# Patient Record
Sex: Female | Born: 1950
Health system: Southern US, Community
[De-identification: ages and names within clinical notes are randomized; demographics above are authoritative.]

## PROBLEM LIST (undated history)

## (undated) DIAGNOSIS — E1142 Type 2 diabetes mellitus with diabetic polyneuropathy: Secondary | ICD-10-CM

## (undated) DIAGNOSIS — R609 Edema, unspecified: Secondary | ICD-10-CM

## (undated) DIAGNOSIS — R269 Unspecified abnormalities of gait and mobility: Secondary | ICD-10-CM

## (undated) DIAGNOSIS — N189 Chronic kidney disease, unspecified: Secondary | ICD-10-CM

## (undated) DIAGNOSIS — K148 Other diseases of tongue: Secondary | ICD-10-CM

## (undated) DIAGNOSIS — E11319 Type 2 diabetes mellitus with unspecified diabetic retinopathy without macular edema: Secondary | ICD-10-CM

## (undated) DIAGNOSIS — M21371 Foot drop, right foot: Secondary | ICD-10-CM

## (undated) DIAGNOSIS — R600 Localized edema: Secondary | ICD-10-CM

## (undated) DIAGNOSIS — G4730 Sleep apnea, unspecified: Secondary | ICD-10-CM

## (undated) DIAGNOSIS — M21372 Foot drop, left foot: Secondary | ICD-10-CM

## (undated) DIAGNOSIS — T884XXA Failed or difficult intubation, initial encounter: Secondary | ICD-10-CM

## (undated) DIAGNOSIS — K219 Gastro-esophageal reflux disease without esophagitis: Secondary | ICD-10-CM

## (undated) DIAGNOSIS — E119 Type 2 diabetes mellitus without complications: Secondary | ICD-10-CM

## (undated) HISTORY — DX: Foot drop, left foot: M21.372

## (undated) HISTORY — PX: HIP SURGERY: SHX245

## (undated) HISTORY — DX: Foot drop, right foot: M21.371

## (undated) HISTORY — DX: Type 2 diabetes mellitus with diabetic polyneuropathy: E11.42

## (undated) HISTORY — PX: ANKLE FRACTURE SURGERY: SHX122

## (undated) HISTORY — DX: Type 2 diabetes mellitus without complications: E11.9

## (undated) HISTORY — DX: Edema, unspecified: R60.9

## (undated) HISTORY — DX: Type 2 diabetes mellitus with unspecified diabetic retinopathy without macular edema: E11.319

## (undated) HISTORY — DX: Unspecified abnormalities of gait and mobility: R26.9

## (undated) HISTORY — PX: TONSILLECTOMY: SUR1361

## (undated) HISTORY — PX: CATARACT EXTRACTION, BILATERAL: SHX1313

## (undated) HISTORY — PX: ABDOMINAL HYSTERECTOMY: SHX81

## (undated) HISTORY — DX: Localized edema: R60.0

---

## 2001-04-15 ENCOUNTER — Other Ambulatory Visit: Admission: RE | Admit: 2001-04-15 | Discharge: 2001-04-15 | Payer: Self-pay | Admitting: Gynecology

## 2001-06-29 ENCOUNTER — Encounter: Admission: RE | Admit: 2001-06-29 | Discharge: 2001-09-27 | Payer: Self-pay | Admitting: *Deleted

## 2002-09-28 ENCOUNTER — Other Ambulatory Visit: Admission: RE | Admit: 2002-09-28 | Discharge: 2002-09-28 | Payer: Self-pay | Admitting: Gynecology

## 2003-09-26 ENCOUNTER — Other Ambulatory Visit: Admission: RE | Admit: 2003-09-26 | Discharge: 2003-09-26 | Payer: Self-pay | Admitting: Gynecology

## 2003-10-20 ENCOUNTER — Encounter: Admission: RE | Admit: 2003-10-20 | Discharge: 2003-10-20 | Payer: Self-pay | Admitting: Gynecology

## 2004-10-31 ENCOUNTER — Other Ambulatory Visit: Admission: RE | Admit: 2004-10-31 | Discharge: 2004-10-31 | Payer: Self-pay | Admitting: Obstetrics and Gynecology

## 2004-11-13 ENCOUNTER — Encounter: Admission: RE | Admit: 2004-11-13 | Discharge: 2004-11-13 | Payer: Self-pay | Admitting: Obstetrics and Gynecology

## 2005-10-24 ENCOUNTER — Inpatient Hospital Stay (HOSPITAL_COMMUNITY): Admission: EM | Admit: 2005-10-24 | Discharge: 2005-10-27 | Payer: Self-pay | Admitting: Emergency Medicine

## 2006-02-13 ENCOUNTER — Other Ambulatory Visit: Admission: RE | Admit: 2006-02-13 | Discharge: 2006-02-13 | Payer: Self-pay | Admitting: Obstetrics and Gynecology

## 2010-04-04 ENCOUNTER — Encounter: Admission: RE | Admit: 2010-04-04 | Discharge: 2010-04-04 | Payer: Self-pay | Admitting: Obstetrics and Gynecology

## 2010-04-23 ENCOUNTER — Encounter: Admission: RE | Admit: 2010-04-23 | Discharge: 2010-04-23 | Payer: Self-pay | Admitting: Internal Medicine

## 2010-09-07 ENCOUNTER — Encounter: Payer: Self-pay | Admitting: Gynecology

## 2011-02-11 ENCOUNTER — Inpatient Hospital Stay (HOSPITAL_COMMUNITY)
Admission: EM | Admit: 2011-02-11 | Discharge: 2011-02-18 | DRG: 818 | Disposition: A | Discharge status: Home Health | Payer: BC Managed Care – PPO | Source: Ambulatory Visit | Attending: Orthopaedic Surgery | Admitting: Orthopaedic Surgery

## 2011-02-11 ENCOUNTER — Emergency Department (HOSPITAL_COMMUNITY): Payer: BC Managed Care – PPO

## 2011-02-11 ENCOUNTER — Inpatient Hospital Stay (HOSPITAL_COMMUNITY): Payer: BC Managed Care – PPO

## 2011-02-11 DIAGNOSIS — Y990 Civilian activity done for income or pay: Secondary | ICD-10-CM

## 2011-02-11 DIAGNOSIS — S72033A Displaced midcervical fracture of unspecified femur, initial encounter for closed fracture: Principal | ICD-10-CM | POA: Diagnosis present

## 2011-02-11 DIAGNOSIS — E1149 Type 2 diabetes mellitus with other diabetic neurological complication: Secondary | ICD-10-CM | POA: Diagnosis present

## 2011-02-11 DIAGNOSIS — E1142 Type 2 diabetes mellitus with diabetic polyneuropathy: Secondary | ICD-10-CM | POA: Diagnosis present

## 2011-02-11 DIAGNOSIS — W010XXA Fall on same level from slipping, tripping and stumbling without subsequent striking against object, initial encounter: Secondary | ICD-10-CM | POA: Diagnosis present

## 2011-02-11 DIAGNOSIS — K219 Gastro-esophageal reflux disease without esophagitis: Secondary | ICD-10-CM | POA: Diagnosis present

## 2011-02-11 DIAGNOSIS — Y9269 Other specified industrial and construction area as the place of occurrence of the external cause: Secondary | ICD-10-CM

## 2011-02-11 LAB — DIFFERENTIAL
Basophils Absolute: 0.1 10*3/uL (ref 0.0–0.1)
Basophils Relative: 0 % (ref 0–1)
Eosinophils Absolute: 0.2 10*3/uL (ref 0.0–0.7)
Eosinophils Relative: 2 % (ref 0–5)
Lymphocytes Relative: 10 % — ABNORMAL LOW (ref 12–46)
Lymphs Abs: 1.4 10*3/uL (ref 0.7–4.0)
Monocytes Absolute: 0.8 10*3/uL (ref 0.1–1.0)
Monocytes Relative: 6 % (ref 3–12)
Neutro Abs: 11.4 10*3/uL — ABNORMAL HIGH (ref 1.7–7.7)
Neutrophils Relative %: 82 % — ABNORMAL HIGH (ref 43–77)

## 2011-02-11 LAB — CBC
HCT: 39.4 % (ref 36.0–46.0)
Hemoglobin: 13.7 g/dL (ref 12.0–15.0)
MCH: 31.7 pg (ref 26.0–34.0)
MCHC: 34.8 g/dL (ref 30.0–36.0)
MCV: 91.2 fL (ref 78.0–100.0)
Platelets: 331 10*3/uL (ref 150–400)
RBC: 4.32 MIL/uL (ref 3.87–5.11)
RDW: 12.6 % (ref 11.5–15.5)
WBC: 13.8 10*3/uL — ABNORMAL HIGH (ref 4.0–10.5)

## 2011-02-11 LAB — BASIC METABOLIC PANEL
BUN: 18 mg/dL (ref 6–23)
CO2: 27 mEq/L (ref 19–32)
Calcium: 9.2 mg/dL (ref 8.4–10.5)
Chloride: 100 mEq/L (ref 96–112)
Creatinine, Ser: 0.62 mg/dL (ref 0.50–1.10)
GFR calc Af Amer: >60 mL/min (ref >60)
GFR calc non Af Amer: >60 mL/min (ref >60)
Glucose, Bld: 182 mg/dL — ABNORMAL HIGH (ref 70–99)
Potassium: 4.7 mEq/L (ref 3.5–5.1)
Sodium: 136 mEq/L (ref 135–145)

## 2011-02-11 LAB — GLUCOSE, CAPILLARY
Glucose-Capillary: 132 mg/dL — ABNORMAL HIGH (ref 70–99)
Glucose-Capillary: 108 mg/dL — ABNORMAL HIGH (ref 70–99)
Glucose-Capillary: 166 mg/dL — ABNORMAL HIGH (ref 70–99)

## 2011-02-11 LAB — SAMPLE TO BLOOD BANK

## 2011-02-12 ENCOUNTER — Inpatient Hospital Stay (HOSPITAL_COMMUNITY): Payer: BC Managed Care – PPO

## 2011-02-12 LAB — BASIC METABOLIC PANEL
BUN: 15 mg/dL (ref 6–23)
CO2: 30 mEq/L (ref 19–32)
Calcium: 8.4 mg/dL (ref 8.4–10.5)
Chloride: 104 mEq/L (ref 96–112)
Creatinine, Ser: 0.75 mg/dL (ref 0.50–1.10)
GFR calc Af Amer: >60 mL/min (ref >60)
GFR calc non Af Amer: >60 mL/min (ref >60)
Glucose, Bld: 237 mg/dL — ABNORMAL HIGH (ref 70–99)
Potassium: 5.7 mEq/L — ABNORMAL HIGH (ref 3.5–5.1)
Sodium: 141 mEq/L (ref 135–145)

## 2011-02-12 LAB — GLUCOSE, CAPILLARY
Glucose-Capillary: 154 mg/dL — ABNORMAL HIGH (ref 70–99)
Glucose-Capillary: 192 mg/dL — ABNORMAL HIGH (ref 70–99)
Glucose-Capillary: 195 mg/dL — ABNORMAL HIGH (ref 70–99)
Glucose-Capillary: 203 mg/dL — ABNORMAL HIGH (ref 70–99)
Glucose-Capillary: 230 mg/dL — ABNORMAL HIGH (ref 70–99)

## 2011-02-12 LAB — CBC
HCT: 32 % — ABNORMAL LOW (ref 36.0–46.0)
Hemoglobin: 10.8 g/dL — ABNORMAL LOW (ref 12.0–15.0)
MCH: 31.4 pg (ref 26.0–34.0)
MCHC: 33.8 g/dL (ref 30.0–36.0)
MCV: 93 fL (ref 78.0–100.0)
Platelets: 283 10*3/uL (ref 150–400)
RBC: 3.44 MIL/uL — ABNORMAL LOW (ref 3.87–5.11)
RDW: 12.6 % (ref 11.5–15.5)
WBC: 13.5 10*3/uL — ABNORMAL HIGH (ref 4.0–10.5)

## 2011-02-12 LAB — PROTIME-INR
INR: 1.04 (ref 0.00–1.49)
Prothrombin Time: 13.8 seconds (ref 11.6–15.2)

## 2011-02-13 DIAGNOSIS — S72009A Fracture of unspecified part of neck of unspecified femur, initial encounter for closed fracture: Secondary | ICD-10-CM

## 2011-02-13 LAB — BASIC METABOLIC PANEL
BUN: 9 mg/dL (ref 6–23)
CO2: 29 mEq/L (ref 19–32)
Calcium: 7.4 mg/dL — ABNORMAL LOW (ref 8.4–10.5)
Chloride: 98 mEq/L (ref 96–112)
Creatinine, Ser: 0.63 mg/dL (ref 0.50–1.10)
GFR calc Af Amer: >60 mL/min (ref >60)
GFR calc non Af Amer: >60 mL/min (ref >60)
Glucose, Bld: 153 mg/dL — ABNORMAL HIGH (ref 70–99)
Potassium: 4.1 mEq/L (ref 3.5–5.1)
Sodium: 134 mEq/L — ABNORMAL LOW (ref 135–145)

## 2011-02-13 LAB — GLUCOSE, CAPILLARY
Glucose-Capillary: 187 mg/dL — ABNORMAL HIGH (ref 70–99)
Glucose-Capillary: 251 mg/dL — ABNORMAL HIGH (ref 70–99)
Glucose-Capillary: 226 mg/dL — ABNORMAL HIGH (ref 70–99)
Glucose-Capillary: 238 mg/dL — ABNORMAL HIGH (ref 70–99)

## 2011-02-13 LAB — CBC
HCT: 29.4 % — ABNORMAL LOW (ref 36.0–46.0)
Hemoglobin: 9.8 g/dL — ABNORMAL LOW (ref 12.0–15.0)
MCH: 30.6 pg (ref 26.0–34.0)
MCHC: 33.3 g/dL (ref 30.0–36.0)
MCV: 91.9 fL (ref 78.0–100.0)
Platelets: 264 10*3/uL (ref 150–400)
RBC: 3.2 MIL/uL — ABNORMAL LOW (ref 3.87–5.11)
RDW: 12.5 % (ref 11.5–15.5)
WBC: 10.7 10*3/uL — ABNORMAL HIGH (ref 4.0–10.5)

## 2011-02-13 LAB — PROTIME-INR
INR: 1.07 (ref 0.00–1.49)
Prothrombin Time: 14.1 seconds (ref 11.6–15.2)

## 2011-02-14 LAB — GLUCOSE, CAPILLARY
Glucose-Capillary: 184 mg/dL — ABNORMAL HIGH (ref 70–99)
Glucose-Capillary: 157 mg/dL — ABNORMAL HIGH (ref 70–99)
Glucose-Capillary: 132 mg/dL — ABNORMAL HIGH (ref 70–99)
Glucose-Capillary: 163 mg/dL — ABNORMAL HIGH (ref 70–99)
Glucose-Capillary: 139 mg/dL — ABNORMAL HIGH (ref 70–99)

## 2011-02-14 LAB — PROTIME-INR
INR: 1.21 (ref 0.00–1.49)
Prothrombin Time: 15.6 seconds — ABNORMAL HIGH (ref 11.6–15.2)

## 2011-02-15 LAB — GLUCOSE, CAPILLARY
Glucose-Capillary: 139 mg/dL — ABNORMAL HIGH (ref 70–99)
Glucose-Capillary: 118 mg/dL — ABNORMAL HIGH (ref 70–99)
Glucose-Capillary: 140 mg/dL — ABNORMAL HIGH (ref 70–99)
Glucose-Capillary: 164 mg/dL — ABNORMAL HIGH (ref 70–99)

## 2011-02-15 LAB — PROTIME-INR
INR: 1.44 (ref 0.00–1.49)
Prothrombin Time: 17.8 seconds — ABNORMAL HIGH (ref 11.6–15.2)

## 2011-02-15 NOTE — Op Note (Signed)
Grace [name], Grace [name]            ACCOUNT NO.:  [id number]  MEDICAL RECORD NO.:  [id number]  LOCATION:  5034                         FACILITY:  [facility]  PHYSICIAN:  Vanita Panda. Magnus Ivan, M.D.DATE OF BIRTH:  April 14, 1951  DATE OF PROCEDURE:  02/11/2011 DATE OF DISCHARGE:                              OPERATIVE REPORT   PREOPERATIVE DIAGNOSIS:  Right hip femoral neck fracture.  POSTOPERATIVE DIAGNOSIS:  Right hip femoral neck fracture.  PROCEDURE:  Right hip unipolar hemiarthroplasty.  IMPLANTS:  DePuy Summit Basic Press-Fit stem size 3, size 43, -3 hip ball.  SURGEON:  Vanita Panda. Magnus Ivan, MD  ANESTHESIA:  General.  ANTIBIOTICS:  2 g IV Ancef.  BLOOD LOSS:  300 mL.  COMPLICATIONS:  None.  INDICATIONS:  [name] is a 60 year old patient with diabetes and peripheral neuropathy, this is quite severe.  She ambulates with the walker.  Today, she had some insensate aspect of issues with her feet, but was ambulating with the walker and sustained a mechanical fall when she tripped accidently.  She had the inability to bear weight and was brought to the [facility] emergency room and found to have a femoral neck fracture of her right hip.  I recommend that she undergo a hemiarthroplasty.  She had had no prior hip disease at all and no hip pain, and I felt with her such a low mobility with her severe neuropathy and fall risk that we ought to try a hemiarthroplasty instead.  I explained the risk and benefits of this to her in detail and she did wish to proceed with surgery.  PROCEDURE DESCRIPTION:  After informed consent was obtained, appropriate right hip was marked.  She was brought to the operating room, placed supine on the operating table.  General anesthesia was then obtained. She was then turned into a lateral decubitus position with the right hip up.  An axillary roll was placed __________ and hip positioners in the front and the back.  Her right hip was then  prepped and draped with DuraPrep and sterile drapes.  A time-out was called and she was identified as the correct patient and the correct right hip.  I then made the incision directly over the greater trochanter and carried this proximally and distally.  I dissected down to the iliotibial band and divided this longitudinally and Charnley retractors were placed, and then I proceed with an anterolateral approach.  I took down two thirds of the gluteus medius and minimus tendons off of the greater trochanter and reflected these anteriorly.  I divided the hip capsule and found a femoral neck fracture.  I then made a femoral neck cut proximal to the lesser trochanter, and then I removed remnants of the neck and then head without difficulty.  I cleaned the acetabulum of debris, and then I trialed a size 43 head and a 44.  The 44 was too loose and the 43 was nice and snug.  Attention was then turned to the femur.  With hip flexed and externally rotated and brought off the front of the table into leg bag, I used the initiating reamer to enter the canal, the canal-finding guide and then a  lateralizing reamer.  I then  broached from a size 1 broach up to size 3.  The calcar planed off a 3 broach and then I trialed a size 43, -3 head due to difficulty with reducing a length higher.  I reduced this into the acetabulum and I thought her leg lengths felt  near equal.  The hip was stable with no shuck.  I then removed the trial instrumentation and replaced with the real Summit Press-Fit 3 stem with a collar and the real unipolar 43, -3 head.  We reduced this in acetabulum as well.  I copiously the irrigated the tissues and closed the joint capsule with interrupted #1 Ethibond suture, followed by reapproximating the gluteus and medius to the greater trochanter with #1 Ethibond.  I closed iliotibial band with interrupted #1 Vicryl followed by 2-0 Vicryl in subcutaneous tissue and staples on the skin,  Xeroform and Mepilex dressing.  The patient was rolled into supine position.  Her leg lengths were near equal.  She was awakened, extubated, and taken to recovery room in stable condition. All final counts were correct and there were no complications noted.     Vanita Panda. Magnus Ivan, M.D.     CYB/MEDQ  D:  02/11/2011  T:  02/12/2011  Job:  [id number]  Electronically Signed by Doneen Poisson M.D. on 02/15/2011 05:11:00 PM

## 2011-02-16 LAB — GLUCOSE, CAPILLARY
Glucose-Capillary: 216 mg/dL — ABNORMAL HIGH (ref 70–99)
Glucose-Capillary: 152 mg/dL — ABNORMAL HIGH (ref 70–99)
Glucose-Capillary: 75 mg/dL (ref 70–99)
Glucose-Capillary: 96 mg/dL (ref 70–99)

## 2011-02-16 LAB — PROTIME-INR
INR: 1.93 — ABNORMAL HIGH (ref 0.00–1.49)
Prothrombin Time: 22.4 seconds — ABNORMAL HIGH (ref 11.6–15.2)

## 2011-02-17 LAB — GLUCOSE, CAPILLARY
Glucose-Capillary: 93 mg/dL (ref 70–99)
Glucose-Capillary: 98 mg/dL (ref 70–99)
Glucose-Capillary: 62 mg/dL — ABNORMAL LOW (ref 70–99)
Glucose-Capillary: 174 mg/dL — ABNORMAL HIGH (ref 70–99)

## 2011-02-17 LAB — PROTIME-INR
INR: 4.45 — ABNORMAL HIGH (ref 0.00–1.49)
Prothrombin Time: 43 seconds — ABNORMAL HIGH (ref 11.6–15.2)

## 2011-02-18 LAB — GLUCOSE, CAPILLARY
Glucose-Capillary: 121 mg/dL — ABNORMAL HIGH (ref 70–99)
Glucose-Capillary: 149 mg/dL — ABNORMAL HIGH (ref 70–99)

## 2011-02-18 LAB — PROTIME-INR
INR: 4.97 — ABNORMAL HIGH (ref 0.00–1.49)
Prothrombin Time: 46.9 seconds — ABNORMAL HIGH (ref 11.6–15.2)

## 2011-03-06 NOTE — Discharge Summary (Signed)
  Grace [name], Grace [name]            ACCOUNT NO.:  [id number]  MEDICAL RECORD NO.:  [id number]  LOCATION:  5034                         FACILITY:  [facility]  PHYSICIAN:  Vanita Panda. Magnus Ivan, M.D.DATE OF BIRTH:  1950/09/08  DATE OF ADMISSION:  02/11/2011 DATE OF DISCHARGE:  02/18/2011                              DISCHARGE SUMMARY   ADMITTING DIAGNOSIS:  Right hip femoral neck fracture.  DISCHARGE DIAGNOSIS:  Status post right hip hemiarthroplasty.  HOSPITAL COURSE:  Grace [name] is a patient with peripheral neuropathy who on the day of admission had a mechanical fall when she tripped over a walker.  She was found to have a right hip femoral neck fracture.  She was admitted to the [facility]opedic [facility] and taken to the operating room on February 11, 2011, where she underwent a right hip hemiarthroplasty.  She was admitted as an inpatient to Orthopedic Floor and postoperative course was uneventful other than very slow mobility. We worked on trying to get her set up for inpatient rehabilitation to the hospital, but her insurance wound not clear her for this.  They recommended skilled nursing facility, but by the day of discharge the family wanted to be able to set her up for home health therapy to discharge her to home.  On the day of discharge, her incision was clean, dry, and intact.  She was afebrile with stable vital signs and it was felt she could go home with family with close assistance.  DISPOSITION:  Discharge to home.  DISCHARGE MEDICATIONS: 1. Percocet for pain. 2. Robaxin. 3. Coumadin. 4. Continue all same home medicines on home medical reconciliation     notes.  DISCHARGE INSTRUCTIONS:  While she is at home, she will get home health PT and OT and follow up in the office in 2 weeks.  She can get her incision wet in the shower.     Vanita Panda. Magnus Ivan, M.D.     CYB/MEDQ  D:  03/02/2011  T:  03/03/2011  Job:  [id number]  Electronically Signed by  Doneen Poisson M.D. on 03/06/2011 05:26:46 PM

## 2012-04-30 ENCOUNTER — Other Ambulatory Visit: Payer: Self-pay | Admitting: Internal Medicine

## 2012-04-30 DIAGNOSIS — Z1231 Encounter for screening mammogram for malignant neoplasm of breast: Secondary | ICD-10-CM

## 2012-05-26 ENCOUNTER — Ambulatory Visit
Admission: RE | Admit: 2012-05-26 | Discharge: 2012-05-26 | Disposition: A | Discharge status: Home / Self Care | Payer: BC Managed Care – PPO | Source: Ambulatory Visit | Attending: Internal Medicine | Admitting: Internal Medicine

## 2012-05-26 DIAGNOSIS — Z1231 Encounter for screening mammogram for malignant neoplasm of breast: Secondary | ICD-10-CM

## 2013-06-04 ENCOUNTER — Other Ambulatory Visit: Payer: Self-pay | Admitting: Neurology

## 2013-06-07 ENCOUNTER — Other Ambulatory Visit: Payer: Self-pay | Admitting: Neurology

## 2013-06-08 NOTE — Telephone Encounter (Signed)
DULoxetine (CYMBALTA) 30 MG capsule 30 capsule 0 06/04/2013     Sig: TAKE 1 CAPSULE EVERY DAY    Notes to Pharmacy: PLEASE SCHEDULE APPT    E-Prescribing Status: Receipt confirmed by pharmacy (06/07/2013 7:46 AM EDT)                Pharmacy    [id number] - [place],  - [address]

## 2013-06-09 ENCOUNTER — Other Ambulatory Visit: Payer: Self-pay | Admitting: Neurology

## 2013-07-20 ENCOUNTER — Encounter: Payer: Self-pay | Admitting: Neurology

## 2013-07-28 ENCOUNTER — Encounter (INDEPENDENT_AMBULATORY_CARE_PROVIDER_SITE_OTHER): Payer: Self-pay

## 2013-07-28 ENCOUNTER — Ambulatory Visit (INDEPENDENT_AMBULATORY_CARE_PROVIDER_SITE_OTHER): Payer: Medicare PPO | Admitting: Neurology

## 2013-07-28 ENCOUNTER — Encounter: Payer: Self-pay | Admitting: Neurology

## 2013-07-28 VITALS — BP 135/66 | HR 72 | Wt 183.5 lb

## 2013-07-28 DIAGNOSIS — R269 Unspecified abnormalities of gait and mobility: Secondary | ICD-10-CM

## 2013-07-28 DIAGNOSIS — E1142 Type 2 diabetes mellitus with diabetic polyneuropathy: Secondary | ICD-10-CM

## 2013-07-28 HISTORY — DX: Type 2 diabetes mellitus with diabetic polyneuropathy: E11.42

## 2013-07-28 HISTORY — DX: Unspecified abnormalities of gait and mobility: R26.9

## 2013-07-28 MED ORDER — DULOXETINE HCL 30 MG PO CPEP: 30.0000 mg | ORAL_CAPSULE | Freq: Every day | ORAL | Status: DC

## 2013-07-28 NOTE — Patient Instructions (Signed)

## 2013-07-28 NOTE — Progress Notes (Signed)
Reason for visit: Peripheral neuropathy  Grace [name] is an 62 y.o. female  History of present illness:  Grace [name] is a 62 year old right-handed white female with a history of diabetes and a diabetic peripheral neuropathy. The patient has bilateral foot drops, and she has bilateral AFO braces that have been beneficial in helping her ability to walk and helping her balance. The patient has not had any issues with falling, and she denies any significant problems with pain. The patient is on low-dose Cymbalta taking 30 mg daily. The patient recently has been treated for a diabetic retinopathy, and she is getting laser treatments for this. The patient has not been able to operate a motor vehicle regularly because of her vision, not because of her neuropathy. The patient does have some fatigue with walking at times. The patient indicates that she is sleeping well, and she is not having a lot of pain with her feet at night. The patient senses some numbness up to the knees bilaterally. The patient will occasionally have some hand numbness. The patient returns to the office today for an evaluation.  Past Medical History  Diagnosis Date  . Diabetes   . Peripheral edema   . Diabetic peripheral neuropathy   . Diabetic retinopathy   . Polyneuropathy in diabetes(357.2) 07/28/2013  . Abnormality of gait 07/28/2013    Past Surgical History  Procedure Laterality Date  . Tonsillectomy    . Abdominal hysterectomy    . Hip surgery      right  . Ankle fracture surgery      left    Family History  Problem Relation Age of Onset  . Diabetes Mother   . Diabetes Brother   . Neuropathy Neg Hx     Social history:  reports that she has never smoked. She has never used smokeless tobacco. She reports that she does not drink alcohol or use illicit drugs.   No Known Allergies  Medications:  Current Outpatient Prescriptions on File Prior to Visit  Medication Sig Dispense Refill  .  Cholecalciferol (VITAMIN D-3) 1000 UNITS CAPS Take by mouth.      Marland Kitchen glimepiride (AMARYL) 2 MG tablet Take 2 mg by mouth daily with breakfast.      . metFORMIN (GLUCOPHAGE) 500 MG tablet Take by mouth 2 (two) times daily with a meal.      . docusate sodium (COLACE) 100 MG capsule Take 100 mg by mouth 2 (two) times daily.       No current facility-administered medications on file prior to visit.    ROS:  Out of a complete 14 system review of symptoms, the patient complains only of the following symptoms, and all other reviewed systems are negative.  Light sensitivity Loss of vision Snoring  Blood pressure 135/66, pulse 72, weight 183 lb 8 oz (83.235 kg).  Physical Exam  General: The patient is alert and cooperative at the time of the examination. The patient is moderately obese.  Skin: No significant peripheral edema is noted.   Neurologic Exam  Mental status: The patient is oriented x 3.  Cranial nerves: Facial symmetry is present. Speech is normal, no aphasia or dysarthria is noted. Extraocular movements are full. Visual fields are full.  Motor: The patient has good strength in all 4 extremities, with exception that the patient has bilateral foot drops. The patient wears AFO braces bilaterally.  Sensory examination: Soft touch sensation on the arms and face is symmetric.  Coordination: The patient has good  finger-nose-finger and heel-to-shin bilaterally.  Gait and station: The patient has a slightly wide-based gait, the patient will slap the right foot some with walking. Tandem gait is unsteady. Romberg is negative. No drift is seen.  Reflexes: Deep tendon reflexes are symmetric, but are depressed.   Assessment/Plan:  One. Diabetic peripheral neuropathy  2. Gait disturbance  The patient is doing relatively well at this point, we will continue the Cymbalta for now, a prescription was written. The patient will followup in one year. The patient will contact our office if  needed.  Grace Palau MD 07/30/2013 9:49 AM  [facility] [address] [place], [place] [address]  Phone [phone] Fax [phone]

## 2013-12-09 ENCOUNTER — Other Ambulatory Visit: Payer: Self-pay | Admitting: Internal Medicine

## 2013-12-09 DIAGNOSIS — R0989 Other specified symptoms and signs involving the circulatory and respiratory systems: Secondary | ICD-10-CM

## 2013-12-13 ENCOUNTER — Encounter (INDEPENDENT_AMBULATORY_CARE_PROVIDER_SITE_OTHER): Payer: Self-pay

## 2013-12-13 ENCOUNTER — Ambulatory Visit
Admission: RE | Admit: 2013-12-13 | Discharge: 2013-12-13 | Disposition: A | Discharge status: Home / Self Care | Payer: Medicare HMO | Source: Ambulatory Visit | Attending: Internal Medicine | Admitting: Internal Medicine

## 2013-12-13 DIAGNOSIS — R0989 Other specified symptoms and signs involving the circulatory and respiratory systems: Secondary | ICD-10-CM

## 2014-02-21 HISTORY — PX: CATARACT EXTRACTION W/PHACO: SHX586

## 2014-04-10 HISTORY — PX: CATARACT EXTRACTION W/PHACO: SHX586

## 2014-07-31 ENCOUNTER — Ambulatory Visit (INDEPENDENT_AMBULATORY_CARE_PROVIDER_SITE_OTHER): Payer: Medicare PPO | Admitting: Neurology

## 2014-07-31 ENCOUNTER — Encounter: Payer: Self-pay | Admitting: Neurology

## 2014-07-31 VITALS — BP 128/62 | HR 71 | Ht 63.0 in | Wt 171.4 lb

## 2014-07-31 DIAGNOSIS — E1142 Type 2 diabetes mellitus with diabetic polyneuropathy: Secondary | ICD-10-CM

## 2014-07-31 DIAGNOSIS — M21372 Foot drop, left foot: Secondary | ICD-10-CM

## 2014-07-31 DIAGNOSIS — R269 Unspecified abnormalities of gait and mobility: Secondary | ICD-10-CM

## 2014-07-31 DIAGNOSIS — M21371 Foot drop, right foot: Secondary | ICD-10-CM

## 2014-07-31 HISTORY — DX: Foot drop, right foot: M21.371

## 2014-07-31 HISTORY — DX: Foot drop, right foot: M21.372

## 2014-07-31 MED ORDER — DULOXETINE HCL 30 MG PO CPEP
30.0000 mg | ORAL_CAPSULE | Freq: Every day | ORAL | Status: DC
Start: 2014-07-31 — End: 2014-08-30

## 2014-07-31 NOTE — Progress Notes (Signed)
Reason for visit: Diabetic peripheral neuropathy  Grace [name] is an 63 y.o. female  History of present illness:  Ms. Efaw is a 63 year old right-handed white female with a history of diabetes with a diabetic peripheral neuropathy. The patient has done well with her balance, she has not had any falls since last seen. She has bilateral foot drops, and she wears AFO braces bilaterally. The patient indicates that her blood sugars recently have been under good control. She is changing primary care physicians. The patient has not noted any new medical issues that have come up since last seen. She is on a low dose of Cymbalta, taking 30 mg daily. She indicates that the pain level is under good control on this dose. She is tolerating the Cymbalta well.  Past Medical History  Diagnosis Date  . Diabetes   . Peripheral edema   . Diabetic peripheral neuropathy   . Diabetic retinopathy   . Polyneuropathy in diabetes(357.2) 07/28/2013  . Abnormality of gait 07/28/2013  . Foot drop, bilateral 07/31/2014    Past Surgical History  Procedure Laterality Date  . Tonsillectomy    . Abdominal hysterectomy    . Hip surgery      right  . Ankle fracture surgery      left  . Cataract extraction, bilateral      Family History  Problem Relation Age of Onset  . Diabetes Mother   . Diabetes Brother   . Neuropathy Neg Hx     Social history:  reports that she has never smoked. She has never used smokeless tobacco. She reports that she does not drink alcohol or use illicit drugs.    Allergies  Allergen Reactions  . Gabapentin Swelling    Swelling in legs and feet    Medications:  Current Outpatient Prescriptions on File Prior to Visit  Medication Sig Dispense Refill  . glimepiride (AMARYL) 2 MG tablet Take 2 mg by mouth daily with breakfast.    . metFORMIN (GLUCOPHAGE) 500 MG tablet Take by mouth 2 (two) times daily with a meal.     No current facility-administered medications on  file prior to visit.    ROS:  Out of a complete 14 system review of symptoms, the patient complains only of the following symptoms, and all other reviewed systems are negative.  Loss of vision Muscle cramps, walking difficulties Numbness  Blood pressure 128/62, pulse 71, height 5\' 3"  (1.6 m), weight 171 lb 6.4 oz (77.747 kg).  Physical Exam  General: The patient is alert and cooperative at the time of the examination. The patient is moderately obese.  Skin: No significant peripheral edema is noted.   Neurologic Exam  Mental status: The patient is oriented x 3.  Cranial nerves: Facial symmetry is present. Speech is normal, no aphasia or dysarthria is noted. Extraocular movements are full. Visual fields are full.  Motor: The patient has good strength in all 4 extremities.  Sensory examination: Soft touch sensation on arms and face is symmetric.  Coordination: The patient has good finger-nose-finger and heel-to-shin bilaterally.  Gait and station: The patient has a normal gait. Tandem gait is slightly unsteady. Romberg is negative. No drift is seen.  Reflexes: Deep tendon reflexes are symmetric, but are depressed.   Assessment/Plan:  1. Diabetes  2. Diabetic peripheral neuropathy  3. Bilateral foot drops  4. Gait disorder  The patient is well controlled with the diabetic neuropathy pain on Cymbalta in low dose. The patient will continue  her current medication regimen, a prescription was written, and the patient will follow-up in one year.   Jill Alexanders MD 07/31/2014 7:56 PM  [facility] [address] [place], [place] [address]  Phone [phone] Fax [phone]

## 2014-07-31 NOTE — Patient Instructions (Signed)

## 2014-08-30 ENCOUNTER — Ambulatory Visit (INDEPENDENT_AMBULATORY_CARE_PROVIDER_SITE_OTHER): Payer: Medicare PPO | Admitting: Internal Medicine

## 2014-08-30 ENCOUNTER — Encounter: Payer: Self-pay | Admitting: Internal Medicine

## 2014-08-30 ENCOUNTER — Other Ambulatory Visit: Payer: Self-pay | Admitting: Internal Medicine

## 2014-08-30 VITALS — BP 136/76 | HR 68 | Temp 97.7°F | Resp 16 | Ht 65.0 in | Wt 171.6 lb

## 2014-08-30 DIAGNOSIS — I10 Essential (primary) hypertension: Secondary | ICD-10-CM | POA: Insufficient documentation

## 2014-08-30 DIAGNOSIS — E1142 Type 2 diabetes mellitus with diabetic polyneuropathy: Secondary | ICD-10-CM

## 2014-08-30 DIAGNOSIS — E0842 Diabetes mellitus due to underlying condition with diabetic polyneuropathy: Secondary | ICD-10-CM

## 2014-08-30 DIAGNOSIS — E782 Mixed hyperlipidemia: Secondary | ICD-10-CM | POA: Insufficient documentation

## 2014-08-30 DIAGNOSIS — Z79899 Other long term (current) drug therapy: Secondary | ICD-10-CM | POA: Insufficient documentation

## 2014-08-30 DIAGNOSIS — E119 Type 2 diabetes mellitus without complications: Secondary | ICD-10-CM

## 2014-08-30 DIAGNOSIS — E559 Vitamin D deficiency, unspecified: Secondary | ICD-10-CM | POA: Insufficient documentation

## 2014-08-30 LAB — BASIC METABOLIC PANEL WITH GFR
BUN: 17 mg/dL (ref 6–23)
CO2: 30 mEq/L (ref 19–32)
Calcium: 9.9 mg/dL (ref 8.4–10.5)
Chloride: 102 mEq/L (ref 96–112)
Creat: 0.74 mg/dL (ref 0.50–1.10)
GFR, Est African American: >89 mL/min
GFR, Est Non African American: 86 mL/min
Glucose, Bld: 69 mg/dL — ABNORMAL LOW (ref 70–99)
Potassium: 4.6 mEq/L (ref 3.5–5.3)
Sodium: 141 mEq/L (ref 135–145)

## 2014-08-30 LAB — LIPID PANEL
Cholesterol: 193 mg/dL (ref 0–200)
HDL: 50 mg/dL (ref >39)
LDL Cholesterol: 100 mg/dL — ABNORMAL HIGH (ref 0–99)
Total CHOL/HDL Ratio: 3.9 Ratio
Triglycerides: 214 mg/dL — ABNORMAL HIGH (ref <150)
VLDL: 43 mg/dL — ABNORMAL HIGH (ref 0–40)

## 2014-08-30 LAB — MAGNESIUM: Magnesium: 1.4 mg/dL — ABNORMAL LOW (ref 1.5–2.5)

## 2014-08-30 LAB — HEPATIC FUNCTION PANEL
ALT: 11 U/L (ref 0–35)
AST: 14 U/L (ref 0–37)
Albumin: 3.9 g/dL (ref 3.5–5.2)
Alkaline Phosphatase: 62 U/L (ref 39–117)
Bilirubin, Direct: 0.1 mg/dL (ref 0.0–0.3)
Indirect Bilirubin: 0.2 mg/dL (ref 0.2–1.2)
Total Bilirubin: 0.3 mg/dL (ref 0.2–1.2)
Total Protein: 6.6 g/dL (ref 6.0–8.3)

## 2014-08-30 MED ORDER — GLIMEPIRIDE 2 MG PO TABS: ORAL_TABLET | ORAL | Status: DC

## 2014-08-30 MED ORDER — METFORMIN HCL ER 500 MG PO TB24: ORAL_TABLET | ORAL | Status: DC

## 2014-08-30 MED ORDER — DULOXETINE HCL 30 MG PO CPEP: 30.0000 mg | ORAL_CAPSULE | Freq: Every day | ORAL | Status: DC

## 2014-08-30 NOTE — Progress Notes (Signed)
Patient ID: Grace [name], female   DOB: Dec 03, 1950, 64 y.o.   MRN: [id number]  Annual Comprehensive Examination  This very nice 64 y.o.MWF presents as a new patient to establish care and referred by her husband - also our patient -Caleen Taaffe.  Patient has been followed for T2_NIDDM with painful neuropathy.Patient initiates dialog relating that she has not been compliant at medical f/u in the past, but she does intend to keep recommended appointments.     Patient reports her BP's have fluctuated up and dow in the past, but are usually Nl when she occasionally checks at home. . Patient alleges that her  BP has been controlled at home and patient denies any cardiac symptoms as chest pain, palpitations, shortness of breath, dizziness or ankle swelling. Today's BP was 136/76 mmHg and was elevated at 160-164 / 86-88 when rechecked by wrist cuff and wall cuff in the right arm. She relates she is unable to exercise to to her peripheral neuropathy and is wearing bilat short leg/ankle braces for Foot Drop.    Patient's relates her cholesterol values have been elevated in the past and further adds that she has not been very compliant in recommended follow-up or treatments.    Patient relates hx/o Gestational Diabetes 30 years with her 2sd pregnancy then was apparently in a "honeymoon" remission tiol about 10 years ago when she was started on Metformin for T2_NIDDM. Patient denies reactive hypoglycemic symptoms, visual blurring, diabetic polys, but has been followed several years by Dr Jannifer Franklin for painful Diabetic Neuropathy which apparently is helped by Cymbalta. She relates intolerance to Gabapentin due to edema.   Last A1c is unknown. Patient relates she has received monthly occular injections for over 3 years by Dr [name] for Wet Macular Degeneration.   Finally, patient has no k/o Vit D monitoring.   Medication Sig  . Calcium-Phosphorus-Vitamin D (CITRACAL +D3 PO) Take by mouth daily.  . DULoxetine  (CYMBALTA) 30 MG capsule Take 1 capsule (30 mg total) by mouth daily.  Marland Kitchen glimepiride (AMARYL) 2 MG tablet Take 2 mg by mouth daily with breakfast.  . metFORMIN (GLUCOPHAGE) 500 MG tablet Take by mouth 2 (two) times daily with a meal.   Allergies  Allergen Reactions  . Gabapentin Swelling    Swelling in legs and feet   Past Medical History  Diagnosis Date  . Diabetes   . Peripheral edema   . Diabetic peripheral neuropathy   . Diabetic retinopathy   . Polyneuropathy in diabetes(357.2) 07/28/2013  . Abnormality of gait 07/28/2013  . Foot drop, bilateral 07/31/2014   Health Maintenance  Topic Date Due  . HEMOGLOBIN A1C  03-06-1951  . FOOT EXAM  05/14/1960  . OPHTHALMOLOGY EXAM  05/14/1960  . URINE MICROALBUMIN  05/14/1960  . TETANUS/TDAP  05/14/1969  . COLONOSCOPY  05/14/2000  . ZOSTAVAX  05/14/2010  . INFLUENZA VACCINE  03/18/2014  . MAMMOGRAM  05/26/2014   Past Surgical History  Procedure Laterality Date  . Tonsillectomy    . Abdominal hysterectomy    . Hip surgery      right  . Ankle fracture surgery      left  . Cataract extraction, bilateral     Family History  Problem Relation Age of Onset  . Diabetes Mother   . Diabetes Brother   . Neuropathy Neg Hx    History  Substance Use Topics  . Smoking status: Never Smoker   . Smokeless tobacco: Never Used  . Alcohol Use: No  ROS Constitutional: Denies fever, chills, weight loss/gain, headaches, insomnia, fatigue, night sweats, and change in appetite. Eyes: Denies redness, blurred vision, diplopia, discharge, itchy, watery eyes.  ENT: Denies discharge, congestion, post nasal drip, epistaxis, sore throat, earache, hearing loss, dental pain, Tinnitus, Vertigo, Sinus pain, snoring.  Cardio: Denies chest pain, palpitations, irregular heartbeat, syncope, dyspnea, diaphoresis, orthopnea, PND, claudication, edema Respiratory: denies cough, dyspnea, DOE, pleurisy, hoarseness, laryngitis, wheezing.  Gastrointestinal:  Denies dysphagia, heartburn, reflux, water brash, pain, cramps, nausea, vomiting, bloating, diarrhea, constipation, hematemesis, melena, hematochezia, jaundice, hemorrhoids Genitourinary: Denies dysuria, frequency, urgency, nocturia, hesitancy, discharge, hematuria, flank pain Breast: Breast lumps, nipple discharge, bleeding.  Musculoskeletal: Denies arthralgia, myalgia, stiffness, Jt. Swelling, pain, limp, and strain/sprain. Denies falls. Skin: Denies puritis, rash, hives, warts, acne, eczema, changing in skin lesion Neuro: No weakness, tremor, incoordination, spasms, paresthesia, pain Psychiatric: Denies confusion, memory loss, sensory loss. Denies Depression. Endocrine: Denies change in weight, skin, hair change, nocturia, and paresthesia, diabetic polys, visual blurring, hyper / hypo glycemic episodes.  Heme/Lymph: No excessive bleeding, bruising, enlarged lymph nodes.  Physical Exam  BP 136/76 -> re-ck'd x 2 at 160-164/ 86-88   Pulse 68  Temp 97.7 F   Resp 16  Ht 5\' 5"    Wt 171 lb 9.6 oz     BMI 28.56   General Appearance: Well nourished and in no apparent distress. Eyes: PERRLA, EOMs, conjunctiva no swelling or erythema, fundi not well visualized. Sinuses: No frontal/maxillary tenderness ENT/Mouth: EACs patent / TMs  nl. Nares clear without erythema, swelling, mucoid exudates. Oral hygiene is good. No erythema, swelling, or exudate. Tongue normal, non-obstructing. Tonsils not swollen or erythematous. Hearing normal.  Neck: Supple, thyroid normal. No bruits, nodes or JVD. Respiratory: Respiratory effort normal.  BS equal and clear bilateral without rales, rhonci, wheezing or stridor. Cardio: Heart sounds are normal with regular rate and rhythm and no murmurs, rubs or gallops. Peripheral pulses are normal and equal bilaterally without edema.  Breasts: deferred Abdomen: Flat, soft, with bowl sounds. Nontender, no guarding, rebound, hernias, masses, or organomegaly.  Lymphatics: Non  tender without lymphadenopathy.  Musculoskeletal: Full ROM all peripheral extremities, joint stability, 5/5 strength and gait sl broad based. Skin: Warm and dry without rashes, lesions, cyanosis, clubbing or  ecchymosis.  Neuro: Cranial nerves intact, reflexes equal bilaterally. Normal muscle tone, no cerebellar symptoms. Sensation in a stocking distribution to the ankle level by 2-pt and monofilament testing. Marland Kitchen  Pysch: Awake and oriented X 3; flat affect;  Insight and Judgment appropriate.   Assessment and Plan  1. Hypertension  2. Hyperlipidemia 3. T2_NIDDM w/ Periph. Neuropathy  4. Hx/o Wet Macular Degeneration 4. Vitamin D Deficiency   Continue prudent diet as discussed, weight control, BP monitoring, regular exercise, and medications. Discussed med's effects and SE's. Screening labs and tests as requested with regular follow-up as recommended.   Follow to be determined pending labs.

## 2014-08-30 NOTE — Patient Instructions (Addendum)
Recommend the book "The END of DIETING" by Dr Baker Janus   and the book "The END of DIABETES " by Dr Excell Seltzer  At [email or web address] - get book & Audio CD's      Being diabetic has a  300% increased risk for heart attack, stroke, cancer, and alzheimer- type vascular dementia. It is very important that you work harder with diet by avoiding all foods that are white except chicken & fish. Avoid white rice (brown & wild rice is OK), white potatoes (sweetpotatoes in moderation is OK), White bread or wheat bread or anything made out of white flour like bagels, donuts, rolls, buns, biscuits, cakes, pastries, cookies, pizza crust, and pasta (made from white flour & egg whites) - vegetarian pasta or spinach or wheat pasta is OK. Multigrain breads like Arnold's or [employer], or multigrain sandwich thins or flatbreads.  Diet, exercise and weight loss can reverse and cure diabetes in the early stages.  Diet, exercise and weight loss is very important in the control and prevention of complications of diabetes which affects every system in your body, ie. Brain - dementia/stroke, eyes - glaucoma/blindness, heart - heart attack/heart failure, kidneys - dialysis, stomach - gastric paralysis, intestines - malabsorption, nerves - severe painful neuritis, circulation - gangrene & loss of a leg(s), and finally cancer and Alzheimers.    I recommend avoid fried & greasy foods,  sweets/candy, white rice (brown or wild rice or Quinoa is OK), white potatoes (sweet potatoes are OK) - anything made from white flour - bagels, doughnuts, rolls, buns, biscuits,white and wheat breads, pizza crust and traditional pasta made of white flour & egg white(vegetarian pasta or spinach or wheat pasta is OK).  Multi-grain bread is OK - like multi-grain flat bread or sandwich thins. Avoid alcohol in excess. Exercise is also important.    Eat all the vegetables you want - avoid meat, especially red meat and dairy - especially cheese.  Cheese  is the most concentrated form of trans-fats which is the worst thing to clog up our arteries. Veggie cheese is OK which can be found in the fresh produce section at [employer] or [employer] or [employer]  Gastroparesis  Gastroparesis is also called slowed stomach emptying (delayed gastric emptying). It is a condition in which the stomach takes too long to empty its contents. It often happens in people with diabetes.  CAUSES  Gastroparesis happens when nerves to the stomach are damaged or stop working. When the nerves are damaged, the muscles of the stomach and intestines do not work normally. The movement of food is slowed or stopped. High blood glucose (sugar) causes changes in nerves and can damage the blood vessels that carry oxygen and nutrients to the nerves. RISK FACTORS  Diabetes.  Post-viral syndromes.  Eating disorders (anorexia, bulimia).  Surgery on the stomach or vagus nerve.  Gastroesophageal reflux disease (rarely).  Smooth muscle disorders (amyloidosis, scleroderma).  Metabolic disorders, including hypothyroidism.  Parkinson disease. SYMPTOMS   Heartburn.  Feeling sick to your stomach (nausea).  Vomiting of undigested food.  An early feeling of fullness when eating.  Weight loss.  Abdominal bloating.  Erratic blood glucose levels.  Lack of appetite.  Gastroesophageal reflux.  Spasms of the stomach wall. Complications can include:  Bacterial overgrowth in stomach. Food stays in the stomach and can ferment and cause bacteria to grow.  Weight loss due to difficulty digesting and absorbing nutrients.  Vomiting.  Obstruction in the stomach. Undigested food can harden and  cause nausea and vomiting.  Blood glucose fluctuations caused by inconsistent food absorption. DIAGNOSIS  The diagnosis of gastroparesis is confirmed through one or more of the following tests:  Barium X-rays and scans. These tests look at how long it takes for food to move  through the stomach.  Gastric manometry. This test measures electrical and muscular activity in the stomach. A thin tube is passed down the throat into the stomach. The tube contains a wire that takes measurements of the stomach's electrical and muscular activity as it digests liquids and solid food.  Endoscopy. This procedure is done with a long, thin tube called an endoscope. It is passed through the mouth and gently down the esophagus into the stomach. This tube helps the caregiver look at the lining of the stomach to check for any abnormalities.  Ultrasonography. This can rule out gallbladder disease or pancreatitis. This test will outline and define the shape of the gallbladder and pancreas. TREATMENT   Treatments may include:  Exercise.  Medicines to control nausea and vomiting.  Medicines to stimulate s    tomach muscles.  Changes in what and when you eat.  Having smaller meals more often.  Eating low-fiber forms of high-fiber foods, such as eating cooked vegetables instead of raw vegetables.  Eating low-fat foods.  Consuming liquids, which are easier to digest.  In severe cases, feeding tubes and intravenous (IV) feeding may be needed. It is important to note that in most cases, treatment does not cure gastroparesis. It is usually a lasting (chronic) condition. Treatment helps you manage the underlying condition so that you can be as healthy and comfortable as possible. Other treatments  A gastric neurostimulator has been developed to assist people with gastroparesis. The battery-operated device is surgically implanted. It emits mild electrical pulses to help improve stomach emptying and to control nausea and vomiting.  The use of botulinum toxin has been shown to improve stomach emptying by decreasing the prolonged contractions of the muscle between the stomach and the small intestine (pyloric sphincter). The benefits are temporary. SEEK MEDICAL CARE IF:   You have  diabetes and you are having problems keeping your blood glucose in goal range.  You are having nausea, vomiting, bloating, or early feelings of fullness with eating.  Your symptoms do not change with a change in diet. Document Released: 08/04/2005 Document Revised: 11/29/2012 Document Reviewed: 01/11/2009  Vitamin D Deficiency Vitamin D is an important vitamin that your body needs. Having too little of it in your body is called a deficiency. A very bad deficiency can make your bones soft and can cause a condition called rickets.  Vitamin D is important to your body for different reasons, such as:   It helps your body absorb 2 minerals called calcium and phosphorus.  It helps make your bones healthy.  It may prevent some diseases, such as diabetes and multiple sclerosis.  It helps your muscles and heart. You can get vitamin D in several ways. It is a natural part of some foods. The vitamin is also added to some dairy products and cereals. Some people take vitamin D supplements. Also, your body makes vitamin D when you are in the sun. It changes the sun's rays into a form of the vitamin that your body can use. CAUSES   Not eating enough foods that contain vitamin D.  Not getting enough sunlight.  Having certain digestive system diseases that make it hard to absorb vitamin D. These diseases include Crohn's disease, chronic  pancreatitis, and cystic fibrosis.  Having a surgery in which part of the stomach or small intestine is removed.  Being obese. Fat cells pull vitamin D out of your blood. That means that obese people may not have enough vitamin D left in their blood and in other body tissues.  Having chronic kidney or liver disease. RISK FACTORS Risk factors are things that make you more likely to develop a vitamin D deficiency. They include:  Being older.  Not being able to get outside very much.  Living in a nursing home.  Having had broken bones.  Having weak or thin  bones (osteoporosis).  Having a disease or condition that changes how your body absorbs vitamin D.  Having dark skin.  Some medicines such as seizure medicines or steroids.  Being overweight or obese. SYMPTOMS Mild cases of vitamin D deficiency may not have any symptoms. If you have a very bad case, symptoms may include:  Bone pain.  Muscle pain.  Falling often.  Broken bones caused by a minor injury, due to osteoporosis. DIAGNOSIS A blood test is the best way to tell if you have a vitamin D deficiency. TREATMENT Vitamin D deficiency can be treated in different ways. Treatment for vitamin D deficiency depends on what is causing it. Options include:  Taking vitamin D supplements.  Taking a calcium supplement. Your caregiver will suggest what dose is best for you. HOME CARE INSTRUCTIONS  Take any supplements that your caregiver prescribes. Follow the directions carefully. Take only the suggested amount.  Have your blood tested 2 months after you start taking supplements.  Eat foods that contain vitamin D. Healthy choices include:  Fortified dairy products, cereals, or juices. Fortified means vitamin D has been added to the food. Check the label on the package to be sure.  Fatty fish like salmon or trout.  Eggs.  Oysters.  Do not use a tanning bed.  Keep your weight at a healthy level. Lose weight if you need to.  Keep all follow-up appointments. Your caregiver will need to perform blood tests to make sure your vitamin D deficiency is going away. SEEK MEDICAL CARE IF:  You have any questions about your treatment.  You continue to have symptoms of vitamin D deficiency.  You have nausea or vomiting.  You are constipated.  You feel confused.  You have severe abdominal or back pain. MAKE SURE YOU:  Understand these instructions.  Will watch your condition.  Will get help right away if you are not doing well or get worse. Document Released: 10/27/2011  Document Revised: 11/29/2012 Document Reviewed: 10/27/2011 [facility] Patient Information 2015 [place], [place]. This information is not intended to replace advice given to you by your health care provider. Make sure you discuss any questions you have with your health care provider.  [employer] Patient Information 2015 [place]. This information is not intended to replace advice given to you by your health care provider. Make sure you discuss any questions you have with your health care provider.

## 2014-08-31 LAB — CBC WITH DIFFERENTIAL/PLATELET
Basophils Absolute: 0 10*3/uL (ref 0.0–0.1)
Basophils Relative: 0 % (ref 0–1)
Eosinophils Absolute: 0.5 10*3/uL (ref 0.0–0.7)
Eosinophils Relative: 5 % (ref 0–5)
HCT: 37.4 % (ref 36.0–46.0)
Hemoglobin: 12.6 g/dL (ref 12.0–15.0)
Lymphocytes Relative: 32 % (ref 12–46)
Lymphs Abs: 2.9 10*3/uL (ref 0.7–4.0)
MCH: 31.1 pg (ref 26.0–34.0)
MCHC: 33.7 g/dL (ref 30.0–36.0)
MCV: 92.3 fL (ref 78.0–100.0)
MPV: 9.1 fL (ref 8.6–12.4)
Monocytes Absolute: 0.9 10*3/uL (ref 0.1–1.0)
Monocytes Relative: 10 % (ref 3–12)
Neutro Abs: 4.8 10*3/uL (ref 1.7–7.7)
Neutrophils Relative %: 53 % (ref 43–77)
Platelets: 393 10*3/uL (ref 150–400)
RBC: 4.05 MIL/uL (ref 3.87–5.11)
RDW: 13.5 % (ref 11.5–15.5)
WBC: 9.1 10*3/uL (ref 4.0–10.5)

## 2014-08-31 LAB — URINALYSIS, MICROSCOPIC ONLY
Casts: NONE SEEN
Crystals: NONE SEEN

## 2014-08-31 LAB — MICROALBUMIN / CREATININE URINE RATIO
Creatinine, Urine: 198.1 mg/dL
Microalb Creat Ratio: 17.2 mg/g (ref 0.0–30.0)
Microalb, Ur: 3.4 mg/dL — ABNORMAL HIGH (ref <2.0)

## 2014-08-31 LAB — HEMOGLOBIN A1C
Hgb A1c MFr Bld: 7 % — ABNORMAL HIGH (ref <5.7)
Mean Plasma Glucose: 154 mg/dL — ABNORMAL HIGH (ref <117)

## 2014-08-31 LAB — VITAMIN D 25 HYDROXY (VIT D DEFICIENCY, FRACTURES): Vit D, 25-Hydroxy: 26 ng/mL — ABNORMAL LOW (ref 30–100)

## 2014-08-31 LAB — TSH: TSH: 1.443 u[IU]/mL (ref 0.350–4.500)

## 2014-08-31 LAB — INSULIN, FASTING: Insulin fasting, serum: 13.3 u[IU]/mL (ref 2.0–19.6)

## 2014-09-01 ENCOUNTER — Other Ambulatory Visit: Payer: Self-pay | Admitting: Neurology

## 2014-09-02 LAB — URINE CULTURE
Colony Count: NO GROWTH
Organism ID, Bacteria: NO GROWTH

## 2014-10-15 ENCOUNTER — Emergency Department (HOSPITAL_COMMUNITY)
Admission: EM | Admit: 2014-10-15 | Discharge: 2014-10-15 | Disposition: A | Discharge status: Home / Self Care | Payer: Medicare HMO | Source: Home / Self Care | Attending: Family Medicine | Admitting: Family Medicine

## 2014-10-15 ENCOUNTER — Encounter (HOSPITAL_COMMUNITY): Payer: Self-pay | Admitting: Emergency Medicine

## 2014-10-15 DIAGNOSIS — J0100 Acute maxillary sinusitis, unspecified: Secondary | ICD-10-CM

## 2014-10-15 MED ORDER — CEFDINIR 300 MG PO CAPS: 300.0000 mg | ORAL_CAPSULE | Freq: Two times a day (BID) | ORAL | Status: DC

## 2014-10-15 MED ORDER — IPRATROPIUM BROMIDE 0.06 % NA SOLN
2.0000 | Freq: Four times a day (QID) | NASAL | Status: DC
Start: 2014-10-15 — End: 2015-01-03

## 2014-10-15 NOTE — Discharge Instructions (Signed)
Thank you for coming in today. Call or go to the emergency room if you get worse, have trouble breathing, have chest pains, or palpitations.  Follow-up with your primary care provider to address your elevated blood pressure.   Sinusitis Sinusitis is redness, soreness, and inflammation of the paranasal sinuses. Paranasal sinuses are air pockets within the bones of your face (beneath the eyes, the middle of the forehead, or above the eyes). In healthy paranasal sinuses, mucus is able to drain out, and air is able to circulate through them by way of your nose. However, when your paranasal sinuses are inflamed, mucus and air can become trapped. This can allow bacteria and other germs to grow and cause infection. Sinusitis can develop quickly and last only a short time (acute) or continue over a long period (chronic). Sinusitis that lasts for more than 12 weeks is considered chronic.  CAUSES  Causes of sinusitis include:  Allergies.  Structural abnormalities, such as displacement of the cartilage that separates your nostrils (deviated septum), which can decrease the air flow through your nose and sinuses and affect sinus drainage.  Functional abnormalities, such as when the small hairs (cilia) that line your sinuses and help remove mucus do not work properly or are not present. SIGNS AND SYMPTOMS  Symptoms of acute and chronic sinusitis are the same. The primary symptoms are pain and pressure around the affected sinuses. Other symptoms include:  Upper toothache.  Earache.  Headache.  Bad breath.  Decreased sense of smell and taste.  A cough, which worsens when you are lying flat.  Fatigue.  Fever.  Thick drainage from your nose, which often is green and may contain pus (purulent).  Swelling and warmth over the affected sinuses. DIAGNOSIS  Your health care provider will perform a physical exam. During the exam, your health care provider may:  Look in your nose for signs of abnormal  growths in your nostrils (nasal polyps).  Tap over the affected sinus to check for signs of infection.  View the inside of your sinuses (endoscopy) using an imaging device that has a light attached (endoscope). If your health care provider suspects that you have chronic sinusitis, one or more of the following tests may be recommended:  Allergy tests.  Nasal culture. A sample of mucus is taken from your nose, sent to a lab, and screened for bacteria.  Nasal cytology. A sample of mucus is taken from your nose and examined by your health care provider to determine if your sinusitis is related to an allergy. TREATMENT  Most cases of acute sinusitis are related to a viral infection and will resolve on their own within 10 days. Sometimes medicines are prescribed to help relieve symptoms (pain medicine, decongestants, nasal steroid sprays, or saline sprays).  However, for sinusitis related to a bacterial infection, your health care provider will prescribe antibiotic medicines. These are medicines that will help kill the bacteria causing the infection.  Rarely, sinusitis is caused by a fungal infection. In theses cases, your health care provider will prescribe antifungal medicine. For some cases of chronic sinusitis, surgery is needed. Generally, these are cases in which sinusitis recurs more than 3 times per year, despite other treatments. HOME CARE INSTRUCTIONS   Drink plenty of water. Water helps thin the mucus so your sinuses can drain more easily.  Use a humidifier.  Inhale steam 3 to 4 times a day (for example, sit in the bathroom with the shower running).  Apply a warm, moist washcloth to  your face 3 to 4 times a day, or as directed by your health care provider.  Use saline nasal sprays to help moisten and clean your sinuses.  Take medicines only as directed by your health care provider.  If you were prescribed either an antibiotic or antifungal medicine, finish it all even if you start  to feel better. SEEK IMMEDIATE MEDICAL CARE IF:  You have increasing pain or severe headaches.  You have nausea, vomiting, or drowsiness.  You have swelling around your face.  You have vision problems.  You have a stiff neck.  You have difficulty breathing. MAKE SURE YOU:   Understand these instructions.  Will watch your condition.  Will get help right away if you are not doing well or get worse. Document Released: 08/04/2005 Document Revised: 12/19/2013 Document Reviewed: 08/19/2011 [facility] Patient Information 2015 [place], [place]. This information is not intended to replace advice given to you by your health care provider. Make sure you discuss any questions you have with your health care provider.

## 2014-10-15 NOTE — ED Notes (Signed)
C/o cold symptoms x 2 weeks with three days of vomiting.  C/o  Cough.  Runny nose.  Left sided facial pain.    No relief with otc meds.

## 2014-10-15 NOTE — ED Provider Notes (Signed)
Grace [name] is a 64 y.o. female who presents to Urgent Care today for cough congestion and runny nose left-sided facial pain and pressure. Symptoms present for 2 weeks. Patient developed left-sided facial pain and pressure over the last several days when her symptoms worsened. She denies any vomiting or diarrhea. She's tried some over-the-counter medications which have helped a little. No chest pain palpitations shortness of breath abdominal pain vomiting or diarrhea.   Past Medical History  Diagnosis Date  . Diabetes   . Peripheral edema   . Diabetic peripheral neuropathy   . Diabetic retinopathy   . Polyneuropathy in diabetes(357.2) 07/28/2013  . Abnormality of gait 07/28/2013  . Foot drop, bilateral 07/31/2014   Past Surgical History  Procedure Laterality Date  . Tonsillectomy    . Abdominal hysterectomy    . Hip surgery      right  . Ankle fracture surgery      left  . Cataract extraction, bilateral     History  Substance Use Topics  . Smoking status: Never Smoker   . Smokeless tobacco: Never Used  . Alcohol Use: No   ROS as above Medications: No current facility-administered medications for this encounter.   Current Outpatient Prescriptions  Medication Sig Dispense Refill  . Calcium-Phosphorus-Vitamin D (CITRACAL +D3 PO) Take by mouth daily.    . DULoxetine (CYMBALTA) 30 MG capsule Take 1 capsule (30 mg total) by mouth daily. For Neuropathy 90 capsule 0  . DULoxetine (CYMBALTA) 30 MG capsule TAKE 1 CAPSULE (30 MG TOTAL) BY MOUTH DAILY. 90 capsule 3  . glimepiride (AMARYL) 2 MG tablet Take 1/2 to 1 tablet daily for Diabetes 90 tablet 0  . metFORMIN (GLUCOPHAGE-XR) 500 MG 24 hr tablet Take 1 to 2 tablets 2 x day with food as directed for Diabetes 360 tablet 0  . cefdinir (OMNICEF) 300 MG capsule Take 1 capsule (300 mg total) by mouth 2 (two) times daily. 14 capsule 0  . ipratropium (ATROVENT) 0.06 % nasal spray Place 2 sprays into both nostrils 4 (four) times daily.  15 mL 1   Allergies  Allergen Reactions  . Gabapentin Swelling    Swelling in legs and feet     Exam:  BP 160/90 mmHg  Pulse 79  Temp(Src) 98.2 F (36.8 C) (Oral)  Resp 16  SpO2 97% Gen: Well NAD nontoxic appearing HEENT: EOMI,  MMM inflamed nasal turbinates bilaterally. Tender palpation left maxillary sinus. Normal tympanic membranes bilaterally. Posterior pharynx is mildly erythematous. Lungs: Normal work of breathing. CTABL Heart: RRR no MRG Abd: NABS, Soft. Nondistended, Nontender Exts: Brisk capillary refill, warm and well perfused.   No results found for this or any previous visit (from the past 24 hour(s)). No results found.  Assessment and Plan: 64 y.o. female with value URI with subsequent acute sinusitis likely bacterial. Treat with Omnicef and Atrovent nasal spray. Additionally patient's blood pressure is elevated. Recommend follow-up with PCP in the near future for evaluation and management of this issue.  Discussed warning signs or symptoms. Please see discharge instructions. Patient expresses understanding.     Gregor Hams, MD 10/15/14 [phone]

## 2014-11-28 ENCOUNTER — Other Ambulatory Visit: Payer: Self-pay | Admitting: Internal Medicine

## 2014-12-07 ENCOUNTER — Ambulatory Visit: Payer: Self-pay | Admitting: Internal Medicine

## 2015-01-03 ENCOUNTER — Encounter: Payer: Self-pay | Admitting: Internal Medicine

## 2015-01-03 ENCOUNTER — Ambulatory Visit (INDEPENDENT_AMBULATORY_CARE_PROVIDER_SITE_OTHER): Payer: 59 | Admitting: Internal Medicine

## 2015-01-03 VITALS — BP 128/74 | HR 72 | Temp 97.0°F | Resp 16 | Ht 65.0 in | Wt 171.4 lb

## 2015-01-03 DIAGNOSIS — I10 Essential (primary) hypertension: Secondary | ICD-10-CM

## 2015-01-03 DIAGNOSIS — K219 Gastro-esophageal reflux disease without esophagitis: Secondary | ICD-10-CM

## 2015-01-03 DIAGNOSIS — E559 Vitamin D deficiency, unspecified: Secondary | ICD-10-CM

## 2015-01-03 DIAGNOSIS — J0100 Acute maxillary sinusitis, unspecified: Secondary | ICD-10-CM

## 2015-01-03 DIAGNOSIS — E119 Type 2 diabetes mellitus without complications: Secondary | ICD-10-CM

## 2015-01-03 DIAGNOSIS — E782 Mixed hyperlipidemia: Secondary | ICD-10-CM

## 2015-01-03 DIAGNOSIS — Z79899 Other long term (current) drug therapy: Secondary | ICD-10-CM

## 2015-01-03 MED ORDER — RANITIDINE HCL 300 MG PO TABS
ORAL_TABLET | ORAL | Status: DC
Start: 2015-01-03 — End: 2015-08-01

## 2015-01-03 MED ORDER — LEVOFLOXACIN 500 MG PO TABS: ORAL_TABLET | ORAL | Status: DC

## 2015-01-03 NOTE — Patient Instructions (Addendum)

## 2015-01-03 NOTE — Progress Notes (Signed)
Patient ID: Grace [name], female   DOB: 1951-03-14, 64 y.o.   MRN: [id number]   This very nice 64 y.o. MWF presents for 3 month follow up with Hypertension, Hyperlipidemia, T2_NIDDM and Vitamin D Deficiency. Patient also c/o recent Heartburn and water brash reflux, nausea and EG burning and abdominal bloating.    Patient is treated for HTN & BP has been controlled at home. Today's BP: 128/74 mmHg. Patient has had no complaints of any cardiac type chest pain, palpitations, dyspnea/orthopnea/PND, dizziness, claudication, or dependent edema.   Hyperlipidemia is controlled with diet & meds. Patient denies myalgias or other med SE's. Last Lipids were at goal - Total Chol  193; HDL 50; LDL 100; and with elevated Trig 214 on 08/30/2014:   Also, the patient has history of T2_NIDDM and has had no symptoms of reactive hypoglycemia, diabetic polys, paresthesias or visual blurring. She is receiving eye injections for wet MD from Dr [name].  Last A1c was not at goal with A1c 7.0 % on 08/30/2014.   Further, the patient also has history of Vitamin D Deficiency and supplements vitamin D without any suspected side-effects. Last vitamin D was  26 on 08/30/2014.  Medication Sig  . CITRACAL +D Take by mouth daily.  Marland Kitchen glimepiride  2 MG tablet TAKE 1/2 TO 1 TABLET DAILY FOR DIABETES  . metFORMIN -XR 500 MG 24 hr tablet Take 1 to 2 tablets 2 x day with food as directed for Diabetes  . DULoxetine  30 MG capsule Take3 caps (90 mg) daily. For Neuropathy  . ipratropium (ATROVENT) 0.06 % nasal spray Place 2 sprays into both nostrils 4 (four) times daily.   Allergies  Allergen Reactions  . Gabapentin Swelling    Swelling in legs and feet   PMHx:   Past Medical History  Diagnosis Date  . Diabetes   . Peripheral edema   . Diabetic peripheral neuropathy   . Diabetic retinopathy   . Polyneuropathy in diabetes(357.2) 07/28/2013  . Abnormality of gait 07/28/2013  . Foot drop, bilateral 07/31/2014    There is no  immunization history on file for this patient. Past Surgical History  Procedure Laterality Date  . Tonsillectomy    . Abdominal hysterectomy    . Hip surgery      right  . Ankle fracture surgery      left  . Cataract extraction, bilateral     FHx:    Reviewed / unchanged  SHx:    Reviewed / unchanged  Systems Review:  Constitutional: Denies fever, chills, wt changes, headaches, insomnia, fatigue, night sweats, change in appetite. Eyes: Denies redness, blurred vision, diplopia, discharge, itchy, watery eyes.  ENT: Denies discharge, congestion, post nasal drip, epistaxis, sore throat, earache, hearing loss, dental pain, tinnitus, vertigo, sinus pain, snoring.  CV: Denies chest pain, palpitations, irregular heartbeat, syncope, dyspnea, diaphoresis, orthopnea, PND, claudication or edema. Respiratory: denies cough, dyspnea, DOE, pleurisy, hoarseness, laryngitis, wheezing.  Gastrointestinal: Denies dysphagia, odynophagia, heartburn, reflux, water brash, abdominal pain or cramps, nausea, vomiting, bloating, diarrhea, constipation, hematemesis, melena, hematochezia  or hemorrhoids. Genitourinary: Denies dysuria, frequency, urgency, nocturia, hesitancy, discharge, hematuria or flank pain. Musculoskeletal: Denies arthralgias, myalgias, stiffness, jt. swelling, pain, limping or strain/sprain.  Skin: Denies pruritus, rash, hives, warts, acne, eczema or change in skin lesion(s). Neuro: No weakness, tremor, incoordination, spasms,but has  paresthesia of the feet and a right foot drop. Psychiatric: Denies confusion, memory loss or sensory loss. Endo: Denies change in weight, skin or hair change.  Heme/Lymph: No  excessive bleeding, bruising or enlarged lymph nodes.  Physical Exam  BP 128/74   Pulse 72  Temp(Src) 97 F   Resp 16  Ht 5\' 5"    Wt 171 lb 6.4 oz     BMI 28.52   Appears well nourished and in no distress. Eyes: PERRLA, EOMs, conjunctiva no swelling or erythema. Sinuses: Bilateral  frontal/maxillary tenderness - worse Lt>Rt.  ENT/Mouth: EAC's clear, TM's nl w/o erythema, bulging. Nares clear w/o erythema, swelling, exudates. Oropharynx clear without erythema or exudates. Oral hygiene is good. Tongue normal, non obstructing. Hearing intact.  Neck: Supple. Thyroid nl. Car 2+/2+ without bruits, nodes or JVD. Chest: Respirations nl with BS clear & equal w/o rales, rhonchi, wheezing or stridor.  Cor: Heart sounds normal w/ regular rate and rhythm without sig. murmurs, gallops, clicks, or rubs. Peripheral pulses normal and equal  without edema.  Abdomen: Soft & bowel sounds normal. Non-tender w/o guarding, rebound, hernias, masses, or organomegaly.  Lymphatics: Unremarkable.  Musculoskeletal: Full ROM all peripheral extremities, joint stability, 5/5 strength, and normal gait.  Skin: Warm, dry without exposed rashes, lesions or ecchymosis apparent.  Neuro: Cranial nerves intact, reflexes equal bilaterally. Sensory-motor testing grossly intact w/ decreased distal sensory at the feet. KJs/AJs absent.  Pysch: Alert & oriented x 3.  Insight and judgement nl & appropriate. No ideations.  Assessment and Plan:  1. Essential hypertension  - TSH  2. Mixed hyperlipidemia  - Lipid panel  3. Acute maxillary sinusitis  - levofloxacin (LEVAQUIN) 500 MG tablet; Take 1 tablet daily with food for infection  Dispense: 15 tablet; Refill: 1  4. Type 2 diabetes mellitus without complication  - Hemoglobin A1c - Insulin, random  5. Vitamin D deficiency  - Vit D  25 hydroxy   6. Medication management  - CBC with Differential/Platelet - BASIC METABOLIC PANEL WITH GFR - Hepatic function panel - Magnesium  7. Gastroesophageal reflux disease  - ranitidine (ZANTAC) 300 MG tablet; Take 1 to 2 tablets daily for heartburn & reflux  Dispense: 180 tablet; Refill: 99   Recommended regular exercise, BP monitoring, weight control, and discussed med and SE's. Recommended labs to assess and  monitor clinical status. Further disposition pending results of labs. Over 30 minutes of exam, counseling, chart review was performed

## 2015-01-04 LAB — HEPATIC FUNCTION PANEL
ALT: 13 U/L (ref 0–35)
AST: 13 U/L (ref 0–37)
Albumin: 3.9 g/dL (ref 3.5–5.2)
Alkaline Phosphatase: 60 U/L (ref 39–117)
Bilirubin, Direct: <0.1 mg/dL (ref 0.0–0.3)
Total Bilirubin: 0.3 mg/dL (ref 0.2–1.2)
Total Protein: 6.5 g/dL (ref 6.0–8.3)

## 2015-01-04 LAB — CBC WITH DIFFERENTIAL/PLATELET
Basophils Absolute: 0 10*3/uL (ref 0.0–0.1)
Basophils Relative: 0 % (ref 0–1)
Eosinophils Absolute: 0.3 10*3/uL (ref 0.0–0.7)
Eosinophils Relative: 4 % (ref 0–5)
HCT: 37.2 % (ref 36.0–46.0)
Hemoglobin: 12.2 g/dL (ref 12.0–15.0)
Lymphocytes Relative: 31 % (ref 12–46)
Lymphs Abs: 2.1 10*3/uL (ref 0.7–4.0)
MCH: 30.1 pg (ref 26.0–34.0)
MCHC: 32.8 g/dL (ref 30.0–36.0)
MCV: 91.9 fL (ref 78.0–100.0)
MPV: 9 fL (ref 8.6–12.4)
Monocytes Absolute: 0.4 10*3/uL (ref 0.1–1.0)
Monocytes Relative: 6 % (ref 3–12)
Neutro Abs: 4.1 10*3/uL (ref 1.7–7.7)
Neutrophils Relative %: 59 % (ref 43–77)
Platelets: 362 10*3/uL (ref 150–400)
RBC: 4.05 MIL/uL (ref 3.87–5.11)
RDW: 14.2 % (ref 11.5–15.5)
WBC: 6.9 10*3/uL (ref 4.0–10.5)

## 2015-01-04 LAB — HEMOGLOBIN A1C
Hgb A1c MFr Bld: 7.3 % — ABNORMAL HIGH (ref <5.7)
Mean Plasma Glucose: 163 mg/dL — ABNORMAL HIGH (ref <117)

## 2015-01-04 LAB — LIPID PANEL
Cholesterol: 209 mg/dL — ABNORMAL HIGH (ref 0–200)
HDL: 48 mg/dL (ref >=46)
Total CHOL/HDL Ratio: 4.4 Ratio
Triglycerides: 516 mg/dL — ABNORMAL HIGH (ref <150)

## 2015-01-04 LAB — TSH: TSH: 1.478 u[IU]/mL (ref 0.350–4.500)

## 2015-01-04 LAB — BASIC METABOLIC PANEL WITH GFR
BUN: 18 mg/dL (ref 6–23)
CO2: 31 mEq/L (ref 19–32)
Calcium: 9.7 mg/dL (ref 8.4–10.5)
Chloride: 100 mEq/L (ref 96–112)
Creat: 0.69 mg/dL (ref 0.50–1.10)
GFR, Est African American: >89 mL/min
GFR, Est Non African American: >89 mL/min
Glucose, Bld: 94 mg/dL (ref 70–99)
Potassium: 5 mEq/L (ref 3.5–5.3)
Sodium: 139 mEq/L (ref 135–145)

## 2015-01-04 LAB — INSULIN, RANDOM: Insulin: 14.3 u[IU]/mL (ref 2.0–19.6)

## 2015-01-04 LAB — MAGNESIUM: Magnesium: 1.8 mg/dL (ref 1.5–2.5)

## 2015-01-04 LAB — VITAMIN D 25 HYDROXY (VIT D DEFICIENCY, FRACTURES): Vit D, 25-Hydroxy: 19 ng/mL — ABNORMAL LOW (ref 30–100)

## 2015-01-06 ENCOUNTER — Other Ambulatory Visit: Payer: Self-pay | Admitting: Internal Medicine

## 2015-03-02 ENCOUNTER — Other Ambulatory Visit: Payer: Self-pay | Admitting: Internal Medicine

## 2015-04-05 ENCOUNTER — Ambulatory Visit: Payer: Self-pay | Admitting: Internal Medicine

## 2015-04-08 ENCOUNTER — Other Ambulatory Visit: Payer: Self-pay | Admitting: Physician Assistant

## 2015-04-10 ENCOUNTER — Ambulatory Visit (INDEPENDENT_AMBULATORY_CARE_PROVIDER_SITE_OTHER): Payer: 59 | Admitting: Internal Medicine

## 2015-04-10 ENCOUNTER — Encounter: Payer: Self-pay | Admitting: Internal Medicine

## 2015-04-10 VITALS — BP 138/78 | HR 68 | Temp 98.2°F | Resp 16 | Ht 65.0 in | Wt 177.0 lb

## 2015-04-10 DIAGNOSIS — E119 Type 2 diabetes mellitus without complications: Secondary | ICD-10-CM | POA: Diagnosis not present

## 2015-04-10 DIAGNOSIS — Z79899 Other long term (current) drug therapy: Secondary | ICD-10-CM

## 2015-04-10 DIAGNOSIS — E559 Vitamin D deficiency, unspecified: Secondary | ICD-10-CM

## 2015-04-10 DIAGNOSIS — E782 Mixed hyperlipidemia: Secondary | ICD-10-CM

## 2015-04-10 DIAGNOSIS — I10 Essential (primary) hypertension: Secondary | ICD-10-CM | POA: Diagnosis not present

## 2015-04-10 MED ORDER — METOCLOPRAMIDE HCL 10 MG PO TABS: 10.0000 mg | ORAL_TABLET | Freq: Three times a day (TID) | ORAL | Status: DC

## 2015-04-10 MED ORDER — METFORMIN HCL ER 500 MG PO TB24: ORAL_TABLET | ORAL | Status: DC

## 2015-04-10 NOTE — Progress Notes (Signed)
Patient ID: BURNELL MATLIN, female   DOB: 1951-08-01, 64 y.o.   MRN: [id number]  Assessment and Plan:  Hypertension:  -Continue medication -monitor blood pressure at home. -Continue DASH diet -Reminder to go to the ER if any CP, SOB, nausea, dizziness, severe HA, changes vision/speech, left arm numbness and tingling and jaw pain.  Cholesterol - Continue diet and exercise -Check cholesterol.   Diabetes with diabetic chronic kidney disease -Continue diet and exercise.  -Check A1C  Vitamin D Def -check level -continue medications.   Possible Gastroparesis given history -reglan -cont zantac  Continue diet and meds as discussed. Further disposition pending results of labs. Discussed med's effects and SE's.    HPI 64 y.o. female  presents for 3 month follow up with hypertension, hyperlipidemia, diabetes and vitamin D deficiency.   Her blood pressure has been controlled at home, today their BP is BP: 138/78 mmHg.She does workout. She denies chest pain, shortness of breath, dizziness.   She is on cholesterol medication and denies myalgias. Her cholesterol is at goal. The cholesterol was:  01/03/2015: Cholesterol 209*; HDL 48; LDL Cholesterol NOT CALC; Triglycerides 516*   She has been working on diet and exercise for diabetes with diabetic chronic kidney disease, she is on bASA, she is on ACE/ARB, and denies  foot ulcerations, hyperglycemia, hypoglycemia , increased appetite, nausea, paresthesia of the feet, polydipsia, polyuria, visual disturbances, vomiting and weight loss. Last A1C was: 01/03/2015: Hgb A1c MFr Bld 7.3*   Patient is on Vitamin D supplement. 01/03/2015: Vit D, 25-Hydroxy 19*  Patient does complain of abdominal bloating, food regurgitation, and nausea after eating.  She has tried zantac which helps with pain, but has not relieved vomiting.    Current Medications:  Current Outpatient Prescriptions on File Prior to Visit  Medication Sig Dispense Refill  .  Calcium-Phosphorus-Vitamin D (CITRACAL +D3 PO) Take by mouth daily.    . DULoxetine (CYMBALTA) 30 MG capsule TAKE 1 CAPSULE (30 MG TOTAL) BY MOUTH DAILY. 90 capsule 3  . glimepiride (AMARYL) 2 MG tablet TAKE 1/2 TO 1 TABLET DAILY FOR DIABETES 90 tablet 0  . ipratropium (ATROVENT) 0.06 % nasal spray     . ranitidine (ZANTAC) 300 MG tablet Take 1 to 2 tablets daily for heartburn & reflux 180 tablet 99   No current facility-administered medications on file prior to visit.   Medical History:  Past Medical History  Diagnosis Date  . Diabetes   . Peripheral edema   . Diabetic peripheral neuropathy   . Diabetic retinopathy   . Polyneuropathy in diabetes(357.2) 07/28/2013  . Abnormality of gait 07/28/2013  . Foot drop, bilateral 07/31/2014   Allergies:  Allergies  Allergen Reactions  . Gabapentin Swelling    Swelling in legs and feet     Review of Systems:  Review of Systems  Constitutional: Positive for malaise/fatigue. Negative for fever and chills.  HENT: Negative for congestion, ear pain and sore throat.   Eyes: Negative.   Respiratory: Negative for cough, shortness of breath and wheezing.   Cardiovascular: Negative for chest pain, palpitations and leg swelling.  Gastrointestinal: Positive for heartburn, nausea, vomiting and diarrhea. Negative for constipation, blood in stool and melena.  Genitourinary: Negative.   Skin: Negative.   Neurological: Positive for sensory change. Negative for dizziness, loss of consciousness and headaches.  Psychiatric/Behavioral: Negative for depression. The patient is not nervous/anxious and does not have insomnia.     Family history- Review and unchanged  Social history- Review and unchanged  Physical Exam: BP 138/78 mmHg  Pulse 68  Temp(Src) 98.2 F (36.8 C) (Temporal)  Resp 16  Ht 5\' 5"  (1.651 m)  Wt 177 lb (80.287 kg)  BMI 29.45 kg/m2 Wt Readings from Last 3 Encounters:  04/10/15 177 lb (80.287 kg)  01/03/15 171 lb 6.4 oz (77.747  kg)  08/30/14 171 lb 9.6 oz (77.837 kg)   General Appearance: Well nourished well developed, non-toxic appearing, in no apparent distress. Eyes: PERRLA, EOMs, conjunctiva no swelling or erythema ENT/Mouth: Ear canals clear with no erythema, swelling, or discharge.  TMs normal bilaterally, oropharynx clear, moist, with no exudate.   Neck: Supple, thyroid normal, no JVD, no cervical adenopathy.  Respiratory: Respiratory effort normal, breath sounds clear A&P, no wheeze, rhonchi or rales noted.  No retractions, no accessory muscle usage Cardio: RRR with no MRGs. No noted edema.  Abdomen: Soft, + BS.  Non tender, no guarding, rebound, hernias, masses. Musculoskeletal: Full ROM, 5/5 strength, Normal gait Skin: Warm, dry without rashes, lesions, ecchymosis.  Neuro: Awake and oriented X 3, Cranial nerves intact. No cerebellar symptoms.  Psych: normal affect, Insight and Judgment appropriate.    Starlyn Skeans, PA-C 4:54 PM [facility]

## 2015-04-11 LAB — CBC WITH DIFFERENTIAL/PLATELET
Basophils Absolute: 0 10*3/uL (ref 0.0–0.1)
Basophils Relative: 0 % (ref 0–1)
Eosinophils Absolute: 0.3 10*3/uL (ref 0.0–0.7)
Eosinophils Relative: 5 % (ref 0–5)
HCT: 35.9 % — ABNORMAL LOW (ref 36.0–46.0)
Hemoglobin: 11.9 g/dL — ABNORMAL LOW (ref 12.0–15.0)
Lymphocytes Relative: 33 % (ref 12–46)
Lymphs Abs: 2.1 10*3/uL (ref 0.7–4.0)
MCH: 30.4 pg (ref 26.0–34.0)
MCHC: 33.1 g/dL (ref 30.0–36.0)
MCV: 91.6 fL (ref 78.0–100.0)
MPV: 8.6 fL (ref 8.6–12.4)
Monocytes Absolute: 0.5 10*3/uL (ref 0.1–1.0)
Monocytes Relative: 8 % (ref 3–12)
Neutro Abs: 3.5 10*3/uL (ref 1.7–7.7)
Neutrophils Relative %: 54 % (ref 43–77)
Platelets: 351 10*3/uL (ref 150–400)
RBC: 3.92 MIL/uL (ref 3.87–5.11)
RDW: 14.4 % (ref 11.5–15.5)
WBC: 6.5 10*3/uL (ref 4.0–10.5)

## 2015-04-11 LAB — HEPATIC FUNCTION PANEL
ALT: 13 U/L (ref 6–29)
AST: 15 U/L (ref 10–35)
Albumin: 4.1 g/dL (ref 3.6–5.1)
Alkaline Phosphatase: 57 U/L (ref 33–130)
Bilirubin, Direct: <0.1 mg/dL (ref <=0.2)
Total Bilirubin: 0.2 mg/dL (ref 0.2–1.2)
Total Protein: 6.4 g/dL (ref 6.1–8.1)

## 2015-04-11 LAB — LIPID PANEL
Cholesterol: 203 mg/dL — ABNORMAL HIGH (ref 125–200)
HDL: 48 mg/dL (ref >=46)
LDL Cholesterol: 84 mg/dL (ref <130)
Total CHOL/HDL Ratio: 4.2 Ratio (ref <=5.0)
Triglycerides: 356 mg/dL — ABNORMAL HIGH (ref <150)
VLDL: 71 mg/dL — ABNORMAL HIGH (ref <30)

## 2015-04-11 LAB — BASIC METABOLIC PANEL WITH GFR
BUN: 24 mg/dL (ref 7–25)
CO2: 33 mmol/L — ABNORMAL HIGH (ref 20–31)
Calcium: 10.2 mg/dL (ref 8.6–10.4)
Chloride: 99 mmol/L (ref 98–110)
Creat: 0.89 mg/dL (ref 0.50–0.99)
GFR, Est African American: 79 mL/min (ref >=60)
GFR, Est Non African American: 69 mL/min (ref >=60)
Glucose, Bld: 73 mg/dL (ref 65–99)
Potassium: 5.2 mmol/L (ref 3.5–5.3)
Sodium: 144 mmol/L (ref 135–146)

## 2015-04-11 LAB — TSH: TSH: 1.754 u[IU]/mL (ref 0.350–4.500)

## 2015-04-11 LAB — INSULIN, RANDOM: Insulin: 22.6 u[IU]/mL — ABNORMAL HIGH (ref 2.0–19.6)

## 2015-04-11 LAB — VITAMIN D 25 HYDROXY (VIT D DEFICIENCY, FRACTURES): Vit D, 25-Hydroxy: 46 ng/mL (ref 30–100)

## 2015-04-11 LAB — HEMOGLOBIN A1C
Hgb A1c MFr Bld: 7.4 % — ABNORMAL HIGH (ref <5.7)
Mean Plasma Glucose: 166 mg/dL — ABNORMAL HIGH (ref <117)

## 2015-04-11 LAB — MAGNESIUM: Magnesium: 1.6 mg/dL (ref 1.5–2.5)

## 2015-05-10 DIAGNOSIS — H35371 Puckering of macula, right eye: Secondary | ICD-10-CM | POA: Diagnosis not present

## 2015-05-10 DIAGNOSIS — E11351 Type 2 diabetes mellitus with proliferative diabetic retinopathy with macular edema: Secondary | ICD-10-CM | POA: Diagnosis not present

## 2015-05-10 DIAGNOSIS — E11359 Type 2 diabetes mellitus with proliferative diabetic retinopathy without macular edema: Secondary | ICD-10-CM | POA: Diagnosis not present

## 2015-05-14 DIAGNOSIS — E11351 Type 2 diabetes mellitus with proliferative diabetic retinopathy with macular edema: Secondary | ICD-10-CM | POA: Diagnosis not present

## 2015-06-12 DIAGNOSIS — M7061 Trochanteric bursitis, right hip: Secondary | ICD-10-CM | POA: Diagnosis not present

## 2015-06-25 DIAGNOSIS — E113511 Type 2 diabetes mellitus with proliferative diabetic retinopathy with macular edema, right eye: Secondary | ICD-10-CM | POA: Diagnosis not present

## 2015-06-28 DIAGNOSIS — E113512 Type 2 diabetes mellitus with proliferative diabetic retinopathy with macular edema, left eye: Secondary | ICD-10-CM | POA: Diagnosis not present

## 2015-07-30 DIAGNOSIS — E113511 Type 2 diabetes mellitus with proliferative diabetic retinopathy with macular edema, right eye: Secondary | ICD-10-CM | POA: Diagnosis not present

## 2015-07-31 ENCOUNTER — Encounter: Payer: Self-pay | Admitting: Internal Medicine

## 2015-08-01 ENCOUNTER — Encounter: Payer: Self-pay | Admitting: Neurology

## 2015-08-01 ENCOUNTER — Ambulatory Visit (INDEPENDENT_AMBULATORY_CARE_PROVIDER_SITE_OTHER): Payer: Medicare Other | Admitting: Neurology

## 2015-08-01 VITALS — BP 142/74 | HR 79 | Ht 63.0 in | Wt 179.5 lb

## 2015-08-01 DIAGNOSIS — R269 Unspecified abnormalities of gait and mobility: Secondary | ICD-10-CM | POA: Diagnosis not present

## 2015-08-01 DIAGNOSIS — M21371 Foot drop, right foot: Secondary | ICD-10-CM

## 2015-08-01 DIAGNOSIS — E1142 Type 2 diabetes mellitus with diabetic polyneuropathy: Secondary | ICD-10-CM

## 2015-08-01 DIAGNOSIS — M21372 Foot drop, left foot: Secondary | ICD-10-CM

## 2015-08-01 MED ORDER — DULOXETINE HCL 30 MG PO CPEP: ORAL_CAPSULE | ORAL | Status: DC

## 2015-08-01 NOTE — Patient Instructions (Signed)
Fall Prevention in the Home  Falls can cause injuries and can affect people from all age groups. There are many simple things that you can do to make your home safe and to help prevent falls. WHAT CAN I DO ON THE OUTSIDE OF MY HOME?  Regularly repair the edges of walkways and driveways and fix any cracks.  Remove high doorway thresholds.  Trim any shrubbery on the main path into your home.  Use bright outdoor lighting.  Clear walkways of debris and clutter, including tools and rocks.  Regularly check that handrails are securely fastened and in good repair. Both sides of any steps should have handrails.  Install guardrails along the edges of any raised decks or porches.  Have leaves, snow, and ice cleared regularly.  Use sand or salt on walkways during winter months.  In the garage, clean up any spills right away, including grease or oil spills. WHAT CAN I DO IN THE BATHROOM?  Use night lights.  Install grab bars by the toilet and in the tub and shower. Do not use towel bars as grab bars.  Use non-skid mats or decals on the floor of the tub or shower.  If you need to sit down while you are in the shower, use a plastic, non-slip stool..  Keep the floor dry. Immediately clean up any water that spills on the floor.  Remove soap buildup in the tub or shower on a regular basis.  Attach bath mats securely with double-sided non-slip rug tape.  Remove throw rugs and other tripping hazards from the floor. WHAT CAN I DO IN THE BEDROOM?  Use night lights.  Make sure that a bedside light is easy to reach.  Do not use oversized bedding that drapes onto the floor.  Have a firm chair that has side arms to use for getting dressed.  Remove throw rugs and other tripping hazards from the floor. WHAT CAN I DO IN THE KITCHEN?   Clean up any spills right away.  Avoid walking on wet floors.  Place frequently used items in easy-to-reach places.  If you need to reach for something  above you, use a sturdy step stool that has a grab bar.  Keep electrical cables out of the way.  Do not use floor polish or wax that makes floors slippery. If you have to use wax, make sure that it is non-skid floor wax.  Remove throw rugs and other tripping hazards from the floor. WHAT CAN I DO IN THE STAIRWAYS?  Do not leave any items on the stairs.  Make sure that there are handrails on both sides of the stairs. Fix handrails that are broken or loose. Make sure that handrails are as long as the stairways.  Check any carpeting to make sure that it is firmly attached to the stairs. Fix any carpet that is loose or worn.  Avoid having throw rugs at the top or bottom of stairways, or secure the rugs with carpet tape to prevent them from moving.  Make sure that you have a light switch at the top of the stairs and the bottom of the stairs. If you do not have them, have them installed. WHAT ARE SOME OTHER FALL PREVENTION TIPS?  Wear closed-toe shoes that fit well and support your feet. Wear shoes that have rubber soles or low heels.  When you use a stepladder, make sure that it is completely opened and that the sides are firmly locked. Have someone hold the ladder while you   are using it. Do not climb a closed stepladder.  Add color or contrast paint or tape to grab bars and handrails in your home. Place contrasting color strips on the first and last steps.  Use mobility aids as needed, such as canes, walkers, scooters, and crutches.  Turn on lights if it is dark. Replace any light bulbs that burn out.  Set up furniture so that there are clear paths. Keep the furniture in the same spot.  Fix any uneven floor surfaces.  Choose a carpet design that does not hide the edge of steps of a stairway.  Be aware of any and all pets.  Review your medicines with your healthcare provider. Some medicines can cause dizziness or changes in blood pressure, which increase your risk of falling. Talk  with your health care provider about other ways that you can decrease your risk of falls. This may include working with a physical therapist or trainer to improve your strength, balance, and endurance.   This information is not intended to replace advice given to you by your health care provider. Make sure you discuss any questions you have with your health care provider.   Document Released: 07/25/2002 Document Revised: 12/19/2014 Document Reviewed: 09/08/2014 Elsevier Interactive Patient Education 2016 [employer]  

## 2015-08-01 NOTE — Progress Notes (Signed)
Reason for visit: Peripheral neuropathy  Grace [name] is an 64 y.o. female  History of present illness:  Grace [name] is a 64 year old right-handed white female with a history of diabetes with a diabetic peripheral neuropathy. The patient has a significant neuropathy with bilateral foot drops, she is wearing AFO braces bilaterally which have helped her significantly, she no longer has to use a cane. She does have an occasional fall, but in general her balance has been much better. The patient is on low-dose Cymbalta taking 30 mg daily, she indicates that this is helpful for her pain control. The patient is sleeping well at night. The patient has macular edema, she is followed by Dr. [name] for this. She uses a walker at night when she uses the bathroom. She returns to this office for an evaluation. She has been running hemoglobin A1c values of around 7.  Past Medical History  Diagnosis Date  . Diabetes ([place])   . Peripheral edema   . Diabetic peripheral neuropathy ([place])   . Diabetic retinopathy ([place])   . Polyneuropathy in diabetes(357.2) 07/28/2013  . Abnormality of gait 07/28/2013  . Foot drop, bilateral 07/31/2014    Past Surgical History  Procedure Laterality Date  . Tonsillectomy    . Abdominal hysterectomy    . Hip surgery      right  . Ankle fracture surgery      left  . Cataract extraction, bilateral      Family History  Problem Relation Age of Onset  . Diabetes Mother   . Diabetes Brother   . Neuropathy Neg Hx     Social history:  reports that she has never smoked. She has never used smokeless tobacco. She reports that she does not drink alcohol or use illicit drugs.    Allergies  Allergen Reactions  . Gabapentin Swelling    Swelling in legs and feet    Medications:  Prior to Admission medications   Medication Sig Start Date End Date Taking? Authorizing Provider  Calcium-Phosphorus-Vitamin D (CITRACAL +D3 PO) Take by mouth daily.   Yes Historical  Provider, MD  DULoxetine (CYMBALTA) 30 MG capsule TAKE 1 CAPSULE (30 MG TOTAL) BY MOUTH DAILY. 09/01/14  Yes Kathrynn Ducking, MD  glimepiride (AMARYL) 2 MG tablet TAKE 1/2 TO 1 TABLET DAILY FOR DIABETES 04/08/15  Yes Unk Pinto, MD  Magnesium 250 MG TABS Take 500 mg by mouth daily.   Yes Historical Provider, MD  metFORMIN (GLUCOPHAGE-XR) 500 MG 24 hr tablet TAKE 1 TO 2 TABLETS TWICE DAILY WITH FOOD AS DIRECTED FOR DIABETES 04/10/15  Yes Courtney Forcucci, PA-C  metoCLOPramide (REGLAN) 10 MG tablet Take 1 tablet (10 mg total) by mouth 3 (three) times daily with meals. 04/10/15 04/09/16 Yes Courtney Forcucci, PA-C    ROS:  Out of a complete 14 system review of symptoms, the patient complains only of the following symptoms, and all other reviewed systems are negative.  Runny nose Loss of vision Swollen abdomen Snoring Walking difficulty  Blood pressure 142/74, pulse 79, height 5\' 3"  (1.6 m), weight 179 lb 8 oz (81.421 kg).  Physical Exam  General: The patient is alert and cooperative at the time of the examination.  Skin: No significant peripheral edema is noted.   Neurologic Exam  Mental status: The patient is alert and oriented x 3 at the time of the examination. The patient has apparent normal recent and remote memory, with an apparently normal attention span and concentration ability.   Cranial  nerves: Facial symmetry is present. Speech is normal, no aphasia or dysarthria is noted. Extraocular movements are full. Visual fields are full.  Motor: The patient has good strength in all 4 extremities, with exception of bilateral foot drops. The patient is wearing AFO braces bilaterally today.  Sensory examination: Soft touch sensation is symmetric on the face, arms, and legs.  Coordination: The patient has good finger-nose-finger and heel-to-shin bilaterally.  Gait and station: The patient has a slightly wide-based gait, gait is unsteady, but the patient is able to ambulate  independently. Tandem gait was not attempted. Romberg is negative.  Reflexes: Deep tendon reflexes are symmetric, but are depressed.   Assessment/Plan:  1. Diabetic peripheral neuropathy  2. Bilateral foot drops  3. Gait disturbance  The patient is doing fairly well with pain control with the Cymbalta, and with balance using the AFO braces. The patient was given a prescription for the Cymbalta, she will follow-up in one year or as needed.  Jill Alexanders MD 08/01/2015 6:51 PM  [facility] [address] [place], [place] [address]  Phone [phone] Fax [phone]

## 2015-08-02 DIAGNOSIS — E113512 Type 2 diabetes mellitus with proliferative diabetic retinopathy with macular edema, left eye: Secondary | ICD-10-CM | POA: Diagnosis not present

## 2015-08-28 ENCOUNTER — Other Ambulatory Visit: Payer: Self-pay | Admitting: Internal Medicine

## 2015-09-10 DIAGNOSIS — E113511 Type 2 diabetes mellitus with proliferative diabetic retinopathy with macular edema, right eye: Secondary | ICD-10-CM | POA: Diagnosis not present

## 2015-09-19 ENCOUNTER — Encounter: Payer: Self-pay | Admitting: Internal Medicine

## 2015-09-19 DIAGNOSIS — H35372 Puckering of macula, left eye: Secondary | ICD-10-CM | POA: Diagnosis not present

## 2015-09-19 DIAGNOSIS — H547 Unspecified visual loss: Secondary | ICD-10-CM | POA: Diagnosis not present

## 2015-09-19 DIAGNOSIS — E113512 Type 2 diabetes mellitus with proliferative diabetic retinopathy with macular edema, left eye: Secondary | ICD-10-CM | POA: Diagnosis not present

## 2015-09-19 DIAGNOSIS — H3581 Retinal edema: Secondary | ICD-10-CM | POA: Diagnosis not present

## 2015-09-23 ENCOUNTER — Other Ambulatory Visit: Payer: Self-pay | Admitting: Neurology

## 2015-09-27 DIAGNOSIS — E113512 Type 2 diabetes mellitus with proliferative diabetic retinopathy with macular edema, left eye: Secondary | ICD-10-CM | POA: Diagnosis not present

## 2015-09-27 DIAGNOSIS — Z09 Encounter for follow-up examination after completed treatment for conditions other than malignant neoplasm: Secondary | ICD-10-CM | POA: Diagnosis not present

## 2015-09-29 ENCOUNTER — Other Ambulatory Visit: Payer: Self-pay | Admitting: Neurology

## 2015-10-01 ENCOUNTER — Other Ambulatory Visit: Payer: Self-pay | Admitting: Neurology

## 2015-10-02 ENCOUNTER — Telehealth: Payer: Self-pay | Admitting: Neurology

## 2015-10-02 NOTE — Telephone Encounter (Signed)
It appears a one year Rx was sent at [facility] in Dec.  I called the pharmacy.  Spoke with Pharmacist., Cristie Hem, who said patient still has 3 refills remaining on Rx, and they will fill it today.  I called the patient back to relay this info.  They expressed understanding and appreciation.

## 2015-10-02 NOTE — Telephone Encounter (Signed)
Pt's husband called requesting refill for DULoxetine (CYMBALTA) 30 MG capsule . Husband said she has 1 day left. He called pharmacy last Saturday for refill and was told it had expired and would need to fax it to provider. Said he checked with pharmacy yesterday and they were to fax again to [place] . He checked with [facility] today and was told they have not rec'd anything from [place].

## 2015-10-08 DIAGNOSIS — E113511 Type 2 diabetes mellitus with proliferative diabetic retinopathy with macular edema, right eye: Secondary | ICD-10-CM | POA: Diagnosis not present

## 2015-10-08 DIAGNOSIS — E113512 Type 2 diabetes mellitus with proliferative diabetic retinopathy with macular edema, left eye: Secondary | ICD-10-CM | POA: Diagnosis not present

## 2015-10-18 ENCOUNTER — Encounter: Payer: Self-pay | Admitting: Internal Medicine

## 2015-10-18 ENCOUNTER — Ambulatory Visit (INDEPENDENT_AMBULATORY_CARE_PROVIDER_SITE_OTHER): Payer: Medicare Other | Admitting: Internal Medicine

## 2015-10-18 VITALS — BP 140/82 | HR 68 | Temp 97.7°F | Resp 16 | Ht 65.0 in | Wt 180.0 lb

## 2015-10-18 DIAGNOSIS — N182 Chronic kidney disease, stage 2 (mild): Secondary | ICD-10-CM | POA: Diagnosis not present

## 2015-10-18 DIAGNOSIS — E559 Vitamin D deficiency, unspecified: Secondary | ICD-10-CM

## 2015-10-18 DIAGNOSIS — Z0001 Encounter for general adult medical examination with abnormal findings: Secondary | ICD-10-CM | POA: Diagnosis not present

## 2015-10-18 DIAGNOSIS — E782 Mixed hyperlipidemia: Secondary | ICD-10-CM | POA: Diagnosis not present

## 2015-10-18 DIAGNOSIS — I10 Essential (primary) hypertension: Secondary | ICD-10-CM | POA: Diagnosis not present

## 2015-10-18 DIAGNOSIS — E1122 Type 2 diabetes mellitus with diabetic chronic kidney disease: Secondary | ICD-10-CM | POA: Diagnosis not present

## 2015-10-18 DIAGNOSIS — E0842 Diabetes mellitus due to underlying condition with diabetic polyneuropathy: Secondary | ICD-10-CM | POA: Diagnosis not present

## 2015-10-18 DIAGNOSIS — E1129 Type 2 diabetes mellitus with other diabetic kidney complication: Secondary | ICD-10-CM | POA: Diagnosis not present

## 2015-10-18 DIAGNOSIS — Z1212 Encounter for screening for malignant neoplasm of rectum: Secondary | ICD-10-CM

## 2015-10-18 DIAGNOSIS — R6889 Other general symptoms and signs: Secondary | ICD-10-CM | POA: Diagnosis not present

## 2015-10-18 DIAGNOSIS — Z79899 Other long term (current) drug therapy: Secondary | ICD-10-CM | POA: Diagnosis not present

## 2015-10-18 NOTE — Progress Notes (Deleted)
Patient ID: Grace [name], female   DOB: Dec 02, 1950, 65 y.o.   MRN: [id number]  Annual Preventative Visit And Comprehensive Evaluation &  Examination  This very nice 65 y.o. MWF presents for a Preventative Visit & comprehensive evaluation and management of multiple medical co-morbidities.  Patient has been followed for HTN, T2_NIDDM w/ peripheral neuropathy and retinopathy, Hyperlipidemia and Vitamin D Deficiency. Patient also has hx/o GERD.     Patient has hx/o labile HTN and is monitored expectantly. Patient's BP has been controlled at home and patient denies any cardiac symptoms as chest pain, palpitations, shortness of breath, dizziness or ankle swelling. Today's BP: 140/82 mmHg    Patient's hyperlipidemia is controlled with diet and medications. Patient denies myalgias or other medication SE's. Last lipids were at goal with Cholesterol 203*; HDL 48; LDL 84;but with elevated Triglycerides 356 on 04/10/2015.   Patient has hx/o gestational Diabetes about 30 years ago during a 2sd pregnancy, then quiescent until circa 2005 with Metformin initiated for T2_NIDDM and patient denies reactive hypoglycemic symptoms, visual blurring, diabetic polys, but she does have peripheral sensory neuropathy with c/o numbness from the mid shins distally and she  has been followed by Dr Floyde Parkins. She also wears bilateral ankle braces for bilateral foot drop. Last A1c was 7.4% on 04/10/2015. Further she reports receiving monthly eye injections for 2-3 years by Dr [name] for wet Macular degeneration. She reports decreased VA in the Left eye.   Finally, patient has history of Vitamin D Deficiency of 26 in Jan 2016 and last Vitamin D was  46 on 04/10/2015.     Medication Sig  . DULoxetine  30 MG capsule TAKE 1 CAPSULE (30 MG TOTAL) BY MOUTH DAILY.  Marland Kitchen glimepiride  2 MG tablet TAKE 1/2 TO 1 TABLET BY MOUTH DAILY FOR DIABETES - usu w/supper daily  . metFORMIN-XR 500 MG 24 hr tablet TAKE  2 TABLETS TWICE DAILY WITH FOOD  AS DIRECTED FOR DIABETES  . metoCLOPramide10 MG tablet Take 1 tablet  3 times daily with meals.   Allergies  Allergen Reactions  . Gabapentin Swelling    Swelling in legs and feet   Past Medical History  Diagnosis Date  . Diabetes ([place])   . Peripheral edema   . Diabetic peripheral neuropathy ([place])   . Diabetic retinopathy ([place])   . Polyneuropathy in diabetes(357.2) 07/28/2013  . Abnormality of gait 07/28/2013  . Foot drop, bilateral 07/31/2014   Health Maintenance  Topic Date Due  . Hepatitis C Screening  [birth year]  . OPHTHALMOLOGY EXAM  05/14/1960  . HIV Screening  05/14/1965  . TETANUS/TDAP  05/14/1969  . PAP SMEAR  05/15/1971  . COLONOSCOPY  05/14/2000  . ZOSTAVAX  05/14/2010  . MAMMOGRAM  05/26/2014  . INFLUENZA VACCINE  03/19/2015  . DEXA SCAN  05/15/2015  . PNA vac Low Risk Adult (1 of 2 - PCV13) 05/15/2015  . FOOT EXAM  08/31/2015  . URINE MICROALBUMIN  08/31/2015  . HEMOGLOBIN A1C  10/11/2015   Past Surgical History  Procedure Laterality Date  . Tonsillectomy    . Abdominal hysterectomy    . Hip surgery      right  . Ankle fracture surgery      left  . Cataract extraction, bilateral     Family History  Problem Relation Age of Onset  . Diabetes Mother   . Diabetes Brother   . Neuropathy Neg Hx    Social History  Substance Use Topics  . Smoking status: Never  Smoker   . Smokeless tobacco: Never Used  . Alcohol Use: No    ROS Constitutional: Denies fever, chills, weight loss/gain, headaches, insomnia,  night sweats, and change in appetite. Does c/o fatigue. Eyes: Denies redness, diplopia, discharge, itchy, watery eyes. C/o decreased VA left eye. ENT: Denies discharge, congestion, post nasal drip, epistaxis, sore throat, earache, hearing loss, dental pain, Tinnitus, Vertigo, Sinus pain, snoring.  Cardio: Denies chest pain, palpitations, irregular heartbeat, syncope, dyspnea, diaphoresis, orthopnea, PND, claudication, edema Respiratory: denies cough,  dyspnea, DOE, pleurisy, hoarseness, laryngitis, wheezing.  Gastrointestinal: Denies dysphagia, heartburn, reflux, water brash, pain, cramps, nausea, vomiting, bloating, diarrhea, constipation, hematemesis, melena, hematochezia, jaundice, hemorrhoids Genitourinary: Denies dysuria, frequency, urgency, nocturia, hesitancy, discharge, hematuria, flank pain Breast: Breast lumps, nipple discharge, bleeding.  Musculoskeletal: Denies arthralgia, myalgia, stiffness, Jt. Swelling, pain, limp, and strain/sprain. Denies falls. Skin: Denies puritis, rash, hives, warts, acne, eczema, changing in skin lesion Neuro: No weakness, tremor, incoordination, spasms. C/o numb paresthesia and decreased sensation of the distal LE's.  Psychiatric: Denies confusion, memory loss, sensory loss. Denies Depression. Endocrine: Denies change in weight, skin, hair change, nocturia, and paresthesia, diabetic polys, visual blurring, hyper / hypo glycemic episodes.  Heme/Lymph: No excessive bleeding, bruising, enlarged lymph nodes.  Physical Exam  BP 140/82 mmHg  Pulse 68  Temp(Src) 97.7 F (36.5 C)  Resp 16  Ht 5\' 5"  (1.651 m)  Wt 180 lb (81.647 kg)  BMI 29.95 kg/m2  General Appearance: Over nourished and in no apparent distress.  Eyes: PERRLA, EOMs, conjunctiva no swelling or erythema. Noted retinal scarring. Sinuses: No frontal/maxillary tenderness ENT/Mouth: EACs patent / TMs  nl. Nares clear without erythema, swelling, mucoid exudates. Oral hygiene is good. No erythema, swelling, or exudate. Tongue normal, non-obstructing. Tonsils not swollen or erythematous. Hearing normal.  Neck: Supple, thyroid normal. No bruits, nodes or JVD. Respiratory: Respiratory effort normal.  BS equal and clear bilateral without rales, rhonci, wheezing or stridor. Cardio: Heart sounds are normal with regular rate and rhythm and no murmurs, rubs or gallops. Peripheral pulses are normal and equal bilaterally without edema. No aortic or femoral  bruits. Chest: symmetric with normal excursions and percussion. Breasts: Symmetric, without lumps, nipple discharge, retractions, or fibrocystic changes.  Abdomen: Flat, soft, with bowl sounds. Nontender, no guarding, rebound, hernias, masses, or organomegaly.  Lymphatics: Non tender without lymphadenopathy.  Musculoskeletal: Full ROM all peripheral extremities, joint stability, 5/5 strength, and normal gait. Skin: Warm and dry without rashes, lesions, cyanosis, clubbing or  ecchymosis.  Neuro: Cranial nerves intact, DTR's flat thru-out. Normal muscle tone, no cerebellar symptoms. Sensation decreased to touch, Vibratoryand Monofilament from prox shin and distally bilaterally.   Pysch: Alert and oriented X 3, normal affect, Insight and Judgment appropriate.   Assessment and Plan  1. Annual Preventative Screening Examination   2. Essential hypertension  - EKG 12-Lead - [place], RETROPERITNL ABD,  LTD - TSH  3. Mixed hyperlipidemia  - Lipid panel - TSH  4. Type 2 diabetes mellitus with stage 2 chronic kidney disease, without long-term current use of insulin (HCC)  - Microalbumin / creatinine urine ratio - HM DIABETES FOOT EXAM - LOW EXTREMITY NEUR EXAM DOCUM - Hemoglobin A1c - Insulin, random  5. Vitamin D deficiency  - VITAMIN D 25 Hydroxy  6. Diabetic polyneuropathy associated with diabetes mellitus due to underlying condition ([place])   7. Screening for rectal cancer  - POC Hemoccult Bld/Stl   8. Medication management  - Urinalysis, Routine w reflex microscopic  - Uric acid -  CBC with Differential/Platelet - BASIC METABOLIC PANEL WITH GFR - Hepatic function panel - Magnesium   Continue prudent diet as discussed, weight control, BP monitoring, regular exercise, and medications. Discussed med's effects and SE's. Screening labs and tests as requested with regular follow-up as recommended. Over 40 minutes of exam, counseling, chart review and high complex critical decision  making was performed.

## 2015-10-18 NOTE — Patient Instructions (Signed)

## 2015-10-19 LAB — CBC WITH DIFFERENTIAL/PLATELET
Basophils Absolute: 0 10*3/uL (ref 0.0–0.1)
Basophils Relative: 0 % (ref 0–1)
Eosinophils Absolute: 0.2 10*3/uL (ref 0.0–0.7)
Eosinophils Relative: 3 % (ref 0–5)
HCT: 37 % (ref 36.0–46.0)
Hemoglobin: 12.1 g/dL (ref 12.0–15.0)
Lymphocytes Relative: 26 % (ref 12–46)
Lymphs Abs: 1.9 10*3/uL (ref 0.7–4.0)
MCH: 30.3 pg (ref 26.0–34.0)
MCHC: 32.7 g/dL (ref 30.0–36.0)
MCV: 92.5 fL (ref 78.0–100.0)
MPV: 8.8 fL (ref 8.6–12.4)
Monocytes Absolute: 0.6 10*3/uL (ref 0.1–1.0)
Monocytes Relative: 8 % (ref 3–12)
Neutro Abs: 4.6 10*3/uL (ref 1.7–7.7)
Neutrophils Relative %: 63 % (ref 43–77)
Platelets: 398 10*3/uL (ref 150–400)
RBC: 4 MIL/uL (ref 3.87–5.11)
RDW: 13.5 % (ref 11.5–15.5)
WBC: 7.3 10*3/uL (ref 4.0–10.5)

## 2015-10-19 LAB — BASIC METABOLIC PANEL WITH GFR
BUN: 28 mg/dL — ABNORMAL HIGH (ref 7–25)
CO2: 28 mmol/L (ref 20–31)
Calcium: 9.6 mg/dL (ref 8.6–10.4)
Chloride: 100 mmol/L (ref 98–110)
Creat: 0.9 mg/dL (ref 0.50–0.99)
GFR, Est African American: 78 mL/min (ref >=60)
GFR, Est Non African American: 67 mL/min (ref >=60)
Glucose, Bld: 186 mg/dL — ABNORMAL HIGH (ref 65–99)
Potassium: 5.5 mmol/L — ABNORMAL HIGH (ref 3.5–5.3)
Sodium: 138 mmol/L (ref 135–146)

## 2015-10-19 LAB — TSH: TSH: 1.22 mIU/L

## 2015-10-19 LAB — URINALYSIS, ROUTINE W REFLEX MICROSCOPIC
Bilirubin Urine: NEGATIVE
Hgb urine dipstick: NEGATIVE
Ketones, ur: NEGATIVE
Nitrite: NEGATIVE
Protein, ur: NEGATIVE
Specific Gravity, Urine: 1.025 (ref 1.001–1.035)
pH: 6 (ref 5.0–8.0)

## 2015-10-19 LAB — LIPID PANEL
Cholesterol: 207 mg/dL — ABNORMAL HIGH (ref 125–200)
HDL: 47 mg/dL (ref >=46)
LDL Cholesterol: 102 mg/dL (ref <130)
Total CHOL/HDL Ratio: 4.4 Ratio (ref <=5.0)
Triglycerides: 288 mg/dL — ABNORMAL HIGH (ref <150)
VLDL: 58 mg/dL — ABNORMAL HIGH (ref <30)

## 2015-10-19 LAB — HEPATIC FUNCTION PANEL
ALT: 16 U/L (ref 6–29)
AST: 15 U/L (ref 10–35)
Albumin: 3.9 g/dL (ref 3.6–5.1)
Alkaline Phosphatase: 61 U/L (ref 33–130)
Bilirubin, Direct: <0.1 mg/dL (ref <=0.2)
Total Bilirubin: 0.3 mg/dL (ref 0.2–1.2)
Total Protein: 6.2 g/dL (ref 6.1–8.1)

## 2015-10-19 LAB — MICROALBUMIN / CREATININE URINE RATIO
Creatinine, Urine: 148 mg/dL (ref 20–320)
Microalb Creat Ratio: 7 mcg/mg creat (ref <30)
Microalb, Ur: 1.1 mg/dL

## 2015-10-19 LAB — URINALYSIS, MICROSCOPIC ONLY
Bacteria, UA: NONE SEEN [HPF]
Casts: NONE SEEN [LPF]
Crystals: NONE SEEN [HPF]
RBC / HPF: NONE SEEN RBC/HPF (ref <=2)
Yeast: NONE SEEN [HPF]

## 2015-10-19 LAB — MAGNESIUM: Magnesium: 1.7 mg/dL (ref 1.5–2.5)

## 2015-10-19 LAB — HEMOGLOBIN A1C
Hgb A1c MFr Bld: 7.5 % — ABNORMAL HIGH (ref <5.7)
Mean Plasma Glucose: 169 mg/dL — ABNORMAL HIGH (ref <117)

## 2015-10-19 LAB — URIC ACID: Uric Acid, Serum: 6.2 mg/dL (ref 2.4–7.0)

## 2015-10-19 LAB — VITAMIN D 25 HYDROXY (VIT D DEFICIENCY, FRACTURES): Vit D, 25-Hydroxy: 56 ng/mL (ref 30–100)

## 2015-10-19 LAB — INSULIN, RANDOM: Insulin: 18 u[IU]/mL (ref 2.0–19.6)

## 2015-10-23 ENCOUNTER — Other Ambulatory Visit: Payer: Self-pay | Admitting: Internal Medicine

## 2015-11-02 ENCOUNTER — Other Ambulatory Visit: Payer: Self-pay | Admitting: Internal Medicine

## 2015-11-02 NOTE — Addendum Note (Signed)
Addended by: Unk Pinto on: 11/02/2015 02:28 PM   Modules accepted: Level of Service

## 2015-11-02 NOTE — Progress Notes (Addendum)
Patient ID: Grace [name], female   DOB: [birth year], 65 y.o.   MRN: [id number]  Welcome to Medicare Visit And  Comprehensive Evaluation & Examination   Assessment:   1. Encounter for general adult medical examination with abnormal findings   2. Essential hypertension  - EKG 12-Lead - [place], RETROPERITNL ABD,  LTD - TSH  3. Mixed hyperlipidemia - Lipid panel - TSH  4. Type 2 diabetes mellitus with stage 2 chronic kidney disease, without long-term current use of insulin (HCC)  - Microalbumin / creatinine urine ratio - HM DIABETES FOOT EXAM - LOW EXTREMITY NEUR EXAM DOCUM - Hemoglobin A1c - Insulin, random  5. Vitamin D deficiency  - VITAMIN D 25 Hydroxy (Vit-D Deficiency, Fractures)  6. Diabetic polyneuropathy associated with diabetes mellitus due to underlying condition ([place])   7. Screening for rectal cancer  - POC Hemoccult Bld/Stl (3-Cd Home Screen); Future  8. Medication management  - Urinalysis, Routine w reflex microscopic (not at [facility]) - Uric acid - CBC with Differential/Platelet - BASIC METABOLIC PANEL WITH GFR - Hepatic function panel - Magnesium    Plan:   During the course of the visit the patient was educated and counseled about appropriate screening and preventive services including:    Pneumococcal vaccine   Influenza vaccine  Td vaccine  Screening electrocardiogram  Bone densitometry screening  Colorectal cancer screening  Diabetes screening  Glaucoma screening  Nutrition counseling   Advanced directives: requested  Screening recommendations, referrals: Vaccinations:  Tdap vaccine 2010 Influenza vaccine 2015 Pneumococcal vaccine unknown Prevnar vaccine unknown Shingles vaccine 2012 Hep B vaccine not indicated  Nutrition assessed and recommended  Colonoscopy declined Recommended yearly ophthalmology/optometry visit for glaucoma screening and checkup Recommended yearly dental visit for hygiene and checkup Advanced  directives - None - offered forms  Conditions/risks identified: BMI: Discussed weight loss, diet, and increase physical activity.  Increase physical activity: AHA recommends 150 minutes of physical activity a week.  Medications reviewed Diabetes is not at goal, ACE/ARB therapy: none Urinary Incontinence is not an issue: discussed non pharmacology and pharmacology options.  Fall risk: low- discussed PT, home fall assessment, medications.   Subjective:      Grace [name] is a 65 y.o.  female who presents for a Welcome to Medicare  Visit and complete physical.  Patient has been followed for HTN, T2_NIDDM w/ peripheral neuropathy and retinopathy, Hyperlipidemia and Vitamin D Deficiency. Patient also has hx/o GERD.       Patient has hx/o labile HTN and is monitored expectantly. Patient's BP has been controlled at home and patient denies any cardiac symptoms as chest pain, palpitations, shortness of breath, dizziness or ankle swelling. Today's BP: 140/82 mmHg      Patient's hyperlipidemia is controlled with diet and medications. Patient denies myalgias or other medication SE's. Last lipids were at goal with Cholesterol 203*; HDL 48; LDL 84;but with elevated Triglycerides 356 on 04/10/2015.     Patient has hx/o gestational Diabetes about 30 years ago during a 2sd pregnancy, then quiescent until circa 2005 with Metformin initiated for T2_NIDDM and patient denies reactive hypoglycemic symptoms, visual blurring, diabetic polys, but she does have peripheral sensory neuropathy with c/o numbness from the mid shins distally and she  has been followed by Dr Floyde Parkins. She also wears bilateral ankle braces for bilateral foot drop. Last A1c was 7.4% on 04/10/2015. Further she reports receiving monthly eye injections for 2-3 years by Dr [name] for wet Macular degeneration. She reports decreased VA in the  Left eye.     Finally, patient has history of Vitamin D Deficiency of 26 in Jan 2016 and last Vitamin D  was  46 on 04/10/2015.   Names of Other Physician/Practitioners you currently use: 1. [facility] here for primary care 2. Dr [name] , eye doctor, last visit - 2016 3. ? Dentist , last visit 2016 & every 6 months  Patient Care Team: Unk Pinto, MD as PCP - General (Internal Medicine) Hurman Horn, MD as Consulting Physician (Ophthalmology) Kathrynn Ducking, MD as Consulting Physician (Neurology) Monna Fam, MD as Consulting Physician (Ophthalmology) Earnstine Regal, PA-C as Physician Assistant (Obstetrics and Gynecology)  Medication Review: Medication Sig  . DULoxetine  30 MG capsule TAKE 1 CAPSULE (30 MG TOTAL) BY MOUTH DAILY.  Marland Kitchen glimepiride  2 MG TAKE 1/2 TO 1 TABLET BY MOUTH DAILY FOR DIABETES  . metFORMIN-XR 500 MG 24 hr  TAKE 1 TO 2 TABLETS TWICE DAILY WITH FOOD AS DIRECTED FOR DIABETES   Allergies  Allergen Reactions  . Gabapentin Swelling    Swelling in legs and feet   Current Problems (verified) Patient Active Problem List   Diagnosis Date Noted  . T2_NIDDM 08/30/2014  . Mixed hyperlipidemia 08/30/2014  . Vitamin D deficiency 08/30/2014  . Medication management 08/30/2014  . Essential hypertension 08/30/2014  . Foot drop, bilateral 07/31/2014  . Diabetic polyneuropathy ([place]) 07/28/2013    Screening Tests Health Maintenance  Topic Date Due  . Hepatitis C Screening  07-27-1951  . OPHTHALMOLOGY EXAM  05/14/1960  . HIV Screening  05/14/1965  . TETANUS/TDAP  05/14/1969  . PAP SMEAR  05/15/1971  . COLONOSCOPY  05/14/2000  . ZOSTAVAX  05/14/2010  . MAMMOGRAM  05/26/2014  . INFLUENZA VACCINE  03/19/2015  . DEXA SCAN  05/15/2015  . PNA vac Low Risk Adult (1 of 2 - PCV13) 05/15/2015  . HEMOGLOBIN A1C  04/19/2016  . FOOT EXAM  10/17/2016  . URINE MICROALBUMIN  10/17/2016     There is no immunization history on file for this patient.  Preventative care: Last colonoscopy: declines  Past Medical History  Diagnosis  Date  . Diabetes ([place])   . Peripheral edema   . Diabetic peripheral neuropathy ([place])   . Diabetic retinopathy ([place])   . Polyneuropathy in diabetes(357.2) 07/28/2013  . Abnormality of gait 07/28/2013  . Foot drop, bilateral 07/31/2014   Past Surgical History  Procedure Laterality Date  . Tonsillectomy    . Abdominal hysterectomy    . Hip surgery      right  . Ankle fracture surgery      left  . Cataract extraction, bilateral      Risk Factors: Tobacco Social History  Substance Use Topics  . Smoking status: Never Smoker   . Smokeless tobacco: Never Used  . Alcohol Use: No   She does not smoke.  Patient is not a former smoker. Are there smokers in your home (other than you)?  No Alcohol Current alcohol use: none  Caffeine Current caffeine use: tea 4 cups /day  Exercise Current exercise: walking  Nutrition/Diet Current diet: in general, a "healthy" diet    Cardiac risk factors: advanced age (older than 73 for men, 5 for women), diabetes mellitus, dyslipidemia, hypertension, obesity (BMI >= 30 kg/m2) and sedentary lifestyle.  Depression Screen (Note: if answer to either of the following is "Yes", a more complete depression screening is indicated)   Q1: Over the past two weeks, have you felt down,  depressed or hopeless? No  Q2: Over the past two weeks, have you felt little interest or pleasure in doing things? No  Have you lost interest or pleasure in daily life? No  Do you often feel hopeless? No  Do you cry easily over simple problems? No  Activities of Daily Living In your present state of health, do you have any difficulty performing the following activities?:  Driving? No Managing money?  No Feeding yourself? No Getting from bed to chair? No Climbing a flight of stairs? No Preparing food and eating?: No Bathing or showering? No Getting dressed: No Getting to the toilet? No Using the toilet:No Moving around from place to place: No In the past year have  you fallen or had a near fall?:No   Are you sexually active?  No  Do you have more than one partner?  No  Vision Difficulties: No  Hearing Difficulties: No Do you often ask people to speak up or repeat themselves? No Do you experience ringing or noises in your ears? No Do you have difficulty understanding soft or whispered voices? Sometimes.  Cognition  Do you feel that you have a problem with memory?No  Do you often misplace items? No  Do you feel safe at home?  Yes  Advanced directives Does patient have a [place]? No - offered forms Does patient have a Living Will? No - offered forms  ROS: Constitutional: Denies fever, chills, weight loss/gain, headaches, insomnia, fatigue, night sweats, and change in appetite. Eyes: Denies redness, blurred vision, diplopia, discharge, itchy, watery eyes.  ENT: Denies discharge, congestion, post nasal drip, epistaxis, sore throat, earache, hearing loss, dental pain, Tinnitus, Vertigo, Sinus pain, snoring.  Cardio: Denies chest pain, palpitations, irregular heartbeat, syncope, dyspnea, diaphoresis, orthopnea, PND, claudication, edema Respiratory: denies cough, dyspnea, DOE, pleurisy, hoarseness, laryngitis, wheezing.  Gastrointestinal: Denies dysphagia, heartburn, reflux, water brash, pain, cramps, nausea, vomiting, bloating, diarrhea, constipation, hematemesis, melena, hematochezia, jaundice, hemorrhoids Genitourinary: Denies dysuria, frequency, urgency, nocturia, hesitancy, discharge, hematuria, flank pain Breast: Breast lumps, nipple discharge, bleeding.  Musculoskeletal: Denies arthralgia, myalgia, stiffness, Jt. Swelling, pain, limp, and strain/sprain. Denies falls. Skin: Denies puritis, rash, hives, warts, acne, eczema, changing in skin lesion Neuro: No weakness, tremor, incoordination, spasms, paresthesia, pain Psychiatric: Denies confusion, memory loss, sensory loss. Denies Depression. Endocrine: Denies change in  weight, skin, hair change, nocturia, and paresthesia, diabetic polys, visual blurring, hyper / hypo glycemic episodes.  Heme/Lymph: No excessive bleeding, bruising, enlarged lymph nodes  Objective:     BP 140/82 mmHg  Pulse 68  Temp(Src) 97.7 F (36.5 C)  Resp 16  Ht 5\' 5"  (1.651 m)  Wt 180 lb (81.647 kg)  BMI 29.95 kg/m2  General Appearance: Well nourished, alert, WD/WN, female and in no apparent distress. Eyes: PERRLA, EOMs, conjunctiva no swelling or erythema, normal fundi and vessels. Sinuses: No frontal/maxillary tenderness ENT/Mouth: EACs patent / TMs  nl. Nares clear without erythema, swelling, mucoid exudates. Oral hygiene is good. No erythema, swelling, or exudate. Tongue normal, non-obstructing. Tonsils not swollen or erythematous. Hearing normal.  Neck: Supple, thyroid normal. No bruits, nodes or JVD. Respiratory: Respiratory effort normal.  BS equal and clear bilateral without rales, rhonci, wheezing or stridor. Cardio: Heart sounds are normal with regular rate and rhythm and no murmurs, rubs or gallops. Peripheral pulses are normal and equal bilaterally without edema. No aortic or femoral bruits. Chest: symmetric with normal excursions and percussion. Breasts: Symmetric, without lumps, nipple discharge, retractions, or fibrocystic changes.  Abdomen:  Flat, soft  with nl bowel sounds. Nontender, no guarding, rebound, hernias, masses, or organomegaly.  Lymphatics: Non tender without lymphadenopathy.  Genitourinary:  Musculoskeletal: Full ROM all peripheral extremities, joint stability, 5/5 strength, and normal gait. Skin: Warm and dry without rashes, lesions, cyanosis, clubbing or  ecchymosis.  Neuro: Cranial nerves intact, reflexes equal bilaterally. Normal muscle tone, no cerebellar symptoms. Sensation intact to touch, Vibratory & monofilament to the toes bilaterally.  Pysch: Alert and oriented X 3, normal affect, Insight and Judgment appropriate.   Cognitive  Testing  Alert? Yes  Normal Appearance?Yes  Oriented to person? Yes  Place? Yes   Time? Yes  Recall of three objects?  Yes  Can perform simple calculations? Yes  Displays appropriate judgment? Yes  Can read the correct time from a watch/clock?Yes  Medicare Attestation I have personally reviewed: The patient's medical and social history Their use of alcohol, tobacco or illicit drugs Their current medications and supplements The patient's functional ability including ADLs,fall risks, home safety risks, cognitive, and hearing and visual impairment Diet and physical activities Evidence for depression or mood disorders  The patient's weight, height, BMI, and visual acuity have been recorded in the chart.  I have made referrals, counseling, and provided education to the patient based on review of the above and I have provided the patient with a written personalized care plan for preventive services.  Over 40 minutes of exam, counseling, chart review was performed.  Boone Gear DAVID, MD   11/02/2015

## 2015-11-08 DIAGNOSIS — Z09 Encounter for follow-up examination after completed treatment for conditions other than malignant neoplasm: Secondary | ICD-10-CM | POA: Diagnosis not present

## 2015-11-08 DIAGNOSIS — E113512 Type 2 diabetes mellitus with proliferative diabetic retinopathy with macular edema, left eye: Secondary | ICD-10-CM | POA: Diagnosis not present

## 2015-11-13 DIAGNOSIS — E113512 Type 2 diabetes mellitus with proliferative diabetic retinopathy with macular edema, left eye: Secondary | ICD-10-CM | POA: Diagnosis not present

## 2015-11-19 DIAGNOSIS — H353211 Exudative age-related macular degeneration, right eye, with active choroidal neovascularization: Secondary | ICD-10-CM | POA: Diagnosis not present

## 2015-11-19 DIAGNOSIS — E113511 Type 2 diabetes mellitus with proliferative diabetic retinopathy with macular edema, right eye: Secondary | ICD-10-CM | POA: Diagnosis not present

## 2015-11-19 DIAGNOSIS — E113512 Type 2 diabetes mellitus with proliferative diabetic retinopathy with macular edema, left eye: Secondary | ICD-10-CM | POA: Diagnosis not present

## 2015-11-19 DIAGNOSIS — H35371 Puckering of macula, right eye: Secondary | ICD-10-CM | POA: Diagnosis not present

## 2015-11-22 ENCOUNTER — Other Ambulatory Visit: Payer: Self-pay | Admitting: Internal Medicine

## 2015-12-04 ENCOUNTER — Other Ambulatory Visit: Payer: Self-pay | Admitting: Internal Medicine

## 2015-12-18 DIAGNOSIS — H35371 Puckering of macula, right eye: Secondary | ICD-10-CM | POA: Diagnosis not present

## 2015-12-18 DIAGNOSIS — E113512 Type 2 diabetes mellitus with proliferative diabetic retinopathy with macular edema, left eye: Secondary | ICD-10-CM | POA: Diagnosis not present

## 2015-12-18 DIAGNOSIS — E113511 Type 2 diabetes mellitus with proliferative diabetic retinopathy with macular edema, right eye: Secondary | ICD-10-CM | POA: Diagnosis not present

## 2016-01-02 ENCOUNTER — Other Ambulatory Visit: Payer: Self-pay | Admitting: Internal Medicine

## 2016-01-02 DIAGNOSIS — E113511 Type 2 diabetes mellitus with proliferative diabetic retinopathy with macular edema, right eye: Secondary | ICD-10-CM | POA: Diagnosis not present

## 2016-01-02 DIAGNOSIS — R609 Edema, unspecified: Secondary | ICD-10-CM

## 2016-01-02 DIAGNOSIS — H35371 Puckering of macula, right eye: Secondary | ICD-10-CM | POA: Diagnosis not present

## 2016-01-02 DIAGNOSIS — E113512 Type 2 diabetes mellitus with proliferative diabetic retinopathy with macular edema, left eye: Secondary | ICD-10-CM | POA: Diagnosis not present

## 2016-01-02 MED ORDER — HYDROCHLOROTHIAZIDE 25 MG PO TABS: ORAL_TABLET | ORAL | Status: DC

## 2016-01-02 NOTE — Progress Notes (Signed)
Phone call from Dr [name] re: worsening Cystoid Macular Edema and rec consideration of mild diuretic therapy. Rx - HCTZ 25 sent in and pt notified and recommended f/u OV in 1 month to check renal functions.

## 2016-01-03 DIAGNOSIS — S7001XA Contusion of right hip, initial encounter: Secondary | ICD-10-CM | POA: Diagnosis not present

## 2016-01-22 ENCOUNTER — Ambulatory Visit: Payer: Self-pay | Admitting: Internal Medicine

## 2016-01-29 DIAGNOSIS — E113512 Type 2 diabetes mellitus with proliferative diabetic retinopathy with macular edema, left eye: Secondary | ICD-10-CM | POA: Diagnosis not present

## 2016-02-06 DIAGNOSIS — E113511 Type 2 diabetes mellitus with proliferative diabetic retinopathy with macular edema, right eye: Secondary | ICD-10-CM | POA: Diagnosis not present

## 2016-02-07 ENCOUNTER — Encounter: Payer: Self-pay | Admitting: Internal Medicine

## 2016-02-07 ENCOUNTER — Ambulatory Visit (INDEPENDENT_AMBULATORY_CARE_PROVIDER_SITE_OTHER): Payer: Medicare Other | Admitting: Internal Medicine

## 2016-02-07 VITALS — BP 124/62 | HR 70 | Temp 97.8°F | Resp 16 | Ht 65.0 in | Wt 175.0 lb

## 2016-02-07 DIAGNOSIS — Z23 Encounter for immunization: Secondary | ICD-10-CM | POA: Diagnosis not present

## 2016-02-07 DIAGNOSIS — E1122 Type 2 diabetes mellitus with diabetic chronic kidney disease: Secondary | ICD-10-CM | POA: Diagnosis not present

## 2016-02-07 DIAGNOSIS — M21372 Foot drop, left foot: Secondary | ICD-10-CM | POA: Diagnosis not present

## 2016-02-07 DIAGNOSIS — E0842 Diabetes mellitus due to underlying condition with diabetic polyneuropathy: Secondary | ICD-10-CM

## 2016-02-07 DIAGNOSIS — E559 Vitamin D deficiency, unspecified: Secondary | ICD-10-CM

## 2016-02-07 DIAGNOSIS — E782 Mixed hyperlipidemia: Secondary | ICD-10-CM

## 2016-02-07 DIAGNOSIS — R300 Dysuria: Secondary | ICD-10-CM

## 2016-02-07 DIAGNOSIS — N182 Chronic kidney disease, stage 2 (mild): Secondary | ICD-10-CM | POA: Diagnosis not present

## 2016-02-07 DIAGNOSIS — M21371 Foot drop, right foot: Secondary | ICD-10-CM | POA: Diagnosis not present

## 2016-02-07 DIAGNOSIS — I10 Essential (primary) hypertension: Secondary | ICD-10-CM

## 2016-02-07 DIAGNOSIS — Z79899 Other long term (current) drug therapy: Secondary | ICD-10-CM | POA: Diagnosis not present

## 2016-02-07 DIAGNOSIS — Z0000 Encounter for general adult medical examination without abnormal findings: Secondary | ICD-10-CM | POA: Diagnosis not present

## 2016-02-07 LAB — LIPID PANEL
Cholesterol: 170 mg/dL (ref 125–200)
HDL: 52 mg/dL (ref >=46)
LDL Cholesterol: 73 mg/dL (ref <130)
Total CHOL/HDL Ratio: 3.3 Ratio (ref <=5.0)
Triglycerides: 227 mg/dL — ABNORMAL HIGH (ref <150)
VLDL: 45 mg/dL — ABNORMAL HIGH (ref <30)

## 2016-02-07 LAB — BASIC METABOLIC PANEL WITH GFR
BUN: 27 mg/dL — ABNORMAL HIGH (ref 7–25)
CO2: 31 mmol/L (ref 20–31)
Calcium: 9.8 mg/dL (ref 8.6–10.4)
Chloride: 99 mmol/L (ref 98–110)
Creat: 1.08 mg/dL — ABNORMAL HIGH (ref 0.50–0.99)
GFR, Est African American: 62 mL/min (ref >=60)
GFR, Est Non African American: 54 mL/min — ABNORMAL LOW (ref >=60)
Glucose, Bld: 127 mg/dL — ABNORMAL HIGH (ref 65–99)
Potassium: 4.4 mmol/L (ref 3.5–5.3)
Sodium: 139 mmol/L (ref 135–146)

## 2016-02-07 LAB — CBC WITH DIFFERENTIAL/PLATELET
Basophils Absolute: 0 cells/uL (ref 0–200)
Basophils Relative: 0 %
Eosinophils Absolute: 234 cells/uL (ref 15–500)
Eosinophils Relative: 3 %
HCT: 37.1 % (ref 35.0–45.0)
Hemoglobin: 12.3 g/dL (ref 11.7–15.5)
Lymphocytes Relative: 27 %
Lymphs Abs: 2106 cells/uL (ref 850–3900)
MCH: 30.4 pg (ref 27.0–33.0)
MCHC: 33.2 g/dL (ref 32.0–36.0)
MCV: 91.6 fL (ref 80.0–100.0)
MPV: 9.1 fL (ref 7.5–12.5)
Monocytes Absolute: 546 cells/uL (ref 200–950)
Monocytes Relative: 7 %
Neutro Abs: 4914 cells/uL (ref 1500–7800)
Neutrophils Relative %: 63 %
Platelets: 367 10*3/uL (ref 140–400)
RBC: 4.05 MIL/uL (ref 3.80–5.10)
RDW: 13.8 % (ref 11.0–15.0)
WBC: 7.8 10*3/uL (ref 3.8–10.8)

## 2016-02-07 LAB — HEPATIC FUNCTION PANEL
ALT: 15 U/L (ref 6–29)
AST: 16 U/L (ref 10–35)
Albumin: 3.9 g/dL (ref 3.6–5.1)
Alkaline Phosphatase: 61 U/L (ref 33–130)
Bilirubin, Direct: 0.1 mg/dL (ref <=0.2)
Indirect Bilirubin: 0.2 mg/dL (ref 0.2–1.2)
Total Bilirubin: 0.3 mg/dL (ref 0.2–1.2)
Total Protein: 6.2 g/dL (ref 6.1–8.1)

## 2016-02-07 LAB — TSH: TSH: 1.65 mIU/L

## 2016-02-07 NOTE — Patient Instructions (Signed)
Please call either [facility] or the [facility] to get a screening mammogram set up.  I will put in an order for you to get your bone density testing scheduled as well.  Hopefully you can do these both on the same day.  Please check your cologuard to see if it is expired.  Let me know if it is and I will get a new test sent to you.   I will call with your labs tomorrow.

## 2016-02-07 NOTE — Progress Notes (Signed)
WELCOME TO MEDICARE ANNUAL WELLNESS VISIT AND FOLLOW UP  Assessment:    1. Essential hypertension -well controlled -cont meds -dash diet -exercise as tolerated -monitor at home - TSH  2. Type 2 diabetes mellitus with stage 2 chronic kidney disease, without long-term current use of insulin (HCC) -not at goal -consider increasing amaryl - Hemoglobin A1c  3. Diabetic polyneuropathy associated with diabetes mellitus due to underlying condition ([place]) -see above -patient offered PT which she declined currently - Hemoglobin A1c  4. Foot drop, bilateral -cont use of braces -PT offered which patient declined -contributing to high fall risk  5. Mixed hyperlipidemia -at goal - Lipid panel  6. Vitamin D deficiency -cont Vit D   7. Medication management  - CBC with Differential/Platelet - BASIC METABOLIC PANEL WITH GFR - Hepatic function panel  8. Need for prophylactic vaccination with combined diphtheria-tetanus-pertussis (DTP) vaccine -updated today  9. Need for prophylactic vaccination against Streptococcus pneumoniae (pneumococcus) -updated today - Pneumococcal conjugate vaccine 13-valent IM  10. Dysuria -abx if positive - Urinalysis, Routine w reflex microscopic (not at [facility]) - Culture, Urine  11. Need for prophylactic vaccination with tetanus-diphtheria (TD)  - Td : Tetanus/diphtheria >7yo Preservative  free     Over 30 minutes of exam, counseling, chart review, and critical decision making was performed  Future Appointments Date Time Provider [place]  04/23/2016 4:30 PM Unk Pinto, MD [facility] None  07/31/2016 3:30 PM Ward Givens, NP [facility] None  11/27/2016 3:00 PM Unk Pinto, MD [facility] None    Plan:   During the course of the visit the patient was educated and counseled about appropriate screening and preventive services including:    Pneumococcal vaccine   Influenza vaccine  Td vaccine  Prevnar 13  Screening  electrocardiogram  Screening mammography  Bone densitometry screening  Colorectal cancer screening  Diabetes screening  Glaucoma screening  Nutrition counseling   Advanced directives: given info/requested copies   Subjective:   Grace [name] is a 65 y.o. female who presents for Medicare Annual Wellness Visit and 3 month follow up on hypertension, diabetes, hyperlipidemia, vitamin D def.   Her blood pressure has been controlled at home, today their BP is BP: 124/62 mmHg She does not workout. She denies chest pain, shortness of breath, dizziness.  She is not on cholesterol medication and denies myalgias. Her cholesterol is at goal. The cholesterol last visit was:   Lab Results  Component Value Date   CHOL 170 02/07/2016   HDL 52 02/07/2016   LDLCALC 73 02/07/2016   TRIG 227* 02/07/2016   CHOLHDL 3.3 02/07/2016   She does have a history of diabetes managed with medication, diet, and exercise.  She is not at goal.  She does report worsening of blood sugars.  She also has neuropathy which is extensive and requires her to use a walker to avoid falls. She does fall frequently due to neuropathy and bilateral foot drop.     Lab Results  Component Value Date   HGBA1C 7.7* 02/07/2016  She notes that her blood sugars have been running a little bit higher.  Blood sugars are mostly under 120.  Only outlier 140 and 125.  She has not had any readings less than 70.    Last GFR Lab Results  Component Value Date   GFRNONAA 54* 02/07/2016   Lab Results  Component Value Date   GFRAA 62 02/07/2016   Patient is on Vitamin D supplement. Lab Results  Component Value Date  VD25OH 56 10/18/2015     She notes that she has been following with Dr. [name] for macular degeneration and macular edema.  He did have [place] start HCTZ for this purpose.  He does feel like her kidneys contribute to why her vision has changed so much despite very stable SCr, and eGFR.  Patient does report mild  burning and urinary frequency for  Several days.  She is prone to UTI.    Medication Review Current Outpatient Prescriptions on File Prior to Visit  Medication Sig Dispense Refill  . Calcium Citrate-Vitamin D (CITRACAL/VITAMIN D PO) Take 1 tablet by mouth daily.    . Cholecalciferol (VITAMIN D PO) Take 5,000 Units by mouth 2 (two) times daily. Reported on 02/07/2016    . DULoxetine (CYMBALTA) 30 MG capsule TAKE 1 CAPSULE (30 MG TOTAL) BY MOUTH DAILY. 90 capsule 3  . glimepiride (AMARYL) 2 MG tablet TAKE 1/2 TO 1 TABLET BY MOUTH DAILY FOR DIABETES 90 tablet 1  . hydrochlorothiazide (HYDRODIURIL) 25 MG tablet Take 1 tablet daily for fluid retention 90 tablet 1  . Magnesium 400 MG TABS Take 1 tablet by mouth daily.    . metFORMIN (GLUCOPHAGE-XR) 500 MG 24 hr tablet TAKE 1 TO 2 TABLETS TWICE DAILY WITH FOOD AS DIRECTED FOR DIABETES 360 tablet 1   No current facility-administered medications on file prior to visit.    Allergies: Allergies  Allergen Reactions  . Gabapentin Swelling    Swelling in legs and feet    Current Problems (verified) has Diabetic polyneuropathy ([place]); Foot drop, bilateral; T2_NIDDM; Mixed hyperlipidemia; Vitamin D deficiency; Medication management; and Essential hypertension on her problem list.  Screening Tests Immunization History  Administered Date(s) Administered  . Pneumococcal Conjugate-13 02/07/2016  . Td 02/07/2016    Preventative care: Last colonoscopy: Cologuard has been ordered Last mammogram:  Last pap smear/pelvic exam: Hysterctomy   DEXA: Ordered   Prior vaccinations: TD or Tdap: Due  Influenza: 2015  Pneumococcal: Declined Prevnar13: Given today Shingles/Zostavax: 2012  Names of Other Physician/Practitioners you currently use: 1. [facility]- here for primary care 2. Dr. [name], eye doctor, last visit 20173. Dr. Zenaida Niece, dentist, last visit 2017 Patient Care Team: Unk Pinto, MD as PCP -  General (Internal Medicine) Hurman Horn, MD as Consulting Physician (Ophthalmology) Kathrynn Ducking, MD as Consulting Physician (Neurology) Monna Fam, MD as Consulting Physician (Ophthalmology) Earnstine Regal, PA-C as Physician Assistant (Obstetrics and Gynecology)  Surgical: She  has past surgical history that includes Tonsillectomy; Abdominal hysterectomy; Hip surgery; Ankle fracture surgery; and Cataract extraction, bilateral. Family Her family history includes Diabetes in her brother and mother. There is no history of Neuropathy. Social history  She reports that she has never smoked. She has never used smokeless tobacco. She reports that she does not drink alcohol or use illicit drugs.  MEDICARE WELLNESS OBJECTIVES: Physical activity:   Cardiac risk factors:   Depression/mood screen:   Depression screen [facility] 2/9 02/07/2016  Decreased Interest 1  Down, Depressed, Hopeless 1  PHQ - 2 Score 2  Altered sleeping 0  Tired, decreased energy 0  Change in appetite 0  Feeling bad or failure about yourself  0  Trouble concentrating 0  Moving slowly or fidgety/restless 0  Suicidal thoughts 0  PHQ-9 Score 2  Difficult doing work/chores Not difficult at all    ADLs:  In your present state of health, do you have any difficulty performing the following activities: 02/07/2016 10/18/2015  Hearing? N N  Vision? Y N  Difficulty concentrating or making decisions? N N  Walking or climbing stairs? Y N  Dressing or bathing? N N  Doing errands, shopping? N N  Preparing Food and eating ? N -  Using the Toilet? N -  In the past six months, have you accidently leaked urine? Y -  Do you have problems with loss of bowel control? N -  Managing your Medications? N -  Managing your Finances? N -  Housekeeping or managing your Housekeeping? N -     Cognitive Testing  Alert? Yes  Normal Appearance?Yes  Oriented to person? Yes  Place? Yes   Time? Yes  Recall of three objects?  Yes  Can perform  simple calculations? Yes  Displays appropriate judgment?Yes  Can read the correct time from a watch face?Yes  EOL planning: Does patient have an advance directive?: No Would patient like information on creating an advanced directive?: Yes - Educational materials given   Objective:   Today's Vitals   02/07/16 1626  BP: 124/62  Pulse: 70  Temp: 97.8 F (36.6 C)  TempSrc: Temporal  Resp: 16  Height: 5' 5"  (1.651 m)  Weight: 175 lb (79.379 kg)   Body mass index is 29.12 kg/(m^2).  General appearance: alert, no distress, WD/WN,  female HEENT: normocephalic, sclerae anicteric, TMs pearly, nares patent, no discharge or erythema, pharynx normal Oral cavity: MMM, no lesions Neck: supple, no lymphadenopathy, no thyromegaly, no masses Heart: RRR, normal S1, S2, no murmurs Lungs: CTA bilaterally, no wheezes, rhonchi, or rales Abdomen: +bs, soft, non tender, non distended, no masses, no hepatomegaly, no splenomegaly Musculoskeletal: nontender, no swelling, no obvious deformity Extremities: no edema, no cyanosis, no clubbing Pulses: 2+ symmetric, upper and lower extremities, normal cap refill Neurological: alert, oriented x 3, CN2-12 intact, strength normal upper extremities and lower extremities, sensation normal throughout, DTRs 2+ throughout, no cerebellar signs, gait normal Psychiatric: normal affect, behavior normal, pleasant  Breast: defer Gyn: defer Rectal: defer   Medicare Attestation I have personally reviewed: The patient's medical and social history Their use of alcohol, tobacco or illicit drugs Their current medications and supplements The patient's functional ability including ADLs,fall risks, home safety risks, cognitive, and hearing and visual impairment Diet and physical activities Evidence for depression or mood disorders  The patient's weight, height, BMI, and visual acuity have been recorded in the chart.  I have made referrals, counseling, and provided education  to the patient based on review of the above and I have provided the patient with a written personalized care plan for preventive services.     Starlyn Skeans, PA-C   02/08/2016

## 2016-02-08 ENCOUNTER — Other Ambulatory Visit: Payer: Self-pay | Admitting: Internal Medicine

## 2016-02-08 LAB — URINALYSIS, MICROSCOPIC ONLY
Casts: NONE SEEN [LPF]
Crystals: NONE SEEN [HPF]
WBC, UA: >60 WBC/HPF — AB (ref <=5)
Yeast: NONE SEEN [HPF]

## 2016-02-08 LAB — URINALYSIS, ROUTINE W REFLEX MICROSCOPIC
Bilirubin Urine: NEGATIVE
Glucose, UA: NEGATIVE
Nitrite: NEGATIVE
Protein, ur: NEGATIVE
Specific Gravity, Urine: 1.02 (ref 1.001–1.035)
pH: 8 (ref 5.0–8.0)

## 2016-02-08 LAB — HEMOGLOBIN A1C
Hgb A1c MFr Bld: 7.7 % — ABNORMAL HIGH (ref <5.7)
Mean Plasma Glucose: 174 mg/dL

## 2016-02-08 MED ORDER — CEPHALEXIN 500 MG PO CAPS: 500.0000 mg | ORAL_CAPSULE | Freq: Three times a day (TID) | ORAL | Status: AC

## 2016-02-09 LAB — URINE CULTURE
Colony Count: NO GROWTH
Organism ID, Bacteria: NO GROWTH

## 2016-02-28 DIAGNOSIS — E113512 Type 2 diabetes mellitus with proliferative diabetic retinopathy with macular edema, left eye: Secondary | ICD-10-CM | POA: Diagnosis not present

## 2016-03-12 DIAGNOSIS — E113511 Type 2 diabetes mellitus with proliferative diabetic retinopathy with macular edema, right eye: Secondary | ICD-10-CM | POA: Diagnosis not present

## 2016-04-02 ENCOUNTER — Other Ambulatory Visit: Payer: Self-pay | Admitting: *Deleted

## 2016-04-02 DIAGNOSIS — Z1212 Encounter for screening for malignant neoplasm of rectum: Secondary | ICD-10-CM

## 2016-04-02 LAB — POC HEMOCCULT BLD/STL (HOME/3-CARD/SCREEN)
Card #2 Fecal Occult Blod, POC: NEGATIVE
Card #3 Fecal Occult Blood, POC: NEGATIVE
Fecal Occult Blood, POC: NEGATIVE

## 2016-04-03 DIAGNOSIS — H35371 Puckering of macula, right eye: Secondary | ICD-10-CM | POA: Diagnosis not present

## 2016-04-03 DIAGNOSIS — E113512 Type 2 diabetes mellitus with proliferative diabetic retinopathy with macular edema, left eye: Secondary | ICD-10-CM | POA: Diagnosis not present

## 2016-04-03 DIAGNOSIS — E113511 Type 2 diabetes mellitus with proliferative diabetic retinopathy with macular edema, right eye: Secondary | ICD-10-CM | POA: Diagnosis not present

## 2016-04-09 ENCOUNTER — Other Ambulatory Visit: Payer: Self-pay | Admitting: Internal Medicine

## 2016-04-09 DIAGNOSIS — Z1231 Encounter for screening mammogram for malignant neoplasm of breast: Secondary | ICD-10-CM

## 2016-04-18 ENCOUNTER — Ambulatory Visit
Admission: RE | Admit: 2016-04-18 | Discharge: 2016-04-18 | Disposition: A | Discharge status: Home / Self Care | Payer: Medicare Other | Source: Ambulatory Visit | Attending: Internal Medicine | Admitting: Internal Medicine

## 2016-04-18 DIAGNOSIS — Z1231 Encounter for screening mammogram for malignant neoplasm of breast: Secondary | ICD-10-CM

## 2016-04-23 ENCOUNTER — Ambulatory Visit: Payer: Self-pay | Admitting: Internal Medicine

## 2016-04-23 DIAGNOSIS — E113511 Type 2 diabetes mellitus with proliferative diabetic retinopathy with macular edema, right eye: Secondary | ICD-10-CM | POA: Diagnosis not present

## 2016-05-12 ENCOUNTER — Encounter: Payer: Self-pay | Admitting: Internal Medicine

## 2016-05-12 ENCOUNTER — Ambulatory Visit (INDEPENDENT_AMBULATORY_CARE_PROVIDER_SITE_OTHER): Payer: Medicare Other | Admitting: Internal Medicine

## 2016-05-12 VITALS — BP 122/80 | HR 72 | Temp 97.4°F | Resp 16 | Ht 65.0 in | Wt 175.4 lb

## 2016-05-12 DIAGNOSIS — E559 Vitamin D deficiency, unspecified: Secondary | ICD-10-CM

## 2016-05-12 DIAGNOSIS — E782 Mixed hyperlipidemia: Secondary | ICD-10-CM

## 2016-05-12 DIAGNOSIS — E1122 Type 2 diabetes mellitus with diabetic chronic kidney disease: Secondary | ICD-10-CM | POA: Diagnosis not present

## 2016-05-12 DIAGNOSIS — I10 Essential (primary) hypertension: Secondary | ICD-10-CM

## 2016-05-12 DIAGNOSIS — Z79899 Other long term (current) drug therapy: Secondary | ICD-10-CM

## 2016-05-12 DIAGNOSIS — Z23 Encounter for immunization: Secondary | ICD-10-CM | POA: Diagnosis not present

## 2016-05-12 DIAGNOSIS — N182 Chronic kidney disease, stage 2 (mild): Secondary | ICD-10-CM | POA: Diagnosis not present

## 2016-05-12 LAB — BASIC METABOLIC PANEL WITH GFR
BUN: 22 mg/dL (ref 7–25)
CO2: 31 mmol/L (ref 20–31)
Calcium: 9.5 mg/dL (ref 8.6–10.4)
Chloride: 99 mmol/L (ref 98–110)
Creat: 0.96 mg/dL (ref 0.50–0.99)
GFR, Est African American: 72 mL/min (ref >=60)
GFR, Est Non African American: 62 mL/min (ref >=60)
Glucose, Bld: 154 mg/dL — ABNORMAL HIGH (ref 65–99)
Potassium: 4.8 mmol/L (ref 3.5–5.3)
Sodium: 137 mmol/L (ref 135–146)

## 2016-05-12 LAB — CBC WITH DIFFERENTIAL/PLATELET
Basophils Absolute: 64 cells/uL (ref 0–200)
Basophils Relative: 1 %
Eosinophils Absolute: 320 cells/uL (ref 15–500)
Eosinophils Relative: 5 %
HCT: 38.4 % (ref 35.0–45.0)
Hemoglobin: 12.3 g/dL (ref 11.7–15.5)
Lymphocytes Relative: 33 %
Lymphs Abs: 2112 cells/uL (ref 850–3900)
MCH: 29.8 pg (ref 27.0–33.0)
MCHC: 32 g/dL (ref 32.0–36.0)
MCV: 93 fL (ref 80.0–100.0)
MPV: 8.9 fL (ref 7.5–12.5)
Monocytes Absolute: 384 cells/uL (ref 200–950)
Monocytes Relative: 6 %
Neutro Abs: 3520 cells/uL (ref 1500–7800)
Neutrophils Relative %: 55 %
Platelets: 387 10*3/uL (ref 140–400)
RBC: 4.13 MIL/uL (ref 3.80–5.10)
RDW: 13.8 % (ref 11.0–15.0)
WBC: 6.4 10*3/uL (ref 3.8–10.8)

## 2016-05-12 LAB — HEPATIC FUNCTION PANEL
ALT: 13 U/L (ref 6–29)
AST: 14 U/L (ref 10–35)
Albumin: 3.7 g/dL (ref 3.6–5.1)
Alkaline Phosphatase: 52 U/L (ref 33–130)
Bilirubin, Direct: 0 mg/dL (ref <=0.2)
Indirect Bilirubin: 0.2 mg/dL (ref 0.2–1.2)
Total Bilirubin: 0.2 mg/dL (ref 0.2–1.2)
Total Protein: 6.2 g/dL (ref 6.1–8.1)

## 2016-05-12 LAB — LIPID PANEL
Cholesterol: 209 mg/dL — ABNORMAL HIGH (ref 125–200)
HDL: 41 mg/dL — ABNORMAL LOW (ref >=46)
LDL Cholesterol: 94 mg/dL (ref <130)
Total CHOL/HDL Ratio: 5.1 Ratio — ABNORMAL HIGH (ref <=5.0)
Triglycerides: 369 mg/dL — ABNORMAL HIGH (ref <150)
VLDL: 74 mg/dL — ABNORMAL HIGH (ref <30)

## 2016-05-12 LAB — MAGNESIUM: Magnesium: 1.9 mg/dL (ref 1.5–2.5)

## 2016-05-12 NOTE — Patient Instructions (Signed)

## 2016-05-12 NOTE — Progress Notes (Signed)
[facility] Unk Pinto, M.D.        Uvaldo Bristle. Silverio Lay, P.A.-C       Starlyn Skeans, P.A.-C  [facility]                [address] [place], [place] [id number] Telephone [phone] Telefax [phone] ______________________________________________________________________     This very nice 65 y.o. MWF presents for 6 month follow up with Hypertension, Hyperlipidemia, T2_NIDDM w/Periph Neuropathy and Vitamin D Deficiency.      Patient is treated for labile HTN & BP has been controlled at home. Today's BP is 122/80. Patient has had no complaints of any cardiac type chest pain, palpitations, dyspnea/orthopnea/PND, dizziness, claudication, or dependent edema.     Hyperlipidemia is controlled with diet & meds. Patient denies myalgias or other med SE's. Last Lipids were at goal albeit elevated Trig's: Lab Results  Component Value Date   CHOL 209 (H) 05/12/2016   HDL 41 (L) 05/12/2016   LDLCALC 94 05/12/2016   TRIG 369 (H) 05/12/2016   CHOLHDL 5.1 (H) 05/12/2016      Also, the patient has history of T2_NIDDM with Gestational DM circa 1980's during a 2sd pregnancy and then relapsing about 2006 starting Metformin.  She also has concomitant CKD3 (GFR 54 ml/min) Patient is followed also by Dr Jannifer Franklin for a peripheral neuropathy w/paresthesias of the distal lower extremities and wears bilat ankle braces for "foot drop" and has had no symptoms of reactive hypoglycemia, diabetic polys, but does have very poor vision & visual blurring for which she is also followed by Dr [name] for "wet"macular degeneration & is receiving "shots" in her eyes. Last A1c was not at goal:  Lab Results  Component Value Date   HGBA1C 7.7 (H) 02/07/2016      Further, the patient also has history of Vitamin D Deficiency in Jan 2016 of "26" and supplements vitamin D without any suspected side-effects. Last vitamin D was  inproved: Lab  Results  Component Value Date   VD25OH 47 10/18/2015   Current Outpatient Prescriptions on File Prior to Visit  Medication Sig  . Calcium Citrate-Vitamin D (CITRACAL/VITAMIN D PO) Take 1 tablet by mouth daily.  . Cholecalciferol (VITAMIN D PO) Take 5,000 Units by mouth 2 (two) times daily. Reported on 02/07/2016  . DULoxetine (CYMBALTA) 30 MG capsule TAKE 1 CAPSULE (30 MG TOTAL) BY MOUTH DAILY.  Marland Kitchen glimepiride (AMARYL) 2 MG tablet TAKE 1/2 TO 1 TABLET BY MOUTH DAILY FOR DIABETES  . hydrochlorothiazide (HYDRODIURIL) 25 MG tablet Take 1 tablet daily for fluid retention  . Magnesium 400 MG TABS Take 1 tablet by mouth daily.  . metFORMIN (GLUCOPHAGE-XR) 500 MG 24 hr tablet TAKE 1 TO 2 TABLETS TWICE DAILY WITH FOOD AS DIRECTED FOR DIABETES   No current facility-administered medications on file prior to visit.    Allergies  Allergen Reactions  . Gabapentin Swelling    Swelling in legs and feet   PMHx:   Past Medical History:  Diagnosis Date  . Abnormality of gait 07/28/2013  . Diabetes ([place])   . Diabetic peripheral neuropathy ([place])   . Diabetic retinopathy ([place])   . Foot drop, bilateral 07/31/2014  . Peripheral edema   . Polyneuropathy in diabetes(357.2) 07/28/2013   Immunization History  Administered Date(s) Administered  . Influenza, High Dose Seasonal PF 05/12/2016  . Pneumococcal Conjugate-13 02/07/2016  .  Td 02/07/2016   Past Surgical History:  Procedure Laterality Date  . ABDOMINAL HYSTERECTOMY    . ANKLE FRACTURE SURGERY     left  . CATARACT EXTRACTION, BILATERAL    . HIP SURGERY     right  . TONSILLECTOMY     FHx:    Reviewed / unchanged  SHx:    Reviewed / unchanged  Systems Review:  Constitutional: Denies fever, chills, wt changes, headaches, insomnia, fatigue, night sweats, change in appetite. Eyes: Denies redness, blurred vision, diplopia, discharge, itchy, watery eyes.  ENT: Denies discharge, congestion, post nasal drip, epistaxis, sore throat, earache,  hearing loss, dental pain, tinnitus, vertigo, sinus pain, snoring.  CV: Denies chest pain, palpitations, irregular heartbeat, syncope, dyspnea, diaphoresis, orthopnea, PND, claudication or edema. Respiratory: denies cough, dyspnea, DOE, pleurisy, hoarseness, laryngitis, wheezing.  Gastrointestinal: Denies dysphagia, odynophagia, heartburn, reflux, water brash, abdominal pain or cramps, nausea, vomiting, bloating, diarrhea, constipation, hematemesis, melena, hematochezia  or hemorrhoids. Genitourinary: Denies dysuria, frequency, urgency, nocturia, hesitancy, discharge, hematuria or flank pain. Musculoskeletal: Denies arthralgias, myalgias, stiffness, jt. swelling, pain, limping or strain/sprain.  Skin: Denies pruritus, rash, hives, warts, acne, eczema or change in skin lesion(s). Neuro: No weakness, tremor, incoordination, spasms, paresthesia or pain. Psychiatric: Denies confusion, memory loss or sensory loss. Endo: Denies change in weight, skin or hair change.  Heme/Lymph: No excessive bleeding, bruising or enlarged lymph nodes.  Physical Exam BP 122/80   Pulse 72   Temp 97.4 F (36.3 C)   Resp 16   Ht 5\' 5"  (1.651 m)   Wt 175 lb 6.4 oz (79.6 kg)   BMI 29.19 kg/m   Appears over nourished and in no distress.  Eyes: PERRLA, EOMs, conjunctiva no swelling or erythema. Sinuses: No frontal/maxillary tenderness ENT/Mouth: EAC's clear, TM's nl w/o erythema, bulging. Nares clear w/o erythema, swelling, exudates. Oropharynx clear without erythema or exudates. Oral hygiene is good. Tongue normal, non obstructing. Hearing intact.  Neck: Supple. Thyroid nl. Car 2+/2+ without bruits, nodes or JVD. Chest: Respirations nl with BS clear & equal w/o rales, rhonchi, wheezing or stridor.  Cor: Heart sounds normal w/ regular rate and rhythm without sig. murmurs, gallops, clicks, or rubs. Peripheral pulses normal and equal  without edema.  Abdomen: Soft & bowel sounds normal. Non-tender w/o guarding,  rebound, hernias, masses, or organomegaly.  Lymphatics: Unremarkable.  Musculoskeletal: Full ROM all peripheral extremities, joint stability, 5/5 strength, and normal gait.  Skin: Warm, dry without exposed rashes, lesions or ecchymosis apparent.  Neuro: Cranial nerves intact, reflexes equal bilaterally. Sensory-motor testing grossly intact. Tendon reflexes grossly intact.  Pysch: Alert & oriented x 3.  Insight and judgement nl & appropriate. No ideations.  Assessment and Plan:  1. Essential hypertension  - Continue medication, monitor blood pressure at home. Continue DASH diet. Reminder to go to the ER if any CP, SOB, nausea, dizziness, severe HA, changes vision/speech, left arm numbness and tingling and jaw pain. - TSH  2. Mixed hyperlipidemia  - Continue diet/meds, exercise,& lifestyle modifications. Continue monitor periodic cholesterol/liver & renal functions  - Lipid panel - TSH  3. T2_NIDDM w/CKD 3, w/o use of insulin (HCC)  - at least 20 min was spent educating patient of the importance of better dietary compliance for better control in view of her multi- system diabetic complications  - Recommend a better & more compliant diet, regular exercise, with other lifestyle modifications.  - Monitor appropriate labs. - Hemoglobin A1c - Insulin, random  4. Vitamin D deficiency  - Continue supplementation. -  VITAMIN D 25 Hydroxy   5. Medication management  - CBC with Differential/Platelet - BASIC METABOLIC PANEL WITH GFR - Hepatic function panel - Magnesium  6. Need for prophylactic vaccination and inoculation against influenza  - Flu vaccine HIGH DOSE PF (Fluzone High dose)       Recommended regular exercise, BP monitoring, weight control, and discussed med and SE's. Recommended labs to assess and monitor clinical status. Further disposition pending results of labs. Over 30 minutes of exam, counseling, chart review was performed

## 2016-05-13 ENCOUNTER — Encounter: Payer: Self-pay | Admitting: Internal Medicine

## 2016-05-13 DIAGNOSIS — E113512 Type 2 diabetes mellitus with proliferative diabetic retinopathy with macular edema, left eye: Secondary | ICD-10-CM | POA: Diagnosis not present

## 2016-05-13 LAB — VITAMIN D 25 HYDROXY (VIT D DEFICIENCY, FRACTURES): Vit D, 25-Hydroxy: 83 ng/mL (ref 30–100)

## 2016-05-13 LAB — HEMOGLOBIN A1C
Hgb A1c MFr Bld: 7.5 % — ABNORMAL HIGH (ref <5.7)
Mean Plasma Glucose: 169 mg/dL

## 2016-05-13 LAB — INSULIN, RANDOM: Insulin: 18.5 u[IU]/mL (ref 2.0–19.6)

## 2016-05-13 LAB — TSH: TSH: 1.55 mIU/L

## 2016-05-22 DIAGNOSIS — H35371 Puckering of macula, right eye: Secondary | ICD-10-CM | POA: Diagnosis not present

## 2016-05-22 DIAGNOSIS — E113512 Type 2 diabetes mellitus with proliferative diabetic retinopathy with macular edema, left eye: Secondary | ICD-10-CM | POA: Diagnosis not present

## 2016-05-22 DIAGNOSIS — E113511 Type 2 diabetes mellitus with proliferative diabetic retinopathy with macular edema, right eye: Secondary | ICD-10-CM | POA: Diagnosis not present

## 2016-05-25 ENCOUNTER — Other Ambulatory Visit: Payer: Self-pay | Admitting: Internal Medicine

## 2016-06-13 ENCOUNTER — Other Ambulatory Visit: Payer: Self-pay | Admitting: Internal Medicine

## 2016-06-19 DIAGNOSIS — E113512 Type 2 diabetes mellitus with proliferative diabetic retinopathy with macular edema, left eye: Secondary | ICD-10-CM | POA: Diagnosis not present

## 2016-06-24 ENCOUNTER — Other Ambulatory Visit: Payer: Self-pay | Admitting: Internal Medicine

## 2016-06-24 DIAGNOSIS — R609 Edema, unspecified: Secondary | ICD-10-CM

## 2016-06-26 DIAGNOSIS — E113511 Type 2 diabetes mellitus with proliferative diabetic retinopathy with macular edema, right eye: Secondary | ICD-10-CM | POA: Diagnosis not present

## 2016-07-24 DIAGNOSIS — E113512 Type 2 diabetes mellitus with proliferative diabetic retinopathy with macular edema, left eye: Secondary | ICD-10-CM | POA: Diagnosis not present

## 2016-07-31 ENCOUNTER — Ambulatory Visit: Payer: Medicare Other | Admitting: Adult Health

## 2016-07-31 DIAGNOSIS — E113511 Type 2 diabetes mellitus with proliferative diabetic retinopathy with macular edema, right eye: Secondary | ICD-10-CM | POA: Diagnosis not present

## 2016-08-14 ENCOUNTER — Ambulatory Visit: Payer: Self-pay | Admitting: Physician Assistant

## 2016-08-23 ENCOUNTER — Encounter: Payer: Self-pay | Admitting: *Deleted

## 2016-09-08 DIAGNOSIS — E113511 Type 2 diabetes mellitus with proliferative diabetic retinopathy with macular edema, right eye: Secondary | ICD-10-CM | POA: Diagnosis not present

## 2016-09-11 DIAGNOSIS — H3563 Retinal hemorrhage, bilateral: Secondary | ICD-10-CM | POA: Diagnosis not present

## 2016-09-11 DIAGNOSIS — H35383 Toxic maculopathy, bilateral: Secondary | ICD-10-CM | POA: Diagnosis not present

## 2016-09-11 DIAGNOSIS — E113512 Type 2 diabetes mellitus with proliferative diabetic retinopathy with macular edema, left eye: Secondary | ICD-10-CM | POA: Diagnosis not present

## 2016-09-11 DIAGNOSIS — H359 Unspecified retinal disorder: Secondary | ICD-10-CM | POA: Diagnosis not present

## 2016-09-11 DIAGNOSIS — E113511 Type 2 diabetes mellitus with proliferative diabetic retinopathy with macular edema, right eye: Secondary | ICD-10-CM | POA: Diagnosis not present

## 2016-09-12 ENCOUNTER — Telehealth: Payer: Self-pay | Admitting: Neurology

## 2016-09-12 NOTE — Telephone Encounter (Signed)
The patient had a recent evaluation through Dr. Herbert Deaner. The patient was noted to have proliferative diabetic retinopathy with macular edema. This affects the left eye.

## 2016-09-17 ENCOUNTER — Encounter: Payer: Self-pay | Admitting: Adult Health

## 2016-09-17 ENCOUNTER — Ambulatory Visit (INDEPENDENT_AMBULATORY_CARE_PROVIDER_SITE_OTHER): Payer: Medicare Other | Admitting: Adult Health

## 2016-09-17 VITALS — BP 141/81 | HR 66 | Ht 65.0 in | Wt 173.2 lb

## 2016-09-17 DIAGNOSIS — R269 Unspecified abnormalities of gait and mobility: Secondary | ICD-10-CM | POA: Diagnosis not present

## 2016-09-17 DIAGNOSIS — E1142 Type 2 diabetes mellitus with diabetic polyneuropathy: Secondary | ICD-10-CM

## 2016-09-17 NOTE — Progress Notes (Signed)
I have read the note, and I agree with the clinical assessment and plan.  Hetvi Shawhan KEITH   

## 2016-09-17 NOTE — Progress Notes (Signed)
PATIENT: Grace [name] DOB: 1951-07-17  REASON FOR VISIT: follow up HISTORY FROM: patient  HISTORY OF PRESENT ILLNESS: Grace [name] is a 66 year old female with a history of diabetes and diabetic peripheral neuropathy. She returns today for follow-up. She reports that Cymbalta has worked well for her discomfort. She states in the past she has tried gabapentin but had an allergy to this medication. She states that the discomfort is primarily in the lower legs. She is sleeping okay. Denies any trouble with her walking or balance. She states that she continues to use AFO braces bilaterally. She uses a walker when she is outdoors. She states that her last seen hgbA1c was 7.5. She did follow with Dr. [name] who feels that his treatment for macular degeneration is not working due to Cymbalta. He  asked the patient to consider coming off of this medication. The patient returns today for an evaluation.  HISTORY 08/01/15: Grace [name] is a 66 year old right-handed white female with a history of diabetes with a diabetic peripheral neuropathy. The patient has a significant neuropathy with bilateral foot drops, she is wearing AFO braces bilaterally which have helped her significantly, she no longer has to use a cane. She does have an occasional fall, but in general her balance has been much better. The patient is on low-dose Cymbalta taking 30 mg daily, she indicates that this is helpful for her pain control. The patient is sleeping well at night. The patient has macular edema, she is followed by Dr. [name] for this. She uses a walker at night when she uses the bathroom. She returns to this office for an evaluation. She has been running hemoglobin A1c values of around 7  REVIEW OF SYSTEMS: Out of a complete 14 system review of symptoms, the patient complains only of the following symptoms, and all other reviewed systems are negative.  Light sensitivity, loss of vision, vomiting, walking difficulty,  numbness  ALLERGIES: Allergies  Allergen Reactions  . Gabapentin Swelling    Swelling in legs and feet    HOME MEDICATIONS: Outpatient Medications Prior to Visit  Medication Sig Dispense Refill  . Calcium Citrate-Vitamin D (CITRACAL/VITAMIN D PO) Take 1 tablet by mouth daily.    . Cholecalciferol (VITAMIN D PO) Take 5,000 Units by mouth 2 (two) times daily. Reported on 02/07/2016    . DULoxetine (CYMBALTA) 30 MG capsule TAKE 1 CAPSULE (30 MG TOTAL) BY MOUTH DAILY. 90 capsule 3  . glimepiride (AMARYL) 2 MG tablet TAKE 1/2 TO 1 TABLET BY MOUTH DAILY FOR DIABETES 90 tablet 1  . hydrochlorothiazide (HYDRODIURIL) 25 MG tablet TAKE 1 TABLET DAILY FOR FLUID RETENTION 90 tablet 1  . Magnesium 400 MG TABS Take 1 tablet by mouth daily.    . metFORMIN (GLUCOPHAGE-XR) 500 MG 24 hr tablet TAKE 1 TO 2 TABLETS TWICE DAILY WITH FOOD AS DIRECTED FOR DIABETES 360 tablet 1   No facility-administered medications prior to visit.     PAST MEDICAL HISTORY: Past Medical History:  Diagnosis Date  . Abnormality of gait 07/28/2013  . Diabetes ([place])   . Diabetic peripheral neuropathy ([place])   . Diabetic retinopathy ([place])   . Foot drop, bilateral 07/31/2014  . Peripheral edema   . Polyneuropathy in diabetes(357.2) 07/28/2013    PAST SURGICAL HISTORY: Past Surgical History:  Procedure Laterality Date  . ABDOMINAL HYSTERECTOMY    . ANKLE FRACTURE SURGERY     left  . CATARACT EXTRACTION, BILATERAL    . HIP SURGERY  right  . TONSILLECTOMY      FAMILY HISTORY: Family History  Problem Relation Age of Onset  . Diabetes Mother   . Diabetes Brother   . Neuropathy Neg Hx     SOCIAL HISTORY: Social History   Social History  . Marital status: Married    Spouse name: N/A  . Number of children: 2  . Years of education: college   Occupational History  . part time    Social History Main Topics  . Smoking status: Never Smoker  . Smokeless tobacco: Never Used  . Alcohol use No  . Drug use:  No  . Sexual activity: Not on file   Other Topics Concern  . Not on file   Social History Narrative   Patient is right handed.   Patient drinks about 4 cups of tea daily.      PHYSICAL EXAM  Vitals:   09/17/16 1533  BP: (!) 141/81  Pulse: 66  Weight: 173 lb 3.2 oz (78.6 kg)  Height: 5\' 5"  (1.651 m)   Body mass index is 28.82 kg/m.  Generalized: Well developed, in no acute distress   Neurological examination  Mentation: Alert oriented to time, place, history taking. Follows all commands speech and language fluent Cranial nerve II-XII: Pupils were equal round reactive to light. Extraocular movements were full, visual field were full on confrontational test. Facial sensation and strength were normal. Uvula tongue midline. Head turning and shoulder shrug  were normal and symmetric. Motor: The motor testing reveals 5 over 5 strength of all 4 extremities. Good symmetric motor tone is noted throughout. Bilateral AFO braces Sensory: Sensory testing is intact to soft touch on all 4 extremities. No evidence of extinction is noted.  Coordination: Cerebellar testing reveals good finger-nose-finger and heel-to-shin bilaterally.  Gait and station: Gait is slightly wide-based. Tandem gait not attended. Reflexes: Deep tendon reflexes are symmetric but depressed throughout.  DIAGNOSTIC DATA (LABS, IMAGING, TESTING) - I reviewed patient records, labs, notes, testing and imaging myself where available.  Lab Results  Component Value Date   WBC 6.4 05/12/2016   HGB 12.3 05/12/2016   HCT 38.4 05/12/2016   MCV 93.0 05/12/2016   PLT 387 05/12/2016      Component Value Date/Time   NA 137 05/12/2016 1725   K 4.8 05/12/2016 1725   CL 99 05/12/2016 1725   CO2 31 05/12/2016 1725   GLUCOSE 154 (H) 05/12/2016 1725   BUN 22 05/12/2016 1725   CREATININE 0.96 05/12/2016 1725   CALCIUM 9.5 05/12/2016 1725   PROT 6.2 05/12/2016 1725   ALBUMIN 3.7 05/12/2016 1725   AST 14 05/12/2016 1725   ALT  13 05/12/2016 1725   ALKPHOS 52 05/12/2016 1725   BILITOT 0.2 05/12/2016 1725   GFRNONAA 62 05/12/2016 1725   GFRAA 72 05/12/2016 1725   Lab Results  Component Value Date   CHOL 209 (H) 05/12/2016   HDL 41 (L) 05/12/2016   LDLCALC 94 05/12/2016   TRIG 369 (H) 05/12/2016   CHOLHDL 5.1 (H) 05/12/2016   Lab Results  Component Value Date   HGBA1C 7.5 (H) 05/12/2016   No results found for: VITAMINB12 Lab Results  Component Value Date   TSH 1.55 05/12/2016      ASSESSMENT AND PLAN 66 y.o. year old female  has a past medical history of Abnormality of gait (07/28/2013); Diabetes ([place]); Diabetic peripheral neuropathy ([place]); Diabetic retinopathy ([place]); Foot drop, bilateral (07/31/2014); Peripheral edema; and Polyneuropathy in diabetes(357.2) (07/28/2013). here with:  1. Polyneuropathy in diabetes  Actually to the patient that if she would like to be taken off Cymbalta we could try something like nortriptyline. I have reviewed the side effects with the patient. She will discuss this with Dr. [name]. If he is amenable to her tring nortriptyline she will call our office. For now the patient will remain on Cymbalta. Advised that if her symptoms worsen or she develops new symptoms she should let us know. She'll follow-up in 6 months or sooner if needed.     Ward Givens, MSN, NP-C 09/17/2016, 3:42 PM [facility] [address], [place], [place] [address] [phone]

## 2016-09-17 NOTE — Patient Instructions (Signed)
Continue cymbalta for now. Ask dr. [name] about Nortriptyline Nortriptyline capsules What is this medicine? NORTRIPTYLINE (nor TRIP ti leen) is used to treat depression. This medicine may be used for other purposes; ask your health care provider or pharmacist if you have questions. COMMON BRAND NAME(S): Aventyl, Pamelor What should I tell my health care provider before I take this medicine? They need to know if you have any of these conditions: -an alcohol problem -bipolar disorder or schizophrenia -difficulty passing urine, prostate trouble -glaucoma -heart disease or recent heart attack -liver disease -over active thyroid -seizures -thoughts or plans of suicide or a previous suicide attempt or family history of suicide attempt -an unusual or allergic reaction to nortriptyline, other medicines, foods, dyes, or preservatives -pregnant or trying to get pregnant -breast-feeding How should I use this medicine? Take this medicine by mouth with a glass of water. Follow the directions on the prescription label. Take your doses at regular intervals. Do not take it more often than directed. Do not stop taking this medicine suddenly except upon the advice of your doctor. Stopping this medicine too quickly may cause serious side effects or your condition may worsen. A special MedGuide will be given to you by the pharmacist with each prescription and refill. Be sure to read this information carefully each time. Talk to your pediatrician regarding the use of this medicine in children. Special care may be needed. Overdosage: If you think you have taken too much of this medicine contact a poison control center or emergency room at once. NOTE: This medicine is only for you. Do not share this medicine with others. What if I miss a dose? If you miss a dose, take it as soon as you can. If it is almost time for your next dose, take only that dose. Do not take double or extra doses. What may interact with  this medicine? Do not take this medicine with any of the following medications: -arsenic trioxide -certain medicines medicines for irregular heart beat -cisapride -halofantrine -linezolid -MAOIs like Carbex, Eldepryl, Marplan, Nardil, and Parnate -methylene blue (injected into a vein) -other medicines for mental depression -phenothiazines like perphenazine, thioridazine and chlorpromazine -pimozide -probucol -procarbazine -sparfloxacin -St. John's Wort -ziprasidone This medicine may also interact with any of the following medications: -atropine and related drugs like hyoscyamine, scopolamine, tolterodine and others -barbiturate medicines for inducing sleep or treating seizures, such as phenobarbital -cimetidine -medicines for diabetes -medicines for seizures like carbamazepine or phenytoin -reserpine -thyroid medicine This list may not describe all possible interactions. Give your health care provider a list of all the medicines, herbs, non-prescription drugs, or dietary supplements you use. Also tell them if you smoke, drink alcohol, or use illegal drugs. Some items may interact with your medicine. What should I watch for while using this medicine? Tell your doctor if your symptoms do not get better or if they get worse. Visit your doctor or health care professional for regular checks on your progress. Because it may take several weeks to see the full effects of this medicine, it is important to continue your treatment as prescribed by your doctor. Patients and their families should watch out for new or worsening thoughts of suicide or depression. Also watch out for sudden changes in feelings such as feeling anxious, agitated, panicky, irritable, hostile, aggressive, impulsive, severely restless, overly excited and hyperactive, or not being able to sleep. If this happens, especially at the beginning of treatment or after a change in dose, call your health care professional.  You may get  drowsy or dizzy. Do not drive, use machinery, or do anything that needs mental alertness until you know how this medicine affects you. Do not stand or sit up quickly, especially if you are an older patient. This reduces the risk of dizzy or fainting spells. Alcohol may interfere with the effect of this medicine. Avoid alcoholic drinks. Do not treat yourself for coughs, colds, or allergies without asking your doctor or health care professional for advice. Some ingredients can increase possible side effects. Your mouth may get dry. Chewing sugarless gum or sucking hard candy, and drinking plenty of water may help. Contact your doctor if the problem does not go away or is severe. This medicine may cause dry eyes and blurred vision. If you wear contact lenses you may feel some discomfort. Lubricating drops may help. See your eye doctor if the problem does not go away or is severe. This medicine can cause constipation. Try to have a bowel movement at least every 2 to 3 days. If you do not have a bowel movement for 3 days, call your doctor or health care professional. This medicine can make you more sensitive to the sun. Keep out of the sun. If you cannot avoid being in the sun, wear protective clothing and use sunscreen. Do not use sun lamps or tanning beds/booths. What side effects may I notice from receiving this medicine? Side effects that you should report to your doctor or health care professional as soon as possible: -allergic reactions like skin rash, itching or hives, swelling of the face, lips, or tongue -anxious -breathing problems -changes in vision -confusion -elevated mood, decreased need for sleep, racing thoughts, impulsive behavior -eye pain -fast, irregular heartbeat -feeling faint or lightheaded, falls -feeling agitated, angry, or irritable -fever with increased sweating -hallucination, loss of contact with reality -seizures -stiff muscles -suicidal thoughts or other mood  changes -tingling, pain, or numbness in the feet or hands -trouble passing urine or change in the amount of urine -trouble sleeping -unusually weak or tired -vomiting -yellowing of the eyes or skin Side effects that usually do not require medical attention (report to your doctor or health care professional if they continue or are bothersome): -change in sex drive or performance -change in appetite or weight -constipation -dizziness -dry mouth -nausea -tired -tremors -upset stomach This list may not describe all possible side effects. Call your doctor for medical advice about side effects. You may report side effects to FDA at 1-800-FDA-1088. Where should I keep my medicine? Keep out of the reach of children. Store at room temperature between 15 and 30 degrees C (59 and 86 degrees F). Keep container tightly closed. Throw away any unused medicine after the expiration date. NOTE: This sheet is a summary. It may not cover all possible information. If you have questions about this medicine, talk to your doctor, pharmacist, or health care provider.  2017 Elsevier/Gold Standard (2016-01-04 12:53:08)

## 2016-09-23 ENCOUNTER — Ambulatory Visit (INDEPENDENT_AMBULATORY_CARE_PROVIDER_SITE_OTHER): Payer: Medicare Other | Admitting: Physician Assistant

## 2016-09-23 ENCOUNTER — Other Ambulatory Visit: Payer: Self-pay | Admitting: Neurology

## 2016-09-23 ENCOUNTER — Encounter: Payer: Self-pay | Admitting: Physician Assistant

## 2016-09-23 VITALS — BP 120/70 | HR 78 | Temp 97.3°F | Resp 14 | Ht 65.0 in | Wt 178.0 lb

## 2016-09-23 DIAGNOSIS — E1122 Type 2 diabetes mellitus with diabetic chronic kidney disease: Secondary | ICD-10-CM | POA: Diagnosis not present

## 2016-09-23 DIAGNOSIS — Z79899 Other long term (current) drug therapy: Secondary | ICD-10-CM

## 2016-09-23 DIAGNOSIS — N182 Chronic kidney disease, stage 2 (mild): Secondary | ICD-10-CM | POA: Diagnosis not present

## 2016-09-23 DIAGNOSIS — I10 Essential (primary) hypertension: Secondary | ICD-10-CM | POA: Diagnosis not present

## 2016-09-23 DIAGNOSIS — E0842 Diabetes mellitus due to underlying condition with diabetic polyneuropathy: Secondary | ICD-10-CM

## 2016-09-23 DIAGNOSIS — H3530 Unspecified macular degeneration: Secondary | ICD-10-CM

## 2016-09-23 DIAGNOSIS — E782 Mixed hyperlipidemia: Secondary | ICD-10-CM

## 2016-09-23 DIAGNOSIS — E559 Vitamin D deficiency, unspecified: Secondary | ICD-10-CM

## 2016-09-23 LAB — BASIC METABOLIC PANEL WITH GFR
BUN: 28 mg/dL — ABNORMAL HIGH (ref 7–25)
CO2: 31 mmol/L (ref 20–31)
Calcium: 9.6 mg/dL (ref 8.6–10.4)
Chloride: 100 mmol/L (ref 98–110)
Creat: 0.95 mg/dL (ref 0.50–0.99)
GFR, Est African American: 72 mL/min (ref >=60)
GFR, Est Non African American: 63 mL/min (ref >=60)
Glucose, Bld: 138 mg/dL — ABNORMAL HIGH (ref 65–99)
Potassium: 4.9 mmol/L (ref 3.5–5.3)
Sodium: 139 mmol/L (ref 135–146)

## 2016-09-23 LAB — CBC WITH DIFFERENTIAL/PLATELET
Basophils Absolute: 75 cells/uL (ref 0–200)
Basophils Relative: 1 %
Eosinophils Absolute: 300 cells/uL (ref 15–500)
Eosinophils Relative: 4 %
HCT: 37.5 % (ref 35.0–45.0)
Hemoglobin: 12.1 g/dL (ref 11.7–15.5)
Lymphocytes Relative: 27 %
Lymphs Abs: 2025 cells/uL (ref 850–3900)
MCH: 29.8 pg (ref 27.0–33.0)
MCHC: 32.3 g/dL (ref 32.0–36.0)
MCV: 92.4 fL (ref 80.0–100.0)
MPV: 9 fL (ref 7.5–12.5)
Monocytes Absolute: 525 cells/uL (ref 200–950)
Monocytes Relative: 7 %
Neutro Abs: 4575 cells/uL (ref 1500–7800)
Neutrophils Relative %: 61 %
Platelets: 400 10*3/uL (ref 140–400)
RBC: 4.06 MIL/uL (ref 3.80–5.10)
RDW: 13.8 % (ref 11.0–15.0)
WBC: 7.5 10*3/uL (ref 3.8–10.8)

## 2016-09-23 LAB — MAGNESIUM: Magnesium: 1.5 mg/dL (ref 1.5–2.5)

## 2016-09-23 LAB — HEPATIC FUNCTION PANEL
ALT: 14 U/L (ref 6–29)
AST: 15 U/L (ref 10–35)
Albumin: 3.9 g/dL (ref 3.6–5.1)
Alkaline Phosphatase: 53 U/L (ref 33–130)
Bilirubin, Direct: 0 mg/dL (ref <=0.2)
Indirect Bilirubin: 0.2 mg/dL (ref 0.2–1.2)
Total Bilirubin: 0.2 mg/dL (ref 0.2–1.2)
Total Protein: 6.1 g/dL (ref 6.1–8.1)

## 2016-09-23 LAB — LIPID PANEL
Cholesterol: 194 mg/dL (ref <200)
HDL: 44 mg/dL — ABNORMAL LOW (ref >50)
Total CHOL/HDL Ratio: 4.4 Ratio (ref <5.0)
Triglycerides: 436 mg/dL — ABNORMAL HIGH (ref <150)

## 2016-09-23 LAB — HEMOGLOBIN A1C
Hgb A1c MFr Bld: 7.8 % — ABNORMAL HIGH (ref <5.7)
Mean Plasma Glucose: 177 mg/dL

## 2016-09-23 LAB — TSH: TSH: 1.14 mIU/L

## 2016-09-23 NOTE — Patient Instructions (Signed)
Diabetes is a very complicated disease...lets simplify it.  An easy way to look at it to understand the complications is if you think of the extra sugar floating in your blood stream as glass shards floating through your blood stream.    Diabetes affects your small vessels first: 1) The glass shards (sugar) scraps down the tiny blood vessels in your eyes and lead to diabetic retinopathy, the leading cause of blindness in the [place]. Diabetes is the leading cause of newly diagnosed adult (70 to 67 years of age) blindness in the [place].  2) The glass shards scratches down the tiny vessels of your legs leading to nerve damage called neuropathy and can lead to amputations of your feet. More than 60% of all non-traumatic amputations of lower limbs occur in people with diabetes.  3) Over time the small vessels in your brain are shredded and closed off, individually this does not cause any problems but over a long period of time many of the small vessels being blocked can lead to Vascular Dementia.   4) Your kidney's are a filter system and have a "net" that keeps certain things in the body and lets bad things out. Sugar shreds this net and leads to kidney damage and eventually failure. Decreasing the sugar that is destroying the net and certain blood pressure medications can help stop or decrease progression of kidney disease. Diabetes was the primary cause of kidney failure in 44 percent of all new cases in 2011.  5) Diabetes also destroys the small vessels in your penis that lead to erectile dysfunction. Eventually the vessels are so damaged that you may not be responsive to cialis or viagra.   Diabetes and your large vessels: Your larger vessels consist of your coronary arteries in your heart and the carotid vessels to your brain. Diabetes or even increased sugars put you at 300% increased risk of heart attack and stroke and this is why.. The sugar scrapes down your large blood vessels and your body  sees this as an internal injury and tries to repair itself. Just like you get a scab on your skin, your platelets will stick to the blood vessel wall trying to heal it. This is why we have diabetics on low dose aspirin daily, this prevents the platelets from sticking and can prevent plaque formation. In addition, your body takes cholesterol and tries to shove it into the open wound. This is why we want your LDL, or bad cholesterol, below 70.   The combination of platelets and cholesterol over 5-10 years forms plaque that can break off and cause a heart attack or stroke.   PLEASE REMEMBER:  Diabetes is preventable! Up to 79 percent of complications and morbidities among individuals with type 2 diabetes can be prevented, delayed, or effectively treated and minimized with regular visits to a health professional, appropriate monitoring and medication, and a healthy diet and lifestyle.   Simple math prevails.    1st - exercise does not produce significant weight loss - at best one converts fat into muscle , "bulks up", loses inches, but usually stays "weight neutral"     2nd - think of your body weightas a check book: If you eat more calories than you burn up - you save money or gain weight .... Or if you spend more money than you put in the check book, ie burn up more calories than you eat, then you lose weight     3rd - if you walk or  run 1 mile, you burn up 100 calories - you have to burn up 3,500 calories to lose 1 pound, ie you have to walk/run 35 miles to lose 1 measly pound. So if you want to lose 10 #, then you have to walk/run 350 miles, so.... clearly exercise is not the solution.     4. So if you consume 1,500 calories, then you have to burn up the equivalent of 15 miles to stay weight neutral - It also stands to reason that if you consume 1,500 cal/day and don't lose weight, then you must be burning up about 1,500 cals/day to stay weight neutral.     5. If you really want to lose weight, you  must cut your calorie intake 300 calories /day and at that rate you should lose about 1 # every 3 days.   6. Please purchase Dr Fara Olden Fuhrman's book(s) "The End of Dieting" & "Eat to Live" . It has some great concepts and recipes.       Bad carbs also include fruit juice, alcohol, and sweet tea. These are empty calories that do not signal to your brain that you are full.   Please remember the good carbs are still carbs which convert into sugar. So please measure them out no more than 1/2-1 cup of rice, oatmeal, pasta, and beans  Veggies are however free foods! Pile them on.   Not all fruit is created equal. Please see the list below, the fruit at the bottom is higher in sugars than the fruit at the top. Please avoid all dried fruits.

## 2016-09-23 NOTE — Progress Notes (Signed)
Assessment and Plan:   Hypertension -Continue medication, monitor blood pressure at home. Continue DASH diet.  Reminder to go to the ER if any CP, SOB, nausea, dizziness, severe HA, changes vision/speech, left arm numbness and tingling and jaw pain.  Cholesterol -Continue diet and exercise. Check cholesterol.    Diabetes with diabetic chronic kidney disease and with diabetic polyneuropathy -Continue diet and exercise. Check A1C - declines ACE/ARB - not on bASA due to mac degen  Vitamin D Def - check level and continue medications.    Continue diet and meds as discussed. Further disposition pending results of labs. Discussed med's effects and SE's.   Over 30 minutes of exam, counseling, chart review, and critical decision making was performed Future Appointments Date Time Provider [place]  11/27/2016 3:00 PM Unk Pinto, MD [facility] None  03/30/2017 3:00 PM Ward Givens, NP [facility] None     HPI 66 y.o. female  presents for 3 month follow up on hypertension, cholesterol, diabetes and vitamin D deficiency.   Her blood pressure has been controlled at home, today their BP is BP: 120/70  She does not workout. She denies chest pain, shortness of breath, dizziness.  She is on cholesterol medication and denies myalgias. Her cholesterol is not at goal. The cholesterol last visit was:   Lab Results  Component Value Date   CHOL 209 (H) 05/12/2016   HDL 41 (L) 05/12/2016   LDLCALC 94 05/12/2016   TRIG 369 (H) 05/12/2016   CHOLHDL 5.1 (H) 05/12/2016   She has been working on diet and exercise for Diabetes with diabetic chronic kidney disease and with diabetic polyneuropathy, she is not on bASA due to Mac Degeneration, she is not on ACE/ARB, states watched 58 year old mother with DM take tons of meds and the "meds hurt her more than the DM", and denies paresthesia of the feet, polydipsia and polyuria. Last A1C was:  Lab Results  Component Value Date   HGBA1C 7.5 (H)  05/12/2016   Lab Results  Component Value Date   GFRNONAA 62 05/12/2016   Patient is on Vitamin D supplement.   Lab Results  Component Value Date   VD25OH 83 05/12/2016     BMI is Body mass index is 29.62 kg/m., she is working on diet and exercise. Wt Readings from Last 3 Encounters:  09/23/16 178 lb (80.7 kg)  09/17/16 173 lb 3.2 oz (78.6 kg)  05/12/16 175 lb 6.4 oz (79.6 kg)    Current Medications:  Current Outpatient Prescriptions on File Prior to Visit  Medication Sig  . Calcium Citrate-Vitamin D (CITRACAL/VITAMIN D PO) Take 1 tablet by mouth daily.  . Cholecalciferol (VITAMIN D PO) Take 5,000 Units by mouth 2 (two) times daily. Reported on 02/07/2016  . glimepiride (AMARYL) 2 MG tablet TAKE 1/2 TO 1 TABLET BY MOUTH DAILY FOR DIABETES  . hydrochlorothiazide (HYDRODIURIL) 25 MG tablet TAKE 1 TABLET DAILY FOR FLUID RETENTION  . Magnesium 400 MG TABS Take 1 tablet by mouth daily.  . metFORMIN (GLUCOPHAGE-XR) 500 MG 24 hr tablet TAKE 1 TO 2 TABLETS TWICE DAILY WITH FOOD AS DIRECTED FOR DIABETES   No current facility-administered medications on file prior to visit.     Medical History:  Past Medical History:  Diagnosis Date  . Abnormality of gait 07/28/2013  . Diabetes ([place])   . Diabetic peripheral neuropathy ([place])   . Diabetic retinopathy ([place])   . Foot drop, bilateral 07/31/2014  . Peripheral edema   . Polyneuropathy in diabetes(357.2)  07/28/2013   Allergies:  Allergies  Allergen Reactions  . Gabapentin Swelling    Swelling in legs and feet     Review of Systems:  Review of Systems  Constitutional: Positive for malaise/fatigue. Negative for chills and fever.  HENT: Negative for congestion, ear pain and sore throat.   Eyes: Negative.   Respiratory: Negative for cough, shortness of breath and wheezing.   Cardiovascular: Negative for chest pain, palpitations and leg swelling.  Gastrointestinal: Positive for diarrhea, heartburn, nausea and vomiting. Negative for  blood in stool, constipation and melena.  Genitourinary: Negative.   Skin: Negative.   Neurological: Positive for sensory change. Negative for dizziness, loss of consciousness and headaches.  Psychiatric/Behavioral: Negative for depression. The patient is not nervous/anxious and does not have insomnia.     Family history- Review and unchanged Social history- Review and unchanged Physical Exam: BP 120/70   Pulse 78   Temp 97.3 F (36.3 C)   Resp 14   Ht _0  (1.651 m)   Wt 178 lb (80.7 kg)   SpO2 97%   BMI 29.62 kg/m  Wt Readings from Last 3 Encounters:  09/23/16 178 lb (80.7 kg)  09/17/16 173 lb 3.2 oz (78.6 kg)  05/12/16 175 lb 6.4 oz (79.6 kg)   General Appearance: Well nourished, in no apparent distress. Eyes: PERRLA, EOMs, conjunctiva no swelling or erythema Sinuses: No Frontal/maxillary tenderness ENT/Mouth: Ext aud canals clear, TMs without erythema, bulging. No erythema, swelling, or exudate on post pharynx.  Tonsils not swollen or erythematous. Hearing normal.  Neck: Supple, thyroid normal.  Respiratory: Respiratory effort normal, BS equal bilaterally without rales, rhonchi, wheezing or stridor.  Cardio: RRR with no MRGs. Brisk peripheral pulses without edema.  Abdomen: Soft, + BS.  Non tender, no guarding, rebound, hernias, masses. Lymphatics: Non tender without lymphadenopathy.  Musculoskeletal: Full ROM, 5/5 strength, Normal gait Skin: Warm, dry without rashes, lesions, ecchymosis.  Neuro: Cranial nerves intact. No cerebellar symptoms.  Psych: Awake and oriented X 3, normal affect, Insight and Judgment appropriate.    Vicie Mutters, PA-C 4:19 PM [facility]

## 2016-09-29 ENCOUNTER — Other Ambulatory Visit: Payer: Self-pay | Admitting: Physician Assistant

## 2016-09-29 MED ORDER — CLOBETASOL PROPIONATE 0.05 % EX SOLN: 1.0000 "application " | Freq: Two times a day (BID) | CUTANEOUS | 0 refills | Status: DC

## 2016-09-30 ENCOUNTER — Other Ambulatory Visit: Payer: Self-pay | Admitting: Physician Assistant

## 2016-09-30 MED ORDER — FLUOCINONIDE 0.05 % EX SOLN: 1.0000 "application " | Freq: Two times a day (BID) | CUTANEOUS | 0 refills | Status: DC

## 2016-10-13 DIAGNOSIS — H35371 Puckering of macula, right eye: Secondary | ICD-10-CM | POA: Diagnosis not present

## 2016-10-13 DIAGNOSIS — E113511 Type 2 diabetes mellitus with proliferative diabetic retinopathy with macular edema, right eye: Secondary | ICD-10-CM | POA: Diagnosis not present

## 2016-10-13 DIAGNOSIS — E113512 Type 2 diabetes mellitus with proliferative diabetic retinopathy with macular edema, left eye: Secondary | ICD-10-CM | POA: Diagnosis not present

## 2016-10-16 DIAGNOSIS — E113512 Type 2 diabetes mellitus with proliferative diabetic retinopathy with macular edema, left eye: Secondary | ICD-10-CM | POA: Diagnosis not present

## 2016-11-17 DIAGNOSIS — E113511 Type 2 diabetes mellitus with proliferative diabetic retinopathy with macular edema, right eye: Secondary | ICD-10-CM | POA: Diagnosis not present

## 2016-11-21 ENCOUNTER — Other Ambulatory Visit: Payer: Self-pay | Admitting: Internal Medicine

## 2016-11-24 DIAGNOSIS — E113512 Type 2 diabetes mellitus with proliferative diabetic retinopathy with macular edema, left eye: Secondary | ICD-10-CM | POA: Diagnosis not present

## 2016-11-27 ENCOUNTER — Encounter: Payer: Self-pay | Admitting: Internal Medicine

## 2016-12-14 ENCOUNTER — Other Ambulatory Visit: Payer: Self-pay | Admitting: Internal Medicine

## 2016-12-29 DIAGNOSIS — E113511 Type 2 diabetes mellitus with proliferative diabetic retinopathy with macular edema, right eye: Secondary | ICD-10-CM | POA: Diagnosis not present

## 2016-12-29 DIAGNOSIS — H35371 Puckering of macula, right eye: Secondary | ICD-10-CM | POA: Diagnosis not present

## 2016-12-29 DIAGNOSIS — E113512 Type 2 diabetes mellitus with proliferative diabetic retinopathy with macular edema, left eye: Secondary | ICD-10-CM | POA: Diagnosis not present

## 2016-12-29 DIAGNOSIS — H35383 Toxic maculopathy, bilateral: Secondary | ICD-10-CM | POA: Diagnosis not present

## 2016-12-31 ENCOUNTER — Encounter: Payer: Self-pay | Admitting: Internal Medicine

## 2017-01-01 DIAGNOSIS — E113512 Type 2 diabetes mellitus with proliferative diabetic retinopathy with macular edema, left eye: Secondary | ICD-10-CM | POA: Diagnosis not present

## 2017-01-02 ENCOUNTER — Other Ambulatory Visit: Payer: Self-pay | Admitting: Physician Assistant

## 2017-01-02 ENCOUNTER — Other Ambulatory Visit: Payer: Self-pay | Admitting: Internal Medicine

## 2017-01-26 NOTE — Progress Notes (Signed)
[place] [facility] Unk Pinto, M.D.      Uvaldo Bristle. Silverio Lay, P.A.-C [facility]                [address] [place] [address] Telephone [phone] Telefax [phone]   Comprehensive Evaluation &  Examination     This very nice 66 y.o. MWF presents for a  comprehensive evaluation and management of multiple medical co-morbidities.  Patient has been followed for HTN, T2_NIDDM  W/PN - CKD3 - Diab Retinopathy and wet AMD,  Hyperlipidemia and Vitamin D Deficiency.     Labile HTN predates several years and is monitored expectantly and currently treated with a diuretic.  Patient's BP has been controlled at home and patient denies any cardiac symptoms as chest pain, palpitations, shortness of breath, dizziness or ankle swelling. Today's BP is at goal - 124/78      Patient's hyperlipidemia is controlled with diet and medications. Patient denies myalgias or other medication SE's. Last lipids were not at goal with elevated Trig's: Lab Results  Component Value Date   CHOL 194 09/23/2016   HDL 44 (L) 09/23/2016   LDLCALC NOT CALC 09/23/2016   TRIG 436 (H) 09/23/2016   CHOLHDL 4.4 09/23/2016      Patient has T2_NIDDM predating w/gestational DM in the 1980's during a 2sd pregnancy and then representing in 2006 and started on Metformin. Patient also has CKD 2 (GFR 54). She is also followed by Dr Jannifer Franklin for painful Peripheral Neuropathy with Bilat ankle braces for bilat foot drop. Her neuropathy pain is improved on Cymbalta.  Patient is followed by Dr [name] for "Wet" MD receiving Avastin IO injections. Patient denies reactive hypoglycemic symptoms, visual blurring or diabetic polys. She reports CBG's usually range betw 90's - 130's mg%.  Last A1c was not at goal: Lab Results  Component Value Date   HGBA1C 7.8 (H) 09/23/2016      Finally, patient has history of Vitamin D Deficiency ("26" in Jan 2016) and last  Vitamin D was  Lab Results  Component Value Date   VD25OH 83 05/12/2016   Current Outpatient Prescriptions on File Prior to Visit  Medication Sig  . Calcium Citrate-Vitamin D  Take 1 tab daily.  Marland Kitchen VITAMIN D  5,000 Units  Take 2 x daily  . DULoxetine 30 MG capsule TAKE 1 CAP DAILY.  Marland Kitchen glimepiride  2 MG tablet TAKE 1/2-1 TAB DAILY  . Magnesium 400 MG TABS Take 1 tab daily.  . metFORMIN -XR 500 MG  TAKE 1-2 TAB 2 x DAILY    Allergies  Allergen Reactions  . Gabapentin Swelling    Swelling in legs and feet   Past Medical History:  Diagnosis Date  . Abnormality of gait 07/28/2013  . Diabetes ([place])   . Diabetic peripheral neuropathy ([place])   . Diabetic retinopathy ([place])   . Foot drop, bilateral 07/31/2014  . Peripheral edema   . Polyneuropathy in diabetes(357.2) 07/28/2013   Health Maintenance  Topic Date Due  . COLONOSCOPY  05/14/2000  . DEXA SCAN  05/15/2015  . FOOT EXAM  10/17/2016  . URINE MICROALBUMIN  10/17/2016  . PNA vac Low Risk Adult (2 of 2 - PPSV23) 02/06/2017  . INFLUENZA VACCINE  03/18/2017  . HEMOGLOBIN A1C  03/23/2017  . OPHTHALMOLOGY EXAM  06/19/2017  . MAMMOGRAM  04/18/2018  . TETANUS/TDAP  02/06/2026  Immunization History  Administered Date(s) Administered  . Influenza, High Dose Seasonal PF 05/12/2016  . Pneumococcal Conjugate-13 02/07/2016  . Td 02/07/2016   Past Surgical History:  Procedure Laterality Date  . ABDOMINAL HYSTERECTOMY    . ANKLE FRACTURE SURGERY     left  . CATARACT EXTRACTION, BILATERAL    . HIP SURGERY     right  . TONSILLECTOMY     Family History  Problem Relation Age of Onset  . Diabetes Mother   . Diabetes Brother   . Neuropathy Neg Hx    Social History  Substance Use Topics  . Smoking status: Never Smoker  . Smokeless tobacco: Never Used  . Alcohol use No    ROS Constitutional: Denies fever, chills, weight loss/gain, headaches, insomnia,  night sweats, and change in appetite. Does c/o fatigue. Eyes: Denies  redness, blurred vision, diplopia, discharge, itchy, watery eyes.  ENT: Denies discharge, congestion, post nasal drip, epistaxis, sore throat, earache, hearing loss, dental pain, Tinnitus, Vertigo, Sinus pain, snoring.  Cardio: Denies chest pain, palpitations, irregular heartbeat, syncope, dyspnea, diaphoresis, orthopnea, PND, claudication, edema Respiratory: denies cough, dyspnea, DOE, pleurisy, hoarseness, laryngitis, wheezing.  Gastrointestinal: Denies dysphagia, heartburn, reflux, water brash, pain, cramps, nausea, vomiting, bloating, diarrhea, constipation, hematemesis, melena, hematochezia, jaundice, hemorrhoids Genitourinary: Denies dysuria, frequency, urgency, nocturia, hesitancy, discharge, hematuria, flank pain Breast: Breast lumps, nipple discharge, bleeding.  Musculoskeletal: Denies arthralgia, myalgia, stiffness, Jt. Swelling, pain, limp, and strain/sprain. Denies falls. Skin: Denies puritis, rash, hives, warts, acne, eczema, changing in skin lesion Neuro: No weakness, tremor, incoordination, spasms, paresthesia, pain Psychiatric: Denies confusion, memory loss, sensory loss. Denies Depression. Endocrine: Denies change in weight, skin, hair change, nocturia, and paresthesia, diabetic polys, visual blurring, hyper / hypo glycemic episodes.  Heme/Lymph: No excessive bleeding, bruising, enlarged lymph nodes.  Physical Exam  BP 124/78   Pulse 70   Temp 97.3 F (36.3 C)   Resp 16   Ht 5\' 6"  (1.676 m)   Wt 176 lb 12.8 oz (80.2 kg)   BMI 28.54 kg/m   General Appearance: Well nourished, well groomed and in no apparent distress.  Eyes: PERRLA, EOMs, conjunctiva no swelling or erythema, normal fundi and vessels. Sinuses: No frontal/maxillary tenderness ENT/Mouth: EACs patent / TMs  nl. Nares clear without erythema, swelling, mucoid exudates. Oral hygiene is good. No erythema, swelling, or exudate. Tongue normal, non-obstructing. Tonsils not swollen or erythematous. Hearing normal.   Neck: Supple, thyroid normal. No bruits, nodes or JVD. Respiratory: Respiratory effort normal.  BS equal and clear bilateral without rales, rhonci, wheezing or stridor. Cardio: Heart sounds are normal with regular rate and rhythm and no murmurs, rubs or gallops. Peripheral pulses are normal and equal bilaterally without edema. No aortic or femoral bruits. Chest: symmetric with normal excursions and percussion. Breasts: Symmetric, without lumps, nipple discharge, retractions, or fibrocystic changes.  Abdomen: Flat, soft with bowel sounds active. Nontender, no guarding, rebound, hernias, masses, or organomegaly.  Lymphatics: Non tender without lymphadenopathy.  Musculoskeletal: Generalized decrease in muscle power, tone & bulk with Bilat ankle braces and sl broad-based gait.  Skin: Warm and dry without rashes, lesions, cyanosis, clubbing or  ecchymosis.  Neuro: Cranial nerves intact, reflexes equal bilaterally. Normal muscle tone, no cerebellar symptoms. Sensation decreased in a stocking distribution to touch, vibratory and Monofilament to the toes bilaterally. Pysch: Alert and oriented X 3, normal affect, Insight and Judgment appropriate.   Assessment and Plan  1. Essential hypertension  - EKG 12-Lead - Urinalysis, Routine w reflex microscopic -  Microalbumin / creatinine urine ratio - CBC with Differential/Platelet - BASIC METABOLIC PANEL WITH GFR - Magnesium - TSH  2. Hyperlipidemia, mixed  - EKG 12-Lead - Hepatic function panel - Lipid panel - TSH  3. Type 2 diabetes mellitus with stage 2 chronic kidney disease, without long-term current use of insulin (HCC)  - EKG 12-Lead - Microalbumin / creatinine urine ratio - HM DIABETES FOOT EXAM - LOW EXTREMITY NEUR EXAM DOCUM - Hemoglobin A1c - Insulin, random  4. Vitamin D deficiency  - VITAMIN D 25 Hydroxy   5. Diabetic polyneuropathy associated with diabetes mellitus due to underlying condition ([place])  - Hemoglobin A1c -  Insulin, random  6. Screening for rectal cancer  - POC Hemoccult Bld/Stl   7. Screening for ischemic heart disease  - EKG 12-Lead  8. Medication management  - Urinalysis, Routine w reflex microscopic - Microalbumin / creatinine urine ratio - CBC with Differential/Platelet - BASIC METABOLIC PANEL WITH GFR - Hepatic function panel - Magnesium - Lipid panel - TSH - Hemoglobin A1c - Insulin, random - VITAMIN D 25 Hydroxy      Patient was counseled in prudent diet to achieve/maintain BMI less than 25 for weight control, BP monitoring, regular exercise and medications. Discussed med's effects and SE's. Screening labs and tests as requested with regular follow-up as recommended. Over 40 minutes of exam, counseling, chart review and high complex critical decision making was performed.

## 2017-01-26 NOTE — Patient Instructions (Signed)

## 2017-01-27 ENCOUNTER — Ambulatory Visit (INDEPENDENT_AMBULATORY_CARE_PROVIDER_SITE_OTHER): Payer: Medicare Other | Admitting: Internal Medicine

## 2017-01-27 VITALS — BP 124/78 | HR 70 | Temp 97.3°F | Resp 16 | Ht 66.0 in | Wt 176.8 lb

## 2017-01-27 DIAGNOSIS — E782 Mixed hyperlipidemia: Secondary | ICD-10-CM

## 2017-01-27 DIAGNOSIS — E559 Vitamin D deficiency, unspecified: Secondary | ICD-10-CM

## 2017-01-27 DIAGNOSIS — E0842 Diabetes mellitus due to underlying condition with diabetic polyneuropathy: Secondary | ICD-10-CM | POA: Diagnosis not present

## 2017-01-27 DIAGNOSIS — Z1212 Encounter for screening for malignant neoplasm of rectum: Secondary | ICD-10-CM

## 2017-01-27 DIAGNOSIS — I10 Essential (primary) hypertension: Secondary | ICD-10-CM | POA: Diagnosis not present

## 2017-01-27 DIAGNOSIS — Z79899 Other long term (current) drug therapy: Secondary | ICD-10-CM

## 2017-01-27 DIAGNOSIS — Z136 Encounter for screening for cardiovascular disorders: Secondary | ICD-10-CM

## 2017-01-27 DIAGNOSIS — E1122 Type 2 diabetes mellitus with diabetic chronic kidney disease: Secondary | ICD-10-CM | POA: Diagnosis not present

## 2017-01-27 DIAGNOSIS — N182 Chronic kidney disease, stage 2 (mild): Secondary | ICD-10-CM | POA: Diagnosis not present

## 2017-01-27 DIAGNOSIS — N183 Chronic kidney disease, stage 3 (moderate): Secondary | ICD-10-CM | POA: Diagnosis not present

## 2017-01-27 LAB — TSH: TSH: 1.93 mIU/L

## 2017-01-27 LAB — CBC WITH DIFFERENTIAL/PLATELET
Basophils Absolute: 67 cells/uL (ref 0–200)
Basophils Relative: 1 %
Eosinophils Absolute: 268 cells/uL (ref 15–500)
Eosinophils Relative: 4 %
HCT: 36.8 % (ref 35.0–45.0)
Hemoglobin: 11.7 g/dL (ref 11.7–15.5)
Lymphocytes Relative: 31 %
Lymphs Abs: 2077 cells/uL (ref 850–3900)
MCH: 28.9 pg (ref 27.0–33.0)
MCHC: 31.8 g/dL — ABNORMAL LOW (ref 32.0–36.0)
MCV: 90.9 fL (ref 80.0–100.0)
MPV: 9 fL (ref 7.5–12.5)
Monocytes Absolute: 469 cells/uL (ref 200–950)
Monocytes Relative: 7 %
Neutro Abs: 3819 cells/uL (ref 1500–7800)
Neutrophils Relative %: 57 %
Platelets: 410 10*3/uL — ABNORMAL HIGH (ref 140–400)
RBC: 4.05 MIL/uL (ref 3.80–5.10)
RDW: 14.4 % (ref 11.0–15.0)
WBC: 6.7 10*3/uL (ref 3.8–10.8)

## 2017-01-28 ENCOUNTER — Other Ambulatory Visit: Payer: Self-pay | Admitting: Internal Medicine

## 2017-01-28 ENCOUNTER — Encounter: Payer: Self-pay | Admitting: Internal Medicine

## 2017-01-28 DIAGNOSIS — E782 Mixed hyperlipidemia: Secondary | ICD-10-CM

## 2017-01-28 LAB — URINALYSIS, ROUTINE W REFLEX MICROSCOPIC
Bilirubin Urine: NEGATIVE
Glucose, UA: NEGATIVE
Hgb urine dipstick: NEGATIVE
Ketones, ur: NEGATIVE
Nitrite: NEGATIVE
Protein, ur: NEGATIVE
Specific Gravity, Urine: 1.018 (ref 1.001–1.035)
pH: 7.5 (ref 5.0–8.0)

## 2017-01-28 LAB — HEMOGLOBIN A1C
Hgb A1c MFr Bld: 7.7 % — ABNORMAL HIGH (ref <5.7)
Mean Plasma Glucose: 174 mg/dL

## 2017-01-28 LAB — BASIC METABOLIC PANEL WITH GFR
BUN: 25 mg/dL (ref 7–25)
CO2: 27 mmol/L (ref 20–31)
Calcium: 9.4 mg/dL (ref 8.6–10.4)
Chloride: 99 mmol/L (ref 98–110)
Creat: 0.99 mg/dL (ref 0.50–0.99)
GFR, Est African American: 69 mL/min (ref >=60)
GFR, Est Non African American: 60 mL/min (ref >=60)
Glucose, Bld: 107 mg/dL — ABNORMAL HIGH (ref 65–99)
Potassium: 4.8 mmol/L (ref 3.5–5.3)
Sodium: 139 mmol/L (ref 135–146)

## 2017-01-28 LAB — LIPID PANEL
Cholesterol: 209 mg/dL — ABNORMAL HIGH (ref <200)
HDL: 51 mg/dL (ref >50)
LDL Cholesterol: 112 mg/dL — ABNORMAL HIGH (ref <100)
Total CHOL/HDL Ratio: 4.1 Ratio (ref <5.0)
Triglycerides: 231 mg/dL — ABNORMAL HIGH (ref <150)
VLDL: 46 mg/dL — ABNORMAL HIGH (ref <30)

## 2017-01-28 LAB — HEPATIC FUNCTION PANEL
ALT: 15 U/L (ref 6–29)
AST: 14 U/L (ref 10–35)
Albumin: 3.8 g/dL (ref 3.6–5.1)
Alkaline Phosphatase: 69 U/L (ref 33–130)
Bilirubin, Direct: 0 mg/dL (ref <=0.2)
Indirect Bilirubin: 0.3 mg/dL (ref 0.2–1.2)
Total Bilirubin: 0.3 mg/dL (ref 0.2–1.2)
Total Protein: 6.4 g/dL (ref 6.1–8.1)

## 2017-01-28 LAB — MICROALBUMIN / CREATININE URINE RATIO
Creatinine, Urine: 118 mg/dL (ref 20–320)
Microalb Creat Ratio: 8 mcg/mg creat (ref <30)
Microalb, Ur: 1 mg/dL

## 2017-01-28 LAB — INSULIN, RANDOM: Insulin: 13.2 u[IU]/mL (ref 2.0–19.6)

## 2017-01-28 LAB — VITAMIN D 25 HYDROXY (VIT D DEFICIENCY, FRACTURES): Vit D, 25-Hydroxy: 81 ng/mL (ref 30–100)

## 2017-01-28 LAB — URINALYSIS, MICROSCOPIC ONLY
Bacteria, UA: NONE SEEN [HPF]
Casts: NONE SEEN [LPF]
Crystals: NONE SEEN [HPF]
RBC / HPF: NONE SEEN RBC/HPF (ref <=2)
Yeast: NONE SEEN [HPF]

## 2017-01-28 LAB — MAGNESIUM: Magnesium: 1.9 mg/dL (ref 1.5–2.5)

## 2017-01-28 MED ORDER — ROSUVASTATIN CALCIUM 40 MG PO TABS: ORAL_TABLET | ORAL | 5 refills | Status: DC

## 2017-02-05 DIAGNOSIS — E113512 Type 2 diabetes mellitus with proliferative diabetic retinopathy with macular edema, left eye: Secondary | ICD-10-CM | POA: Diagnosis not present

## 2017-02-05 DIAGNOSIS — H35371 Puckering of macula, right eye: Secondary | ICD-10-CM | POA: Diagnosis not present

## 2017-02-05 DIAGNOSIS — H3563 Retinal hemorrhage, bilateral: Secondary | ICD-10-CM | POA: Diagnosis not present

## 2017-02-05 DIAGNOSIS — E113511 Type 2 diabetes mellitus with proliferative diabetic retinopathy with macular edema, right eye: Secondary | ICD-10-CM | POA: Diagnosis not present

## 2017-03-12 ENCOUNTER — Encounter: Payer: Self-pay | Admitting: Internal Medicine

## 2017-03-12 DIAGNOSIS — E113512 Type 2 diabetes mellitus with proliferative diabetic retinopathy with macular edema, left eye: Secondary | ICD-10-CM | POA: Diagnosis not present

## 2017-03-12 DIAGNOSIS — H35371 Puckering of macula, right eye: Secondary | ICD-10-CM | POA: Diagnosis not present

## 2017-03-12 DIAGNOSIS — H3563 Retinal hemorrhage, bilateral: Secondary | ICD-10-CM | POA: Diagnosis not present

## 2017-03-12 DIAGNOSIS — E113511 Type 2 diabetes mellitus with proliferative diabetic retinopathy with macular edema, right eye: Secondary | ICD-10-CM | POA: Diagnosis not present

## 2017-03-17 DIAGNOSIS — E113512 Type 2 diabetes mellitus with proliferative diabetic retinopathy with macular edema, left eye: Secondary | ICD-10-CM | POA: Diagnosis not present

## 2017-03-19 ENCOUNTER — Other Ambulatory Visit: Payer: Self-pay | Admitting: Internal Medicine

## 2017-03-19 MED ORDER — CEPHALEXIN 250 MG PO CAPS: ORAL_CAPSULE | ORAL | 0 refills | Status: AC

## 2017-03-30 ENCOUNTER — Ambulatory Visit: Payer: Medicare Other | Admitting: Adult Health

## 2017-04-01 ENCOUNTER — Ambulatory Visit (INDEPENDENT_AMBULATORY_CARE_PROVIDER_SITE_OTHER): Payer: Medicare Other | Admitting: Internal Medicine

## 2017-04-01 VITALS — BP 140/82 | HR 56 | Temp 97.6°F | Resp 16 | Ht 66.0 in | Wt 175.6 lb

## 2017-04-01 DIAGNOSIS — I10 Essential (primary) hypertension: Secondary | ICD-10-CM | POA: Diagnosis not present

## 2017-04-01 DIAGNOSIS — L0211 Cutaneous abscess of neck: Secondary | ICD-10-CM

## 2017-04-02 ENCOUNTER — Encounter: Payer: Self-pay | Admitting: Internal Medicine

## 2017-04-02 MED ORDER — DOXYCYCLINE HYCLATE 100 MG PO CAPS: ORAL_CAPSULE | ORAL | 0 refills | Status: DC

## 2017-04-02 NOTE — Progress Notes (Signed)
  Subjective:    Patient ID: SHENAYA LEBO, female    DOB: 1950/09/05, 66 y.o.   MRN: [id number]  HPI   This very nice 66 yo MWF wit HTN and T2_DM present wit a draining abscess for which she been on Keflex for 2 weeks w/o resolution.  Medication Sig  . Calcium Citrate-Vitamin D  Take 1 tab daily.  Marland Kitchen KEFLEX 250 MG capsule Take 1 cap 4 x / day with meals & bedtime   . VITAMIN D Take 5,000 Units  2 x daily.   . DULoxetine  30 MG  TAKE 1 CAPS DAILY.  Marland Kitchen glimepiride ( 2 MG tablet TAKE 1/2 TO 1 TAB DAILY FOR DIABETES  . Magnesium 400 MG  Take 1 tab daily.  . metFORMIN-XR 500 MG  TAKE 1 TO 2 TAB TWICE DAILY  AS DIRECTED   . rosuvastatin 40 MG tablet Take 1/2 to 1 tablet daily or as directed for Cholesterol   Allergies  Allergen Reactions  . Gabapentin Swelling    Swelling in legs and feet   Past Medical History:  Diagnosis Date  . Abnormality of gait 07/28/2013  . Diabetes ([place])   . Diabetic peripheral neuropathy ([place])   . Diabetic retinopathy ([place])   . Foot drop, bilateral 07/31/2014  . Peripheral edema   . Polyneuropathy in diabetes(357.2) 07/28/2013   Past Surgical History:  Procedure Laterality Date  . ABDOMINAL HYSTERECTOMY    . ANKLE FRACTURE SURGERY     left  . CATARACT EXTRACTION, BILATERAL    . HIP SURGERY     right  . TONSILLECTOMY     Review of Systems  10 point systems review negative except as above.    Objective:   Physical Exam  BP 140/82   Pulse (!) 56   Temp 97.6 F (36.4 C)   Resp 16   Ht 5\' 6"  (1.676 m)   Wt 175 lb 9.6 oz (79.7 kg)   BMI 28.34 kg/m   HEENT - WNL. Neck - supple.  Chest - Clear equal BS. Cor - Nl HS. RRR w/o sig MGR. PP 1(+). No edema. MS- FROM w/o deformities.  Gait Nl. Neuro -  Nl w/o focal abnormalities. Skin - there is a superficial thin walled cyst of the mid lateral Rt neck draining serous purulent drainage.   Procedure (CPT -14481)     After informed consent and aseptic prep with alcohol and anesthetized with 3 ml  of Marcaine 0.5%, then  The cyst and necrotic contents were sharply excised with a #10 scalpel and the cyst cavity was debrided of visible detritus. Then the wound cavity was collapsed and closed with several vertical mattress sutures of Proline 3-0 (#6 sutures). Abx ung was applied and covered with 2" x 3 " Tegaderm dressing. Wound care advised.     Assessment & Plan:   1. Essential hypertension   2. Abscess, neck  - Complete next 3-4 days of Keflex.  - Add Rx - doxycycline (VIBRAMYCIN) 100 MG capsule; Take 1 capsule 2 x/day with food for 5 days -  then 1 x/day with food for 10 days  Dispense: 20 capsule; Refill: 0  - ROV 6 days to recheck and partial suture removal

## 2017-04-07 ENCOUNTER — Ambulatory Visit (INDEPENDENT_AMBULATORY_CARE_PROVIDER_SITE_OTHER): Payer: Medicare Other | Admitting: Internal Medicine

## 2017-04-07 VITALS — BP 114/62 | HR 52 | Temp 97.7°F | Resp 16

## 2017-04-07 DIAGNOSIS — L0211 Cutaneous abscess of neck: Secondary | ICD-10-CM

## 2017-04-07 NOTE — Progress Notes (Signed)
     Patient returns 1 week s/p excision of an infected sebaceous cyst of the Rt neck . She has been on Abx's.     Wound appears well healed w/o signs of infection or drainage and sutures x 5 are removed w/o difficulty.

## 2017-04-14 DIAGNOSIS — E113512 Type 2 diabetes mellitus with proliferative diabetic retinopathy with macular edema, left eye: Secondary | ICD-10-CM | POA: Diagnosis not present

## 2017-04-14 DIAGNOSIS — E113511 Type 2 diabetes mellitus with proliferative diabetic retinopathy with macular edema, right eye: Secondary | ICD-10-CM | POA: Diagnosis not present

## 2017-04-19 ENCOUNTER — Other Ambulatory Visit: Payer: Self-pay | Admitting: Internal Medicine

## 2017-04-19 DIAGNOSIS — L0211 Cutaneous abscess of neck: Secondary | ICD-10-CM

## 2017-04-21 DIAGNOSIS — E113512 Type 2 diabetes mellitus with proliferative diabetic retinopathy with macular edema, left eye: Secondary | ICD-10-CM | POA: Diagnosis not present

## 2017-04-22 ENCOUNTER — Other Ambulatory Visit: Payer: Self-pay | Admitting: Internal Medicine

## 2017-04-22 DIAGNOSIS — L0211 Cutaneous abscess of neck: Secondary | ICD-10-CM

## 2017-05-13 ENCOUNTER — Ambulatory Visit: Payer: Self-pay | Admitting: Physician Assistant

## 2017-05-20 ENCOUNTER — Other Ambulatory Visit: Payer: Self-pay | Admitting: Internal Medicine

## 2017-05-21 DIAGNOSIS — E113512 Type 2 diabetes mellitus with proliferative diabetic retinopathy with macular edema, left eye: Secondary | ICD-10-CM | POA: Diagnosis not present

## 2017-05-21 DIAGNOSIS — H35371 Puckering of macula, right eye: Secondary | ICD-10-CM | POA: Diagnosis not present

## 2017-05-21 DIAGNOSIS — E113511 Type 2 diabetes mellitus with proliferative diabetic retinopathy with macular edema, right eye: Secondary | ICD-10-CM | POA: Diagnosis not present

## 2017-06-01 DIAGNOSIS — E113512 Type 2 diabetes mellitus with proliferative diabetic retinopathy with macular edema, left eye: Secondary | ICD-10-CM | POA: Diagnosis not present

## 2017-06-09 ENCOUNTER — Other Ambulatory Visit: Payer: Self-pay | Admitting: Internal Medicine

## 2017-06-24 ENCOUNTER — Encounter: Payer: Self-pay | Admitting: Adult Health

## 2017-06-24 ENCOUNTER — Ambulatory Visit (INDEPENDENT_AMBULATORY_CARE_PROVIDER_SITE_OTHER): Payer: Medicare Other | Admitting: Adult Health

## 2017-06-24 VITALS — BP 138/70 | HR 65 | Wt 179.0 lb

## 2017-06-24 DIAGNOSIS — R209 Unspecified disturbances of skin sensation: Secondary | ICD-10-CM

## 2017-06-24 DIAGNOSIS — G629 Polyneuropathy, unspecified: Secondary | ICD-10-CM

## 2017-06-24 MED ORDER — DULOXETINE HCL 30 MG PO CPEP: ORAL_CAPSULE | ORAL | 3 refills | Status: DC

## 2017-06-24 NOTE — Patient Instructions (Signed)
Your Plan:  Continue Cymbalta If your symptoms worsen or you develop new symptoms please let us know.   Thank you for coming to see [place] at [facility]. I hope we have been able to provide you high quality care today.  You may receive a patient satisfaction survey over the next few weeks. We would appreciate your feedback and comments so that we may continue to improve ourselves and the health of our patients.

## 2017-06-24 NOTE — Progress Notes (Signed)
I have read the note, and I agree with the clinical assessment and plan.  Colvin Blatt KEITH   

## 2017-06-24 NOTE — Progress Notes (Signed)
PATIENT: Grace [name] DOB: [birth year]  REASON FOR VISIT: follow up HISTORY FROM: patient  HISTORY OF PRESENT ILLNESS: Today 06/24/17 Ms. Grace [name] is a 66 year old female with a history of she states that she has remained on Cymbalta 30 mg daily.  She states that this tends to work well for her discomfort.  She states that the pain is located in the lower extremities.  She reports that it can extend up to the thighs.  She denies any trouble sleeping.  Denies any trouble with her gait or balance.  Denies any recent falls.  She states for the last month she has had a weird sensory change on the top of her head.  She states it feels as if something is oozing down her head but is very brief.  She reports no other accompanying symptoms.  She reports that there is no pattern as to when this occurs.  She returns today for an evaluation.  HISTORY 09/17/16: Ms. Grace [name] is a 66 year old female with a history of diabetes and diabetic peripheral neuropathy. She returns today for follow-up. She reports that Cymbalta has worked well for her discomfort. She states in the past she has tried gabapentin but had an allergy to this medication. She states that the discomfort is primarily in the lower legs. She is sleeping okay. Denies any trouble with her walking or balance. She states that she continues to use AFO braces bilaterally. She uses a walker when she is outdoors. She states that her last seen hgbA1c was 7.5. She did follow with Dr. [name] who feels that his treatment for macular degeneration is not working due to Cymbalta. He  asked the patient to consider coming off of this medication. The patient returns today for an evaluation.   REVIEW OF SYSTEMS: Out of a complete 14 system review of symptoms, the patient complains only of the following symptoms, and all other reviewed systems are negative.  Loss of vision, swollen abdomen, constipation, diarrhea, nausea, vomiting, walking difficulty,  incontinence of bladder, numbness    ALLERGIES: Allergies  Allergen Reactions  . Gabapentin Swelling    Swelling in legs and feet    HOME MEDICATIONS: Outpatient Medications Prior to Visit  Medication Sig Dispense Refill  . Calcium Citrate-Vitamin D (CITRACAL/VITAMIN D PO) Take 1 tablet by mouth daily.    . Cholecalciferol (VITAMIN D PO) Take 5,000 Units by mouth 2 (two) times daily. Reported on 02/07/2016    . doxycycline (VIBRAMYCIN) 100 MG capsule TAKE 1 CAPSULE 2 X/DAY WITH FOOD FOR 5 DAYS - THEN 1 X/DAY WITH FOOD FOR 10 DAYS 20 capsule 0  . doxycycline (VIBRAMYCIN) 100 MG capsule TAKE 1 CAPSULE 2 X/DAY WITH FOOD FOR 5 DAYS - THEN 1 X/DAY WITH FOOD FOR 10 DAYS 20 capsule 0  . DULoxetine (CYMBALTA) 30 MG capsule TAKE 1 CAPSULE (30 MG TOTAL) BY MOUTH DAILY. 90 capsule 3  . glimepiride (AMARYL) 2 MG tablet TAKE 1/2 TO 1 TABLET BY MOUTH DAILY FOR DIABETES 90 tablet 1  . Magnesium 400 MG TABS Take 1 tablet by mouth daily.    . metFORMIN (GLUCOPHAGE-XR) 500 MG 24 hr tablet TAKE 1 TO 2 TABLETS TWICE DAILY WITH FOOD AS DIRECTED FOR DIABETES 360 tablet 1  . rosuvastatin (CRESTOR) 40 MG tablet Take 1/2 to 1 tablet daily or as directed for Cholesterol (Patient not taking: Reported on 04/07/2017) 30 tablet 5   No facility-administered medications prior to visit.     PAST MEDICAL HISTORY: Past Medical History:  Diagnosis Date  . Abnormality of gait 07/28/2013  . Diabetes ([place])   . Diabetic peripheral neuropathy ([place])   . Diabetic retinopathy ([place])   . Foot drop, bilateral 07/31/2014  . Peripheral edema   . Polyneuropathy in diabetes(357.2) 07/28/2013    PAST SURGICAL HISTORY: Past Surgical History:  Procedure Laterality Date  . ABDOMINAL HYSTERECTOMY    . ANKLE FRACTURE SURGERY     left  . CATARACT EXTRACTION, BILATERAL    . HIP SURGERY     right  . TONSILLECTOMY      FAMILY HISTORY: Family History  Problem Relation Age of Onset  . Diabetes Mother   . Diabetes Brother   .  Neuropathy Neg Hx     SOCIAL HISTORY: Social History   Socioeconomic History  . Marital status: Married    Spouse name: Not on file  . Number of children: 2  . Years of education: college  . Highest education level: Not on file  Social Needs  . Financial resource strain: Not on file  . Food insecurity - worry: Not on file  . Food insecurity - inability: Not on file  . Transportation needs - medical: Not on file  . Transportation needs - non-medical: Not on file  Occupational History  . Occupation: part time  Tobacco Use  . Smoking status: Never Smoker  . Smokeless tobacco: Never Used  Substance and Sexual Activity  . Alcohol use: No  . Drug use: No  . Sexual activity: Not on file  Other Topics Concern  . Not on file  Social History Narrative   Patient is right handed.   Patient drinks about 4 cups of tea daily.      PHYSICAL EXAM  Vitals:   06/24/17 1437  BP: 138/70  Pulse: 65  Weight: 179 lb (81.2 kg)   Body mass index is 28.89 kg/m.  Generalized: Well developed, in no acute distress   Neurological examination  Mentation: Alert oriented to time, place, history taking. Follows all commands speech and language fluent Cranial nerve II-XII: Pupils were equal round reactive to light. Extraocular movements were full, visual field were full on confrontational test. Facial sensation and strength were normal. Uvula tongue midline. Head turning and shoulder shrug  were normal and symmetric. Motor: The motor testing reveals 5 over 5 strength of all 4 extremities. Good symmetric motor tone is noted throughout.  Sensory: Sensory testing is intact to soft touch on all 4 extremities. No evidence of extinction is noted.  Coordination: Cerebellar testing reveals good finger-nose-finger and heel-to-shin bilaterally.  Gait and station: Gait is normal. Tandem gait is normal. Romberg is negative. No drift is seen.  Reflexes: Deep tendon reflexes are symmetric and normal  bilaterally.   DIAGNOSTIC DATA (LABS, IMAGING, TESTING) - I reviewed patient records, labs, notes, testing and imaging myself where available.  Lab Results  Component Value Date   WBC 6.7 01/27/2017   HGB 11.7 01/27/2017   HCT 36.8 01/27/2017   MCV 90.9 01/27/2017   PLT 410 (H) 01/27/2017      Component Value Date/Time   NA 139 01/27/2017 1609   K 4.8 01/27/2017 1609   CL 99 01/27/2017 1609   CO2 27 01/27/2017 1609   GLUCOSE 107 (H) 01/27/2017 1609   BUN 25 01/27/2017 1609   CREATININE 0.99 01/27/2017 1609   CALCIUM 9.4 01/27/2017 1609   PROT 6.4 01/27/2017 1609   ALBUMIN 3.8 01/27/2017 1609   AST 14 01/27/2017 1609   ALT 15 01/27/2017  9407   WKGSUPJ 03 01/27/2017 1609   BILITOT 0.3 01/27/2017 1609   GFRNONAA 60 01/27/2017 1609   GFRAA 69 01/27/2017 1609   Lab Results  Component Value Date   CHOL 209 (H) 01/27/2017   HDL 51 01/27/2017   LDLCALC 112 (H) 01/27/2017   TRIG 231 (H) 01/27/2017   CHOLHDL 4.1 01/27/2017   Lab Results  Component Value Date   HGBA1C 7.7 (H) 01/27/2017   No results found for: VITAMINB12 Lab Results  Component Value Date   TSH 1.93 01/27/2017      ASSESSMENT AND PLAN 66 y.o. year old female  has a past medical history of Abnormality of gait (07/28/2013), Diabetes ([place]), Diabetic peripheral neuropathy ([place]), Diabetic retinopathy ([place]), Foot drop, bilateral (07/31/2014), Peripheral edema, and Polyneuropathy in diabetes(357.2) (07/28/2013). here with:  1.  Polyneuropathy 2.  Sensory changes  The patient will continue on Cymbalta 30 mg daily.  She is reporting a new sensory change on the top of the head.  For now we will monitor this.  The patient did not want to proceed with any further workup.  She is advised that if her symptoms worsen or she develops new symptoms she should let us know.  She will follow-up in 6 months or sooner if needed.     Ward Givens, MSN, NP-C 06/24/2017, 2:34 PM [facility] [address], [place], [place] [address] [phone]

## 2017-07-03 DIAGNOSIS — Z23 Encounter for immunization: Secondary | ICD-10-CM | POA: Diagnosis not present

## 2017-07-29 DIAGNOSIS — H3563 Retinal hemorrhage, bilateral: Secondary | ICD-10-CM | POA: Diagnosis not present

## 2017-07-29 DIAGNOSIS — E113512 Type 2 diabetes mellitus with proliferative diabetic retinopathy with macular edema, left eye: Secondary | ICD-10-CM | POA: Diagnosis not present

## 2017-07-29 DIAGNOSIS — H35371 Puckering of macula, right eye: Secondary | ICD-10-CM | POA: Diagnosis not present

## 2017-07-29 DIAGNOSIS — E113511 Type 2 diabetes mellitus with proliferative diabetic retinopathy with macular edema, right eye: Secondary | ICD-10-CM | POA: Diagnosis not present

## 2017-08-06 DIAGNOSIS — E113511 Type 2 diabetes mellitus with proliferative diabetic retinopathy with macular edema, right eye: Secondary | ICD-10-CM | POA: Diagnosis not present

## 2017-08-06 DIAGNOSIS — H35371 Puckering of macula, right eye: Secondary | ICD-10-CM | POA: Diagnosis not present

## 2017-08-13 ENCOUNTER — Ambulatory Visit: Payer: Self-pay | Admitting: Internal Medicine

## 2017-09-03 ENCOUNTER — Ambulatory Visit (INDEPENDENT_AMBULATORY_CARE_PROVIDER_SITE_OTHER): Payer: Medicare Other | Admitting: Internal Medicine

## 2017-09-03 VITALS — BP 124/76 | HR 72 | Temp 97.4°F | Resp 16 | Ht 66.0 in | Wt 178.0 lb

## 2017-09-03 DIAGNOSIS — E559 Vitamin D deficiency, unspecified: Secondary | ICD-10-CM

## 2017-09-03 DIAGNOSIS — Z23 Encounter for immunization: Secondary | ICD-10-CM | POA: Diagnosis not present

## 2017-09-03 DIAGNOSIS — N182 Chronic kidney disease, stage 2 (mild): Secondary | ICD-10-CM | POA: Diagnosis not present

## 2017-09-03 DIAGNOSIS — Z79899 Other long term (current) drug therapy: Secondary | ICD-10-CM

## 2017-09-03 DIAGNOSIS — I10 Essential (primary) hypertension: Secondary | ICD-10-CM

## 2017-09-03 DIAGNOSIS — E782 Mixed hyperlipidemia: Secondary | ICD-10-CM

## 2017-09-03 DIAGNOSIS — E1122 Type 2 diabetes mellitus with diabetic chronic kidney disease: Secondary | ICD-10-CM

## 2017-09-03 DIAGNOSIS — E0842 Diabetes mellitus due to underlying condition with diabetic polyneuropathy: Secondary | ICD-10-CM

## 2017-09-03 NOTE — Patient Instructions (Signed)

## 2017-09-03 NOTE — Progress Notes (Signed)
This very nice 67 y.o. MWF presents for 6 month follow up with Hypertension, Hyperlipidemia, T2_DM w/ Rosalee Kaufman, Wet AMD/ proliferative retinopathy/ PN and Vitamin D Deficiency.      Patient is treated for HTN with diuretics& BP has been controlled at home. Today's BP is at goal - 124/76. Patient has had no complaints of any cardiac type chest pain, palpitations, dyspnea / orthopnea / PND, dizziness, claudication, or dependent edema.     Hyperlipidemia is controlled with diet & meds. Patient denies myalgias or other med SE's. Last Lipids were not at goal and she did not start Recommended /prescribed Crestor: Lab Results  Component Value Date   CHOL 206 (H) 09/03/2017   HDL 59 09/03/2017   LDLCALC 112 (H) 01/27/2017   TRIG 269 (H) 09/03/2017   CHOLHDL 3.5 09/03/2017      Also, the patient has history of T2_NIDDM (1980's w/Gest DM) & then started Metformin in 2006. Patient has proliferative Retinopathy , wet AMD, PN & CKD2. and has had no symptoms of reactive hypoglycemia, diabetic polys, paresthesias or visual blurring.  Last A1c was not at goal: Lab Results  Component Value Date   HGBA1C 8.7 (H) 09/03/2017      Further, the patient also has history of Vitamin D Deficiency and supplements vitamin D without any suspected side-effects. Last vitamin D was at goal:  Lab Results  Component Value Date   VD25OH 61 09/03/2017   Current Outpatient Medications on File Prior to Visit  Medication Sig  .  CITRACAL/VIT D Take 1 tablet  daily.  Marland Kitchen VITAMIN D Take 5,000 Units 2 ) times daily. Reported on 02/07/2016  . DULoxetine  30 MG capsule TAKE 1 CAP DAILY.  Marland Kitchen glimepiride  2 MG tablet TAKE 1/2 TO 1 TAB DAILY FOR DIABETES  . Magnesium 400 MG TABS Take 1 tab daily.  . metFORMIN-XR 500 MG  TAKE 1 TO 2 TAB TWICE DAILY   . CRESTOR 40 MG tablet Take 1/2 to 1 tab daily (Patient not taking: Reported on 04/07/2017)   Allergies  Allergen Reactions  . Gabapentin Swelling    Swelling in legs and feet    PMHx:   Past Medical History:  Diagnosis Date  . Abnormality of gait 07/28/2013  . Diabetes ([place])   . Diabetic peripheral neuropathy ([place])   . Diabetic retinopathy ([place])   . Foot drop, bilateral 07/31/2014  . Peripheral edema   . Polyneuropathy in diabetes(357.2) 07/28/2013   Immunization History  Administered Date(s) Administered  . Influenza, High Dose Seasonal PF 05/12/2016  . Influenza-Unspecified 07/03/2017  . Pneumococcal Conjugate-13 02/07/2016  . Pneumococcal Polysaccharide-23 09/03/2017  . Td 02/07/2016   Past Surgical History:  Procedure Laterality Date  . ABDOMINAL HYSTERECTOMY    . ANKLE FRACTURE SURGERY     left  . CATARACT EXTRACTION, BILATERAL    . HIP SURGERY     right  . TONSILLECTOMY     FHx:    Reviewed / unchanged  SHx:    Reviewed / unchanged  Systems Review:  Constitutional: Denies fever, chills, wt changes, headaches, insomnia, fatigue, night sweats, change in appetite. Eyes: Denies redness, blurred vision, diplopia, discharge, itchy, watery eyes.  ENT: Denies discharge, congestion, post nasal drip, epistaxis, sore throat, earache, hearing loss, dental pain, tinnitus, vertigo, sinus pain, snoring.  CV: Denies chest pain, palpitations, irregular heartbeat, syncope, dyspnea, diaphoresis, orthopnea, PND, claudication or edema. Respiratory: denies cough, dyspnea, DOE, pleurisy, hoarseness, laryngitis, wheezing.  Gastrointestinal: Denies dysphagia,  odynophagia, heartburn, reflux, water brash, abdominal pain or cramps, nausea, vomiting, bloating, diarrhea, constipation, hematemesis, melena, hematochezia  or hemorrhoids. Genitourinary: Denies dysuria, frequency, urgency, nocturia, hesitancy, discharge, hematuria or flank pain. Musculoskeletal: Denies arthralgias, myalgias, stiffness, jt. swelling, pain, limping or strain/sprain.  Skin: Denies pruritus, rash, hives, warts, acne, eczema or change in skin lesion(s). Neuro: No weakness, tremor,  incoordination, spasms, paresthesia or pain. Psychiatric: Denies confusion, memory loss or sensory loss. Endo: Denies change in weight, skin or hair change.  Heme/Lymph: No excessive bleeding, bruising or enlarged lymph nodes.  Physical Exam  BP 124/76   Pulse 72   Temp (!) 97.4 F (36.3 C)   Resp 16   Ht 5\' 6"  (1.676 m)   Wt 178 lb (80.7 kg)   BMI 28.73 kg/m   Appears over nourished, well groomed  and in no distress.  Eyes: PERRLA, EOMs, conjunctiva no swelling or erythema. Sinuses: No frontal/maxillary tenderness ENT/Mouth: EAC's clear, TM's nl w/o erythema, bulging. Nares clear w/o erythema, swelling, exudates. Oropharynx clear without erythema or exudates. Oral hygiene is good. Tongue normal, non obstructing. Hearing intact.  Neck: Supple. Thyroid nl. Car 2+/2+ without bruits, nodes or JVD. Chest: Respirations nl with BS clear & equal w/o rales, rhonchi, wheezing or stridor.  Cor: Heart sounds normal w/ regular rate and rhythm without sig. murmurs, gallops, clicks or rubs. Peripheral pulses normal and equal  without edema.  Abdomen: Soft & bowel sounds normal. Non-tender w/o guarding, rebound, hernias, masses or organomegaly.  Lymphatics: Unremarkable.  Musculoskeletal: Full ROM all peripheral extremities, joint stability, 5/5 strength and normal gait.  Skin: Warm, dry without exposed rashes, lesions or ecchymosis apparent.  Neuro: Cranial nerves intact. Sensory testing decreased in a stocking distribution. Tendon reflexes flat to absent Pysch: Alert & oriented x 3.  Insight and judgement nl & appropriate. No ideations.  Assessment and Plan:  1. Essential hypertension  - Continue medication, monitor blood pressure at home.  - Continue DASH diet. Reminder to go to the ER if any CP,  SOB, nausea, dizziness, severe HA, changes vision/speech.  - CBC with Differential/Platelet - BASIC METABOLIC PANEL WITH GFR - Magnesium - TSH  2. Hyperlipidemia, mixed  - Continue  diet/meds, exercise,& lifestyle modifications.  - Continue monitor periodic cholesterol/liver & renal functions   - Hepatic function panel - Lipid panel - TSH  3. Type 2 diabetes mellitus with stage 2 chronic kidney disease, without long-term current use of insulin (HCC)  - Hemoglobin A1c - Insulin, random  4. Vitamin D deficiency  - Continue diet, exercise, lifestyle modifications.  - Monitor appropriate labs. - Continue supplementation. - VITAMIN D 25 Hydroxy  5. Diabetic polyneuropathy associated with diabetes mellitus due to underlying condition ([place])  - Hemoglobin A1c - Insulin, random  6. Medication management  - CBC with Differential/Platelet - BASIC METABOLIC PANEL WITH GFR - Hepatic function panel - Magnesium - Lipid panel - TSH - Hemoglobin A1c - Insulin, random - VITAMIN D 25 Hydroxy   7. Need for prophylactic vaccination against Streptococcus pneumoniae (pneumococcus)  - Pneumococcal polysaccharide vaccine 23-valent greater than or equal to 2yo subcutaneous/IM       Discussed  regular exercise, BP monitoring, weight control to achieve/maintain BMI less than 25 and discussed med and SE's. Recommended labs to assess and monitor clinical status with further disposition pending results of labs. Over 30 minutes of exam, counseling, chart review was performed.

## 2017-09-04 LAB — VITAMIN D 25 HYDROXY (VIT D DEFICIENCY, FRACTURES): Vit D, 25-Hydroxy: 61 ng/mL (ref 30–100)

## 2017-09-04 LAB — CBC WITH DIFFERENTIAL/PLATELET
Basophils Absolute: 59 cells/uL (ref 0–200)
Basophils Relative: 0.9 %
Eosinophils Absolute: 211 cells/uL (ref 15–500)
Eosinophils Relative: 3.2 %
HCT: 34.1 % — ABNORMAL LOW (ref 35.0–45.0)
Hemoglobin: 11.3 g/dL — ABNORMAL LOW (ref 11.7–15.5)
Lymphs Abs: 1993 cells/uL (ref 850–3900)
MCH: 29.1 pg (ref 27.0–33.0)
MCHC: 33.1 g/dL (ref 32.0–36.0)
MCV: 87.9 fL (ref 80.0–100.0)
MPV: 9.3 fL (ref 7.5–12.5)
Monocytes Relative: 7.3 %
Neutro Abs: 3854 cells/uL (ref 1500–7800)
Neutrophils Relative %: 58.4 %
Platelets: 452 10*3/uL — ABNORMAL HIGH (ref 140–400)
RBC: 3.88 10*6/uL (ref 3.80–5.10)
RDW: 13.2 % (ref 11.0–15.0)
Total Lymphocyte: 30.2 %
WBC mixed population: 482 cells/uL (ref 200–950)
WBC: 6.6 10*3/uL (ref 3.8–10.8)

## 2017-09-04 LAB — BASIC METABOLIC PANEL WITH GFR
BUN: 20 mg/dL (ref 7–25)
CO2: 33 mmol/L — ABNORMAL HIGH (ref 20–32)
Calcium: 10.2 mg/dL (ref 8.6–10.4)
Chloride: 101 mmol/L (ref 98–110)
Creat: 0.97 mg/dL (ref 0.50–0.99)
GFR, Est African American: 70 mL/min/{1.73_m2} (ref >=60)
GFR, Est Non African American: 60 mL/min/{1.73_m2} (ref >=60)
Glucose, Bld: 160 mg/dL — ABNORMAL HIGH (ref 65–99)
Potassium: 4.9 mmol/L (ref 3.5–5.3)
Sodium: 140 mmol/L (ref 135–146)

## 2017-09-04 LAB — HEPATIC FUNCTION PANEL
AG Ratio: 1.7 (calc) (ref 1.0–2.5)
ALT: 15 U/L (ref 6–29)
AST: 14 U/L (ref 10–35)
Albumin: 4 g/dL (ref 3.6–5.1)
Alkaline phosphatase (APISO): 68 U/L (ref 33–130)
Bilirubin, Direct: 0 mg/dL (ref 0.0–0.2)
Globulin: 2.3 g/dL (calc) (ref 1.9–3.7)
Indirect Bilirubin: 0.2 mg/dL (calc) (ref 0.2–1.2)
Total Bilirubin: 0.2 mg/dL (ref 0.2–1.2)
Total Protein: 6.3 g/dL (ref 6.1–8.1)

## 2017-09-04 LAB — HEMOGLOBIN A1C
Hgb A1c MFr Bld: 8.7 % of total Hgb — ABNORMAL HIGH (ref <5.7)
Mean Plasma Glucose: 203 (calc)
eAG (mmol/L): 11.2 (calc)

## 2017-09-04 LAB — LIPID PANEL
Cholesterol: 206 mg/dL — ABNORMAL HIGH (ref <200)
HDL: 59 mg/dL (ref >50)
LDL Cholesterol (Calc): 109 mg/dL (calc) — ABNORMAL HIGH
Non-HDL Cholesterol (Calc): 147 mg/dL (calc) — ABNORMAL HIGH (ref <130)
Total CHOL/HDL Ratio: 3.5 (calc) (ref <5.0)
Triglycerides: 269 mg/dL — ABNORMAL HIGH (ref <150)

## 2017-09-04 LAB — TSH: TSH: 1.8 mIU/L (ref 0.40–4.50)

## 2017-09-04 LAB — INSULIN, RANDOM: Insulin: 24.8 u[IU]/mL — ABNORMAL HIGH (ref 2.0–19.6)

## 2017-09-04 LAB — MAGNESIUM: Magnesium: 1.8 mg/dL (ref 1.5–2.5)

## 2017-09-06 ENCOUNTER — Encounter: Payer: Self-pay | Admitting: Internal Medicine

## 2017-09-08 DIAGNOSIS — H35371 Puckering of macula, right eye: Secondary | ICD-10-CM | POA: Diagnosis not present

## 2017-09-08 DIAGNOSIS — H35372 Puckering of macula, left eye: Secondary | ICD-10-CM | POA: Diagnosis not present

## 2017-09-08 DIAGNOSIS — E113512 Type 2 diabetes mellitus with proliferative diabetic retinopathy with macular edema, left eye: Secondary | ICD-10-CM | POA: Diagnosis not present

## 2017-09-08 DIAGNOSIS — E113511 Type 2 diabetes mellitus with proliferative diabetic retinopathy with macular edema, right eye: Secondary | ICD-10-CM | POA: Diagnosis not present

## 2017-09-10 DIAGNOSIS — E113511 Type 2 diabetes mellitus with proliferative diabetic retinopathy with macular edema, right eye: Secondary | ICD-10-CM | POA: Diagnosis not present

## 2017-09-10 DIAGNOSIS — E113512 Type 2 diabetes mellitus with proliferative diabetic retinopathy with macular edema, left eye: Secondary | ICD-10-CM | POA: Diagnosis not present

## 2017-09-14 DIAGNOSIS — E113512 Type 2 diabetes mellitus with proliferative diabetic retinopathy with macular edema, left eye: Secondary | ICD-10-CM | POA: Diagnosis not present

## 2017-09-23 ENCOUNTER — Encounter: Payer: Self-pay | Admitting: Internal Medicine

## 2017-09-23 ENCOUNTER — Ambulatory Visit (INDEPENDENT_AMBULATORY_CARE_PROVIDER_SITE_OTHER): Payer: Medicare Other | Admitting: Internal Medicine

## 2017-09-23 VITALS — BP 140/86 | HR 64 | Temp 97.3°F | Resp 16 | Ht 66.0 in | Wt 176.0 lb

## 2017-09-23 DIAGNOSIS — L919 Hypertrophic disorder of the skin, unspecified: Secondary | ICD-10-CM | POA: Diagnosis not present

## 2017-09-23 DIAGNOSIS — I10 Essential (primary) hypertension: Secondary | ICD-10-CM | POA: Diagnosis not present

## 2017-09-23 DIAGNOSIS — D485 Neoplasm of uncertain behavior of skin: Secondary | ICD-10-CM | POA: Diagnosis not present

## 2017-09-23 DIAGNOSIS — L723 Sebaceous cyst: Secondary | ICD-10-CM

## 2017-09-23 NOTE — Progress Notes (Signed)
Subjective:    Patient ID: Grace [name], female    DOB: [birth year], 67 y.o.   MRN: [id number]  HPI  This very nice 67 yo MWF with HTN, T2_DM / CKD3 / PN presents for recheck reporting home BP's have been normal. She also has concern re: an enlarging sebaceous cyst in an area of prior cyst excision  of her Rt neck. In addition she's concerned with a raised pruritic lesion of the Left Cheek.    Medication Sig  . Calcium Citrate-Vitamin D (CITRACAL/VITAMIN D PO) Take 1 tablet by mouth daily.  . Cholecalciferol (VITAMIN D PO) Take 5,000 Units by mouth 2 (two) times daily. Reported on 02/07/2016  . DULoxetine (CYMBALTA) 30 MG capsule TAKE 1 CAPSULE (30 MG TOTAL) BY MOUTH DAILY.  Marland Kitchen glimepiride (AMARYL) 2 MG tablet TAKE 1/2 TO 1 TABLET BY MOUTH DAILY FOR DIABETES  . Magnesium 400 MG TABS Take 1 tablet by mouth daily.  . metFORMIN (GLUCOPHAGE-XR) 500 MG 24 hr tablet TAKE 1 TO 2 TABLETS TWICE DAILY WITH FOOD AS DIRECTED FOR DIABETES  . rosuvastatin (CRESTOR) 40 MG tablet Take 1/2 to 1 tablet daily or as directed for Cholesterol (Patient not taking: Reported on 04/07/2017)   No facility-administered medications prior to visit.    Allergies  Allergen Reactions  . Gabapentin Swelling    Swelling in legs and feet   Past Medical History:  Diagnosis Date  . Abnormality of gait 07/28/2013  . Diabetes ([place])   . Diabetic peripheral neuropathy ([place])   . Diabetic retinopathy ([place])   . Foot drop, bilateral 07/31/2014  . Peripheral edema   . Polyneuropathy in diabetes(357.2) 07/28/2013   Past Surgical History:  Procedure Laterality Date  . ABDOMINAL HYSTERECTOMY    . ANKLE FRACTURE SURGERY     left  . CATARACT EXTRACTION, BILATERAL    . HIP SURGERY     right  . TONSILLECTOMY     Review of Systems  10 point systems review negative except as above.    Objective:   Physical Exam  BP 140/86   Pulse 64   Temp (!) 97.3 F (36.3 C)   Resp 16   Ht 5\' 6"  (1.676 m)   Wt 176 lb (79.8 kg)    BMI 28.41 kg/m   HEENT - WNL. Neck - supple.  Chest - Clear. Cor - Nl HS. RRR w/o sig m MS- FROM w/o deformities.  Gait broad-based and limping. Neuro -  Nl w/o focal abnormalities. Skin - (1) There is a 5 mm sebaceous cyst in a scarred area of the lower Rt lateral neck proximity of prior seb cyst excision. (2) Also, there is a 4 x 5 mm pink raised sl irregular lesion of the L cheek - malar area.    (1) Procedure (CPT - 11310) After informed consent and aseptic prep w/alcohol, the area was anesthetized with 2.5 ml of Marcaine 0.5% an the sebaceous cyst was excised sharply full thickness with a #10 scalpel and then the wound edges were approximated with # 5 interrupted sutures of Nylon 4-0.    (2) Procedure (CPT - 11420)  Then after similar prep & anesthesia with 0.5 ml Marcaine 0.5%, the exophytic lesion was sharply excised by shave technique with a #10 scalpel and sent for path analysis. The wound base was lightly hyfrecated for hemostasis.  Sterile antibiotic ung and dressings were applied to each and patient was instructed in wound care. Advised return in 11-12 days for suture removal to the  Rt neck or call sooner if problems.     Assessment & Plan:   1. Essential hypertension  - high normal at goal  2. Sebaceous cyst  - excised   3. Neoplasm of uncertain behavior of skin - Left cheek    - excised & sent to  - Dermatology pathology

## 2017-10-05 ENCOUNTER — Ambulatory Visit: Payer: Medicare Other | Admitting: Internal Medicine

## 2017-10-05 VITALS — BP 132/80 | HR 76 | Temp 97.0°F | Resp 16 | Ht 66.0 in | Wt 178.6 lb

## 2017-10-05 DIAGNOSIS — L723 Sebaceous cyst: Secondary | ICD-10-CM

## 2017-10-05 NOTE — Progress Notes (Signed)
      Suture removal from wound Rt neck s/p sebaceous cyst excision. Incision clean, well-healed w/o signs of infection. All sutures removed .

## 2017-10-12 DIAGNOSIS — E113511 Type 2 diabetes mellitus with proliferative diabetic retinopathy with macular edema, right eye: Secondary | ICD-10-CM | POA: Diagnosis not present

## 2017-10-26 DIAGNOSIS — H35372 Puckering of macula, left eye: Secondary | ICD-10-CM | POA: Diagnosis not present

## 2017-10-26 DIAGNOSIS — E113512 Type 2 diabetes mellitus with proliferative diabetic retinopathy with macular edema, left eye: Secondary | ICD-10-CM | POA: Diagnosis not present

## 2017-10-27 DIAGNOSIS — E113511 Type 2 diabetes mellitus with proliferative diabetic retinopathy with macular edema, right eye: Secondary | ICD-10-CM | POA: Diagnosis not present

## 2017-10-27 DIAGNOSIS — H35371 Puckering of macula, right eye: Secondary | ICD-10-CM | POA: Diagnosis not present

## 2017-11-16 ENCOUNTER — Other Ambulatory Visit: Payer: Self-pay | Admitting: Internal Medicine

## 2017-12-07 NOTE — Progress Notes (Deleted)
WELCOME TO MEDICARE ANNUAL WELLNESS VISIT AND FOLLOW UP  Assessment:     Over 30 minutes of exam, counseling, chart review, and critical decision making was performed  Future Appointments  Date Time Provider [place]  12/09/2017  4:15 PM Vicie Mutters, PA-C [facility] None  03/11/2018  3:45 PM Unk Pinto, MD [facility] None  06/24/2018  3:30 PM Kathrynn Ducking, MD [facility] None    Plan:   During the course of the visit the patient was educated and counseled about appropriate screening and preventive services including:    Pneumococcal vaccine   Influenza vaccine  Td vaccine  Prevnar 13  Screening electrocardiogram  Screening mammography  Bone densitometry screening  Colorectal cancer screening  Diabetes screening  Glaucoma screening  Nutrition counseling   Advanced directives: given info/requested copies   Subjective:   Grace [name] is a 67 y.o. female who presents for Medicare Annual Wellness Visit and 3 month follow up on hypertension, diabetes, hyperlipidemia, vitamin D def.   Her blood pressure has been controlled at home, today their BP is   She does not workout. She denies chest pain, shortness of breath, dizziness.  She is not on cholesterol medication and denies myalgias. Her cholesterol is at goal. The cholesterol last visit was:   Lab Results  Component Value Date   CHOL 206 (H) 09/03/2017   HDL 59 09/03/2017   LDLCALC 109 (H) 09/03/2017   TRIG 269 (H) 09/03/2017   CHOLHDL 3.5 09/03/2017   She does have a history of diabetes managed with medication with CKD and neuropathy, diet, and exercise.  She is not at goal.  She does report worsening of blood sugars.  She also has neuropathy which is extensive and requires her to use a walker to avoid falls. She does fall frequently due to neuropathy and bilateral foot drop.     Lab Results  Component Value Date   HGBA1C 8.7 (H) 09/03/2017  She notes that her blood sugars have been  running a little bit higher.  Blood sugars are mostly under 120.  Only outlier 140 and 125.  She has not had any readings less than 70.    Last GFR Lab Results  Component Value Date   GFRNONAA 60 09/03/2017   Patient is on Vitamin D supplement. Lab Results  Component Value Date   VD25OH 61 09/03/2017     She notes that she has been following with Dr. [name] for macular degeneration and macular edema.   BMI is There is no height or weight on file to calculate BMI., she is working on diet and exercise. Wt Readings from Last 3 Encounters:  10/05/17 178 lb 9.6 oz (81 kg)  09/23/17 176 lb (79.8 kg)  09/03/17 178 lb (80.7 kg)     Medication Review  Current Outpatient Medications (Endocrine & Metabolic):  .  glimepiride (AMARYL) 2 MG tablet, TAKE 1/2 TO 1 TABLET BY MOUTH DAILY FOR DIABETES .  metFORMIN (GLUCOPHAGE-XR) 500 MG 24 hr tablet, TAKE 1 TO 2 TABLETS TWICE DAILY WITH FOOD AS DIRECTED FOR DIABETES      Current Outpatient Medications (Other):  Marland Kitchen  Calcium Citrate-Vitamin D (CITRACAL/VITAMIN D PO), Take 1 tablet by mouth daily. .  Cholecalciferol (VITAMIN D PO), Take 5,000 Units by mouth 2 (two) times daily. Reported on 02/07/2016 .  DULoxetine (CYMBALTA) 30 MG capsule, TAKE 1 CAPSULE (30 MG TOTAL) BY MOUTH DAILY. .  Magnesium 400 MG TABS, Take 1 tablet by mouth daily.  Allergies: Allergies  Allergen Reactions  . Gabapentin Swelling    Swelling in legs and feet    Current Problems (verified) has Diabetic polyneuropathy ([place]); Foot drop, bilateral; T2_NIDDM; Mixed hyperlipidemia; Vitamin D deficiency; Medication management; Essential hypertension; and Macular degeneration on their problem list.  Screening Tests Immunization History  Administered Date(s) Administered  . Influenza, High Dose Seasonal PF 05/12/2016  . Influenza-Unspecified 07/03/2017  . Pneumococcal Conjugate-13 02/07/2016  . Pneumococcal Polysaccharide-23 09/03/2017  . Td 02/07/2016    Preventative  care: Last colonoscopy: Cologuard has been ordered Last mammogram:  Last pap smear/pelvic exam: Hysterctomy   DEXA: Ordered   Prior vaccinations: TD or Tdap: Due  Influenza: 2015  Pneumococcal: Declined Prevnar13: Given today Shingles/Zostavax: 2012  Names of Other Physician/Practitioners you currently use: 1. [facility]- here for primary care 2. Dr. [name], eye doctor, last visit [id number]. Dr. Zenaida Niece, dentist, last visit 2017 Patient Care Team: Unk Pinto, MD as PCP - General (Internal Medicine) [name] Clent Demark, MD as Consulting Physician (Ophthalmology) Kathrynn Ducking, MD as Consulting Physician (Neurology) Monna Fam, MD as Consulting Physician (Ophthalmology) Earnstine Regal, PA-C as Physician Assistant (Obstetrics and Gynecology)  Surgical: She  has a past surgical history that includes Tonsillectomy; Abdominal hysterectomy; Hip surgery; Ankle fracture surgery; and Cataract extraction, bilateral. Family Her family history includes Diabetes in her brother and mother. Social history  She reports that she has never smoked. She has never used smokeless tobacco. She reports that she does not drink alcohol or use drugs.  MEDICARE WELLNESS OBJECTIVES: Physical activity:   Cardiac risk factors:   Depression/mood screen:   Depression screen [facility] 2/9 09/06/2017  Decreased Interest 0  Down, Depressed, Hopeless 0  PHQ - 2 Score 0  Altered sleeping -  Tired, decreased energy -  Change in appetite -  Feeling bad or failure about yourself  -  Trouble concentrating -  Moving slowly or fidgety/restless -  Suicidal thoughts -  PHQ-9 Score -  Difficult doing work/chores -    ADLs:  In your present state of health, do you have any difficulty performing the following activities: 09/06/2017 01/28/2017  Hearing? N N  Vision? N N  Difficulty concentrating or making decisions? N N  Walking or climbing stairs? N Y  Comment - has bilat foot  drop compensated by bilat ankle braces.   Dressing or bathing? N N  Doing errands, shopping? N (No Data)  Comment - Husband transports  Some recent data might be hidden     Cognitive Testing  Alert? Yes  Normal Appearance?Yes  Oriented to person? Yes  Place? Yes   Time? Yes  Recall of three objects?  Yes  Can perform simple calculations? Yes  Displays appropriate judgment?Yes  Can read the correct time from a watch face?Yes  EOL planning:     Objective:   There were no vitals filed for this visit. There is no height or weight on file to calculate BMI.  General appearance: alert, no distress, WD/WN,  female HEENT: normocephalic, sclerae anicteric, TMs pearly, nares patent, no discharge or erythema, pharynx normal Oral cavity: MMM, no lesions Neck: supple, no lymphadenopathy, no thyromegaly, no masses Heart: RRR, normal S1, S2, no murmurs Lungs: CTA bilaterally, no wheezes, rhonchi, or rales Abdomen: +bs, soft, non tender, non distended, no masses, no hepatomegaly, no splenomegaly Musculoskeletal: nontender, no swelling, no obvious deformity Extremities: no edema, no cyanosis, no clubbing Pulses: 2+ symmetric, upper and lower extremities, normal cap refill Neurological: alert, oriented x 3,  CN2-12 intact, strength normal upper extremities and lower extremities, sensation normal throughout, DTRs 2+ throughout, no cerebellar signs, gait normal Psychiatric: normal affect, behavior normal, pleasant  Breast: defer Gyn: defer Rectal: defer   Medicare Attestation I have personally reviewed: The patient's medical and social history Their use of alcohol, tobacco or illicit drugs Their current medications and supplements The patient's functional ability including ADLs,fall risks, home safety risks, cognitive, and hearing and visual impairment Diet and physical activities Evidence for depression or mood disorders  The patient's weight, height, BMI, and visual acuity have been  recorded in the chart.  I have made referrals, counseling, and provided education to the patient based on review of the above and I have provided the patient with a written personalized care plan for preventive services.     Vicie Mutters, PA-C   12/07/2017

## 2017-12-08 ENCOUNTER — Other Ambulatory Visit: Payer: Self-pay | Admitting: Physician Assistant

## 2017-12-09 ENCOUNTER — Ambulatory Visit: Payer: Self-pay | Admitting: Adult Health

## 2017-12-09 ENCOUNTER — Ambulatory Visit: Payer: Self-pay | Admitting: Physician Assistant

## 2017-12-15 DIAGNOSIS — E113512 Type 2 diabetes mellitus with proliferative diabetic retinopathy with macular edema, left eye: Secondary | ICD-10-CM | POA: Diagnosis not present

## 2017-12-15 DIAGNOSIS — H35371 Puckering of macula, right eye: Secondary | ICD-10-CM | POA: Diagnosis not present

## 2017-12-15 DIAGNOSIS — H359 Unspecified retinal disorder: Secondary | ICD-10-CM | POA: Diagnosis not present

## 2017-12-15 DIAGNOSIS — H3563 Retinal hemorrhage, bilateral: Secondary | ICD-10-CM | POA: Diagnosis not present

## 2017-12-15 DIAGNOSIS — E113511 Type 2 diabetes mellitus with proliferative diabetic retinopathy with macular edema, right eye: Secondary | ICD-10-CM | POA: Diagnosis not present

## 2017-12-21 DIAGNOSIS — E113512 Type 2 diabetes mellitus with proliferative diabetic retinopathy with macular edema, left eye: Secondary | ICD-10-CM | POA: Diagnosis not present

## 2018-02-02 DIAGNOSIS — H35372 Puckering of macula, left eye: Secondary | ICD-10-CM | POA: Diagnosis not present

## 2018-02-02 DIAGNOSIS — E113511 Type 2 diabetes mellitus with proliferative diabetic retinopathy with macular edema, right eye: Secondary | ICD-10-CM | POA: Diagnosis not present

## 2018-02-02 DIAGNOSIS — H35371 Puckering of macula, right eye: Secondary | ICD-10-CM | POA: Diagnosis not present

## 2018-02-02 DIAGNOSIS — H3563 Retinal hemorrhage, bilateral: Secondary | ICD-10-CM | POA: Diagnosis not present

## 2018-02-08 DIAGNOSIS — E113512 Type 2 diabetes mellitus with proliferative diabetic retinopathy with macular edema, left eye: Secondary | ICD-10-CM | POA: Diagnosis not present

## 2018-02-08 DIAGNOSIS — H35372 Puckering of macula, left eye: Secondary | ICD-10-CM | POA: Diagnosis not present

## 2018-02-25 ENCOUNTER — Encounter: Payer: Self-pay | Admitting: Internal Medicine

## 2018-03-09 DIAGNOSIS — H359 Unspecified retinal disorder: Secondary | ICD-10-CM | POA: Diagnosis not present

## 2018-03-09 DIAGNOSIS — E113512 Type 2 diabetes mellitus with proliferative diabetic retinopathy with macular edema, left eye: Secondary | ICD-10-CM | POA: Diagnosis not present

## 2018-03-09 DIAGNOSIS — E113511 Type 2 diabetes mellitus with proliferative diabetic retinopathy with macular edema, right eye: Secondary | ICD-10-CM | POA: Diagnosis not present

## 2018-03-09 DIAGNOSIS — H35371 Puckering of macula, right eye: Secondary | ICD-10-CM | POA: Diagnosis not present

## 2018-03-09 DIAGNOSIS — Z961 Presence of intraocular lens: Secondary | ICD-10-CM | POA: Diagnosis not present

## 2018-03-11 ENCOUNTER — Ambulatory Visit (INDEPENDENT_AMBULATORY_CARE_PROVIDER_SITE_OTHER): Payer: Medicare Other | Admitting: Internal Medicine

## 2018-03-11 ENCOUNTER — Encounter: Payer: Self-pay | Admitting: Internal Medicine

## 2018-03-11 VITALS — BP 134/82 | HR 72 | Temp 97.5°F | Resp 16 | Ht 65.0 in | Wt 172.2 lb

## 2018-03-11 DIAGNOSIS — E1122 Type 2 diabetes mellitus with diabetic chronic kidney disease: Secondary | ICD-10-CM | POA: Diagnosis not present

## 2018-03-11 DIAGNOSIS — Z136 Encounter for screening for cardiovascular disorders: Secondary | ICD-10-CM | POA: Diagnosis not present

## 2018-03-11 DIAGNOSIS — Z79899 Other long term (current) drug therapy: Secondary | ICD-10-CM

## 2018-03-11 DIAGNOSIS — I10 Essential (primary) hypertension: Secondary | ICD-10-CM | POA: Diagnosis not present

## 2018-03-11 DIAGNOSIS — E559 Vitamin D deficiency, unspecified: Secondary | ICD-10-CM | POA: Diagnosis not present

## 2018-03-11 DIAGNOSIS — E782 Mixed hyperlipidemia: Secondary | ICD-10-CM

## 2018-03-11 DIAGNOSIS — Z8249 Family history of ischemic heart disease and other diseases of the circulatory system: Secondary | ICD-10-CM | POA: Diagnosis not present

## 2018-03-11 DIAGNOSIS — N182 Chronic kidney disease, stage 2 (mild): Secondary | ICD-10-CM | POA: Diagnosis not present

## 2018-03-11 DIAGNOSIS — E0842 Diabetes mellitus due to underlying condition with diabetic polyneuropathy: Secondary | ICD-10-CM | POA: Diagnosis not present

## 2018-03-11 DIAGNOSIS — Z1212 Encounter for screening for malignant neoplasm of rectum: Secondary | ICD-10-CM

## 2018-03-11 DIAGNOSIS — Z1211 Encounter for screening for malignant neoplasm of colon: Secondary | ICD-10-CM

## 2018-03-11 NOTE — Patient Instructions (Signed)

## 2018-03-11 NOTE — Progress Notes (Signed)
[facility] Unk Pinto, M.D.     Uvaldo Bristle. Silverio Lay, P.A.-C Liane Comber, [place] [address] [place] [address] Telephone [phone] Telefax [phone]  Comprehensive Evaluation &  Examination     This very nice 67 y.o. MWF presents for a  comprehensive evaluation and management of multiple medical co-morbidities.  Patient has been followed for HTN, HLD,HTN, T2_DM w/PN & CKD3, Diab Retinopathy,wet AMD and Vitamin D Deficiency.     P_atient has labile HTN monitored expectantly with only diuretic therapy.  HTN predates circa 1990's. Patient's BP has been controlled at home and patient denies any cardiac symptoms as chest pain, palpitations, shortness of breath, dizziness or ankle swelling. Today's BP: 134/82      Patient's hyperlipidemia is controlled with diet and medications. Patient denies myalgias or other medication SE's. Last lipids were not at goal:  Lab Results  Component Value Date   CHOL 206 (H) 09/03/2017   HDL 59 09/03/2017   LDLCALC 109 (H) 09/03/2017   TRIG 269 (H) 09/03/2017   CHOLHDL 3.5 09/03/2017      Patient has T2_NIDDM predating since Gestational Diabetes in the 1980's, then in a honeymoon phase til starting Metformin in 2006.  Patient has painful Peripheral Neuropathy with Bilat ankle braces for bilat foot drop is  followed by Dr Jannifer Franklin. Her neuropathy pain is improved on Duloxetine. Patient has  "Wet" MD followed by Dr [name] and is receiving monthly IO Avastin injections.  Patient denies reactive hypoglycemic symptoms, visual blurring or diabetic polys.  CBG's usu range less than 140 mg%.  Her last A1c was not at goal: Lab Results  Component Value Date   HGBA1C 8.7 (H) 09/03/2017      Finally, patient has history of Vitamin D Deficiency ("26"/Jan 2016) and last Vitamin D was at goal: Lab Results  Component Value Date   VD25OH 61 09/03/2017   Current Outpatient  Medications on File Prior to Visit  Medication Sig  . Calcium Citrate-Vitamin D (CITRACAL/VITAMIN D PO) Take 1 tablet by mouth daily.  . Cholecalciferol (VITAMIN D PO) Take 1,000 Units by mouth 2 (two) times daily. Reported on 02/07/2016  . DULoxetine (CYMBALTA) 30 MG capsule TAKE 1 CAPSULE (30 MG TOTAL) BY MOUTH DAILY.  Marland Kitchen glimepiride (AMARYL) 2 MG tablet TAKE 1/2 TO 1 TABLET BY MOUTH DAILY FOR DIABETES  . Magnesium 250 MG TABS Take 2 tablets by mouth daily.   . metFORMIN (GLUCOPHAGE-XR) 500 MG 24 hr tablet TAKE 1 TO 2 TABLETS TWICE DAILY WITH FOOD AS DIRECTED FOR DIABETES   No current facility-administered medications on file prior to visit.    Allergies  Allergen Reactions  . Gabapentin Swelling    Swelling in legs and feet   Past Medical History:  Diagnosis Date  . Abnormality of gait 07/28/2013  . Diabetes ([place])   . Diabetic peripheral neuropathy ([place])   . Diabetic retinopathy ([place])   . Foot drop, bilateral 07/31/2014  . Peripheral edema   . Polyneuropathy in diabetes(357.2) 07/28/2013   Health Maintenance  Topic Date Due  . COLONOSCOPY  05/14/2000  . DEXA SCAN  05/15/2015  . OPHTHALMOLOGY EXAM  06/19/2017  . URINE MICROALBUMIN  01/27/2018  . HEMOGLOBIN A1C  03/03/2018  . INFLUENZA VACCINE  03/18/2018  . MAMMOGRAM  04/18/2018  . FOOT EXAM  03/12/2019  . TETANUS/TDAP  02/06/2026  . PNA vac Low Risk Adult  Completed   Immunization History  Administered Date(s) Administered  .  Influenza, High Dose Seasonal PF 05/12/2016  . Influenza-Unspecified 07/03/2017  . Pneumococcal Conjugate-13 02/07/2016  . Pneumococcal Polysaccharide-23 09/03/2017  . Td 02/07/2016   Last Colon - Never & she is agreeable to a Cologard.   Last MGM - lat 04/18/2016 and reminded overdue & to schedule  Past Surgical History:  Procedure Laterality Date  . ABDOMINAL HYSTERECTOMY    . ANKLE FRACTURE SURGERY     left  . CATARACT EXTRACTION, BILATERAL    . HIP SURGERY     right  . TONSILLECTOMY      Family History  Problem Relation Age of Onset  . Diabetes Mother   . Diabetes Brother   . Neuropathy Neg Hx    Social History   Tobacco Use  . Smoking status: Never Smoker  . Smokeless tobacco: Never Used  Substance Use Topics  . Alcohol use: No  . Drug use: No    ROS Constitutional: Denies fever, chills, weight loss/gain, headaches, insomnia,  night sweats, and change in appetite. Does c/o fatigue. Eyes: Denies redness, blurred vision, diplopia, discharge, itchy, watery eyes.  ENT: Denies discharge, congestion, post nasal drip, epistaxis, sore throat, earache, hearing loss, dental pain, Tinnitus, Vertigo, Sinus pain, snoring.  Cardio: Denies chest pain, palpitations, irregular heartbeat, syncope, dyspnea, diaphoresis, orthopnea, PND, claudication, edema Respiratory: denies cough, dyspnea, DOE, pleurisy, hoarseness, laryngitis, wheezing.  Gastrointestinal: Denies dysphagia, heartburn, reflux, water brash, pain, cramps, nausea, vomiting, bloating, diarrhea, constipation, hematemesis, melena, hematochezia, jaundice, hemorrhoids Genitourinary: Denies dysuria, frequency, urgency, nocturia, hesitancy, discharge, hematuria, flank pain Breast: Breast lumps, nipple discharge, bleeding.  Musculoskeletal: Denies arthralgia, myalgia, stiffness, Jt. Swelling, pain, limp, and strain/sprain. Denies falls. Skin: Denies puritis, rash, hives, warts, acne, eczema, changing in skin lesion Neuro: No weakness, tremor, incoordination, spasms, paresthesia, pain Psychiatric: Denies confusion, memory loss, sensory loss. Denies Depression. Endocrine: Denies change in weight, skin, hair change, nocturia, and paresthesia, diabetic polys, visual blurring, hyper / hypo glycemic episodes.  Heme/Lymph: No excessive bleeding, bruising, enlarged lymph nodes.  Physical Exam  BP 134/82   Pulse 72   Temp (!) 97.5 F (36.4 C)   Resp 16   Ht 5\' 5"  (1.651 m)   Wt 172 lb 3.2 oz (78.1 kg)   BMI 28.66 kg/m    General Appearance: Well nourished, well groomed and in no apparent distress.  Eyes: PERRLA, EOMs, conjunctiva no swelling or erythema, normal fundi and vessels. Sinuses: No frontal/maxillary tenderness ENT/Mouth: EACs patent / TMs  nl. Nares clear without erythema, swelling, mucoid exudates. Oral hygiene is good. No erythema, swelling, or exudate. Tongue normal, non-obstructing. Tonsils not swollen or erythematous. Hearing normal.  Neck: Supple, thyroid not palpable. No bruits, nodes or JVD. Respiratory: Respiratory effort normal.  BS equal and clear bilateral without rales, rhonci, wheezing or stridor. Cardio: Heart sounds are normal with regular rate and rhythm and no murmurs, rubs or gallops. Peripheral pulses are normal and equal bilaterally without edema. No aortic or femoral bruits. Chest: symmetric with normal excursions and percussion. Breasts: Symmetric, without lumps, nipple discharge, retractions, or fibrocystic changes.  Abdomen: Flat, soft with bowel sounds active. Nontender, no guarding, rebound, hernias, masses, or organomegaly.  Lymphatics: Non tender without lymphadenopathy.  Musculoskeletal: Full ROM with bilat ankle braces with broad base gait. Skin: Warm and dry without rashes, lesions, cyanosis, clubbing or  ecchymosis.  Neuro: Cranial nerves intact, reflexes equal bilaterally. Normal muscle tone, no cerebellar symptoms. Sensation decreased to touch, vibratory and Monofilament to the toes bilaterally. Pysch: Alert and oriented X 3,  normal affect, Insight and Judgment appropriate.   Assessment and Plan 1. Essential hypertension  - EKG 12-Lead - Urinalysis, Routine w reflex microscopic - Microalbumin / creatinine urine ratio - CBC with Differential/Platelet - COMPLETE METABOLIC PANEL WITH GFR - Magnesium - TSH  2. Hyperlipidemia, mixed  - EKG 12-Lead - Lipid panel - TSH  3. Type 2 diabetes mellitus with CKD2  w/o use of insulin (HCC)  - EKG 12-Lead -  Urinalysis, Routine w reflex microscopic - Microalbumin / creatinine urine ratio - HM DIABETES FOOT EXAM - LOW EXTREMITY NEUR EXAM DOCUM - Hemoglobin A1c - Insulin, random  4. Vitamin D deficiency  - VITAMIN D 25 Hydroxyl  5. Diabetic polyneuropathy associated with diabetes mellitus HCC)  - HM DIABETES FOOT EXAM - LOW EXTREMITY NEUR EXAM DOCUM  6. Screening for colorectal cancer  - POC Hemoccult Bld/Stl (3-Cd Home Screen); Future  7. FH: hypertension  - EKG 12-Lead  8. Screening for ischemic heart disease  - EKG 12-Lead  9. Medication management  - Urinalysis, Routine w reflex microscopic - Microalbumin / creatinine urine ratio - CBC with Differential/Platelet - COMPLETE METABOLIC PANEL WITH GFR - Magnesium - Lipid panel - TSH - Hemoglobin A1c - Insulin, random - VITAMIN D 25 Hydroxyl      Patient was counseled in prudent diet to achieve/maintain BMI less than 25 for weight control, BP monitoring, regular exercise and medications. Discussed med's effects and SE's. Screening labs and tests as requested with regular follow-up as recommended. Over 40 minutes of exam, counseling, chart review and high complex critical decision making was performed.

## 2018-03-12 ENCOUNTER — Other Ambulatory Visit: Payer: Self-pay | Admitting: Internal Medicine

## 2018-03-12 LAB — CBC WITH DIFFERENTIAL/PLATELET
Basophils Absolute: 50 cells/uL (ref 0–200)
Basophils Relative: 0.8 %
Eosinophils Absolute: 265 cells/uL (ref 15–500)
Eosinophils Relative: 4.2 %
HCT: 36.1 % (ref 35.0–45.0)
Hemoglobin: 12.1 g/dL (ref 11.7–15.5)
Lymphs Abs: 1638 cells/uL (ref 850–3900)
MCH: 30 pg (ref 27.0–33.0)
MCHC: 33.5 g/dL (ref 32.0–36.0)
MCV: 89.6 fL (ref 80.0–100.0)
MPV: 9.6 fL (ref 7.5–12.5)
Monocytes Relative: 7.5 %
Neutro Abs: 3875 cells/uL (ref 1500–7800)
Neutrophils Relative %: 61.5 %
Platelets: 400 10*3/uL (ref 140–400)
RBC: 4.03 10*6/uL (ref 3.80–5.10)
RDW: 13.5 % (ref 11.0–15.0)
Total Lymphocyte: 26 %
WBC mixed population: 473 cells/uL (ref 200–950)
WBC: 6.3 10*3/uL (ref 3.8–10.8)

## 2018-03-12 LAB — URINALYSIS, ROUTINE W REFLEX MICROSCOPIC
Bilirubin Urine: NEGATIVE
Glucose, UA: NEGATIVE
Hgb urine dipstick: NEGATIVE
Hyaline Cast: NONE SEEN /LPF
Nitrite: POSITIVE — AB
Specific Gravity, Urine: 1.029 (ref 1.001–1.03)
pH: <=5 (ref 5.0–8.0)

## 2018-03-12 LAB — COMPLETE METABOLIC PANEL WITH GFR
AG Ratio: 1.9 (calc) (ref 1.0–2.5)
ALT: 14 U/L (ref 6–29)
AST: 14 U/L (ref 10–35)
Albumin: 4.2 g/dL (ref 3.6–5.1)
Alkaline phosphatase (APISO): 59 U/L (ref 33–130)
BUN: 25 mg/dL (ref 7–25)
CO2: 28 mmol/L (ref 20–32)
Calcium: 9.9 mg/dL (ref 8.6–10.4)
Chloride: 103 mmol/L (ref 98–110)
Creat: 0.9 mg/dL (ref 0.50–0.99)
GFR, Est African American: 77 mL/min/{1.73_m2} (ref >=60)
GFR, Est Non African American: 66 mL/min/{1.73_m2} (ref >=60)
Globulin: 2.2 g/dL (calc) (ref 1.9–3.7)
Glucose, Bld: 118 mg/dL — ABNORMAL HIGH (ref 65–99)
Potassium: 4.7 mmol/L (ref 3.5–5.3)
Sodium: 140 mmol/L (ref 135–146)
Total Bilirubin: 0.3 mg/dL (ref 0.2–1.2)
Total Protein: 6.4 g/dL (ref 6.1–8.1)

## 2018-03-12 LAB — TSH: TSH: 1.41 mIU/L (ref 0.40–4.50)

## 2018-03-12 LAB — HEMOGLOBIN A1C
Hgb A1c MFr Bld: 7.4 % of total Hgb — ABNORMAL HIGH (ref <5.7)
Mean Plasma Glucose: 166 (calc)
eAG (mmol/L): 9.2 (calc)

## 2018-03-12 LAB — MICROALBUMIN / CREATININE URINE RATIO
Creatinine, Urine: 210 mg/dL (ref 20–275)
Microalb Creat Ratio: 9 mcg/mg creat (ref <30)
Microalb, Ur: 1.8 mg/dL

## 2018-03-12 LAB — VITAMIN D 25 HYDROXY (VIT D DEFICIENCY, FRACTURES): Vit D, 25-Hydroxy: 49 ng/mL (ref 30–100)

## 2018-03-12 LAB — LIPID PANEL
Cholesterol: 226 mg/dL — ABNORMAL HIGH (ref <200)
HDL: 55 mg/dL (ref >50)
LDL Cholesterol (Calc): 133 mg/dL (calc) — ABNORMAL HIGH
Non-HDL Cholesterol (Calc): 171 mg/dL (calc) — ABNORMAL HIGH (ref <130)
Total CHOL/HDL Ratio: 4.1 (calc) (ref <5.0)
Triglycerides: 236 mg/dL — ABNORMAL HIGH (ref <150)

## 2018-03-12 LAB — MAGNESIUM: Magnesium: 1.6 mg/dL (ref 1.5–2.5)

## 2018-03-12 LAB — INSULIN, RANDOM: Insulin: 4.9 u[IU]/mL (ref 2.0–19.6)

## 2018-03-12 MED ORDER — CIPROFLOXACIN HCL 250 MG PO TABS: 250.0000 mg | ORAL_TABLET | Freq: Two times a day (BID) | ORAL | 0 refills | Status: AC

## 2018-03-14 ENCOUNTER — Encounter: Payer: Self-pay | Admitting: Internal Medicine

## 2018-04-06 DIAGNOSIS — E113512 Type 2 diabetes mellitus with proliferative diabetic retinopathy with macular edema, left eye: Secondary | ICD-10-CM | POA: Diagnosis not present

## 2018-04-06 DIAGNOSIS — H35372 Puckering of macula, left eye: Secondary | ICD-10-CM | POA: Diagnosis not present

## 2018-04-13 ENCOUNTER — Ambulatory Visit (INDEPENDENT_AMBULATORY_CARE_PROVIDER_SITE_OTHER): Payer: Medicare Other

## 2018-04-13 DIAGNOSIS — E113511 Type 2 diabetes mellitus with proliferative diabetic retinopathy with macular edema, right eye: Secondary | ICD-10-CM | POA: Diagnosis not present

## 2018-04-13 DIAGNOSIS — H35371 Puckering of macula, right eye: Secondary | ICD-10-CM | POA: Diagnosis not present

## 2018-04-13 DIAGNOSIS — Z1212 Encounter for screening for malignant neoplasm of rectum: Secondary | ICD-10-CM | POA: Diagnosis not present

## 2018-04-13 DIAGNOSIS — Z1211 Encounter for screening for malignant neoplasm of colon: Secondary | ICD-10-CM | POA: Diagnosis not present

## 2018-04-13 DIAGNOSIS — Z79899 Other long term (current) drug therapy: Secondary | ICD-10-CM | POA: Diagnosis not present

## 2018-04-13 DIAGNOSIS — H35372 Puckering of macula, left eye: Secondary | ICD-10-CM | POA: Diagnosis not present

## 2018-04-13 NOTE — Progress Notes (Signed)
Patient presents to the office for a nurse visit to recheck urine. Patient has completed the antibiotics and is no longer having any signs or symptoms.

## 2018-04-14 LAB — URINE CULTURE
MICRO NUMBER:: 91025291
SPECIMEN QUALITY:: ADEQUATE

## 2018-04-14 LAB — URINALYSIS, ROUTINE W REFLEX MICROSCOPIC
Bacteria, UA: NONE SEEN /HPF
Bilirubin Urine: NEGATIVE
Glucose, UA: NEGATIVE
Hgb urine dipstick: NEGATIVE
Hyaline Cast: NONE SEEN /LPF
Ketones, ur: NEGATIVE
Nitrite: NEGATIVE
Protein, ur: NEGATIVE
RBC / HPF: NONE SEEN /HPF (ref 0–2)
Specific Gravity, Urine: 1.01 (ref 1.001–1.03)
pH: 7.5 (ref 5.0–8.0)

## 2018-04-18 ENCOUNTER — Other Ambulatory Visit: Payer: Self-pay | Admitting: Adult Health

## 2018-04-20 NOTE — Telephone Encounter (Signed)
Refill submitted for Cymbalta to [facility] on [address]. Patient is scheduled for f/u on 06/24/2018. MB RN.

## 2018-04-21 ENCOUNTER — Telehealth: Payer: Self-pay | Admitting: *Deleted

## 2018-04-21 ENCOUNTER — Encounter: Payer: Self-pay | Admitting: *Deleted

## 2018-04-21 LAB — COLOGUARD: Cologuard: NEGATIVE

## 2018-04-21 NOTE — Telephone Encounter (Signed)
Patient is aware of her negative Cologuard test result.

## 2018-05-03 DIAGNOSIS — E113511 Type 2 diabetes mellitus with proliferative diabetic retinopathy with macular edema, right eye: Secondary | ICD-10-CM | POA: Diagnosis not present

## 2018-05-08 ENCOUNTER — Other Ambulatory Visit: Payer: Self-pay | Admitting: Adult Health

## 2018-05-24 DIAGNOSIS — Z23 Encounter for immunization: Secondary | ICD-10-CM | POA: Diagnosis not present

## 2018-05-31 DIAGNOSIS — E113512 Type 2 diabetes mellitus with proliferative diabetic retinopathy with macular edema, left eye: Secondary | ICD-10-CM | POA: Diagnosis not present

## 2018-05-31 DIAGNOSIS — H3563 Retinal hemorrhage, bilateral: Secondary | ICD-10-CM | POA: Diagnosis not present

## 2018-05-31 DIAGNOSIS — H35371 Puckering of macula, right eye: Secondary | ICD-10-CM | POA: Diagnosis not present

## 2018-05-31 DIAGNOSIS — E113511 Type 2 diabetes mellitus with proliferative diabetic retinopathy with macular edema, right eye: Secondary | ICD-10-CM | POA: Diagnosis not present

## 2018-05-31 DIAGNOSIS — Z961 Presence of intraocular lens: Secondary | ICD-10-CM | POA: Diagnosis not present

## 2018-06-01 DIAGNOSIS — H35372 Puckering of macula, left eye: Secondary | ICD-10-CM | POA: Diagnosis not present

## 2018-06-01 DIAGNOSIS — E113512 Type 2 diabetes mellitus with proliferative diabetic retinopathy with macular edema, left eye: Secondary | ICD-10-CM | POA: Diagnosis not present

## 2018-06-01 LAB — HM DIABETES EYE EXAM

## 2018-06-03 ENCOUNTER — Other Ambulatory Visit: Payer: Self-pay | Admitting: Internal Medicine

## 2018-06-07 ENCOUNTER — Encounter: Payer: Self-pay | Admitting: Adult Health

## 2018-06-07 ENCOUNTER — Ambulatory Visit (INDEPENDENT_AMBULATORY_CARE_PROVIDER_SITE_OTHER): Payer: Medicare Other | Admitting: Adult Health

## 2018-06-07 VITALS — BP 132/82 | HR 64 | Temp 97.2°F | Ht 65.0 in | Wt 174.0 lb

## 2018-06-07 DIAGNOSIS — R300 Dysuria: Secondary | ICD-10-CM

## 2018-06-07 DIAGNOSIS — R1012 Left upper quadrant pain: Secondary | ICD-10-CM

## 2018-06-07 MED ORDER — CIPROFLOXACIN HCL 500 MG PO TABS: 500.0000 mg | ORAL_TABLET | Freq: Two times a day (BID) | ORAL | 0 refills | Status: DC

## 2018-06-07 NOTE — Progress Notes (Signed)
Assessment and Plan:  Grace [name] was seen today for dysuria and urinary urgency.  Diagnoses and all orders for this visit:  Dysuria UTI versus OAB versus vaginal dryness - will check UA, C&S. Will start ABX now due to pain -     Urinalysis w microscopic + reflex cultur -      Cipro 500 mg BID x 7 days  LUQ abdominal pain Ongoing ~1 year, not improving; patient is quite anxious regarding this; neg cologuard in 04/2018 Will check basic labs, discussed labs would be normal for acid reflux, suggested she try OTC agent and also sennakot for constipation Follow up PRN if not improving -     US Abdomen Complete; Future -     CBC with Differential/Platelet -     COMPLETE METABOLIC PANEL WITH GFR -     Amylase -     Lipase  Further disposition pending results of labs. Discussed med's effects and SE's.   Over 15 minutes of exam, counseling, chart review, and critical decision making was performed.   Future Appointments  Date Time Provider [place]  06/24/2018  3:30 PM Kathrynn Ducking, MD [facility] None  07/20/2018  9:00 AM Vicie Mutters, PA-C [facility] None  10/26/2018  9:30 AM Unk Pinto, MD [facility] None  05/03/2019  9:00 AM Unk Pinto, MD [facility] None    ------------------------------------------------------------------------------------------------------------------  HPI BP 132/82   Pulse 64   Temp (!) 97.2 F (36.2 C)   Ht 5\' 5"  (1.651 m)   Wt 174 lb (78.9 kg)   SpO2 98%   BMI 28.96 kg/m   67 y.o.female presents for evaluation of dysuria that began 4 days ago; somewhat improving; she denies changes in urine character, fever/chills, nausea/vomiting, vaginal discharge. She does endorse pelvic pressure. Last UTI in 02/2018 and was treated by cipro.   She is not currently sexually active, last remote.   Intermittent ongoing LUQ pain, sense of "something growing" /distention of left abdominal area, with ongoing nausea, occasional vomiting, alternating  diarrhea and constipation ongoing for months~1 year. She has never had a colonoscopy, but had negative cologuard recently on 04/22/2018. No abdominal imaging available for review.    Past Medical History:  Diagnosis Date  . Abnormality of gait 07/28/2013  . Diabetes ([place])   . Diabetic peripheral neuropathy ([place])   . Diabetic retinopathy ([place])   . Foot drop, bilateral 07/31/2014  . Peripheral edema   . Polyneuropathy in diabetes(357.2) 07/28/2013     Allergies  Allergen Reactions  . Gabapentin Swelling    Swelling in legs and feet    Current Outpatient Medications on File Prior to Visit  Medication Sig  . Calcium Citrate-Vitamin D (CITRACAL/VITAMIN D PO) Take 1 tablet by mouth daily.  . Cholecalciferol (VITAMIN D PO) Take 1,000 Units by mouth 2 (two) times daily. Reported on 02/07/2016  . DULoxetine (CYMBALTA) 30 MG capsule TAKE 1 CAPSULE BY MOUTH EVERY DAY  . glimepiride (AMARYL) 2 MG tablet TAKE 1/2 TO 1 TABLET BY MOUTH DAILY FOR DIABETES  . Magnesium 250 MG TABS Take 2 tablets by mouth daily.   . metFORMIN (GLUCOPHAGE-XR) 500 MG 24 hr tablet TAKE 1 TO 2 TABLETS TWICE DAILY WITH FOOD AS DIRECTED FOR DIABETES   No current facility-administered medications on file prior to visit.     ROS: all negative except above.   Physical Exam:  BP 132/82   Pulse 64   Temp (!) 97.2 F (36.2 C)   Ht 5\' 5"  (1.651 m)  Wt 174 lb (78.9 kg)   SpO2 98%   BMI 28.96 kg/m   General Appearance: Well nourished, in no apparent distress. Eyes: PERRLA, conjunctiva no swelling or erythema ENT/Mouth: Ext aud canals clear, TMs without erythema, bulging. No erythema, swelling, or exudate on post pharynx.  Hearing normal.  Neck: Supple, thyroid normal.  Respiratory: Respiratory effort normal, BS equal bilaterally without rales, rhonchi, wheezing or stridor.  Cardio: RRR with no MRGs. Brisk peripheral pulses without edema.  Abdomen: Soft, + BS.  Vaguely tender through LUQ/LLQ, no guarding, rebound,  notable hernias, masses. Lymphatics: Non tender without lymphadenopathy.  Musculoskeletal: Unsteady gait.  Skin: Warm, dry without rashes, lesions, ecchymosis.  Neuro: Cranial nerves intact. Normal muscle tone, no cerebellar symptoms. Sensation intact.  Psych: Awake and oriented X 3, normal affect, Insight and Judgment appropriate.     Izora Ribas, NP 4:10 PM [facility]

## 2018-06-07 NOTE — Patient Instructions (Signed)
Dysuria Dysuria is pain or discomfort while urinating. The pain or discomfort may be felt in the tube that carries urine out of the bladder (urethra) or in the surrounding tissue of the genitals. The pain may also be felt in the groin area, lower abdomen, and lower back. You may have to urinate frequently or have the sudden feeling that you have to urinate (urgency). Dysuria can affect both men and women, but is more common in women. Dysuria can be caused by many different things, including:  Urinary tract infection in women.  Infection of the kidney or bladder.  Kidney stones or bladder stones.  Certain sexually transmitted infections (STIs), such as chlamydia.  Dehydration.  Inflammation of the vagina.  Use of certain medicines.  Use of certain soaps or scented products that cause irritation.  Follow these instructions at home: Watch your dysuria for any changes. The following actions may help to reduce any discomfort you are feeling:  Drink enough fluid to keep your urine clear or pale yellow.  Empty your bladder often. Avoid holding urine for long periods of time.  After a bowel movement or urination, women should cleanse from front to back, using each tissue only once.  Empty your bladder after sexual intercourse.  Take medicines only as directed by your health care provider.  If you were prescribed an antibiotic medicine, finish it all even if you start to feel better.  Avoid caffeine, tea, and alcohol. They can irritate the bladder and make dysuria worse. In men, alcohol may irritate the prostate.  Keep all follow-up visits as directed by your health care provider. This is important.  If you had any tests done to find the cause of dysuria, it is your responsibility to obtain your test results. Ask the lab or department performing the test when and how you will get your results. Talk with your health care provider if you have any questions about your results.  Contact a  health care provider if:  You develop pain in your back or sides.  You have a fever.  You have nausea or vomiting.  You have blood in your urine.  You are not urinating as often as you usually do. Get help right away if:  You pain is severe and not relieved with medicines.  You are unable to hold down any fluids.  You or someone else notices a change in your mental function.  You have a rapid heartbeat at rest.  You have shaking or chills.  You feel extremely weak. This information is not intended to replace advice given to you by your health care provider. Make sure you discuss any questions you have with your health care provider. Document Released: 05/02/2004 Document Revised: 01/10/2016 Document Reviewed: 03/30/2014 Elsevier Interactive Patient Education  2018 [employer].   Ciprofloxacin tablets What is this medicine? CIPROFLOXACIN (sip roe FLOX a sin) is a quinolone antibiotic. It is used to treat certain kinds of bacterial infections. It will not work for colds, flu, or other viral infections. This medicine may be used for other purposes; ask your health care provider or pharmacist if you have questions. COMMON BRAND NAME(S): Cipro What should I tell my health care provider before I take this medicine? They need to know if you have any of these conditions: -bone problems -history of low levels of potassium in the blood -joint problems -irregular heartbeat -kidney disease -myasthenia gravis -seizures -tendon problems -tingling of the fingers or toes, or other nerve disorder -an unusual or  allergic reaction to ciprofloxacin, other antibiotics or medicines, foods, dyes, or preservatives -pregnant or trying to get pregnant -breast-feeding How should I use this medicine? Take this medicine by mouth with a glass of water. Follow the directions on the prescription label. Take your medicine at regular intervals. Do not take your medicine more often than directed.  Take all of your medicine as directed even if you think your are better. Do not skip doses or stop your medicine early. You can take this medicine with food or on an empty stomach. It can be taken with a meal that contains dairy or calcium, but do not take it alone with a dairy product, like milk or yogurt or calcium-fortified juice. A special MedGuide will be given to you by the pharmacist with each prescription and refill. Be sure to read this information carefully each time. Talk to your pediatrician regarding the use of this medicine in children. Special care may be needed. Overdosage: If you think you have taken too much of this medicine contact a poison control center or emergency room at once. NOTE: This medicine is only for you. Do not share this medicine with others. What if I miss a dose? If you miss a dose, take it as soon as you can. If it is almost time for your next dose, take only that dose. Do not take double or extra doses. What may interact with this medicine? Do not take this medicine with any of the following medications: -cisapride -dofetilide -dronedarone -flibanserin -lomitapide -pimozide -thioridazine -tizanidine -ziprasidone This medicine may also interact with the following medications: -antacids -birth control pills -caffeine -certain medicines for diabetes, like glipizide or glyburide -certain medicines that treat or prevent blood clots like warfarin -clozapine -cyclosporine -didanosine (ddI) buffered tablets or powder -duloxetine -lanthanum carbonate -lidocaine -methotrexate -multivitamins -NSAIDS, medicines for pain and inflammation, like ibuprofen or naproxen -olanzapine -omeprazole -other medicines that prolong the QT interval (cause an abnormal heart rhythm) -phenytoin -probenecid -ropinirole -sevelamer -sildenafil -sucralfate -theophylline -zolpidem This list may not describe all possible interactions. Give your health care provider a  list of all the medicines, herbs, non-prescription drugs, or dietary supplements you use. Also tell them if you smoke, drink alcohol, or use illegal drugs. Some items may interact with your medicine. What should I watch for while using this medicine? Tell your doctor or health care professional if your symptoms do not improve. Do not treat diarrhea with over the counter products. Contact your doctor if you have diarrhea that lasts more than 2 days or if it is severe and watery. You may get drowsy or dizzy. Do not drive, use machinery, or do anything that needs mental alertness until you know how this medicine affects you. Do not stand or sit up quickly, especially if you are an older patient. This reduces the risk of dizzy or fainting spells. This medicine can make you more sensitive to the sun. Keep out of the sun. If you cannot avoid being in the sun, wear protective clothing and use sunscreen. Do not use sun lamps or tanning beds/booths. Avoid antacids, aluminum, calcium, iron, magnesium, and zinc products for 6 hours before and 2 hours after taking a dose of this medicine. What side effects may I notice from receiving this medicine? Side effects that you should report to your doctor or health care professional as soon as possible: -allergic reactions like skin rash or hives, swelling of the face, lips, or tongue -anxious -confusion -depressed mood -diarrhea -fast, irregular heartbeat -hallucination, loss  of contact with reality -joint, muscle, or tendon pain or swelling -pain, tingling, numbness in the hands or feet -suicidal thoughts or other mood changes -sunburn -unusually weak or tired Side effects that usually do not require medical attention (report to your doctor or health care professional if they continue or are bothersome): -dry mouth -headache -nausea -trouble sleeping This list may not describe all possible side effects. Call your doctor for medical advice about side  effects. You may report side effects to FDA at 1-800-FDA-1088. Where should I keep my medicine? Keep out of the reach of children. Store at room temperature below 30 degrees C (86 degrees F). Keep container tightly closed. Throw away any unused medicine after the expiration date. NOTE: This sheet is a summary. It may not cover all possible information. If you have questions about this medicine, talk to your doctor, pharmacist, or health care provider.  2018 Elsevier/Gold Standard (2016-03-14 14:42:02)

## 2018-06-09 LAB — CBC WITH DIFFERENTIAL/PLATELET
Basophils Absolute: 72 cells/uL (ref 0–200)
Basophils Relative: 1 %
Eosinophils Absolute: 338 cells/uL (ref 15–500)
Eosinophils Relative: 4.7 %
HCT: 35.7 % (ref 35.0–45.0)
Hemoglobin: 11.9 g/dL (ref 11.7–15.5)
Lymphs Abs: 2390 cells/uL (ref 850–3900)
MCH: 30.4 pg (ref 27.0–33.0)
MCHC: 33.3 g/dL (ref 32.0–36.0)
MCV: 91.3 fL (ref 80.0–100.0)
MPV: 9.7 fL (ref 7.5–12.5)
Monocytes Relative: 7.8 %
Neutro Abs: 3838 cells/uL (ref 1500–7800)
Neutrophils Relative %: 53.3 %
Platelets: 409 10*3/uL — ABNORMAL HIGH (ref 140–400)
RBC: 3.91 10*6/uL (ref 3.80–5.10)
RDW: 13 % (ref 11.0–15.0)
Total Lymphocyte: 33.2 %
WBC mixed population: 562 cells/uL (ref 200–950)
WBC: 7.2 10*3/uL (ref 3.8–10.8)

## 2018-06-09 LAB — URINALYSIS W MICROSCOPIC + REFLEX CULTURE
Bilirubin Urine: NEGATIVE
Glucose, UA: NEGATIVE
Hgb urine dipstick: NEGATIVE
Hyaline Cast: NONE SEEN /LPF
Ketones, ur: NEGATIVE
Nitrites, Initial: NEGATIVE
Protein, ur: NEGATIVE
RBC / HPF: NONE SEEN /HPF (ref 0–2)
Specific Gravity, Urine: 1.019 (ref 1.001–1.03)
pH: 7 (ref 5.0–8.0)

## 2018-06-09 LAB — COMPLETE METABOLIC PANEL WITH GFR
AG Ratio: 1.8 (calc) (ref 1.0–2.5)
ALT: 19 U/L (ref 6–29)
AST: 16 U/L (ref 10–35)
Albumin: 4.1 g/dL (ref 3.6–5.1)
Alkaline phosphatase (APISO): 61 U/L (ref 33–130)
BUN: 19 mg/dL (ref 7–25)
CO2: 30 mmol/L (ref 20–32)
Calcium: 9.9 mg/dL (ref 8.6–10.4)
Chloride: 101 mmol/L (ref 98–110)
Creat: 0.9 mg/dL (ref 0.50–0.99)
GFR, Est African American: 76 mL/min/{1.73_m2} (ref >=60)
GFR, Est Non African American: 66 mL/min/{1.73_m2} (ref >=60)
Globulin: 2.3 g/dL (calc) (ref 1.9–3.7)
Glucose, Bld: 91 mg/dL (ref 65–99)
Potassium: 5 mmol/L (ref 3.5–5.3)
Sodium: 139 mmol/L (ref 135–146)
Total Bilirubin: 0.2 mg/dL (ref 0.2–1.2)
Total Protein: 6.4 g/dL (ref 6.1–8.1)

## 2018-06-09 LAB — URINE CULTURE
MICRO NUMBER:: 91268258
SPECIMEN QUALITY:: ADEQUATE

## 2018-06-09 LAB — AMYLASE: Amylase: 41 U/L (ref 21–101)

## 2018-06-09 LAB — LIPASE: Lipase: 54 U/L (ref 7–60)

## 2018-06-09 LAB — CULTURE INDICATED

## 2018-06-16 ENCOUNTER — Telehealth: Payer: Self-pay

## 2018-06-16 NOTE — Telephone Encounter (Signed)
Patient would like to know what her lab results are.

## 2018-06-17 NOTE — Telephone Encounter (Signed)
Patient notified about culture results. No other questions

## 2018-06-24 ENCOUNTER — Ambulatory Visit: Payer: Medicare Other | Admitting: Neurology

## 2018-06-24 ENCOUNTER — Telehealth: Payer: Self-pay | Admitting: Neurology

## 2018-06-24 NOTE — Telephone Encounter (Signed)
This patient canceled the same day of a revisit appointment. 

## 2018-06-28 ENCOUNTER — Encounter: Payer: Self-pay | Admitting: Neurology

## 2018-07-05 ENCOUNTER — Ambulatory Visit
Admission: RE | Admit: 2018-07-05 | Discharge: 2018-07-05 | Disposition: A | Discharge status: Home / Self Care | Payer: Medicare Other | Source: Ambulatory Visit | Attending: Adult Health | Admitting: Adult Health

## 2018-07-05 ENCOUNTER — Encounter: Payer: Self-pay | Admitting: Adult Health

## 2018-07-05 DIAGNOSIS — K828 Other specified diseases of gallbladder: Secondary | ICD-10-CM | POA: Diagnosis not present

## 2018-07-05 DIAGNOSIS — K8020 Calculus of gallbladder without cholecystitis without obstruction: Secondary | ICD-10-CM | POA: Diagnosis not present

## 2018-07-05 DIAGNOSIS — K760 Fatty (change of) liver, not elsewhere classified: Secondary | ICD-10-CM | POA: Insufficient documentation

## 2018-07-05 DIAGNOSIS — R1012 Left upper quadrant pain: Secondary | ICD-10-CM

## 2018-07-19 NOTE — Progress Notes (Signed)
MEDICARE ANNUAL WELLNESS VISIT AND FOLLOW UP  Assessment:    Encounter for Medicare annual wellness exam Get MGM and DEXA 1 year  Essential hypertension - continue medications, DASH diet, exercise and monitor at home. Call if greater than 130/80.  -     CBC with Differential/Platelet -     COMPLETE METABOLIC PANEL WITH GFR -     TSH  Diabetic polyneuropathy associated with diabetes mellitus due to underlying condition [facility]) Discussed general issues about diabetes pathophysiology and management., Educational material distributed., Suggested low cholesterol diet., Encouraged aerobic exercise., Discussed foot care., Reminded to get yearly retinal exam. -     Hemoglobin A1c -     VAS [place] LOWER EXTREMITY ARTERIAL DUPLEX; Future SUGGEST PHYSICAL THERAPY IN A POOL, INTERESTED NEXT OV  Type 2 diabetes mellitus with stage 2 chronic kidney disease, without long-term current use of insulin ([place]) Discussed general issues about diabetes pathophysiology and management., Educational material distributed., Suggested low cholesterol diet., Encouraged aerobic exercise., Discussed foot care., Reminded to get yearly retinal exam. -     Hemoglobin A1c  Mixed hyperlipidemia check lipids decrease fatty foods increase activity.  -     Lipid panel  Medication management -     Magnesium  Fatty liver Weight loss advised, avoid alcohol/tylenol, will monitor LFTs  Foot drop, bilateral Discussed PT in a pool interested after the holidays  Vitamin D deficiency Continue supplement  Macular degeneration, unspecified laterality, unspecified type She does not drive due to this Continue follow up Dr. [name] Will request exam  Decreased pulses in feet Rubor bilateral feet, patient inactive If positive discussed starting crestor and plavix- declines starting at this time Patient not able to take asa due to [facility] degen May need referral to vascular -     VAS [place] ABI WITH/WO TBI; Future -     VAS [place]  LOWER EXTREMITY ARTERIAL DUPLEX; Future  Estrogen deficiency -     DG Bone Density; Future  Recurrent UTI -     Urine Culture  Cholelithiasis Declines work up at this time Knows Er precautions, will avoid fatty foods.   Over 30 minutes of exam, counseling, chart review, and critical decision making was performed  Future Appointments  Date Time Provider [place]  07/20/2018  2:30 PM [facility]ASC 1 [facility]  07/21/2018  9:30 AM [facility] VASC 3 [facility]  10/26/2018  9:30 AM Unk Pinto, MD [facility] None  01/17/2019  4:00 PM Kathrynn Ducking, MD [facility] None  05/03/2019  9:00 AM Unk Pinto, MD [facility] None    Plan:   During the course of the visit the patient was educated and counseled about appropriate screening and preventive services including:    Pneumococcal vaccine   Influenza vaccine  Td vaccine  Prevnar 13  Screening electrocardiogram  Screening mammography  Bone densitometry screening  Colorectal cancer screening  Diabetes screening  Glaucoma screening  Nutrition counseling   Advanced directives: given info/requested copies   Subjective:   Grace [name] is a 67 y.o. female who presents for Medicare Annual Wellness Visit and 3 month follow up on hypertension, diabetes, hyperlipidemia, vitamin D def.   She has history of recurrent UTI, recently treated, will recheck urine today.   She has redness in her left foot, wears braces. No cramping in her legs but she is very inactive. She had an ABI in 11/2013 with mild disease. She is unable to take an ASA due to her mac degen.  She had an AB [place] Jul 05 2018 that showed: IMPRESSION: 1. Combination of sludge and gallstones within the gallbladder. No gallbladder wall thickening or pericholecystic fluid.  2. Increase in liver echogenicity consistent with hepatic steatosis. While no focal liver lesions are evident on this study, it must be cautioned that the  sensitivity of ultrasound for detection of focal liver lesions is diminished in this circumstance.   Her blood pressure is not checked at home, today their BP is BP: 140/70 She does not workout. She denies chest pain, shortness of breath, dizziness.  She is not on cholesterol medication and denies myalgias. Her cholesterol is not at goal. The cholesterol last visit was:   Lab Results  Component Value Date   CHOL 226 (H) 03/11/2018   HDL 55 03/11/2018   LDLCALC 133 (H) 03/11/2018   TRIG 236 (H) 03/11/2018   CHOLHDL 4.1 03/11/2018   She does have a history of diabetes managed with medication, diet, and exercise.  Her sugars have been 130's in the AM, she is on 4 metformin a day and 1 amaryl a day with largest meal, no low blood sugar.  She also has neuropathy which is extensive and requires her to use a walker to avoid falls, she is on cymbalta for neuropathy. She has braces on her legs to help prevent falls, no falls in past year but she is high risk.    Lab Results  Component Value Date   HGBA1C 7.4 (H) 03/11/2018   Last GFR Lab Results  Component Value Date   GFRNONAA 66 06/07/2018   Lab Results  Component Value Date   GFRAA 76 06/07/2018   Patient is on Vitamin D supplement. Lab Results  Component Value Date   VD25OH 49 03/11/2018     She notes that she has been following with Dr. [name] for macular degeneration and macular edema.  He did have [place] start HCTZ for this purpose.     Medication Review Current Outpatient Medications on File Prior to Visit  Medication Sig Dispense Refill  . Calcium Citrate-Vitamin D (CITRACAL/VITAMIN D PO) Take 1 tablet by mouth daily.    . Cholecalciferol (VITAMIN D PO) Take 1,000 Units by mouth 2 (two) times daily. Reported on 02/07/2016    . DULoxetine (CYMBALTA) 30 MG capsule TAKE 1 CAPSULE BY MOUTH EVERY DAY 90 capsule 3  . glimepiride (AMARYL) 2 MG tablet TAKE 1/2 TO 1 TABLET BY MOUTH DAILY FOR DIABETES 90 tablet 3  . Magnesium 250 MG  TABS Take 2 tablets by mouth daily.     . metFORMIN (GLUCOPHAGE-XR) 500 MG 24 hr tablet TAKE 1 TO 2 TABLETS TWICE DAILY WITH FOOD AS DIRECTED FOR DIABETES 360 tablet 1   No current facility-administered medications on file prior to visit.     Allergies: Allergies  Allergen Reactions  . Gabapentin Swelling    Swelling in legs and feet    Current Problems (verified) has Diabetic polyneuropathy ([place]); Foot drop, bilateral; T2_NIDDM; Mixed hyperlipidemia; Vitamin D deficiency; Medication management; Essential hypertension; Macular degeneration; Fatty liver; and Cholelithiases on their problem list.  Screening Tests Immunization History  Administered Date(s) Administered  . Influenza, High Dose Seasonal PF 05/12/2016, 05/24/2018  . Influenza-Unspecified 07/03/2017  . Pneumococcal Conjugate-13 02/07/2016  . Pneumococcal Polysaccharide-23 09/03/2017  . Td 02/07/2016    Preventative care: Cologuard 04/2018 Last mammogram: 04/2016 DUE Last pap smear/pelvic exam: s/p TAH   DEXA: Ordered with MGM CXr 2012  Prior vaccinations: TD or Tdap: 2017 Influenza: 2015  Pneumococcal: 2019 Prevnar13: 2017 Shingles/Zostavax: 2012  Names of Other Physician/Practitioners you currently use: 1. [facility]- here for primary care 2. Dr. [name], eye doctor, last visit 06/2018 Dr. Zenaida Niece, dentist, last visit 2017 Patient Care Team: Unk Pinto, MD as PCP - General (Internal Medicine) [name] Clent Demark, MD as Consulting Physician (Ophthalmology) Kathrynn Ducking, MD as Consulting Physician (Neurology) Monna Fam, MD as Consulting Physician (Ophthalmology) Earnstine Regal, PA-C as Physician Assistant (Obstetrics and Gynecology)  Surgical: She  has a past surgical history that includes Tonsillectomy; Abdominal hysterectomy; Hip surgery; Ankle fracture surgery; and Cataract extraction, bilateral. Family Her family history includes Diabetes in her  brother and mother. Social history  She reports that she has never smoked. She has never used smokeless tobacco. She reports that she does not drink alcohol or use drugs.  MEDICARE WELLNESS OBJECTIVES: Physical activity: Current Exercise Habits: The patient does not participate in regular exercise at present, Exercise limited by: orthopedic condition(s) Cardiac risk factors: Cardiac Risk Factors include: advanced age (>84mn, >>5women);diabetes mellitus;dyslipidemia;family history of premature cardiovascular disease;hypertension;obesity (BMI >30kg/m2);sedentary lifestyle Depression/mood screen:   [facility] 07/20/2018  Decreased Interest 0  Down, Depressed, Hopeless 0  PHQ - 2 Score 0  Altered sleeping -  Tired, decreased energy -  Change in appetite -  Feeling bad or failure about yourself  -  Trouble concentrating -  Moving slowly or fidgety/restless -  Suicidal thoughts -  PHQ-9 Score -  Difficult doing work/chores -    ADLs:  In your present state of health, do you have any difficulty performing the following activities: 07/20/2018 03/14/2018  Hearing? N N  Vision? Y N  Difficulty concentrating or making decisions? N N  Walking or climbing stairs? Y Y  Comment - in braces for bilat foot drop  Dressing or bathing? N N  Doing errands, shopping? Y N  Some recent data might be hidden     Cognitive Testing  Alert? Yes  Normal Appearance?Yes  Oriented to person? Yes  Place? Yes   Time? Yes  Recall of three objects?  Yes  Can perform simple calculations? Yes  Displays appropriate judgment?Yes  Can read the correct time from a watch face?Yes  EOL planning: Does Patient Have a Medical Advance Directive?: No Would patient like information on creating a medical advance directive?: Yes (MAU/Ambulatory/Procedural Areas - Information given)   Objective:   Today's Vitals   07/20/18 0856  BP: 140/70  Pulse: 74  Temp: 97.8 F (36.6 C)  SpO2: 96%  Weight: 172 lb  6.4 oz (78.2 kg)  Height: _0  (1.651 m)   Body mass index is 28.69 kg/m.  General appearance: alert, no distress, WD/WN,  female HEENT: normocephalic, sclerae anicteric, TMs pearly, nares patent, no discharge or erythema, pharynx normal Oral cavity: MMM, no lesions Neck: supple, no lymphadenopathy, no thyromegaly, no masses Heart: RRR, normal S1, S2, no murmurs Lungs: CTA bilaterally, no wheezes, rhonchi, or rales Abdomen: +bs, soft, non tender, non distended, no masses, no hepatomegaly, no splenomegaly Musculoskeletal: nontender, no swelling, no obvious deformity Extremities: mild edema, no cyanosis, no clubbing, dependent rubor Pulses: 2+ symmetric upper pulses, bilateral TP 1+ and DP absent, decrease cap refill, left toe with blood blister.  Neurological: alert, oriented x 3, CN2-12 intact, strength normal upper extremities and lower extremities, sensation decreased to knee bilaterally, gait antalgic, absent ankle reflex bilaterally.  Psychiatric: normal affect, behavior normal, pleasant  Breast: defer Gyn: defer Rectal: defer  Medicare Attestation I have personally reviewed: The patient's medical and social history Their use of alcohol, tobacco or illicit drugs Their current medications and supplements The patient's functional ability including ADLs,fall risks, home safety risks, cognitive, and hearing and visual impairment Diet and physical activities Evidence for depression or mood disorders  The patient's weight, height, BMI, and visual acuity have been recorded in the chart.  I have made referrals, counseling, and provided education to the patient based on review of the above and I have provided the patient with a written personalized care plan for preventive services.     Vicie Mutters, PA-C   07/20/2018

## 2018-07-20 ENCOUNTER — Ambulatory Visit (INDEPENDENT_AMBULATORY_CARE_PROVIDER_SITE_OTHER): Payer: Medicare Other | Admitting: Physician Assistant

## 2018-07-20 ENCOUNTER — Ambulatory Visit (HOSPITAL_COMMUNITY)
Admission: RE | Admit: 2018-07-20 | Discharge: 2018-07-20 | Disposition: A | Discharge status: Home / Self Care | Payer: Medicare Other | Source: Ambulatory Visit | Attending: Cardiovascular Disease | Admitting: Cardiovascular Disease

## 2018-07-20 ENCOUNTER — Encounter: Payer: Self-pay | Admitting: Physician Assistant

## 2018-07-20 VITALS — BP 140/70 | HR 74 | Temp 97.8°F | Ht 65.0 in | Wt 172.4 lb

## 2018-07-20 DIAGNOSIS — K760 Fatty (change of) liver, not elsewhere classified: Secondary | ICD-10-CM | POA: Diagnosis not present

## 2018-07-20 DIAGNOSIS — K8020 Calculus of gallbladder without cholecystitis without obstruction: Secondary | ICD-10-CM | POA: Diagnosis not present

## 2018-07-20 DIAGNOSIS — M21372 Foot drop, left foot: Secondary | ICD-10-CM

## 2018-07-20 DIAGNOSIS — Z0001 Encounter for general adult medical examination with abnormal findings: Secondary | ICD-10-CM | POA: Diagnosis not present

## 2018-07-20 DIAGNOSIS — M21371 Foot drop, right foot: Secondary | ICD-10-CM

## 2018-07-20 DIAGNOSIS — R0989 Other specified symptoms and signs involving the circulatory and respiratory systems: Secondary | ICD-10-CM | POA: Diagnosis not present

## 2018-07-20 DIAGNOSIS — E0842 Diabetes mellitus due to underlying condition with diabetic polyneuropathy: Secondary | ICD-10-CM

## 2018-07-20 DIAGNOSIS — Z0000 Encounter for general adult medical examination without abnormal findings: Secondary | ICD-10-CM

## 2018-07-20 DIAGNOSIS — I10 Essential (primary) hypertension: Secondary | ICD-10-CM

## 2018-07-20 DIAGNOSIS — E782 Mixed hyperlipidemia: Secondary | ICD-10-CM

## 2018-07-20 DIAGNOSIS — N390 Urinary tract infection, site not specified: Secondary | ICD-10-CM

## 2018-07-20 DIAGNOSIS — E2839 Other primary ovarian failure: Secondary | ICD-10-CM | POA: Diagnosis not present

## 2018-07-20 DIAGNOSIS — E559 Vitamin D deficiency, unspecified: Secondary | ICD-10-CM | POA: Diagnosis not present

## 2018-07-20 DIAGNOSIS — R6889 Other general symptoms and signs: Secondary | ICD-10-CM | POA: Diagnosis not present

## 2018-07-20 DIAGNOSIS — Z79899 Other long term (current) drug therapy: Secondary | ICD-10-CM

## 2018-07-20 DIAGNOSIS — N182 Chronic kidney disease, stage 2 (mild): Secondary | ICD-10-CM | POA: Diagnosis not present

## 2018-07-20 DIAGNOSIS — H3530 Unspecified macular degeneration: Secondary | ICD-10-CM | POA: Diagnosis not present

## 2018-07-20 DIAGNOSIS — E1122 Type 2 diabetes mellitus with diabetic chronic kidney disease: Secondary | ICD-10-CM

## 2018-07-20 NOTE — Patient Instructions (Addendum)
HOW TO SCHEDULE A MAMMOGRAM  [facility]  7 a.m.-6:30 p.m., Monday 7 a.m.-5 p.m., Tuesday-Friday Schedule an appointment by calling [phone].  Will get bone density with the Mammogram  Can try cinnamon 1000mg  twice a day and stop the glimeperide   Statin therapy  In addition to the known lipid-lowering effects, statins are now widely accepted to have anti-inflammatory and immunomodulatory effects. Adjunctive use of statins has proven beneficial in the context of a wide range of inflammatory diseases, including rheumatoid arthritis.  Research has shown that the salutary effect of statins on cholesterol may not be their only benefit. Statin therapy has shown promise for everything from fighting viral infections to protecting the eye from cataracts.  A 2005 study of more than 700 hospital patients being treated for pneumonia found that the death rate was more than twice as high among those who were not using statins. In 2006, a [place] study examined the rate of sepsis, a deadly blood infection, among patients who had been hospitalized for heart events. In the two years after their hospitalization, the statin users had a rate of sepsis 19% lower than that of the non-statin users. A 2009 review of 22 studies found that statins appeared to have a beneficial effect on the outcome of infection, but they couldn't come to a firm conclusion.     When it comes to diets, agreement about the perfect plan isn't easy to find, even among the experts. Experts at the [employer] developed an idea known as the Healthy Eating Plate. Just imagine a plate divided into logical, healthy portions.  The emphasis is on diet quality:  Load up on vegetables and fruits - one-half of your plate: Aim for color and variety, and remember that potatoes don't count.  Go for whole grains - one-quarter of your plate: Whole wheat, barley, wheat berries, quinoa, oats,  brown rice, and foods made with them. If you want pasta, go with whole wheat pasta.  Protein power - one-quarter of your plate: Fish, chicken, beans, and nuts are all healthy, versatile protein sources. Limit red meat.  The diet, however, does go beyond the plate, offering a few other suggestions.  Use healthy plant oils, such as olive, canola, soy, corn, sunflower and peanut. Check the labels, and avoid partially hydrogenated oil, which have unhealthy trans fats.  If you're thirsty, drink water. Coffee and tea are good in moderation, but skip sugary drinks and limit milk and dairy products to one or two daily servings.  The type of carbohydrate in the diet is more important than the amount. Some sources of carbohydrates, such as vegetables, fruits, whole grains, and beans-are healthier than others.  Finally, stay active.   Google mindful eating and here are some tips and tricks below.   Rate your hunger before you eat on a scale of 1-10, try to eat closer to a 6 or higher. And if you are at below that, why are you eating? Slow down and listen to your body.        Cholelithiasis Cholelithiasis is a form of gallbladder disease in which gallstones form in the gallbladder. The gallbladder is an organ that stores bile. Bile is made in the liver, and it helps to digest fats. Gallstones begin as small crystals and slowly grow into stones. They may cause no symptoms until the gallbladder tightens (contracts) and a gallstone is blocking the duct (gallbladder attack), which can cause pain. Cholelithiasis is also  referred to as gallstones. There are two main types of gallstones:  Cholesterol stones. These are made of hardened cholesterol and are usually yellow-green in color. They are the most common type of gallstone. Cholesterol is a white, waxy, fat-like substance that is made in the liver.  Pigment stones. These are dark in color and are made of a red-yellow substance that forms when  hemoglobin from red blood cells breaks down (bilirubin).  What are the causes? This condition may be caused by an imbalance in the substances that bile is made of. This can happen if the bile:  Has too much bilirubin.  Has too much cholesterol.  Does not have enough bile salts. These salts help the body absorb and digest fats.  In some cases, this condition can also be caused by the gallbladder not emptying completely or often enough. What increases the risk? The following factors may make you more likely to develop this condition:  Being female.  Having multiple pregnancies. Health care providers sometimes advise removing diseased gallbladders before future pregnancies.  Eating a diet that is heavy in fried foods, fat, and refined carbohydrates, like white bread and white rice.  Being obese.  Being older than age 31.  Prolonged use of medicines that contain female hormones (estrogen).  Having diabetes mellitus.  Rapidly losing weight.  Having a family history of gallstones.  Being of [place] or [place] descent.  Having an intestinal disease such as Crohn disease.  Having metabolic syndrome.  Having cirrhosis.  Having severe types of anemia such as sickle cell anemia.  What are the signs or symptoms? In most cases, there are no symptoms. These are known as silent gallstones. If a gallstone blocks the bile ducts, it can cause a gallbladder attack. The main symptom of a gallbladder attack is sudden pain in the upper right abdomen. The pain usually comes at night or after eating a large meal. The pain can last for one or several hours and can spread to the right shoulder or chest. If the bile duct is blocked for more than a few hours, it can cause infection or inflammation of the gallbladder, liver, or pancreas, which may cause:  Nausea.  Vomiting.  Abdominal pain that lasts for 5 hours or more.  Fever or chills.  Yellowing of the skin or the whites of the  eyes (jaundice).  Dark urine.  Light-colored stools.  How is this diagnosed? This condition may be diagnosed based on:  A physical exam.  Your medical history.  An ultrasound of your gallbladder.  CT scan.  MRI.  Blood tests to check for signs of infection or inflammation.  A scan of your gallbladder and bile ducts (biliary system) using nonharmful radioactive material and special cameras that can see the radioactive material (cholescintigram). This test checks to see how your gallbladder contracts and whether bile ducts are blocked.  Inserting a small tube with a camera on the end (endoscope) through your mouth to inspect bile ducts and check for blockages (endoscopic retrograde cholangiopancreatogram).  How is this treated? Treatment for gallstones depends on the severity of the condition. Silent gallstones do not need treatment. If the gallstones cause a gallbladder attack or other symptoms, treatment may be required. Options for treatment include:  Surgery to remove the gallbladder (cholecystectomy). This is the most common treatment.  Medicines to dissolve gallstones. These are most effective at treating small gallstones. You may need to take medicines for up to 6-12 months.  Shock wave treatment (extracorporeal  biliary lithotripsy). In this treatment, an ultrasound machine sends shock waves to the gallbladder to break gallstones into smaller pieces. These pieces can then be passed into the intestines or be dissolved by medicine. This is rarely used.  Removing gallstones through endoscopic retrograde cholangiopancreatogram. A small basket can be attached to the endoscope and used to capture and remove gallstones.  Follow these instructions at home:  Take over-the-counter and prescription medicines only as told by your health care provider.  Maintain a healthy weight and follow a healthy diet. This includes: ? Reducing fatty foods, such as fried food. ? Reducing refined  carbohydrates, like white bread and white rice. ? Increasing fiber. Aim for foods like almonds, fruit, and beans.  Keep all follow-up visits as told by your health care provider. This is important. Contact a health care provider if:  You think you have had a gallbladder attack.  You have been diagnosed with silent gallstones and you develop abdominal pain or indigestion. Get help right away if:  You have pain from a gallbladder attack that lasts for more than 2 hours.  You have abdominal pain that lasts for more than 5 hours.  You have a fever or chills.  You have persistent nausea and vomiting.  You develop jaundice.  You have dark urine or light-colored stools. Summary  Cholelithiasis (also called gallstones) is a form of gallbladder disease in which gallstones form in the gallbladder.  This condition is caused by an imbalance in the substances that make up bile. This can happen if the bile has too much cholesterol, too much bilirubin, or not enough bile salts.  You are more likely to develop this condition if you are female, pregnant, using medicines with estrogen, obese, older than age 17, or have a family history of gallstones. You may also develop gallstones if you have diabetes, an intestinal disease, cirrhosis, or metabolic syndrome.  Treatment for gallstones depends on the severity of the condition. Silent gallstones do not need treatment.  If gallstones cause a gallbladder attack or other symptoms, treatment may be needed. The most common treatment is surgery to remove the gallbladder. This information is not intended to replace advice given to you by your health care provider. Make sure you discuss any questions you have with your health care provider. Document Released: 07/31/2005 Document Revised: 04/20/2016 Document Reviewed: 04/20/2016 Elsevier Interactive Patient Education  2018 [place].    Fatty liver or Nonalcoholic fatty liver disease (NASH) is now the  leading cause of liver failure in the [place]. It is normally from such risk factors as obesity, diabetes, insulin resistance, high cholesterol, or metabolic syndrome. The only definitive therapy is weight loss and exercise.  Suggest walking 20-30 mins daily.  Decreasing carbohydrates, increasing veggies.  Vitamin E 800 IU a day may be beneficial. Liver cancer has been noted in patient with fatty liver without cirrhosis.  Will monitor closely   Fatty Liver Fatty liver is the accumulation of fat in liver cells. It is also called hepatosteatosis or steatohepatitis. It is normal for your liver to contain some fat. If fat is more than 5 to 10% of your liver's weight, you have fatty liver.  There are often no symptoms (problems) for years while damage is still occurring. People often learn about their fatty liver when they have medical tests for other reasons. Fat can damage your liver for years or even decades without causing problems. When it becomes severe, it can cause fatigue, weight loss, weakness, and confusion.  This makes you more likely to develop more serious liver problems. The liver is the largest organ in the body. It does a lot of work and often gives no warning signs when it is sick until late in a disease. The liver has many important jobs including:  Breaking down foods.  Storing vitamins, iron, and other minerals.  Making proteins.  Making bile for food digestion.  Breaking down many products including medications, alcohol and some poisons.  PROGNOSIS  Fatty liver may cause no damage or it can lead to an inflammation of the liver. This is, called steatohepatitis.  Over time the liver may become scarred and hardened. This condition is called cirrhosis. Cirrhosis is serious and may lead to liver failure or cancer. NASH is one of the leading causes of cirrhosis. About 10-20% of Americans have fatty liver and a smaller 2-5% has NASH.  TREATMENT   Weight loss, fat  restriction, and exercise in overweight patients produces inconsistent results but is worth trying.  Good control of diabetes may reduce fatty liver.  Eat a balanced, healthy diet.  Increase your physical activity.  There are no medical or surgical treatments for a fatty liver or NASH, but improving your diet and increasing your exercise may help prevent or reverse some of the damage.

## 2018-07-21 ENCOUNTER — Encounter (HOSPITAL_COMMUNITY): Payer: Medicare Other

## 2018-07-21 LAB — URINALYSIS, ROUTINE W REFLEX MICROSCOPIC
Bacteria, UA: NONE SEEN /HPF
Bilirubin Urine: NEGATIVE
Glucose, UA: NEGATIVE
Hgb urine dipstick: NEGATIVE
Hyaline Cast: NONE SEEN /LPF
Ketones, ur: NEGATIVE
Nitrite: NEGATIVE
Protein, ur: NEGATIVE
Specific Gravity, Urine: 1.02 (ref 1.001–1.03)
pH: <=5 (ref 5.0–8.0)

## 2018-07-21 LAB — HEMOGLOBIN A1C
Hgb A1c MFr Bld: 7.7 % of total Hgb — ABNORMAL HIGH (ref <5.7)
Mean Plasma Glucose: 174 (calc)
eAG (mmol/L): 9.7 (calc)

## 2018-07-21 LAB — CBC WITH DIFFERENTIAL/PLATELET
Basophils Absolute: 72 cells/uL (ref 0–200)
Basophils Relative: 1 %
Eosinophils Absolute: 288 cells/uL (ref 15–500)
Eosinophils Relative: 4 %
HCT: 36 % (ref 35.0–45.0)
Hemoglobin: 11.8 g/dL (ref 11.7–15.5)
Lymphs Abs: 1951 cells/uL (ref 850–3900)
MCH: 30.3 pg (ref 27.0–33.0)
MCHC: 32.8 g/dL (ref 32.0–36.0)
MCV: 92.5 fL (ref 80.0–100.0)
MPV: 9.4 fL (ref 7.5–12.5)
Monocytes Relative: 7.3 %
Neutro Abs: 4363 cells/uL (ref 1500–7800)
Neutrophils Relative %: 60.6 %
Platelets: 390 10*3/uL (ref 140–400)
RBC: 3.89 10*6/uL (ref 3.80–5.10)
RDW: 13.3 % (ref 11.0–15.0)
Total Lymphocyte: 27.1 %
WBC mixed population: 526 cells/uL (ref 200–950)
WBC: 7.2 10*3/uL (ref 3.8–10.8)

## 2018-07-21 LAB — COMPLETE METABOLIC PANEL WITH GFR
AG Ratio: 1.7 (calc) (ref 1.0–2.5)
ALT: 18 U/L (ref 6–29)
AST: 17 U/L (ref 10–35)
Albumin: 3.9 g/dL (ref 3.6–5.1)
Alkaline phosphatase (APISO): 59 U/L (ref 33–130)
BUN: 17 mg/dL (ref 7–25)
CO2: 31 mmol/L (ref 20–32)
Calcium: 10.1 mg/dL (ref 8.6–10.4)
Chloride: 105 mmol/L (ref 98–110)
Creat: 0.95 mg/dL (ref 0.50–0.99)
GFR, Est African American: 71 mL/min/{1.73_m2} (ref >=60)
GFR, Est Non African American: 62 mL/min/{1.73_m2} (ref >=60)
Globulin: 2.3 g/dL (calc) (ref 1.9–3.7)
Glucose, Bld: 160 mg/dL — ABNORMAL HIGH (ref 65–99)
Potassium: 6 mmol/L — ABNORMAL HIGH (ref 3.5–5.3)
Sodium: 144 mmol/L (ref 135–146)
Total Bilirubin: 0.3 mg/dL (ref 0.2–1.2)
Total Protein: 6.2 g/dL (ref 6.1–8.1)

## 2018-07-21 LAB — URINE CULTURE
MICRO NUMBER:: 91447991
SPECIMEN QUALITY:: ADEQUATE

## 2018-07-21 LAB — TSH: TSH: 1.4 mIU/L (ref 0.40–4.50)

## 2018-07-21 LAB — LIPID PANEL
Cholesterol: 213 mg/dL — ABNORMAL HIGH (ref <200)
HDL: 59 mg/dL (ref >50)
LDL Cholesterol (Calc): 123 mg/dL (calc) — ABNORMAL HIGH
Non-HDL Cholesterol (Calc): 154 mg/dL (calc) — ABNORMAL HIGH (ref <130)
Total CHOL/HDL Ratio: 3.6 (calc) (ref <5.0)
Triglycerides: 193 mg/dL — ABNORMAL HIGH (ref <150)

## 2018-07-21 LAB — MAGNESIUM: Magnesium: 1.3 mg/dL — ABNORMAL LOW (ref 1.5–2.5)

## 2018-07-26 ENCOUNTER — Encounter (HOSPITAL_COMMUNITY): Payer: Medicare Other

## 2018-07-26 DIAGNOSIS — H359 Unspecified retinal disorder: Secondary | ICD-10-CM | POA: Diagnosis not present

## 2018-07-26 DIAGNOSIS — E113512 Type 2 diabetes mellitus with proliferative diabetic retinopathy with macular edema, left eye: Secondary | ICD-10-CM | POA: Diagnosis not present

## 2018-07-26 DIAGNOSIS — Z961 Presence of intraocular lens: Secondary | ICD-10-CM | POA: Diagnosis not present

## 2018-07-26 DIAGNOSIS — H35371 Puckering of macula, right eye: Secondary | ICD-10-CM | POA: Diagnosis not present

## 2018-07-26 DIAGNOSIS — E113511 Type 2 diabetes mellitus with proliferative diabetic retinopathy with macular edema, right eye: Secondary | ICD-10-CM | POA: Diagnosis not present

## 2018-08-03 DIAGNOSIS — H35372 Puckering of macula, left eye: Secondary | ICD-10-CM | POA: Diagnosis not present

## 2018-08-03 DIAGNOSIS — E113512 Type 2 diabetes mellitus with proliferative diabetic retinopathy with macular edema, left eye: Secondary | ICD-10-CM | POA: Diagnosis not present

## 2018-08-03 DIAGNOSIS — E113511 Type 2 diabetes mellitus with proliferative diabetic retinopathy with macular edema, right eye: Secondary | ICD-10-CM | POA: Diagnosis not present

## 2018-09-08 ENCOUNTER — Other Ambulatory Visit: Payer: Self-pay | Admitting: Internal Medicine

## 2018-09-08 DIAGNOSIS — Z1231 Encounter for screening mammogram for malignant neoplasm of breast: Secondary | ICD-10-CM

## 2018-09-20 ENCOUNTER — Encounter: Payer: Self-pay | Admitting: Adult Health

## 2018-09-20 ENCOUNTER — Ambulatory Visit (INDEPENDENT_AMBULATORY_CARE_PROVIDER_SITE_OTHER): Payer: Medicare Other | Admitting: Adult Health

## 2018-09-20 VITALS — BP 142/78 | HR 80 | Temp 97.9°F | Ht 65.0 in | Wt 173.8 lb

## 2018-09-20 DIAGNOSIS — J0101 Acute recurrent maxillary sinusitis: Secondary | ICD-10-CM

## 2018-09-20 MED ORDER — PROMETHAZINE-DM 6.25-15 MG/5ML PO SYRP: 5.0000 mL | ORAL_SOLUTION | Freq: Four times a day (QID) | ORAL | 1 refills | Status: DC | PRN

## 2018-09-20 MED ORDER — AZITHROMYCIN 250 MG PO TABS: ORAL_TABLET | ORAL | 1 refills | Status: AC

## 2018-09-20 MED ORDER — PREDNISONE 20 MG PO TABS: ORAL_TABLET | ORAL | 0 refills | Status: DC

## 2018-09-20 NOTE — Progress Notes (Signed)
Assessment and Plan:  Joye was seen today for acute visit.  Diagnoses and all orders for this visit:  Acute recurrent maxillary sinusitis Suggested symptomatic OTC remedies. Nasal saline spray for congestion, mucinex Nasal steroids, allergy pill, oral steroids offered Follow up as needed. -     azithromycin (ZITHROMAX) 250 MG tablet; Take 2 tablets (500 mg) on  Day 1,  followed by 1 tablet (250 mg) once daily on Days 2 through 5. -     predniSONE (DELTASONE) 20 MG tablet; 2 tablets daily for 3 days, 1 tablet daily for 4 days. -     promethazine-dextromethorphan (PROMETHAZINE-DM) 6.25-15 MG/5ML syrup; Take 5 mLs by mouth 4 (four) times daily as needed for cough.  Further disposition pending results of labs. Discussed med's effects and SE's.   Over 15 minutes of exam, counseling, chart review, and critical decision making was performed.   Future Appointments  Date Time Provider [place]  10/26/2018  9:30 AM Unk Pinto, MD [facility] None  11/04/2018 12:30 PM [facility] MM 3 [facility]  11/04/2018  1:00 PM [facility] DX DEXA 1 [facility]  01/17/2019  4:00 PM Kathrynn Ducking, MD [facility] None  05/03/2019  9:00 AM Unk Pinto, MD [facility] None  07/29/2019  9:30 AM Vicie Mutters, PA-C [facility] None    ------------------------------------------------------------------------------------------------------------------   HPI BP (!) 142/78   Pulse 80   Temp 97.9 F (36.6 C)   Ht 5\' 5"  (1.651 m)   Wt 173 lb 12.8 oz (78.8 kg)   SpO2 99%   BMI 28.92 kg/m   68 y.o.female with hx of T2DM presents for evaluation of 14 days of URI symptoms; she reports began after she kept ill grandson; began as head cold - facial pressure, headache, teeth aching, neck tenderness, ears are full and intermittently painful, worse on the left. She reports intermittent chills but hasn't checked temp. She endorses scratchy sore throat, and a mild intermittently productive  cough. She reports she has improved some but then would relapse with worse symptoms. She reports upper jaw aching.   She denies allergies. She has been using unknown OTC cold/flu medication whichout much improvement. She did have flu vaccine this season.   Past Medical History:  Diagnosis Date  . Abnormality of gait 07/28/2013  . Diabetes ([place])   . Diabetic peripheral neuropathy ([place])   . Diabetic retinopathy ([place])   . Foot drop, bilateral 07/31/2014  . Peripheral edema   . Polyneuropathy in diabetes(357.2) 07/28/2013     Allergies  Allergen Reactions  . Gabapentin Swelling    Swelling in legs and feet    Current Outpatient Medications on File Prior to Visit  Medication Sig  . Calcium Citrate-Vitamin D (CITRACAL/VITAMIN D PO) Take 1 tablet by mouth daily.  . Cholecalciferol (VITAMIN D PO) Take 1,000 Units by mouth 2 (two) times daily. Reported on 02/07/2016  . DULoxetine (CYMBALTA) 30 MG capsule TAKE 1 CAPSULE BY MOUTH EVERY DAY  . glimepiride (AMARYL) 2 MG tablet TAKE 1/2 TO 1 TABLET BY MOUTH DAILY FOR DIABETES  . Magnesium 250 MG TABS Take 2 tablets by mouth daily.   . metFORMIN (GLUCOPHAGE-XR) 500 MG 24 hr tablet TAKE 1 TO 2 TABLETS TWICE DAILY WITH FOOD AS DIRECTED FOR DIABETES   No current facility-administered medications on file prior to visit.     ROS: all negative except above.   Physical Exam:  BP (!) 142/78   Pulse 80   Temp 97.9 F (36.6 C)  Ht 5\' 5"  (1.651 m)   Wt 173 lb 12.8 oz (78.8 kg)   SpO2 99%   BMI 28.92 kg/m   General Appearance: Well nourished, in no apparent distress. Eyes: PERRLA, conjunctiva no swelling or erythema Sinuses: No R sided Frontal/maxillary tenderness but has L sided sinus tenderness, worse in maxillary region  ENT/Mouth: Ext aud canals clear, TMs without erythema, bulging. No erythema, swelling, or exudate on post pharynx.  Tonsils not swollen or erythematous. Hearing normal.  Neck: Supple, thyroid normal.  Respiratory:  Respiratory effort normal, BS equal bilaterally without rales, rhonchi, wheezing or stridor.  Cardio: RRR with no MRGs. Brisk peripheral pulses without edema.  Abdomen: Soft, + BS.  Non tender, no guarding, rebound, hernias, masses. Lymphatics: Upper neck mildly tender, worse on L, without lymphadenopathy.  Musculoskeletal: normal gait.  Skin: Warm, dry without rashes, lesions, ecchymosis.  Neuro: Cranial nerves intact. Normal muscle tone. Psych: Awake and oriented X 3, normal affect, Insight and Judgment appropriate.     Izora Ribas, NP 4:48 PM [facility]

## 2018-09-20 NOTE — Patient Instructions (Signed)
flonase (fluticasone) nasal spray over the counter - 1 spray in each nostril once or twice daily   Mucinex/guaifenasin - helps dry secretions    Medicines you can use  Nasal congestion  Little Remedies saline spray (aerosol/mist)- can try this, it is in the kids section - pseudoephedrine (Sudafed)- behind the counter, do not use if you have high blood pressure, medicine that have -D in them.  - phenylephrine (Sudafed PE) -Dextormethorphan + chlorpheniramine (Coridcidin HBP)- okay if you have high blood pressure -Oxymetazoline (Afrin) nasal spray- LIMIT to 3 days -Saline nasal spray -Neti pot (used distilled or bottled water)  Ear pain/congestion  -pseudoephedrine (sudafed) - Nasonex/flonase nasal spray  Fever  -Acetaminophen (Tyelnol) -Ibuprofen (Advil, motrin, aleve)  Sore Throat  -Acetaminophen (Tyelnol) -Ibuprofen (Advil, motrin, aleve) -Drink a lot of water -Gargle with salt water - Rest your voice (don't talk) -Throat sprays -Cough drops  Body Aches  -Acetaminophen (Tyelnol) -Ibuprofen (Advil, motrin, aleve)  Headache  -Acetaminophen (Tyelnol) -Ibuprofen (Advil, motrin, aleve) - Exedrin, Exedrin Migraine  Allergy symptoms (cough, sneeze, runny nose, itchy eyes) -Claritin or loratadine cheapest but likely the weakest  -Zyrtec or certizine at night because it can make you sleepy -The strongest is allegra or fexafinadine  Cheapest at [employer], [employer], [employer]  Cough  -Dextromethorphan (Delsym)- medicine that has DM in it -Guafenesin (Mucinex/Robitussin) - cough drops - drink lots of water  Chest Congestion  -Guafenesin (Mucinex/Robitussin)  Red Itchy Eyes  - Naphcon-A  Upset Stomach  - Bland diet (nothing spicy, greasy, fried, and high acid foods like tomatoes, oranges, berries) -OKAY- cereal, bread, soup, crackers, rice -Eat smaller more frequent meals -reduce caffeine, no alcohol -Loperamide (Imodium-AD) if diarrhea -Prevacid for heart  burn  General health when sick  -Hydration -wash your hands frequently -keep surfaces clean -change pillow cases and sheets often -Get fresh air but do not exercise strenuously -Vitamin D, double up on it - Vitamin C -Zinc

## 2018-09-23 DIAGNOSIS — E113511 Type 2 diabetes mellitus with proliferative diabetic retinopathy with macular edema, right eye: Secondary | ICD-10-CM | POA: Diagnosis not present

## 2018-09-23 DIAGNOSIS — E113512 Type 2 diabetes mellitus with proliferative diabetic retinopathy with macular edema, left eye: Secondary | ICD-10-CM | POA: Diagnosis not present

## 2018-09-23 DIAGNOSIS — H35371 Puckering of macula, right eye: Secondary | ICD-10-CM | POA: Diagnosis not present

## 2018-09-23 DIAGNOSIS — H35372 Puckering of macula, left eye: Secondary | ICD-10-CM | POA: Diagnosis not present

## 2018-09-27 ENCOUNTER — Telehealth: Payer: Self-pay

## 2018-09-27 NOTE — Telephone Encounter (Signed)
Feels somewhat better but not fully. She has taken all medications, she is on her second round on antibiotics.  Eye doctor thinks she needs referral to kidney specialist, that the eylea drops aren't working because its not her eyes that are the issue.

## 2018-09-28 ENCOUNTER — Other Ambulatory Visit: Payer: Self-pay | Admitting: Adult Health

## 2018-09-28 DIAGNOSIS — E875 Hyperkalemia: Secondary | ICD-10-CM

## 2018-09-28 NOTE — Telephone Encounter (Signed)
Patient notified. States that Dr. [name] whom she has seen for a long time states that it's her kidneys that are the issue.

## 2018-09-29 NOTE — Telephone Encounter (Signed)
Patient states that she has an appointment in March and would like to wait until then to have her labs done at that visit. States that her sinuses are not bothering at all right now.

## 2018-09-30 NOTE — Telephone Encounter (Signed)
Patient is not going to wait to get labs done, is scheduled for Feb. 18th

## 2018-10-05 ENCOUNTER — Other Ambulatory Visit: Payer: Medicare Other

## 2018-10-05 DIAGNOSIS — E875 Hyperkalemia: Secondary | ICD-10-CM

## 2018-10-06 LAB — BASIC METABOLIC PANEL WITH GFR
BUN: 21 mg/dL (ref 7–25)
CO2: 30 mmol/L (ref 20–32)
Calcium: 9.8 mg/dL (ref 8.6–10.4)
Chloride: 103 mmol/L (ref 98–110)
Creat: 0.89 mg/dL (ref 0.50–0.99)
GFR, Est African American: 77 mL/min/{1.73_m2} (ref >=60)
GFR, Est Non African American: 67 mL/min/{1.73_m2} (ref >=60)
Glucose, Bld: 137 mg/dL — ABNORMAL HIGH (ref 65–99)
Potassium: 5.3 mmol/L (ref 3.5–5.3)
Sodium: 143 mmol/L (ref 135–146)

## 2018-10-06 LAB — MAGNESIUM: Magnesium: 1.5 mg/dL (ref 1.5–2.5)

## 2018-10-25 ENCOUNTER — Encounter: Payer: Self-pay | Admitting: Internal Medicine

## 2018-10-25 NOTE — Progress Notes (Signed)
This very nice 68 y.o. MWF presents for 3 month follow up with HTN, HLD, T2_NIDDM w/CKD2 & PN and Vitamin D Deficiency.      Patient is followed expectantly for labile HTN (1990's) & BP has been normal at home. Today's BP is at goal -124/68. Patient has had no complaints of any cardiac type chest pain, palpitations, dyspnea / orthopnea / PND, dizziness, claudication, or dependent edema.     Hyperlipidemia is controlled with diet & meds. Patient denies myalgias or other med SE's. Last Lipids were  Lab Results  Component Value Date   CHOL 213 (H) 07/20/2018   HDL 59 07/20/2018   LDLCALC 123 (H) 07/20/2018   TRIG 193 (H) 07/20/2018   CHOLHDL 3.6 07/20/2018      Also, the patient has history of Gestational Diabetes (1980's and recovered til starting Metformin in 2006 and dx'd with T2_NIDDM. Patient is followed by Dr Jannifer Franklin for painful peripheral sensory neuropathy and has bilat ankle braces for foot drop. She has CKD2 (GFR 67 ml/min). She is followed by Dr [name] for "wet" MD and has received Avastin injections on the past. She denies symptoms of reactive hypoglycemia or diabetic polys, and she reports CBG's usually less than 140's.  Last A1c was not at goal: Lab Results  Component Value Date   HGBA1C 7.7 (H) 07/20/2018      Further, the patient also has history of Vitamin D Deficiency("26" / Jan 2016)  and supplements vitamin D without any suspected side-effects. Last vitamin D was still sl low (goal is 70-100):   Lab Results  Component Value Date   VD25OH 49 03/11/2018   Current Outpatient Medications on File Prior to Visit  Medication Sig  . Calcium Citrate-Vitamin D (CITRACAL/VITAMIN D PO) Take 1 tablet by mouth daily.  . Cholecalciferol (VITAMIN D PO) Take 1,000 Units by mouth 2 (two) times daily. Reported on 02/07/2016  . DULoxetine (CYMBALTA) 30 MG capsule TAKE 1 CAPSULE BY MOUTH EVERY DAY  . glimepiride (AMARYL) 2 MG tablet TAKE 1/2 TO 1 TABLET BY MOUTH DAILY FOR DIABETES  .  Magnesium 250 MG TABS Take 2 tablets by mouth daily.   . metFORMIN (GLUCOPHAGE-XR) 500 MG 24 hr tablet TAKE 1 TO 2 TABLETS TWICE DAILY WITH FOOD AS DIRECTED FOR DIABETES   No current facility-administered medications on file prior to visit.    Allergies  Allergen Reactions  . Gabapentin Swelling    Swelling in legs and feet   PMHx:   Past Medical History:  Diagnosis Date  . Abnormality of gait 07/28/2013  . Diabetes ([place])   . Diabetic peripheral neuropathy ([place])   . Diabetic retinopathy ([place])   . Foot drop, bilateral 07/31/2014  . Peripheral edema   . Polyneuropathy in diabetes(357.2) 07/28/2013   Immunization History  Administered Date(s) Administered  . Influenza, High Dose Seasonal PF 05/12/2016, 05/24/2018  . Influenza-Unspecified 07/03/2017  . Pneumococcal Conjugate-13 02/07/2016  . Pneumococcal Polysaccharide-23 09/03/2017  . Td 02/07/2016   Past Surgical History:  Procedure Laterality Date  . ABDOMINAL HYSTERECTOMY    . ANKLE FRACTURE SURGERY     left  . CATARACT EXTRACTION, BILATERAL    . HIP SURGERY     right  . TONSILLECTOMY     FHx:    Reviewed / unchanged  SHx:    Reviewed / unchanged   Systems Review:  Constitutional: Denies fever, chills, wt changes, headaches, insomnia, fatigue, night sweats, change in appetite. Eyes: Denies redness, blurred vision,  diplopia, discharge, itchy, watery eyes.  ENT: Denies discharge, congestion, post nasal drip, epistaxis, sore throat, earache, hearing loss, dental pain, tinnitus, vertigo, sinus pain, snoring.  CV: Denies chest pain, palpitations, irregular heartbeat, syncope, dyspnea, diaphoresis, orthopnea, PND, claudication or edema. Respiratory: denies cough, dyspnea, DOE, pleurisy, hoarseness, laryngitis, wheezing.  Gastrointestinal: Denies dysphagia, odynophagia, heartburn, reflux, water brash, abdominal pain or cramps, nausea, vomiting, bloating, diarrhea, constipation, hematemesis, melena, hematochezia  or  hemorrhoids. Genitourinary: Denies dysuria, frequency, urgency, nocturia, hesitancy, discharge, hematuria or flank pain. Musculoskeletal: Denies arthralgias, myalgias, stiffness, jt. swelling, pain, limping or strain/sprain.  Skin: Denies pruritus, rash, hives, warts, acne, eczema or change in skin lesion(s). Neuro: No weakness, tremor, incoordination or  spasms. Psychiatric: Denies confusion, memory loss or sensory loss. Endo: Denies change in weight, skin or hair change.  Heme/Lymph: No excessive bleeding, bruising or enlarged lymph nodes.  Physical Exam  BP 124/68   Pulse 72   Temp (!) 97.4 F (36.3 C)   Resp 16   Ht 5\' 5"  (1.651 m)   Wt 168 lb 9.6 oz (76.5 kg)   BMI 28.06 kg/m   Appears  well nourished, well groomed  and in no distress.  Eyes: PERRLA, EOMs, conjunctiva no swelling or erythema. Sinuses: No frontal/maxillary tenderness ENT/Mouth: EAC's clear, TM's nl w/o erythema, bulging. Nares clear w/o erythema, swelling, exudates. Oropharynx clear without erythema or exudates. Oral hygiene is good. Tongue normal, non obstructing. Hearing intact.  Neck: Supple. Thyroid not palpable. Car 2+/2+ without bruits, nodes or JVD. Chest: Respirations nl with BS clear & equal w/o rales, rhonchi, wheezing or stridor.  Cor: Heart sounds normal w/ regular rate and rhythm without sig. murmurs, gallops, clicks or rubs. Peripheral pulses normal and equal  without edema.  Abdomen: Soft & bowel sounds normal. Non-tender w/o guarding, rebound, hernias, masses or organomegaly.  Lymphatics: Unremarkable.  Musculoskeletal: Full ROM all peripheral extremities, joint stability, 5/5 strength and normal gait.  Skin: Warm, dry without exposed rashes, lesions or ecchymosis apparent.  Neuro: Cranial nerves intact, reflexes equal bilaterally. Sensory-motor testing grossly intact. Tendon reflexes grossly intact.  Pysch: Alert & oriented x 3.  Insight and judgement nl & appropriate. No  ideations.  Assessment and Plan:  1. Essential hypertension  - Continue medication, monitor blood pressure at home.  - Continue DASH diet.  Reminder to go to the ER if any CP,  SOB, nausea, dizziness, severe HA, changes vision/speech.  - CBC with Differential/Platelet - COMPLETE METABOLIC PANEL WITH GFR - Magnesium - TSH  2. Hyperlipidemia, mixed  - Continue diet/meds, exercise,& lifestyle modifications.  - Continue monitor periodic cholesterol/liver & renal functions   - Lipid panel - TSH  3. Type 2 diabetes mellitus with stage 2 chronic kidney disease, without long-term current use of insulin (HCC)  - Continue diet, exercise  - Lifestyle modifications.  - Monitor appropriate labs.  - Hemoglobin A1c - Insulin, random  4. Vitamin D deficiency  - Continue supplementation.  - VITAMIN D 25 Hydroxyl  5. Diabetic polyneuropathy associated with diabetes mellitus due to underlying condition ([place])  - Hemoglobin A1c - Insulin, random  6. Medication management  - CBC with Differential/Platelet - COMPLETE METABOLIC PANEL WITH GFR - Magnesium - Lipid panel - TSH - Hemoglobin A1c - Insulin, random - VITAMIN D 25 Hydroxyl       Discussed  regular exercise, BP monitoring, weight control to achieve/maintain BMI less than 25 and discussed med and SE's. Recommended labs to assess and monitor clinical status with further  disposition pending results of labs. Over 30 minutes of exam, counseling, chart review was performed.

## 2018-10-25 NOTE — Patient Instructions (Signed)

## 2018-10-26 ENCOUNTER — Ambulatory Visit (INDEPENDENT_AMBULATORY_CARE_PROVIDER_SITE_OTHER): Payer: Medicare Other | Admitting: Internal Medicine

## 2018-10-26 VITALS — BP 124/68 | HR 72 | Temp 97.4°F | Resp 16 | Ht 65.0 in | Wt 168.6 lb

## 2018-10-26 DIAGNOSIS — N182 Chronic kidney disease, stage 2 (mild): Secondary | ICD-10-CM | POA: Diagnosis not present

## 2018-10-26 DIAGNOSIS — Z79899 Other long term (current) drug therapy: Secondary | ICD-10-CM

## 2018-10-26 DIAGNOSIS — E559 Vitamin D deficiency, unspecified: Secondary | ICD-10-CM

## 2018-10-26 DIAGNOSIS — E1122 Type 2 diabetes mellitus with diabetic chronic kidney disease: Secondary | ICD-10-CM

## 2018-10-26 DIAGNOSIS — E782 Mixed hyperlipidemia: Secondary | ICD-10-CM

## 2018-10-26 DIAGNOSIS — I10 Essential (primary) hypertension: Secondary | ICD-10-CM

## 2018-10-26 DIAGNOSIS — E0842 Diabetes mellitus due to underlying condition with diabetic polyneuropathy: Secondary | ICD-10-CM

## 2018-10-27 ENCOUNTER — Other Ambulatory Visit: Payer: Self-pay | Admitting: Internal Medicine

## 2018-10-27 DIAGNOSIS — N183 Chronic kidney disease, stage 3 (moderate): Principal | ICD-10-CM

## 2018-10-27 DIAGNOSIS — E1122 Type 2 diabetes mellitus with diabetic chronic kidney disease: Secondary | ICD-10-CM

## 2018-10-27 LAB — COMPLETE METABOLIC PANEL WITH GFR
AG Ratio: 1.6 (calc) (ref 1.0–2.5)
ALT: 12 U/L (ref 6–29)
AST: 13 U/L (ref 10–35)
Albumin: 3.9 g/dL (ref 3.6–5.1)
Alkaline phosphatase (APISO): 60 U/L (ref 37–153)
BUN/Creatinine Ratio: 21 (calc) (ref 6–22)
BUN: 22 mg/dL (ref 7–25)
CO2: 31 mmol/L (ref 20–32)
Calcium: 9.8 mg/dL (ref 8.6–10.4)
Chloride: 102 mmol/L (ref 98–110)
Creat: 1.03 mg/dL — ABNORMAL HIGH (ref 0.50–0.99)
GFR, Est African American: 65 mL/min/{1.73_m2} (ref >=60)
GFR, Est Non African American: 56 mL/min/{1.73_m2} — ABNORMAL LOW (ref >=60)
Globulin: 2.5 g/dL (calc) (ref 1.9–3.7)
Glucose, Bld: 150 mg/dL — ABNORMAL HIGH (ref 65–99)
Potassium: 5.4 mmol/L — ABNORMAL HIGH (ref 3.5–5.3)
Sodium: 141 mmol/L (ref 135–146)
Total Bilirubin: 0.4 mg/dL (ref 0.2–1.2)
Total Protein: 6.4 g/dL (ref 6.1–8.1)

## 2018-10-27 LAB — CBC WITH DIFFERENTIAL/PLATELET
Absolute Monocytes: 540 cells/uL (ref 200–950)
Basophils Absolute: 50 cells/uL (ref 0–200)
Basophils Relative: 0.7 %
Eosinophils Absolute: 158 cells/uL (ref 15–500)
Eosinophils Relative: 2.2 %
HCT: 36.4 % (ref 35.0–45.0)
Hemoglobin: 12.2 g/dL (ref 11.7–15.5)
Lymphs Abs: 1958 cells/uL (ref 850–3900)
MCH: 30.4 pg (ref 27.0–33.0)
MCHC: 33.5 g/dL (ref 32.0–36.0)
MCV: 90.8 fL (ref 80.0–100.0)
MPV: 9.3 fL (ref 7.5–12.5)
Monocytes Relative: 7.5 %
Neutro Abs: 4493 cells/uL (ref 1500–7800)
Neutrophils Relative %: 62.4 %
Platelets: 408 10*3/uL — ABNORMAL HIGH (ref 140–400)
RBC: 4.01 10*6/uL (ref 3.80–5.10)
RDW: 13.2 % (ref 11.0–15.0)
Total Lymphocyte: 27.2 %
WBC: 7.2 10*3/uL (ref 3.8–10.8)

## 2018-10-27 LAB — VITAMIN D 25 HYDROXY (VIT D DEFICIENCY, FRACTURES): Vit D, 25-Hydroxy: 59 ng/mL (ref 30–100)

## 2018-10-27 LAB — HEMOGLOBIN A1C
Hgb A1c MFr Bld: 8.4 % of total Hgb — ABNORMAL HIGH (ref <5.7)
Mean Plasma Glucose: 194 (calc)
eAG (mmol/L): 10.8 (calc)

## 2018-10-27 LAB — MAGNESIUM: Magnesium: 1.3 mg/dL — ABNORMAL LOW (ref 1.5–2.5)

## 2018-10-27 LAB — LIPID PANEL
Cholesterol: 173 mg/dL (ref <200)
HDL: 52 mg/dL (ref >=50)
LDL Cholesterol (Calc): 97 mg/dL (calc)
Non-HDL Cholesterol (Calc): 121 mg/dL (calc) (ref <130)
Total CHOL/HDL Ratio: 3.3 (calc) (ref <5.0)
Triglycerides: 138 mg/dL (ref <150)

## 2018-10-27 LAB — INSULIN, RANDOM: Insulin: 5.1 u[IU]/mL

## 2018-10-27 LAB — TSH: TSH: 1.4 mIU/L (ref 0.40–4.50)

## 2018-10-27 MED ORDER — ROSUVASTATIN CALCIUM 20 MG PO TABS: ORAL_TABLET | ORAL | 1 refills | Status: DC

## 2018-10-28 ENCOUNTER — Encounter: Payer: Self-pay | Admitting: *Deleted

## 2018-11-02 DIAGNOSIS — E113511 Type 2 diabetes mellitus with proliferative diabetic retinopathy with macular edema, right eye: Secondary | ICD-10-CM | POA: Diagnosis not present

## 2018-11-02 DIAGNOSIS — H35372 Puckering of macula, left eye: Secondary | ICD-10-CM | POA: Diagnosis not present

## 2018-11-02 DIAGNOSIS — E113512 Type 2 diabetes mellitus with proliferative diabetic retinopathy with macular edema, left eye: Secondary | ICD-10-CM | POA: Diagnosis not present

## 2018-11-02 DIAGNOSIS — H35371 Puckering of macula, right eye: Secondary | ICD-10-CM | POA: Diagnosis not present

## 2018-11-04 ENCOUNTER — Inpatient Hospital Stay: Admission: RE | Admit: 2018-11-04 | Payer: Medicare Other | Source: Ambulatory Visit

## 2018-11-08 DIAGNOSIS — H26492 Other secondary cataract, left eye: Secondary | ICD-10-CM | POA: Diagnosis not present

## 2018-11-08 DIAGNOSIS — E113511 Type 2 diabetes mellitus with proliferative diabetic retinopathy with macular edema, right eye: Secondary | ICD-10-CM | POA: Diagnosis not present

## 2018-11-08 DIAGNOSIS — E113512 Type 2 diabetes mellitus with proliferative diabetic retinopathy with macular edema, left eye: Secondary | ICD-10-CM | POA: Diagnosis not present

## 2018-11-08 DIAGNOSIS — H35372 Puckering of macula, left eye: Secondary | ICD-10-CM | POA: Diagnosis not present

## 2018-11-11 ENCOUNTER — Encounter: Payer: Self-pay | Admitting: Internal Medicine

## 2018-12-02 ENCOUNTER — Ambulatory Visit: Payer: Medicare Other

## 2018-12-07 ENCOUNTER — Other Ambulatory Visit: Payer: Self-pay | Admitting: Adult Health

## 2019-01-03 DIAGNOSIS — H26492 Other secondary cataract, left eye: Secondary | ICD-10-CM | POA: Diagnosis not present

## 2019-01-03 DIAGNOSIS — E113512 Type 2 diabetes mellitus with proliferative diabetic retinopathy with macular edema, left eye: Secondary | ICD-10-CM | POA: Diagnosis not present

## 2019-01-04 DIAGNOSIS — E113511 Type 2 diabetes mellitus with proliferative diabetic retinopathy with macular edema, right eye: Secondary | ICD-10-CM | POA: Diagnosis not present

## 2019-01-04 DIAGNOSIS — H35371 Puckering of macula, right eye: Secondary | ICD-10-CM | POA: Diagnosis not present

## 2019-01-05 ENCOUNTER — Ambulatory Visit (INDEPENDENT_AMBULATORY_CARE_PROVIDER_SITE_OTHER): Payer: Medicare Other

## 2019-01-05 ENCOUNTER — Other Ambulatory Visit: Payer: Self-pay

## 2019-01-05 VITALS — BP 130/74 | Temp 97.3°F | Wt 170.0 lb

## 2019-01-05 DIAGNOSIS — Z79899 Other long term (current) drug therapy: Secondary | ICD-10-CM | POA: Diagnosis not present

## 2019-01-05 NOTE — Progress Notes (Signed)
PATIENT reports for NURSE visit for recheck KIDNEY function.  Vitals were entered into epic at check-in.

## 2019-01-06 LAB — COMPLETE METABOLIC PANEL WITH GFR
AG Ratio: 1.7 (calc) (ref 1.0–2.5)
ALT: 11 U/L (ref 6–29)
AST: 12 U/L (ref 10–35)
Albumin: 4 g/dL (ref 3.6–5.1)
Alkaline phosphatase (APISO): 63 U/L (ref 37–153)
BUN: 21 mg/dL (ref 7–25)
CO2: 30 mmol/L (ref 20–32)
Calcium: 10.2 mg/dL (ref 8.6–10.4)
Chloride: 100 mmol/L (ref 98–110)
Creat: 0.9 mg/dL (ref 0.50–0.99)
GFR, Est African American: 76 mL/min/{1.73_m2} (ref >=60)
GFR, Est Non African American: 66 mL/min/{1.73_m2} (ref >=60)
Globulin: 2.4 g/dL (calc) (ref 1.9–3.7)
Glucose, Bld: 167 mg/dL — ABNORMAL HIGH (ref 65–99)
Potassium: 5.7 mmol/L — ABNORMAL HIGH (ref 3.5–5.3)
Sodium: 141 mmol/L (ref 135–146)
Total Bilirubin: 0.4 mg/dL (ref 0.2–1.2)
Total Protein: 6.4 g/dL (ref 6.1–8.1)

## 2019-01-11 ENCOUNTER — Telehealth: Payer: Self-pay | Admitting: Neurology

## 2019-01-11 NOTE — Telephone Encounter (Signed)
Due to current COVID 19 pandemic, our office is severely reducing in office visits until further notice, in order to minimize the risk to our patients and healthcare providers.   Called patient to offer a virtual visit for her 6/1 appt. Patient declined and requested an in office visit. Patient is scheduled for an in office visit with some precautions. Patient understands that upon arrival she will have her temperature checked/will be screened for COVID. Patient is aware that only one person is allowed in office with her if necessary and will also have temp checked.

## 2019-01-17 ENCOUNTER — Encounter: Payer: Self-pay | Admitting: Neurology

## 2019-01-17 ENCOUNTER — Ambulatory Visit (INDEPENDENT_AMBULATORY_CARE_PROVIDER_SITE_OTHER): Payer: Medicare Other | Admitting: Neurology

## 2019-01-17 ENCOUNTER — Other Ambulatory Visit: Payer: Self-pay

## 2019-01-17 VITALS — BP 152/88 | HR 73 | Temp 98.5°F | Ht 63.0 in | Wt 171.0 lb

## 2019-01-17 DIAGNOSIS — E0842 Diabetes mellitus due to underlying condition with diabetic polyneuropathy: Secondary | ICD-10-CM | POA: Diagnosis not present

## 2019-01-17 NOTE — Progress Notes (Signed)
Reason for visit: Diabetic peripheral neuropathy  Grace [name] is an 68 y.o. female  History of present illness:  Grace [name] is a 68 year old right-handed white female with a history of diabetes with diabetic peripheral neuropathy.  Her last hemoglobin A1c was 8.4.  The patient has done fairly well for long time frame on low-dose Cymbalta taking 30 mg daily.  The patient is sleeping well, she does have some gait instability, she wears braces on both feet.  She does have some numbness of the left index finger but otherwise her hands are unaffected.  She may feel some numbness up to the knee level in the legs.  She does fall on occasion, she may use a cane or a walker when outside of the house.  She uses a shower chair to help prevent falls.  She returns for an evaluation.  Past Medical History:  Diagnosis Date  . Abnormality of gait 07/28/2013  . Diabetes ([place])   . Diabetic peripheral neuropathy ([place])   . Diabetic retinopathy ([place])   . Foot drop, bilateral 07/31/2014  . Peripheral edema   . Polyneuropathy in diabetes(357.2) 07/28/2013    Past Surgical History:  Procedure Laterality Date  . ABDOMINAL HYSTERECTOMY    . ANKLE FRACTURE SURGERY     left  . CATARACT EXTRACTION, BILATERAL    . HIP SURGERY     right  . TONSILLECTOMY      Family History  Problem Relation Age of Onset  . Diabetes Mother   . Diabetes Brother   . Neuropathy Neg Hx     Social history:  reports that she has never smoked. She has never used smokeless tobacco. She reports that she does not drink alcohol or use drugs.    Allergies  Allergen Reactions  . Gabapentin Swelling    Swelling in legs and feet    Medications:  Prior to Admission medications   Medication Sig Start Date End Date Taking? Authorizing Provider  Calcium Citrate-Vitamin D (CITRACAL/VITAMIN D PO) Take 1 tablet by mouth daily.   Yes [provider]  Cholecalciferol (VITAMIN D PO) Take 1,000 Units by mouth 2 (two)  times daily. Reported on 02/07/2016   Yes [provider]  DULoxetine (CYMBALTA) 30 MG capsule TAKE 1 CAPSULE BY MOUTH EVERY DAY 04/20/18  Yes [place], Megan, NP  glimepiride (AMARYL) 2 MG tablet TAKE 1/2 TO 1 TABLET BY MOUTH DAILY FOR DIABETES 05/08/18  Yes Unk Pinto, MD  Magnesium 250 MG TABS Take 2 tablets by mouth daily.    Yes [provider]  metFORMIN (GLUCOPHAGE-XR) 500 MG 24 hr tablet Take 2 Tablets 2 x /day with Meals for Diabetes 12/07/18  Yes Unk Pinto, MD    ROS:  Out of a complete 14 system review of symptoms, the patient complains only of the following symptoms, and all other reviewed systems are negative.  Numbness Walking problems  Blood pressure (!) 152/88, pulse 73, temperature 98.5 F (36.9 C), temperature source Oral, height 5\' 3"  (1.6 m), weight 171 lb (77.6 kg), SpO2 98 %.  Physical Exam  General: The patient is alert and cooperative at the time of the examination.  Skin: No significant peripheral edema is noted.   Neurologic Exam  Mental status: The patient is alert and oriented x 3 at the time of the examination. The patient has apparent normal recent and remote memory, with an apparently normal attention span and concentration ability.   Cranial nerves: Facial symmetry is present. Speech is  normal, no aphasia or dysarthria is noted. Extraocular movements are full. Visual fields are full.  Motor: The patient has good strength in all 4 extremities.  Sensory examination: Soft touch sensation is symmetric on the face, arms, and legs.  Coordination: The patient has good finger-nose-finger and heel-to-shin bilaterally.  Gait and station: The patient has a slightly wide-based gait, the patient can walk independently.  Tandem gait is very unsteady.  Romberg is negative but is slightly unsteady.  Reflexes: Deep tendon reflexes are symmetric, but are depressed.   Assessment/Plan:  1.  Diabetic peripheral neuropathy  The patient  will continue along with her Cymbalta, she will follow-up through this office in 1 year, sooner if needed.  She will call for any dose adjustments of her medication.  Jill Alexanders MD 01/17/2019 4:09 PM  [facility] [address] [place], [place] [address]  Phone [phone] Fax [phone]

## 2019-01-26 NOTE — Progress Notes (Addendum)
Assessment and Plan:   Hypertension -Continue medication, monitor blood pressure at home. Continue DASH diet.  Reminder to go to the ER if any CP, SOB, nausea, dizziness, severe HA, changes vision/speech, left arm numbness and tingling and jaw pain.  Cholesterol -Continue diet and exercise. Check cholesterol.    Diabetes with diabetic chronic kidney disease and with diabetic polyneuropathy -Continue diet and exercise. Check A1C - declines ACE/ARB - not on bASA due to mac degen  Vitamin D Def - check level and continue medications.   Potassium elevated, magnesium is better, serum protein down, will recheck 2-4 weeks with urine to evaluate.   Continue diet and meds as discussed. Further disposition pending results of labs. Discussed med's effects and SE's.   Over 30 minutes of exam, counseling, chart review, and critical decision making was performed Future Appointments  Date Time Provider [place]  05/03/2019  9:00 AM Unk Pinto, MD [facility] None  08/09/2019 11:15 AM Vicie Mutters, PA-C [facility] None  01/19/2020 11:15 AM Suzzanne Cloud, NP [facility] None     HPI 68 y.o. female  presents for 3 month follow up on hypertension, cholesterol, diabetes and vitamin D deficiency.   Her blood pressure has been controlled at home, today their BP is BP: 120/82  She does not workout. She denies chest pain, shortness of breath, dizziness. BMI is Body mass index is 29.72 kg/m., she is working on diet and exercise. Wt Readings from Last 3 Encounters:  01/27/19 167 lb 12.8 oz (76.1 kg)  01/17/19 171 lb (77.6 kg)  01/05/19 170 lb (77.1 kg)    She is on cholesterol medication and denies myalgias. Her cholesterol is not at goal. The cholesterol last visit was:   Lab Results  Component Value Date   CHOL 173 10/26/2018   HDL 52 10/26/2018   LDLCALC 97 10/26/2018   TRIG 138 10/26/2018   CHOLHDL 3.3 10/26/2018   She has been working on diet and exercise for Diabetes Chronic  kidney disease NOT on ACE/ARB Polyneuropathy following with Dr. Jannifer Franklin Hyperlipidemia  she is not on bASA due to Mac Degeneration,  states watched 46 year old mother with DM take tons of meds and the "meds hurt her more than the DM" She is on metformin 2030m a day Cinnamon 10030mBID amaryl 2 mg a day Meter: Acucheck 2 x a week Eye Exam: once a month to see Dr. RaZadie Rhine5/2020 She eats breakfast, no lunch, eats dinner.  denies polydipsia and polyuria.  Last A1C was:  Lab Results  Component Value Date   HGBA1C 8.4 (H) 10/26/2018   Lab Results  Component Value Date   GFRNONAA 66 01/05/2019   Patient is on Vitamin D supplement.   Lab Results  Component Value Date   VD25OH 59 10/26/2018      Current Medications:  Current Outpatient Medications on File Prior to Visit  Medication Sig  . Calcium Citrate-Vitamin D (CITRACAL/VITAMIN D PO) Take 1 tablet by mouth daily.  . Cholecalciferol (VITAMIN D PO) Take 1,000 Units by mouth 2 (two) times daily. Reported on 02/07/2016  . DULoxetine (CYMBALTA) 30 MG capsule TAKE 1 CAPSULE BY MOUTH EVERY DAY  . glimepiride (AMARYL) 2 MG tablet TAKE 1/2 TO 1 TABLET BY MOUTH DAILY FOR DIABETES  . Magnesium 250 MG TABS Take 2 tablets by mouth daily.   . metFORMIN (GLUCOPHAGE-XR) 500 MG 24 hr tablet Take 2 Tablets 2 x /day with Meals for Diabetes   No current facility-administered medications on file  prior to visit.     Medical History:  Past Medical History:  Diagnosis Date  . Abnormality of gait 07/28/2013  . Diabetes ([place])   . Diabetic peripheral neuropathy ([place])   . Diabetic retinopathy ([place])   . Foot drop, bilateral 07/31/2014  . Peripheral edema   . Polyneuropathy in diabetes(357.2) 07/28/2013   Allergies:  Allergies  Allergen Reactions  . Gabapentin Swelling    Swelling in legs and feet     Review of Systems:  Review of Systems  Constitutional: Positive for malaise/fatigue. Negative for chills and fever.  HENT: Negative for  congestion, ear pain and sore throat.   Eyes: Negative.   Respiratory: Negative for cough, shortness of breath and wheezing.   Cardiovascular: Negative for chest pain, palpitations and leg swelling.  Gastrointestinal: Negative for blood in stool, constipation, diarrhea, heartburn, melena, nausea and vomiting.  Genitourinary: Negative.   Skin: Negative.   Neurological: Positive for sensory change. Negative for dizziness, loss of consciousness and headaches.  Psychiatric/Behavioral: Negative for depression. The patient is not nervous/anxious and does not have insomnia.     Family history- Review and unchanged Social history- Review and unchanged Physical Exam: BP 120/82   Pulse 72   Temp 97.7 F (36.5 C)   Ht _0  (1.6 m)   Wt 167 lb 12.8 oz (76.1 kg)   SpO2 98%   BMI 29.72 kg/m  Wt Readings from Last 3 Encounters:  01/27/19 167 lb 12.8 oz (76.1 kg)  01/17/19 171 lb (77.6 kg)  01/05/19 170 lb (77.1 kg)   General Appearance: Well nourished, in no apparent distress. Eyes: PERRLA, EOMs, conjunctiva no swelling or erythema Sinuses: No Frontal/maxillary tenderness ENT/Mouth: Ext aud canals clear, TMs without erythema, bulging. No erythema, swelling, or exudate on post pharynx.  Tonsils not swollen or erythematous. Hearing normal.  Neck: Supple, thyroid normal.  Respiratory: Respiratory effort normal, BS equal bilaterally without rales, rhonchi, wheezing or stridor.  Cardio: RRR with no MRGs. Brisk peripheral pulses without edema.  Abdomen: Soft, + BS.  Non tender, no guarding, rebound, hernias, masses. Lymphatics: Non tender without lymphadenopathy.  Musculoskeletal: Full ROM, 5/5 strength, Normal gait Skin: Warm, dry without rashes, lesions, ecchymosis.  Neuro: Cranial nerves intact. No cerebellar symptoms.  Psych: Awake and oriented X 3, normal affect, Insight and Judgment appropriate.    Vicie Mutters, PA-C 9:51 AM [facility]

## 2019-01-27 ENCOUNTER — Encounter: Payer: Self-pay | Admitting: Physician Assistant

## 2019-01-27 ENCOUNTER — Ambulatory Visit (INDEPENDENT_AMBULATORY_CARE_PROVIDER_SITE_OTHER): Payer: Medicare Other | Admitting: Physician Assistant

## 2019-01-27 ENCOUNTER — Other Ambulatory Visit: Payer: Self-pay

## 2019-01-27 VITALS — BP 120/82 | HR 72 | Temp 97.7°F | Ht 63.0 in | Wt 167.8 lb

## 2019-01-27 DIAGNOSIS — E8809 Other disorders of plasma-protein metabolism, not elsewhere classified: Secondary | ICD-10-CM

## 2019-01-27 DIAGNOSIS — Z79899 Other long term (current) drug therapy: Secondary | ICD-10-CM

## 2019-01-27 DIAGNOSIS — E1122 Type 2 diabetes mellitus with diabetic chronic kidney disease: Secondary | ICD-10-CM

## 2019-01-27 DIAGNOSIS — E782 Mixed hyperlipidemia: Secondary | ICD-10-CM

## 2019-01-27 DIAGNOSIS — N182 Chronic kidney disease, stage 2 (mild): Secondary | ICD-10-CM

## 2019-01-27 DIAGNOSIS — E0842 Diabetes mellitus due to underlying condition with diabetic polyneuropathy: Secondary | ICD-10-CM

## 2019-01-27 DIAGNOSIS — R8290 Unspecified abnormal findings in urine: Secondary | ICD-10-CM

## 2019-01-27 DIAGNOSIS — I10 Essential (primary) hypertension: Secondary | ICD-10-CM | POA: Diagnosis not present

## 2019-01-27 NOTE — Patient Instructions (Signed)
Diabetes or even increased sugars put you at 300% increased risk of heart attack and stroke.  ALSO BEING DIABETIC YOU MAY NOT HAVE ANY PAIN WITH A HEART ATTACK.  Even worse of a chance of no pain if you are a woman.  It is very unlikely that you will have any pain with a heart attack. Likely your symptoms will be very subtle, even for very severe disease.  Your symptoms for a heart attack will likely occur when you exert your self or exercise and include: Shortness of breath Sweating Nausea Dizziness Fast or irregular heart beats Fatigue   It makes me feel better if my diabetics get their heart rate up with exercise once or twice a week and pay close attention to your body. If there is ANY change in your exercise capacity or if you have symptoms above, please STOP and call 911 or call to come to the office.   PLEASE REMEMBER:  Diabetes is preventable! Up to 21 percent of complications and morbidities among individuals with type 2 diabetes can be prevented, delayed, or effectively treated and minimized with regular visits to a health professional, appropriate monitoring and medication, and a healthy diet and lifestyle.   Here is some information to help you keep your heart healthy: Move it! - Aim for 30 mins of activity every day. Take it slowly at first. Talk to [place] before starting any new exercise program.   Lose it.  -Body Mass Index (BMI) can indicate if you need to lose weight. A healthy range is 18.5-24.9. For a BMI calculator, go to [email or web address]  Waist Management -Excess abdominal fat is a risk factor for heart disease, diabetes, asthma, stroke and more. Ideal waist circumference is less than 35" for women and less than 40" for men.   Eat Right -focus on fruits, vegetables, whole grains, and meals you make yourself. Avoid foods with trans fat and high sugar/sodium content.   Snooze or Snore? - Loud snoring can be a sign of sleep apnea, a significant risk factor for high blood  pressure, heart attach, stroke, and heart arrhythmias.  Kick the habit -Quit Smoking! Avoid second hand smoke. A single cigarette raises your blood pressure for 20 mins and increases the risk of heart attack and stroke for the next 24 hours.   Are Aspirin and Supplements right for you? -Add ENTERIC COATED low dose 81 mg Aspirin daily OR can do every other day if you have easy bruising to protect your heart and head. As well as to reduce risk of Colon Cancer by 20 %, Skin Cancer by 26 % , Melanoma by 46% and Pancreatic cancer by 60%  Say "No to Stress -There may be little you can do about problems that cause stress. However, techniques such as long walks, meditation, and exercise can help you manage it.   Start Now! - Make changes one at a time and set reasonable goals to increase your likelihood of success.    Intermittent fasting is more about strategy than starvation. Its meant to reset your body in different ways, hopefully with fitness and nutrition changes as a result.  Like any big switchover, though, results may vary when it comes down to the individual level. What works for your friends may not work for you, or vice versa. Thats why its helpful to play around with variations on intermittent fasting and healthy habits and find what works best for you.  WHAT IS INTERMITTENT FASTING AND WHY DO IT?  Intermittent fasting  doesnt involve specific foods, but rather, a strict schedule regarding when you eat. Also called time-restricted eating, the tactic has been praised for its contribution to weight loss, improved body composition, and decreased cravings. Preliminary research also suggests it may be beneficial for glucose tolerance, hormone regulation, better muscle mass and lower body fat.  Part of its appeal is the simplicity of the effort. Unlike some other trends, theres no calculations to intermittent fasting.  You simply eat within a certain block of time, usually a window of  8-10 hours. In the other big block of time -- about 14-16 hours, including when youre asleep -- you dont eat anything, not even snacks. You can drink water, coffee, tea or any other beverage that doesnt have calories.  For example, if you like having a late dinner, you might skip breakfast and have your first meal at noon and your last meal of the day at 8 p.m., and then not eat until noon again the next day.  IDEAS FOR GETTING STARTED  If youre new to the strategy, it may be helpful to eat within the typical circadian rhythm and keep eating within daylight hours. This can be especially beneficial if youre looking at intermittent fasting for weight-loss goals.  So first try only eating between 12pm to 8pm.  Outside of this time you may have water, black coffee, and hot tea. You may not eat it drink anything that has carbs, sugars, OR artificial sugars like diet soda.   Like any major eating and fitness shift, it can take time to find the perfect fit, so dont be afraid to experiment with different options -- including ditching intermittent fasting altogether if its simply not for you. But if it is, you may be surprised by some of the benefits that come along with the strategy.     When it comes to diets, agreement about the perfect plan isnt easy to find, even among the experts. Experts at the [place] developed an idea known as the Healthy Eating Plate. Just imagine a plate divided into logical, healthy portions.  The emphasis is on diet quality:  Load up on vegetables and fruits - one-half of your plate: Aim for color and variety, and remember that potatoes dont count.  Go for whole grains - one-quarter of your plate: Whole wheat, barley, wheat berries, quinoa, oats, brown rice, and foods made with them. If you want pasta, go with whole wheat pasta.  Protein power - one-quarter of your plate: Fish, chicken, beans, and nuts are all healthy, versatile protein  sources. Limit red meat.  The diet, however, does go beyond the plate, offering a few other suggestions.  Use healthy plant oils, such as olive, canola, soy, corn, sunflower and peanut. Check the labels, and avoid partially hydrogenated oil, which have unhealthy trans fats.  If youre thirsty, drink water. Coffee and tea are good in moderation, but skip sugary drinks and limit milk and dairy products to one or two daily servings.  The type of carbohydrate in the diet is more important than the amount. Some sources of carbohydrates, such as vegetables, fruits, whole grains, and beans--are healthier than others.  Finally, stay active.  Gastroparesis  Gastroparesis is a condition in which food takes longer than normal to empty from the stomach. The condition is usually long-lasting (chronic). It may also be called delayed gastric emptying. There is no cure, but there are treatments and things that you can do at home to help  relieve symptoms. Treating the underlying condition that causes gastroparesis can also help relieve symptoms. What are the causes? In many cases, the cause of this condition is not known. Possible causes include:  A hormone (endocrine) disorder, such as hypothyroidism or diabetes.  A nervous system disease, such as Parkinson's disease or multiple sclerosis.  Cancer, infection, or surgery that affects the stomach or vagus nerve. The vagus nerve runs from your chest, through your neck, to the lower part of your brain.  A connective tissue disorder, such as scleroderma.  Certain medicines. What increases the risk? You are more likely to develop this condition if you:  Have certain disorders or diseases, including: ? An endocrine disorder. ? An eating disorder. ? Amyloidosis. ? Scleroderma. ? Parkinson's disease. ? Multiple sclerosis. ? Cancer or infection of the stomach or the vagus nerve.  Have had surgery on the stomach or vagus nerve.  Take certain  medicines.  Are female. What are the signs or symptoms? Symptoms of this condition include:  Feeling full after eating very little.  Nausea.  Vomiting.  Heartburn.  Abdominal bloating.  Inconsistent blood sugar (glucose) levels on blood tests.  Lack of appetite.  Weight loss.  Acid from the stomach coming up into the esophagus (gastroesophageal reflux).  Sudden tightening (spasm) of the stomach, which can be painful. Symptoms may come and go. Some people may not notice any symptoms. How is this diagnosed? This condition is diagnosed with tests, such as:  Tests that check how long it takes food to move through the stomach and intestines. These tests include: ? Upper gastrointestinal (GI) series. For this test, you drink a liquid that shows up well on X-rays, and then X-rays will be taken of your intestines. ? Gastric emptying scintigraphy. For this test, you eat food that contains a small amount of radioactive material, and then scans are taken. ? Wireless capsule GI monitoring system. For this test, you swallow a pill (capsule) that records information about how foods and fluid move through your stomach.  Gastric manometry. For this test, a tube is passed down your throat and into your stomach to measure electrical and muscular activity.  Endoscopy. For this test, a long, thin tube is passed down your throat and into your stomach to check for problems in your stomach lining.  Ultrasound. This test uses sound waves to create images of inside the body. This can help rule out gallbladder disease or pancreatitis as a cause of your symptoms. How is this treated? There is no cure for gastroparesis. Treatment may include:  Treating the underlying cause.  Managing your symptoms by making changes to your diet and exercise habits.  Taking medicines to control nausea and vomiting and to stimulate stomach muscles.  Getting food through a feeding tube in the hospital. This may be  done in severe cases.  Having surgery to insert a device into your body that helps improve stomach emptying and control nausea and vomiting (gastric neurostimulator). Follow these instructions at home:  Take over-the-counter and prescription medicines only as told by your health care provider.  Follow instructions from your health care provider about eating or drinking restrictions. Your health care provider may recommend that you: ? Eat smaller meals more often. ? Eat low-fat foods. ? Eat low-fiber forms of high-fiber foods. For example, eat cooked vegetables instead of raw vegetables. ? Have only liquid foods instead of solid foods. Liquid foods are easier to digest.  Drink enough fluid to keep your urine pale yellow.  Exercise as often as told by your health care provider.  Keep all follow-up visits as told by your health care provider. This is important. Contact a health care provider if you:  Notice that your symptoms do not improve with treatment.  Have new symptoms. Get help right away if you:  Have severe abdominal pain that does not improve with treatment.  Have nausea that is severe or does not go away.  Cannot drink fluids without vomiting. Summary  Gastroparesis is a chronic condition in which food takes longer than normal to empty from the stomach.  Symptoms include nausea, vomiting, heartburn, abdominal bloating, and loss of appetite.  Eating smaller portions, and low-fat, low-fiber foods may help you manage your symptoms.  Get help right away if you have severe abdominal pain. This information is not intended to replace advice given to you by your health care provider. Make sure you discuss any questions you have with your health care provider. Document Released: 08/04/2005 Document Revised: 06/09/2017 Document Reviewed: 06/09/2017 Elsevier Interactive Patient Education  2019 [employer].

## 2019-01-28 LAB — LIPID PANEL
Cholesterol: 204 mg/dL — ABNORMAL HIGH (ref <200)
HDL: 58 mg/dL (ref >=50)
LDL Cholesterol (Calc): 116 mg/dL (calc) — ABNORMAL HIGH
Non-HDL Cholesterol (Calc): 146 mg/dL (calc) — ABNORMAL HIGH (ref <130)
Total CHOL/HDL Ratio: 3.5 (calc) (ref <5.0)
Triglycerides: 188 mg/dL — ABNORMAL HIGH (ref <150)

## 2019-01-28 LAB — COMPLETE METABOLIC PANEL WITH GFR
AG Ratio: 2.1 (calc) (ref 1.0–2.5)
ALT: 13 U/L (ref 6–29)
AST: 13 U/L (ref 10–35)
Albumin: 3.8 g/dL (ref 3.6–5.1)
Alkaline phosphatase (APISO): 61 U/L (ref 37–153)
BUN: 19 mg/dL (ref 7–25)
CO2: 31 mmol/L (ref 20–32)
Calcium: 9.6 mg/dL (ref 8.6–10.4)
Chloride: 104 mmol/L (ref 98–110)
Creat: 0.93 mg/dL (ref 0.50–0.99)
GFR, Est African American: 73 mL/min/{1.73_m2} (ref >=60)
GFR, Est Non African American: 63 mL/min/{1.73_m2} (ref >=60)
Globulin: 1.8 g/dL (calc) — ABNORMAL LOW (ref 1.9–3.7)
Glucose, Bld: 129 mg/dL — ABNORMAL HIGH (ref 65–99)
Potassium: 5.6 mmol/L — ABNORMAL HIGH (ref 3.5–5.3)
Sodium: 142 mmol/L (ref 135–146)
Total Bilirubin: 0.3 mg/dL (ref 0.2–1.2)
Total Protein: 5.6 g/dL — ABNORMAL LOW (ref 6.1–8.1)

## 2019-01-28 LAB — CBC WITH DIFFERENTIAL/PLATELET
Absolute Monocytes: 502 cells/uL (ref 200–950)
Basophils Absolute: 50 cells/uL (ref 0–200)
Basophils Relative: 0.8 %
Eosinophils Absolute: 211 cells/uL (ref 15–500)
Eosinophils Relative: 3.4 %
HCT: 35.9 % (ref 35.0–45.0)
Hemoglobin: 11.7 g/dL (ref 11.7–15.5)
Lymphs Abs: 1959 cells/uL (ref 850–3900)
MCH: 30 pg (ref 27.0–33.0)
MCHC: 32.6 g/dL (ref 32.0–36.0)
MCV: 92.1 fL (ref 80.0–100.0)
MPV: 9.5 fL (ref 7.5–12.5)
Monocytes Relative: 8.1 %
Neutro Abs: 3478 cells/uL (ref 1500–7800)
Neutrophils Relative %: 56.1 %
Platelets: 370 10*3/uL (ref 140–400)
RBC: 3.9 10*6/uL (ref 3.80–5.10)
RDW: 14.1 % (ref 11.0–15.0)
Total Lymphocyte: 31.6 %
WBC: 6.2 10*3/uL (ref 3.8–10.8)

## 2019-01-28 LAB — MAGNESIUM: Magnesium: 1.6 mg/dL (ref 1.5–2.5)

## 2019-01-28 LAB — HEMOGLOBIN A1C
Hgb A1c MFr Bld: 7.5 % of total Hgb — ABNORMAL HIGH (ref <5.7)
Mean Plasma Glucose: 169 (calc)
eAG (mmol/L): 9.3 (calc)

## 2019-01-28 LAB — TSH: TSH: 1.37 mIU/L (ref 0.40–4.50)

## 2019-01-28 NOTE — Addendum Note (Signed)
Addended by: Vicie Mutters R on: 01/28/2019 08:21 AM   Modules accepted: Orders

## 2019-02-14 ENCOUNTER — Other Ambulatory Visit: Payer: Self-pay | Admitting: Internal Medicine

## 2019-02-14 DIAGNOSIS — N289 Disorder of kidney and ureter, unspecified: Secondary | ICD-10-CM

## 2019-02-15 ENCOUNTER — Other Ambulatory Visit: Payer: Self-pay | Admitting: Internal Medicine

## 2019-02-15 DIAGNOSIS — E1122 Type 2 diabetes mellitus with diabetic chronic kidney disease: Secondary | ICD-10-CM

## 2019-02-15 DIAGNOSIS — N289 Disorder of kidney and ureter, unspecified: Secondary | ICD-10-CM

## 2019-02-15 DIAGNOSIS — N182 Chronic kidney disease, stage 2 (mild): Secondary | ICD-10-CM

## 2019-02-15 DIAGNOSIS — I10 Essential (primary) hypertension: Secondary | ICD-10-CM

## 2019-02-17 ENCOUNTER — Other Ambulatory Visit: Payer: Self-pay | Admitting: Internal Medicine

## 2019-02-17 ENCOUNTER — Ambulatory Visit (INDEPENDENT_AMBULATORY_CARE_PROVIDER_SITE_OTHER): Payer: Medicare Other

## 2019-02-17 ENCOUNTER — Other Ambulatory Visit: Payer: Self-pay

## 2019-02-17 DIAGNOSIS — N289 Disorder of kidney and ureter, unspecified: Secondary | ICD-10-CM

## 2019-02-17 NOTE — Progress Notes (Signed)
Patient presents to the office for a nurse visit to have labs done to check kidney function. Patient states that she averages about 32oz of water a day. Vitals taken and recorded.

## 2019-02-21 ENCOUNTER — Other Ambulatory Visit: Payer: Self-pay

## 2019-02-21 ENCOUNTER — Ambulatory Visit: Payer: Medicare Other

## 2019-02-21 DIAGNOSIS — E8809 Other disorders of plasma-protein metabolism, not elsewhere classified: Secondary | ICD-10-CM

## 2019-02-21 DIAGNOSIS — R8290 Unspecified abnormal findings in urine: Secondary | ICD-10-CM

## 2019-02-21 DIAGNOSIS — N289 Disorder of kidney and ureter, unspecified: Secondary | ICD-10-CM | POA: Diagnosis not present

## 2019-02-21 DIAGNOSIS — I10 Essential (primary) hypertension: Secondary | ICD-10-CM

## 2019-02-21 NOTE — Progress Notes (Signed)
Patient reports for LAB blood work which were entered into EPIC by the provider.  Vitals were entered into EPIC at the time of intake.  Patient reported no concerns or questions.

## 2019-02-22 LAB — MICROALBUMIN / CREATININE URINE RATIO
Creatinine, Urine: 70 mg/dL (ref 20–275)
Microalb Creat Ratio: 9 mcg/mg creat (ref <30)
Microalb, Ur: 0.6 mg/dL

## 2019-02-22 LAB — URINE CULTURE
MICRO NUMBER:: 637602
Result:: NO GROWTH
SPECIMEN QUALITY:: ADEQUATE

## 2019-02-22 LAB — URINALYSIS, ROUTINE W REFLEX MICROSCOPIC
Bacteria, UA: NONE SEEN /HPF
Bilirubin Urine: NEGATIVE
Glucose, UA: NEGATIVE
Hgb urine dipstick: NEGATIVE
Hyaline Cast: NONE SEEN /LPF
Ketones, ur: NEGATIVE
Nitrite: NEGATIVE
Protein, ur: NEGATIVE
RBC / HPF: NONE SEEN /HPF (ref 0–2)
Specific Gravity, Urine: 1.009 (ref 1.001–1.03)
pH: 6.5 (ref 5.0–8.0)

## 2019-02-22 LAB — COMPLETE METABOLIC PANEL WITH GFR
AG Ratio: 1.9 (calc) (ref 1.0–2.5)
ALT: 12 U/L (ref 6–29)
AST: 14 U/L (ref 10–35)
Albumin: 4.1 g/dL (ref 3.6–5.1)
Alkaline phosphatase (APISO): 75 U/L (ref 37–153)
BUN: 16 mg/dL (ref 7–25)
CO2: 31 mmol/L (ref 20–32)
Calcium: 10.3 mg/dL (ref 8.6–10.4)
Chloride: 100 mmol/L (ref 98–110)
Creat: 0.95 mg/dL (ref 0.50–0.99)
GFR, Est African American: 71 mL/min/{1.73_m2} (ref >=60)
GFR, Est Non African American: 62 mL/min/{1.73_m2} (ref >=60)
Globulin: 2.2 g/dL (calc) (ref 1.9–3.7)
Glucose, Bld: 115 mg/dL — ABNORMAL HIGH (ref 65–99)
Potassium: 5.4 mmol/L — ABNORMAL HIGH (ref 3.5–5.3)
Sodium: 140 mmol/L (ref 135–146)
Total Bilirubin: 0.4 mg/dL (ref 0.2–1.2)
Total Protein: 6.3 g/dL (ref 6.1–8.1)

## 2019-02-22 LAB — CREATININE, URINE, 24 HOUR: Creatinine, 24H Ur: 0.78 g/(24.h) (ref 0.50–2.15)

## 2019-02-22 LAB — PROTEIN, URINE, 24 HOUR: Protein, 24H Urine: 76 mg/24 h (ref 0–149)

## 2019-02-24 ENCOUNTER — Encounter: Payer: Self-pay | Admitting: *Deleted

## 2019-02-28 ENCOUNTER — Other Ambulatory Visit: Payer: Self-pay | Admitting: Internal Medicine

## 2019-02-28 DIAGNOSIS — E113512 Type 2 diabetes mellitus with proliferative diabetic retinopathy with macular edema, left eye: Secondary | ICD-10-CM | POA: Diagnosis not present

## 2019-02-28 DIAGNOSIS — H35372 Puckering of macula, left eye: Secondary | ICD-10-CM | POA: Diagnosis not present

## 2019-03-01 DIAGNOSIS — E113511 Type 2 diabetes mellitus with proliferative diabetic retinopathy with macular edema, right eye: Secondary | ICD-10-CM | POA: Diagnosis not present

## 2019-04-05 DIAGNOSIS — E113511 Type 2 diabetes mellitus with proliferative diabetic retinopathy with macular edema, right eye: Secondary | ICD-10-CM | POA: Diagnosis not present

## 2019-04-05 DIAGNOSIS — H35371 Puckering of macula, right eye: Secondary | ICD-10-CM | POA: Diagnosis not present

## 2019-04-11 ENCOUNTER — Other Ambulatory Visit: Payer: Self-pay | Admitting: Adult Health

## 2019-04-14 ENCOUNTER — Encounter: Payer: Self-pay | Admitting: Adult Health

## 2019-04-14 ENCOUNTER — Other Ambulatory Visit: Payer: Self-pay

## 2019-04-14 ENCOUNTER — Ambulatory Visit (INDEPENDENT_AMBULATORY_CARE_PROVIDER_SITE_OTHER): Payer: Medicare Other | Admitting: Adult Health

## 2019-04-14 VITALS — BP 134/78 | HR 72 | Temp 97.3°F | Ht 63.0 in | Wt 169.0 lb

## 2019-04-14 DIAGNOSIS — R300 Dysuria: Secondary | ICD-10-CM

## 2019-04-14 MED ORDER — SULFAMETHOXAZOLE-TRIMETHOPRIM 800-160 MG PO TABS: 1.0000 | ORAL_TABLET | Freq: Two times a day (BID) | ORAL | 0 refills | Status: DC

## 2019-04-14 NOTE — Progress Notes (Signed)
Assessment and Plan:  Grace [name] was seen today for dysuria.  Diagnoses and all orders for this visit:  Dysuria UTI versus OAB versus vaginal dryness - will check UA, C&S. Will start ABX now due to pain -     Urinalysis w microscopic + reflex cultur -     sulfamethoxazole-trimethoprim (BACTRIM DS) 800-160 MG tablet; Take 1 tablet by mouth 2 (two) times daily.  Further disposition pending results of labs. Discussed med's effects and SE's.   Over 15 minutes of exam, counseling, chart review, and critical decision making was performed.   Future Appointments  Date Time Provider [place]  05/03/2019  9:00 AM Unk Pinto, MD [facility] None  08/09/2019 11:15 AM Vicie Mutters, PA-C [facility] None  01/19/2020 11:15 AM Suzzanne Cloud, NP [facility] None    ------------------------------------------------------------------------------------------------------------------   HPI BP 134/78   Pulse 72   Temp (!) 97.3 F (36.3 C)   Ht 5\' 3"  (1.6 m)   Wt 169 lb (76.7 kg)   SpO2 99%   BMI 29.94 kg/m   68 y.o.female presents for evaluation of possible UTI; she reports 4 days of dysuria, frequency, urgency; she denies changes in urine character, fever/chills, pelvic pain, flank pain. She denies vaginal discharge, not sexually active. She is s/p TAH.  She reports this feels typical for UTI for her; last estimates 1 year ago; last + culture showed no resistance at that time with e. Coli bacteria.    Lab Results  Component Value Date   GFRNONAA 62 02/21/2019       Past Medical History:  Diagnosis Date  . Abnormality of gait 07/28/2013  . Diabetes ([place])   . Diabetic peripheral neuropathy ([place])   . Diabetic retinopathy ([facility])   . Foot drop, bilateral 07/31/2014  . Peripheral edema   . Polyneuropathy in diabetes(357.2) 07/28/2013     Allergies  Allergen Reactions  . Gabapentin Swelling    Swelling in legs and feet    Current Outpatient Medications on File Prior to Visit   Medication Sig  . Calcium Citrate-Vitamin D (CITRACAL/VITAMIN D PO) Take 1 tablet by mouth daily.  . Cholecalciferol (VITAMIN D PO) Take 1,000 Units by mouth 2 (two) times daily. Reported on 02/07/2016  . CHROMIUM-CINNAMON PO Take by mouth daily.  . DULoxetine (CYMBALTA) 30 MG capsule TAKE 1 CAPSULE BY MOUTH EVERY DAY  . glimepiride (AMARYL) 2 MG tablet TAKE 1/2 TO 1 TABLET BY MOUTH DAILY FOR DIABETES  . Magnesium 250 MG TABS Take 2 tablets by mouth daily.   . metFORMIN (GLUCOPHAGE-XR) 500 MG 24 hr tablet Take 2 tablets 2 x /day with meals for Diabetes   No current facility-administered medications on file prior to visit.     ROS: all negative except above.   Physical Exam:  BP 134/78   Pulse 72   Temp (!) 97.3 F (36.3 C)   Ht 5\' 3"  (1.6 m)   Wt 169 lb (76.7 kg)   SpO2 99%   BMI 29.94 kg/m   General Appearance: Well nourished, in no apparent distress. Eyes: conjunctiva no swelling or erythema ENT/Mouth: Hearing normal.  Neck: Supple Respiratory: Respiratory effort normal, BS equal bilaterally without rales, rhonchi, wheezing or stridor.  Cardio: RRR with no MRGs. Brisk peripheral pulses without edema.  Abdomen: Soft, + BS.  Non tender, no guarding, rebound, hernias, masses. Musculoskeletal: slow gait with walker  Skin: Warm, dry without rashes, lesions, ecchymosis.  Neuro: Normal muscle tone Psych: Awake and oriented X 3, normal affect,  Insight and Judgment appropriate.     Izora Ribas, NP 9:59 AM [facility]

## 2019-04-14 NOTE — Patient Instructions (Signed)
Dysuria Dysuria is pain or discomfort while urinating. The pain or discomfort may be felt in the part of your body that drains urine from the bladder (urethra) or in the surrounding tissue of the genitals. The pain may also be felt in the groin area, lower abdomen, or lower back. You may have to urinate frequently or have the sudden feeling that you have to urinate (urgency). Dysuria can affect both men and women, but it is more common in women. Dysuria can be caused by many different things, including:  Urinary tract infection.  Kidney stones or bladder stones.  Certain sexually transmitted infections (STIs), such as chlamydia.  Dehydration.  Inflammation of the tissues of the vagina.  Use of certain medicines.  Use of certain soaps or scented products that cause irritation. Follow these instructions at home: General instructions  Watch your condition for any changes.  Urinate often. Avoid holding urine for long periods of time.  After a bowel movement or urination, women should cleanse from front to back, using each tissue only once.  Urinate after sexual intercourse.  Keep all follow-up visits as told by your health care provider. This is important.  If you had any tests done to find the cause of dysuria, it is up to you to get your test results. Ask your health care provider, or the department that is doing the test, when your results will be ready. Eating and drinking   Drink enough fluid to keep your urine pale yellow.  Avoid caffeine, tea, and alcohol. They can irritate the bladder and make dysuria worse. In men, alcohol may irritate the prostate. Medicines  Take over-the-counter and prescription medicines only as told by your health care provider.  If you were prescribed an antibiotic medicine, take it as told by your health care provider. Do not stop taking the antibiotic even if you start to feel better. Contact a health care provider if:  You have a fever.   You develop pain in your back or sides.  You have nausea or vomiting.  You have blood in your urine.  You are not urinating as often as you usually do. Get help right away if:  Your pain is severe and not relieved with medicines.  You cannot eat or drink without vomiting.  You are confused.  You have a rapid heartbeat while at rest.  You have shaking or chills.  You feel extremely weak. Summary  Dysuria is pain or discomfort while urinating. Many different conditions can lead to dysuria.  If you have dysuria, you may have to urinate frequently or have the sudden feeling that you have to urinate (urgency).  Watch your condition for any changes. Keep all follow-up visits as told by your health care provider.  Make sure that you urinate often and drink enough fluid to keep your urine pale yellow. This information is not intended to replace advice given to you by your health care provider. Make sure you discuss any questions you have with your health care provider. Document Released: 05/02/2004 Document Revised: 07/17/2017 Document Reviewed: 05/21/2017 Elsevier Patient Education  [place].    Sulfamethoxazole; Trimethoprim, SMX-TMP oral suspension What is this medicine? SULFAMETHOXAZOLE; TRIMETHOPRIM or SMX-TMP (suhl fuh meth OK suh zohl; trye METH oh prim) is a combination of a sulfonamide antibiotic and a second antibiotic, trimethoprim. It is used to treat or prevent certain kinds of bacterial infections.It will not work for colds, flu, or other viral infections. This medicine may be used for  other purposes; ask your health care provider or pharmacist if you have questions. COMMON BRAND NAME(S): Septra, Sulfatrim, Sulfatrim Pediatric, Sultrex Pediatric What should I tell my health care provider before I take this medicine? They need to know if you have any of these conditions:  anemia  asthma  being treated with anticonvulsants  if you frequently drink  alcohol containing drinks  kidney disease  liver disease  low level of folic acid or [id number] dehydrogenase  poor nutrition or malabsorption  porphyria  severe allergies  thyroid disorder  an unusual or allergic reaction to sulfamethoxazole, trimethoprim, sulfa drugs, other medicines, foods, dyes, or preservatives  pregnant or trying to get pregnant  breast-feeding How should I use this medicine? Take this suspension by mouth. Follow the directions on the prescription label. Shake the bottle well before taking. Use a specially marked spoon or container to measure your medicine. Ask your pharmacist if you do not have one. Household spoons are not accurate. Take your doses at regular intervals. Do not take more medicine than directed. Talk to your pediatrician regarding the use of this medicine in children. Special care may be needed. While this drug may be prescribed for children as young as 63 months of age for selected conditions, precautions do apply. Overdosage: If you think you have taken too much of this medicine contact a poison control center or emergency room at once. NOTE: This medicine is only for you. Do not share this medicine with others. What if I miss a dose? If you miss a dose, take it as soon as you can. If it is almost time for your next dose, take only that dose. Do not take double or extra doses. What may interact with this medicine? Do not take this medicine with any of the following medications  aminobenzoate potassium  dofetilide  metronidazole This medicine may also interact with the following medications  ACE inhibitors like benazepril, enalapril, lisinopril, and ramipril  birth control pills  cyclosporine  digoxin  diuretics  indomethacin  medicines for diabetes  methenamine  methotrexate  phenytoin  potassium supplements  pyrimethamine  sulfinpyrazone  tricyclic antidepressants  warfarin This list may not describe  all possible interactions. Give your health care provider a list of all the medicines, herbs, non-prescription drugs, or dietary supplements you use. Also tell them if you smoke, drink alcohol, or use illegal drugs. Some items may interact with your medicine. What should I watch for while using this medicine? Tell your doctor or health care professional if your symptoms do not improve. Drink several glasses of water a day to reduce the risk of kidney problems. Do not treat diarrhea with over the counter products. Contact your doctor if you have diarrhea that lasts more than 2 days or if it is severe and watery. This medicine can make you more sensitive to the sun. Keep out of the sun. If you cannot avoid being in the sun, wear protective clothing and use a sunscreen. Do not use sun lamps or tanning beds/booths. What side effects may I notice from receiving this medicine? Side effects that you should report to your doctor or health care professional as soon as possible:  allergic reactions like skin rash or hives, swelling of the face, lips, or tongue  breathing problems  fever or chills, sore throat  irregular heartbeat, chest pain  joint or muscle pain  pain or difficulty passing urine  red pinpoint spots on skin  redness, blistering, peeling or loosening of the skin,  including inside the mouth  unusual bleeding or bruising  unusual weakness or tiredness  yellowing of the eyes or skin Side effects that usually do not require medical attention (report to your doctor or health care professional if they continue or are bothersome):  diarrhea  dizziness  headache  loss of appetite  nausea, vomiting  nervousness This list may not describe all possible side effects. Call your doctor for medical advice about side effects. You may report side effects to FDA at 1-800-FDA-1088. Where should I keep my medicine? Keep out of the reach of children. Store at room temperature between 15  and 25 degrees C (59 and 77 degrees F). Protect from light and moisture. Throw away any unused medicine after the expiration date. NOTE: This sheet is a summary. It may not cover all possible information. If you have questions about this medicine, talk to your doctor, pharmacist, or health care provider.  2020 Elsevier/Gold Standard (2013-03-11 14:37:40)

## 2019-04-16 LAB — URINALYSIS W MICROSCOPIC + REFLEX CULTURE
Bacteria, UA: NONE SEEN /HPF
Bilirubin Urine: NEGATIVE
Hgb urine dipstick: NEGATIVE
Hyaline Cast: NONE SEEN /LPF
Nitrites, Initial: NEGATIVE
Protein, ur: NEGATIVE
Specific Gravity, Urine: 1.023 (ref 1.001–1.03)
WBC, UA: >=60 /HPF — AB (ref 0–5)
pH: <=5 (ref 5.0–8.0)

## 2019-04-16 LAB — URINE CULTURE
MICRO NUMBER:: 823192
SPECIMEN QUALITY:: ADEQUATE

## 2019-04-16 LAB — CULTURE INDICATED

## 2019-04-18 DIAGNOSIS — E113512 Type 2 diabetes mellitus with proliferative diabetic retinopathy with macular edema, left eye: Secondary | ICD-10-CM | POA: Diagnosis not present

## 2019-04-18 DIAGNOSIS — E113511 Type 2 diabetes mellitus with proliferative diabetic retinopathy with macular edema, right eye: Secondary | ICD-10-CM | POA: Diagnosis not present

## 2019-04-19 DIAGNOSIS — L738 Other specified follicular disorders: Secondary | ICD-10-CM | POA: Diagnosis not present

## 2019-04-19 DIAGNOSIS — L821 Other seborrheic keratosis: Secondary | ICD-10-CM | POA: Diagnosis not present

## 2019-05-03 ENCOUNTER — Encounter: Payer: Self-pay | Admitting: Internal Medicine

## 2019-05-03 ENCOUNTER — Other Ambulatory Visit: Payer: Self-pay

## 2019-05-03 ENCOUNTER — Ambulatory Visit (INDEPENDENT_AMBULATORY_CARE_PROVIDER_SITE_OTHER): Payer: Medicare Other | Admitting: Internal Medicine

## 2019-05-03 VITALS — BP 116/82 | HR 76 | Temp 97.5°F | Resp 16 | Ht 64.5 in | Wt 168.2 lb

## 2019-05-03 DIAGNOSIS — E1122 Type 2 diabetes mellitus with diabetic chronic kidney disease: Secondary | ICD-10-CM | POA: Diagnosis not present

## 2019-05-03 DIAGNOSIS — E785 Hyperlipidemia, unspecified: Secondary | ICD-10-CM

## 2019-05-03 DIAGNOSIS — Z79899 Other long term (current) drug therapy: Secondary | ICD-10-CM | POA: Diagnosis not present

## 2019-05-03 DIAGNOSIS — I10 Essential (primary) hypertension: Secondary | ICD-10-CM | POA: Diagnosis not present

## 2019-05-03 DIAGNOSIS — Z8249 Family history of ischemic heart disease and other diseases of the circulatory system: Secondary | ICD-10-CM

## 2019-05-03 DIAGNOSIS — E1169 Type 2 diabetes mellitus with other specified complication: Secondary | ICD-10-CM

## 2019-05-03 DIAGNOSIS — Z1211 Encounter for screening for malignant neoplasm of colon: Secondary | ICD-10-CM

## 2019-05-03 DIAGNOSIS — E782 Mixed hyperlipidemia: Secondary | ICD-10-CM

## 2019-05-03 DIAGNOSIS — N183 Chronic kidney disease, stage 3 unspecified: Secondary | ICD-10-CM

## 2019-05-03 DIAGNOSIS — E559 Vitamin D deficiency, unspecified: Secondary | ICD-10-CM

## 2019-05-03 DIAGNOSIS — Z136 Encounter for screening for cardiovascular disorders: Secondary | ICD-10-CM

## 2019-05-03 DIAGNOSIS — N390 Urinary tract infection, site not specified: Secondary | ICD-10-CM

## 2019-05-03 DIAGNOSIS — E0842 Diabetes mellitus due to underlying condition with diabetic polyneuropathy: Secondary | ICD-10-CM

## 2019-05-03 NOTE — Patient Instructions (Signed)

## 2019-05-03 NOTE — Progress Notes (Signed)
Comprehensive Evaluation &  Examination     This very nice 68 y.o. MWF presents for a  comprehensive evaluation and management of multiple medical co-morbidities.  Patient has been followed for HTN, HLD, T2_NIDDM w /PN& CKD3, Diab Retinopathy,wet AMD and Vitamin D Deficiency.      HTN predates since the 1990's, managed on diuretic therapy. Patient's BP has been controlled at home and patient denies any cardiac symptoms as chest pain, palpitations, shortness of breath, dizziness or ankle swelling. Today's BP is at goal - 116/82.      Patient's hyperlipidemia is not controlled with diet and medications. Patient denies myalgias or other medication SE's. Last lipids were not at goal: Lab Results  Component Value Date   CHOL 204 (H) 01/27/2019   HDL 58 01/27/2019   LDLCALC 116 (H) 01/27/2019   TRIG 188 (H) 01/27/2019   CHOLHDL 3.5 01/27/2019      Patient has hx/o Gestational Diabetes in the 1980, going in a Honeymoon phase until dx'd w/ T2_NIDDM in 2006 when started on Metformin. Patient denies reactive hypoglycemic symptoms  or diabetic polys.  Patient is also followed by Dr Jannifer Franklin for painful Diabetic peripheral Neuropathyand she has bilat ankle braces for Bilateral foot drop. Pain is improved on Duloxetine. Patient is also followed very closely by Dr [name] for "Wet" MD receiving IO Avastin injections. Last A1c was not at goal: Lab Results  Component Value Date   HGBA1C 7.5 (H) 01/27/2019      Finally, patient has history of Vitamin D Deficiency ("26" / 2016)  and last Vitamin D was at goal:  Lab Results  Component Value Date   VD25OH 59 10/26/2018   Current Outpatient Medications on File Prior to Visit  Medication Sig  . Calcium Citrate-Vitamin D (CITRACAL/VITAMIN D PO) Take 1 tablet by mouth daily.  . Cholecalciferol (VITAMIN D PO) Take 1,000 Units by mouth 2 (two) times daily. Reported on 02/07/2016  . CHROMIUM-CINNAMON PO Take by mouth daily.  . DULoxetine (CYMBALTA) 30 MG capsule  TAKE 1 CAPSULE BY MOUTH EVERY DAY  . glimepiride (AMARYL) 2 MG tablet TAKE 1/2 TO 1 TABLET BY MOUTH DAILY FOR DIABETES  . Magnesium 250 MG TABS Take 2 tablets by mouth daily.   . metFORMIN (GLUCOPHAGE-XR) 500 MG 24 hr tablet Take 2 tablets 2 x /day with meals for Diabetes   No current facility-administered medications on file prior to visit.    Allergies  Allergen Reactions  . Gabapentin Swelling    Swelling in legs and feet   Past Medical History:  Diagnosis Date  . Abnormality of gait 07/28/2013  . Diabetes ([place])   . Diabetic peripheral neuropathy ([place])   . Diabetic retinopathy ([place])   . Foot drop, bilateral 07/31/2014  . Peripheral edema   . Polyneuropathy in diabetes(357.2) 07/28/2013   Health Maintenance  Topic Date Due  . DEXA SCAN  05/15/2015  . MAMMOGRAM  04/18/2018  . INFLUENZA VACCINE  03/19/2019  . OPHTHALMOLOGY EXAM  06/02/2019  . HEMOGLOBIN A1C  07/29/2019  . URINE MICROALBUMIN  02/21/2020  . FOOT EXAM  05/02/2020  . Fecal DNA (Cologuard)  04/21/2021  . TETANUS/TDAP  02/06/2026  . PNA vac Low Risk Adult  Completed   Immunization History  Administered Date(s) Administered  . Influenza, High Dose Seasonal PF 05/12/2016, 07/03/2017, 05/24/2018  . Influenza-Unspecified 07/03/2017  . Pneumococcal Conjugate-13 02/07/2016  . Pneumococcal Polysaccharide-23 09/03/2017  . Td 02/07/2016   - Cologard - 04/21/2018 - Negative  - 3  yr f/u due Sept 2022  Last MGM - 01/02-17 - rescheduled due to Covid-19   Past Surgical History:  Procedure Laterality Date  . ABDOMINAL HYSTERECTOMY    . ANKLE FRACTURE SURGERY     left  . CATARACT EXTRACTION, BILATERAL    . HIP SURGERY     right  . TONSILLECTOMY     Family History  Problem Relation Age of Onset  . Diabetes Mother   . Diabetes Brother   . Neuropathy Neg Hx    Social History   Tobacco Use  . Smoking status: Never Smoker  . Smokeless tobacco: Never Used  Substance Use Topics  . Alcohol use: No  . Drug use:  No    ROS Constitutional: Denies fever, chills, weight loss/gain, headaches, insomnia,  night sweats, and change in appetite. Does c/o fatigue. Eyes: Denies redness, blurred vision, diplopia, discharge, itchy, watery eyes.  ENT: Denies discharge, congestion, post nasal drip, epistaxis, sore throat, earache, hearing loss, dental pain, Tinnitus, Vertigo, Sinus pain, snoring.  Cardio: Denies chest pain, palpitations, irregular heartbeat, syncope, dyspnea, diaphoresis, orthopnea, PND, claudication, edema Respiratory: denies cough, dyspnea, DOE, pleurisy, hoarseness, laryngitis, wheezing.  Gastrointestinal: Denies dysphagia, heartburn, reflux, water brash, pain, cramps, nausea, vomiting, bloating, diarrhea, constipation, hematemesis, melena, hematochezia, jaundice, hemorrhoids Genitourinary: Denies dysuria, frequency, urgency, nocturia, hesitancy, discharge, hematuria, flank pain Breast: Breast lumps, nipple discharge, bleeding.  Musculoskeletal: Denies arthralgia, myalgia, stiffness, Jt. Swelling, pain, limp, and strain/sprain. Denies falls. Skin: Denies puritis, rash, hives, warts, acne, eczema, changing in skin lesion Neuro: No weakness, tremor, incoordination, spasms, paresthesia, pain Psychiatric: Denies confusion, memory loss, sensory loss. Denies Depression. Endocrine: Denies change in weight, skin, hair change, nocturia, and paresthesia, diabetic polys, visual blurring, hyper / hypo glycemic episodes.  Heme/Lymph: No excessive bleeding, bruising, enlarged lymph nodes.  Physical Exam  BP 116/82   Pulse 76   Temp (!) 97.5 F (36.4 C)   Resp 16   Ht 5' 4.5" (1.638 m)   Wt 168 lb 3.2 oz (76.3 kg)   BMI 28.43 kg/m   General Appearance: Well nourished, well groomed and in no apparent distress.  Eyes: PERRLA, EOMs, conjunctiva no swelling or erythema, normal fundi and vessels. Sinuses: No frontal/maxillary tenderness ENT/Mouth: EACs patent / TMs  nl. Nares clear without erythema,  swelling, mucoid exudates. Oral hygiene is good. No erythema, swelling, or exudate. Tongue normal, non-obstructing. Tonsils not swollen or erythematous. Hearing normal.  Neck: Supple, thyroid not palpable. No bruits, nodes or JVD. Respiratory: Respiratory effort normal.  BS equal and clear bilateral without rales, rhonci, wheezing or stridor. Cardio: Heart sounds are normal with regular rate and rhythm and no murmurs, rubs or gallops. Peripheral pulses are normal and equal bilaterally without edema. No aortic or femoral bruits. Chest: symmetric with normal excursions and percussion. Breasts: Symmetric, without lumps, nipple discharge, retractions, or fibrocystic changes.  Abdomen: Flat, soft with bowel sounds active. Nontender, no guarding, rebound, hernias, masses, or organomegaly.  Lymphatics: Non tender without lymphadenopathy.   Musculoskeletal:  Has bilat ankle braces with broad base gait. Skin: Warm and dry without rashes, lesions, cyanosis, clubbing or  ecchymosis.  Neuro: Cranial nerves intact, reflexes equal bilaterally. Normal muscle tone, no cerebellar symptoms. Sensation decreased  to touch, vibratory and Monofilament to the toes bilaterally. Pysch: Alert and oriented X 3, normal affect, Insight and Judgment appropriate.   Assessment and Plan  1. Essential hypertension  - EKG 12-Lead - CBC with Differential/Platelet - COMPLETE METABOLIC PANEL WITH GFR - Magnesium - TSH  2. Hyperlipidemia associated with type 2 diabetes mellitus ([place])  - EKG 12-Lead - Lipid panel - TSH  3. Hyperlipidemia, mixed  - EKG 12-Lead  4. Vitamin D deficiency  - VITAMIN D 25 Hydroxyl  5. Type 2 diabetes mellitus with stage 3 chronic kidney disease, without long-term current use of insulin (HCC)  - EKG 12-Lead - Urinalysis, Routine w reflex microscopic - Microalbumin / creatinine urine ratio - HM DIABETES FOOT EXAM - LOW EXTREMITY NEUR EXAM DOCUM - Hemoglobin A1c - Insulin, random  6.  Diabetic polyneuropathy associated with diabetes mellitus due to underlying condition ([place])    7. Recurrent UTI  - Urinalysis, Routine w reflex microscopic - Urine Culture  8. Screening for colorectal cancer  - POC Hemoccult Bld/Stl   9. Screening for ischemic heart disease  - EKG 12-Lead  10. FH: hypertension  - EKG 12-Lead  11. Medication management  - Urinalysis, Routine w reflex microscopic - Microalbumin / creatinine urine ratio - CBC with Differential/Platelet - COMPLETE METABOLIC PANEL WITH GFR - Magnesium - Lipid panel - TSH - Hemoglobin A1c - Insulin, random          Patient was counseled in prudent diet to achieve/maintain BMI less than 25 for weight control, BP monitoring, regular exercise and medications. Discussed med's effects and SE's. Screening labs and tests as requested with regular follow-up as recommended. Over 40 minutes of exam, counseling, chart review and high complex critical decision making was performed.   Kirtland Bouchard, MD

## 2019-05-04 ENCOUNTER — Other Ambulatory Visit: Payer: Self-pay | Admitting: Internal Medicine

## 2019-05-04 DIAGNOSIS — E782 Mixed hyperlipidemia: Secondary | ICD-10-CM

## 2019-05-04 MED ORDER — ROSUVASTATIN CALCIUM 20 MG PO TABS: ORAL_TABLET | ORAL | 1 refills | Status: DC

## 2019-05-05 ENCOUNTER — Other Ambulatory Visit: Payer: Self-pay | Admitting: Internal Medicine

## 2019-05-05 ENCOUNTER — Encounter: Payer: Self-pay | Admitting: *Deleted

## 2019-05-05 DIAGNOSIS — N390 Urinary tract infection, site not specified: Secondary | ICD-10-CM

## 2019-05-05 LAB — CBC WITH DIFFERENTIAL/PLATELET
Absolute Monocytes: 551 cells/uL (ref 200–950)
Basophils Absolute: 20 cells/uL (ref 0–200)
Basophils Relative: 0.3 %
Eosinophils Absolute: 129 cells/uL (ref 15–500)
Eosinophils Relative: 1.9 %
HCT: 38.2 % (ref 35.0–45.0)
Hemoglobin: 12.4 g/dL (ref 11.7–15.5)
Lymphs Abs: 2346 cells/uL (ref 850–3900)
MCH: 30.4 pg (ref 27.0–33.0)
MCHC: 32.5 g/dL (ref 32.0–36.0)
MCV: 93.6 fL (ref 80.0–100.0)
MPV: 9.2 fL (ref 7.5–12.5)
Monocytes Relative: 8.1 %
Neutro Abs: 3754 cells/uL (ref 1500–7800)
Neutrophils Relative %: 55.2 %
Platelets: 423 10*3/uL — ABNORMAL HIGH (ref 140–400)
RBC: 4.08 10*6/uL (ref 3.80–5.10)
RDW: 13.3 % (ref 11.0–15.0)
Total Lymphocyte: 34.5 %
WBC: 6.8 10*3/uL (ref 3.8–10.8)

## 2019-05-05 LAB — COMPLETE METABOLIC PANEL WITH GFR
AG Ratio: 2 (calc) (ref 1.0–2.5)
ALT: 14 U/L (ref 6–29)
AST: 13 U/L (ref 10–35)
Albumin: 4.3 g/dL (ref 3.6–5.1)
Alkaline phosphatase (APISO): 80 U/L (ref 37–153)
BUN/Creatinine Ratio: 23 (calc) — ABNORMAL HIGH (ref 6–22)
BUN: 24 mg/dL (ref 7–25)
CO2: 32 mmol/L (ref 20–32)
Calcium: 10.4 mg/dL (ref 8.6–10.4)
Chloride: 98 mmol/L (ref 98–110)
Creat: 1.04 mg/dL — ABNORMAL HIGH (ref 0.50–0.99)
GFR, Est African American: 64 mL/min/{1.73_m2} (ref >=60)
GFR, Est Non African American: 55 mL/min/{1.73_m2} — ABNORMAL LOW (ref >=60)
Globulin: 2.2 g/dL (calc) (ref 1.9–3.7)
Glucose, Bld: 129 mg/dL — ABNORMAL HIGH (ref 65–99)
Potassium: 5 mmol/L (ref 3.5–5.3)
Sodium: 139 mmol/L (ref 135–146)
Total Bilirubin: 0.4 mg/dL (ref 0.2–1.2)
Total Protein: 6.5 g/dL (ref 6.1–8.1)

## 2019-05-05 LAB — URINE CULTURE
MICRO NUMBER:: 884228
SPECIMEN QUALITY:: ADEQUATE

## 2019-05-05 LAB — HEMOGLOBIN A1C
Hgb A1c MFr Bld: 7.1 % of total Hgb — ABNORMAL HIGH (ref <5.7)
Mean Plasma Glucose: 157 (calc)
eAG (mmol/L): 8.7 (calc)

## 2019-05-05 LAB — URINALYSIS, ROUTINE W REFLEX MICROSCOPIC
Bacteria, UA: NONE SEEN /HPF
Bilirubin Urine: NEGATIVE
Glucose, UA: NEGATIVE
Hgb urine dipstick: NEGATIVE
Hyaline Cast: NONE SEEN /LPF
Ketones, ur: NEGATIVE
Nitrite: NEGATIVE
Protein, ur: NEGATIVE
RBC / HPF: NONE SEEN /HPF (ref 0–2)
Specific Gravity, Urine: 1.019 (ref 1.001–1.03)
pH: 6 (ref 5.0–8.0)

## 2019-05-05 LAB — LIPID PANEL
Cholesterol: 221 mg/dL — ABNORMAL HIGH (ref <200)
HDL: 57 mg/dL (ref >=50)
LDL Cholesterol (Calc): 130 mg/dL (calc) — ABNORMAL HIGH
Non-HDL Cholesterol (Calc): 164 mg/dL (calc) — ABNORMAL HIGH (ref <130)
Total CHOL/HDL Ratio: 3.9 (calc) (ref <5.0)
Triglycerides: 207 mg/dL — ABNORMAL HIGH (ref <150)

## 2019-05-05 LAB — MAGNESIUM: Magnesium: 1.5 mg/dL (ref 1.5–2.5)

## 2019-05-05 LAB — VITAMIN D 25 HYDROXY (VIT D DEFICIENCY, FRACTURES): Vit D, 25-Hydroxy: 85 ng/mL (ref 30–100)

## 2019-05-05 LAB — MICROALBUMIN / CREATININE URINE RATIO
Creatinine, Urine: 126 mg/dL (ref 20–275)
Microalb Creat Ratio: 2 mcg/mg creat (ref <30)
Microalb, Ur: 0.3 mg/dL

## 2019-05-05 LAB — TSH: TSH: 2.34 mIU/L (ref 0.40–4.50)

## 2019-05-05 LAB — INSULIN, RANDOM: Insulin: 7 u[IU]/mL

## 2019-05-05 MED ORDER — AMOXICILLIN 250 MG PO CAPS: ORAL_CAPSULE | ORAL | 0 refills | Status: DC

## 2019-05-10 DIAGNOSIS — E113511 Type 2 diabetes mellitus with proliferative diabetic retinopathy with macular edema, right eye: Secondary | ICD-10-CM | POA: Diagnosis not present

## 2019-05-10 DIAGNOSIS — H35371 Puckering of macula, right eye: Secondary | ICD-10-CM | POA: Diagnosis not present

## 2019-05-19 DIAGNOSIS — L738 Other specified follicular disorders: Secondary | ICD-10-CM | POA: Diagnosis not present

## 2019-06-06 DIAGNOSIS — E113512 Type 2 diabetes mellitus with proliferative diabetic retinopathy with macular edema, left eye: Secondary | ICD-10-CM | POA: Diagnosis not present

## 2019-06-10 DIAGNOSIS — Z23 Encounter for immunization: Secondary | ICD-10-CM | POA: Diagnosis not present

## 2019-06-21 DIAGNOSIS — E113511 Type 2 diabetes mellitus with proliferative diabetic retinopathy with macular edema, right eye: Secondary | ICD-10-CM | POA: Diagnosis not present

## 2019-06-21 DIAGNOSIS — H35371 Puckering of macula, right eye: Secondary | ICD-10-CM | POA: Diagnosis not present

## 2019-06-23 ENCOUNTER — Other Ambulatory Visit: Payer: Self-pay

## 2019-06-23 ENCOUNTER — Ambulatory Visit (INDEPENDENT_AMBULATORY_CARE_PROVIDER_SITE_OTHER): Payer: Medicare Other | Admitting: *Deleted

## 2019-06-23 VITALS — BP 118/76 | HR 72 | Temp 97.9°F | Resp 16 | Wt 172.2 lb

## 2019-06-23 DIAGNOSIS — N390 Urinary tract infection, site not specified: Secondary | ICD-10-CM | POA: Diagnosis not present

## 2019-06-23 NOTE — Progress Notes (Signed)
Patient is here for a NV to recheck a urinalysis and urine culture.  The patient has completed her RX for Amoxil and is having no symptoms.

## 2019-06-24 DIAGNOSIS — N390 Urinary tract infection, site not specified: Secondary | ICD-10-CM | POA: Diagnosis not present

## 2019-06-26 LAB — URINE CULTURE
MICRO NUMBER:: 1074163
SPECIMEN QUALITY:: ADEQUATE

## 2019-06-26 LAB — URINALYSIS, ROUTINE W REFLEX MICROSCOPIC
Bacteria, UA: NONE SEEN /HPF
Bilirubin Urine: NEGATIVE
Glucose, UA: NEGATIVE
Hgb urine dipstick: NEGATIVE
Hyaline Cast: NONE SEEN /LPF
Ketones, ur: NEGATIVE
Nitrite: NEGATIVE
Protein, ur: NEGATIVE
RBC / HPF: NONE SEEN /HPF (ref 0–2)
Specific Gravity, Urine: 1.007 (ref 1.001–1.03)
WBC, UA: NONE SEEN /HPF (ref 0–5)
pH: 7.5 (ref 5.0–8.0)

## 2019-07-18 DIAGNOSIS — E113511 Type 2 diabetes mellitus with proliferative diabetic retinopathy with macular edema, right eye: Secondary | ICD-10-CM | POA: Diagnosis not present

## 2019-07-18 DIAGNOSIS — H35372 Puckering of macula, left eye: Secondary | ICD-10-CM | POA: Diagnosis not present

## 2019-07-18 DIAGNOSIS — H35371 Puckering of macula, right eye: Secondary | ICD-10-CM | POA: Diagnosis not present

## 2019-07-18 DIAGNOSIS — E113512 Type 2 diabetes mellitus with proliferative diabetic retinopathy with macular edema, left eye: Secondary | ICD-10-CM | POA: Diagnosis not present

## 2019-07-29 ENCOUNTER — Ambulatory Visit: Payer: Self-pay | Admitting: Physician Assistant

## 2019-08-02 DIAGNOSIS — E113511 Type 2 diabetes mellitus with proliferative diabetic retinopathy with macular edema, right eye: Secondary | ICD-10-CM | POA: Diagnosis not present

## 2019-08-02 DIAGNOSIS — E113512 Type 2 diabetes mellitus with proliferative diabetic retinopathy with macular edema, left eye: Secondary | ICD-10-CM | POA: Diagnosis not present

## 2019-08-07 DIAGNOSIS — E1151 Type 2 diabetes mellitus with diabetic peripheral angiopathy without gangrene: Secondary | ICD-10-CM | POA: Insufficient documentation

## 2019-08-07 DIAGNOSIS — E11311 Type 2 diabetes mellitus with unspecified diabetic retinopathy with macular edema: Secondary | ICD-10-CM | POA: Insufficient documentation

## 2019-08-07 DIAGNOSIS — E1169 Type 2 diabetes mellitus with other specified complication: Secondary | ICD-10-CM | POA: Insufficient documentation

## 2019-08-07 NOTE — Progress Notes (Signed)
MEDICARE ANNUAL WELLNESS VISIT AND FOLLOW UP  Assessment:   Encounter for Medicare annual wellness exam 1 year Has MGM scheduled  Diabetic polyneuropathy associated with diabetes mellitus due to underlying condition ([place]) -     glucose blood test strip; Test sugar 3 x a day. DX DM with hyperlipidemia -     Hemoglobin A1c ([facility]) Continue follow up  Type 2 diabetes mellitus with stage 2 chronic kidney disease, without long-term current use of insulin (HCC) -     glucose blood test strip; Test sugar 3 x a day. DX DM with hyperlipidemia -     COMPLETE METABOLIC PANEL WITH GFR -     Hemoglobin A1c ([facility]) Increase fluids, avoid NSAIDS, monitor sugars, will monitor  Type 2 diabetes mellitus with hyperlipidemia (H[facility]) -     glucose blood test strip; Test sugar 3 x a day. DX DM with hyperlipidemia -     Lipid Profile -     Hemoglobin A1c ([facility]) check lipids- STRONGLY suggest getting on low dose statin, patient declines at this time, will consider.  decrease fatty foods increase activity.   Diabetes mellitus type 2 with peripheral artery disease (HCC) -     glucose blood test strip; Test sugar 3 x a day. DX DM with hyperlipidemia -     Hemoglobin A1c ([facility]) - suggest repeat of ABI, patient declines at this time.   Diabetes mellitus with macular edema (HCC) -     glucose blood test strip; Test sugar 3 x a day. DX DM with hyperlipidemia -     Hemoglobin A1c ([facility]) - continue follow up every 6 weeks.   Medication management -     CBC with Diff -     COMPLETE METABOLIC PANEL WITH GFR -     Magnesium  Mixed hyperlipidemia -     Lipid Profile  Essential hypertension -     TSH - continue medications, DASH diet, exercise and monitor at home. Call if greater than 130/80.  Foot drop, bilateral Has leg braces and follows with ortho  Vitamin D deficiency -     Vitamin D (25 hydroxy)  Fatty liver Check labs, avoid tylenol, alcohol, weight loss advised.   Macular  degeneration, unspecified laterality, unspecified type Continue follow up  Estradiol deficiency -     DG Bone Density; Future   Over 30 minutes of exam, counseling, chart review, and critical decision making was performed  Future Appointments  Date Time Provider [place]  08/09/2019  4:10 PM [facility] MM 2 [facility]  11/10/2019 10:30 AM Unk Pinto, MD [facility] None  01/19/2020 11:15 AM Suzzanne Cloud, NP [facility] None  05/23/2020 11:00 AM Unk Pinto, MD [facility] None    Plan:   During the course of the visit the patient was educated and counseled about appropriate screening and preventive services including:    Pneumococcal vaccine   Influenza vaccine  Td vaccine  Prevnar 13  Screening electrocardiogram  Screening mammography  Bone densitometry screening  Colorectal cancer screening  Diabetes screening  Glaucoma screening  Nutrition counseling   Advanced directives: given info/requested copies   Subjective:   Grace [name] is a 68 y.o. female who presents for Medicare Annual Wellness Visit and 3 month follow up on hypertension, diabetes, hyperlipidemia, vitamin D def.   She is interested in COVID vaccine when it becomes available.   BMI is Body mass index is 28.52 kg/m., she is working on diet and exercise. Wt Readings from  Last 3 Encounters:  08/09/19 171 lb 6.4 oz (77.7 kg)  06/23/19 172 lb 3.2 oz (78.1 kg)  05/03/19 168 lb 3.2 oz (76.3 kg)    Her blood pressure is not checked at home, today their BP is BP: 120/80 She does not workout. She denies chest pain, shortness of breath, dizziness.   She does have a history of diabetes managed with medication, diet, and exercise.  With PAD had ABI in 2015 that showed mild disease, not on ASA due to Mac Degen She has CKD She has neuropathy which is extensive and requires her to use a walker to avoid falls, she is on cymbalta for neuropathy. She has braces on her legs to help  prevent falls, no falls in past year but she is high risk.  With hyperlipidemia not at goal- long discussion about statins, will check today and see where we are at She has macular degeneration and edema following with Dr. [name], not on HCTZ due to this She does not have a meter at this time, needs a new one.  Has accucheck she is on 4 metformin a day   1 amaryl a day with largest meal no low blood sugar.    Lab Results  Component Value Date   HGBA1C 7.1 (H) 05/03/2019   Last GFR Lab Results  Component Value Date   GFRNONAA 55 (L) 05/03/2019   Lab Results  Component Value Date   CHOL 221 (H) 05/03/2019   HDL 57 05/03/2019   LDLCALC 130 (H) 05/03/2019   TRIG 207 (H) 05/03/2019   CHOLHDL 3.9 05/03/2019    Patient is on Vitamin D supplement. Lab Results  Component Value Date   VD25OH 85 05/03/2019      Medication Review  Current Outpatient Medications (Endocrine & Metabolic):  .  glimepiride (AMARYL) 2 MG tablet, Take 1 tablet 1 or 2 x /day with Meals for Diabetes .  metFORMIN (GLUCOPHAGE-XR) 500 MG 24 hr tablet, Take 2 tablets 2 x /day with meals for Diabetes      Current Outpatient Medications (Other):  Marland Kitchen  Calcium Citrate-Vitamin D (CITRACAL/VITAMIN D PO), Take 1 tablet by mouth daily. .  Cholecalciferol (VITAMIN D PO), Take 1,000 Units by mouth 2 (two) times daily. Reported on 02/07/2016 .  CHROMIUM-CINNAMON PO, Take by mouth daily. .  DULoxetine (CYMBALTA) 30 MG capsule, TAKE 1 CAPSULE BY MOUTH EVERY DAY .  Magnesium 250 MG TABS, Take 2 tablets by mouth daily.  Marland Kitchen  glucose blood test strip, Test sugar 3 x a day. DX DM with hyperlipidemia  Allergies: Allergies  Allergen Reactions  . Gabapentin Swelling    Swelling in legs and feet    Current Problems (verified) has Diabetic polyneuropathy ([place]); Foot drop, bilateral; T2_NIDDM; Mixed hyperlipidemia; Vitamin D deficiency; Medication management; Essential hypertension; Macular degeneration; Fatty liver;  Cholelithiases; Type 2 diabetes mellitus with hyperlipidemia ([place]); Diabetes mellitus type 2 with peripheral artery disease ([place]); and Diabetes mellitus with macular edema ([place]) on their problem list.  Screening Tests Immunization History  Administered Date(s) Administered  . Influenza, High Dose Seasonal PF 05/12/2016, 07/03/2017, 05/24/2018, 06/10/2019  . Influenza-Unspecified 07/03/2017  . Pneumococcal Conjugate-13 02/07/2016  . Pneumococcal Polysaccharide-23 09/03/2017  . Td 02/07/2016    Preventative care: Cologuard 04/2018 Last mammogram: 04/2016 DUE was rescheduled due to [place]- getting today Last pap smear/pelvic exam: s/p TAH   DEXA: Ordered with MGM CXr 2012  Prior vaccinations: TD or Tdap: 2017 Influenza: 2015  Pneumococcal: 2019 Prevnar13: 2017 Shingles/Zostavax: 2012  Names of Other Physician/Practitioners you currently use: 1. [facility]- here for primary care 2. Dr. [name], eye doctor, last visit 07/2019 Dr. Zenaida Niece, dentist, last visit 2017 Patient Care Team: Unk Pinto, MD as PCP - General (Internal Medicine) [name] Clent Demark, MD as Consulting Physician (Ophthalmology) Kathrynn Ducking, MD as Consulting Physician (Neurology) Monna Fam, MD as Consulting Physician (Ophthalmology) Earnstine Regal, PA-C as Physician Assistant (Obstetrics and Gynecology)  Surgical: She  has a past surgical history that includes Tonsillectomy; Abdominal hysterectomy; Hip surgery; Ankle fracture surgery; and Cataract extraction, bilateral. Family Her family history includes Diabetes in her brother and mother. Social history  She reports that she has never smoked. She has never used smokeless tobacco. She reports that she does not drink alcohol or use drugs.  MEDICARE WELLNESS OBJECTIVES: Physical activity: Current Exercise Habits: The patient does not participate in regular exercise at present(keeps 54 year old grand son twice a  week), Exercise limited by: orthopedic condition(s) Cardiac risk factors: Cardiac Risk Factors include: advanced age (>5mn, >>46women);diabetes mellitus;dyslipidemia;hypertension;sedentary lifestyle;obesity (BMI >30kg/m2) Depression/mood screen:   [facility]  Decreased Interest 0  Down, Depressed, Hopeless 0  PHQ - 2 Score 0  Altered sleeping -  Tired, decreased energy -  Change in appetite -  Feeling bad or failure about yourself  -  Trouble concentrating -  Moving slowly or fidgety/restless -  Suicidal thoughts -  PHQ-9 Score -  Difficult doing work/chores -    ADLs:  In your present state of health, do you have any difficulty performing the following activities: 08/09/2019 05/03/2019  Hearing? N N  Vision? N N  Difficulty concentrating or making decisions? Y -  Comment has some short term memory loss -  Walking or climbing stairs? N Y  Dressing or bathing? N N  Doing errands, shopping? Y N  Comment due to Mac degen, not driving -  Some recent data might be hidden     Cognitive Testing  Alert? Yes  Normal Appearance?Yes  Oriented to person? Yes  Place? Yes   Time? Yes  Recall of three objects?  Yes  Can perform simple calculations? Yes  Displays appropriate judgment?Yes  Can read the correct time from a watch face?Yes  EOL planning: Does Patient Have a Medical Advance Directive?: No Type of Advance Directive: Healthcare Power of Attorney, Living will Does patient want to make changes to medical advance directive?: Yes (MAU/Ambulatory/Procedural Areas - Information given)   Objective:   Today's Vitals   08/09/19 1138  BP: 120/80  Pulse: 74  Temp: 97.7 F (36.5 C)  SpO2: 98%  Weight: 171 lb 6.4 oz (77.7 kg)  Height: _0  (1.651 m)   Body mass index is 28.52 kg/m.  General appearance: alert, no distress, WD/WN,  female HEENT: normocephalic, sclerae anicteric, TMs pearly, nares patent, no discharge or erythema, pharynx normal Oral  cavity: MMM, no lesions Neck: supple, no lymphadenopathy, no thyromegaly, no masses Heart: RRR, normal S1, S2, no murmurs Lungs: CTA bilaterally, no wheezes, rhonchi, or rales Abdomen: +bs, soft, non tender, non distended, no masses, no hepatomegaly, no splenomegaly Musculoskeletal: nontender, no swelling, no obvious deformity Extremities: mild edema, no cyanosis, no clubbing, dependent rubor Pulses: 2+ symmetric upper pulses, bilateral TP 1+ and DP absent, decrease cap refill, left toe with blood blister.  Neurological: alert, oriented x 3, CN2-12 intact, strength normal upper extremities and lower extremities, sensation decreased to knee bilaterally, gait antalgic, absent ankle reflex bilaterally.  Psychiatric: normal affect, behavior normal, pleasant  Breast: defer Gyn: defer Rectal: defer   Medicare Attestation I have personally reviewed: The patient's medical and social history Their use of alcohol, tobacco or illicit drugs Their current medications and supplements The patient's functional ability including ADLs,fall risks, home safety risks, cognitive, and hearing and visual impairment Diet and physical activities Evidence for depression or mood disorders  The patient's weight, height, BMI, and visual acuity have been recorded in the chart.  I have made referrals, counseling, and provided education to the patient based on review of the above and I have provided the patient with a written personalized care plan for preventive services.     Vicie Mutters, PA-C   08/09/2019

## 2019-08-09 ENCOUNTER — Other Ambulatory Visit: Payer: Self-pay

## 2019-08-09 ENCOUNTER — Encounter: Payer: Self-pay | Admitting: Physician Assistant

## 2019-08-09 ENCOUNTER — Ambulatory Visit (INDEPENDENT_AMBULATORY_CARE_PROVIDER_SITE_OTHER): Payer: Medicare Other | Admitting: Physician Assistant

## 2019-08-09 ENCOUNTER — Ambulatory Visit
Admission: RE | Admit: 2019-08-09 | Discharge: 2019-08-09 | Disposition: A | Discharge status: Home / Self Care | Payer: Medicare Other | Source: Ambulatory Visit | Attending: Internal Medicine | Admitting: Internal Medicine

## 2019-08-09 VITALS — BP 120/80 | HR 74 | Temp 97.7°F | Ht 65.0 in | Wt 171.4 lb

## 2019-08-09 DIAGNOSIS — E1169 Type 2 diabetes mellitus with other specified complication: Secondary | ICD-10-CM | POA: Diagnosis not present

## 2019-08-09 DIAGNOSIS — E1122 Type 2 diabetes mellitus with diabetic chronic kidney disease: Secondary | ICD-10-CM | POA: Diagnosis not present

## 2019-08-09 DIAGNOSIS — E11311 Type 2 diabetes mellitus with unspecified diabetic retinopathy with macular edema: Secondary | ICD-10-CM | POA: Diagnosis not present

## 2019-08-09 DIAGNOSIS — E348 Other specified endocrine disorders: Secondary | ICD-10-CM

## 2019-08-09 DIAGNOSIS — Z0001 Encounter for general adult medical examination with abnormal findings: Secondary | ICD-10-CM

## 2019-08-09 DIAGNOSIS — Z1231 Encounter for screening mammogram for malignant neoplasm of breast: Secondary | ICD-10-CM

## 2019-08-09 DIAGNOSIS — Z0000 Encounter for general adult medical examination without abnormal findings: Secondary | ICD-10-CM

## 2019-08-09 DIAGNOSIS — R6889 Other general symptoms and signs: Secondary | ICD-10-CM

## 2019-08-09 DIAGNOSIS — Z79899 Other long term (current) drug therapy: Secondary | ICD-10-CM

## 2019-08-09 DIAGNOSIS — N182 Chronic kidney disease, stage 2 (mild): Secondary | ICD-10-CM

## 2019-08-09 DIAGNOSIS — E782 Mixed hyperlipidemia: Secondary | ICD-10-CM

## 2019-08-09 DIAGNOSIS — E1151 Type 2 diabetes mellitus with diabetic peripheral angiopathy without gangrene: Secondary | ICD-10-CM | POA: Diagnosis not present

## 2019-08-09 DIAGNOSIS — K760 Fatty (change of) liver, not elsewhere classified: Secondary | ICD-10-CM

## 2019-08-09 DIAGNOSIS — E559 Vitamin D deficiency, unspecified: Secondary | ICD-10-CM

## 2019-08-09 DIAGNOSIS — M21371 Foot drop, right foot: Secondary | ICD-10-CM

## 2019-08-09 DIAGNOSIS — E0842 Diabetes mellitus due to underlying condition with diabetic polyneuropathy: Secondary | ICD-10-CM

## 2019-08-09 DIAGNOSIS — H3530 Unspecified macular degeneration: Secondary | ICD-10-CM

## 2019-08-09 DIAGNOSIS — M21372 Foot drop, left foot: Secondary | ICD-10-CM

## 2019-08-09 DIAGNOSIS — I10 Essential (primary) hypertension: Secondary | ICD-10-CM

## 2019-08-09 DIAGNOSIS — E785 Hyperlipidemia, unspecified: Secondary | ICD-10-CM

## 2019-08-09 MED ORDER — GLUCOSE BLOOD VI STRP: ORAL_STRIP | 0 refills | Status: DC

## 2019-08-09 NOTE — Patient Instructions (Addendum)
Your LDL is not in range or at goal, goal is less than 70.  Your LDL is the bad cholesterol that can lead to heart attack and stroke. To lower your number you can decrease your fatty foods, red meat, cheese, milk and increase fiber like whole grains and veggies. You can also add a fiber supplement like Citracel or Benefiber, these do not cause gas and bloating and are safe to use. Since you have risk factors that make [place] want your number below 70, we need to adjust or add medications to get your number below goal.   Statin therapy  In addition to the known lipid-lowering effects, statins are now widely accepted to have anti-inflammatory and immunomodulatory effects. Adjunctive use of statins has proven beneficial in the context of a wide range of inflammatory diseases, including rheumatoid arthritis.  Research has shown that the salutary effect of statins on cholesterol may not be their only benefit. Statin therapy has shown promise for everything from fighting viral infections to protecting the eye from cataracts.  A 2005 study of more than 700 hospital patients being treated for pneumonia found that the death rate was more than twice as high among those who were not using statins. In 2006, a [place] study examined the rate of sepsis, a deadly blood infection, among patients who had been hospitalized for heart events. In the two years after their hospitalization, the statin users had a rate of sepsis 19% lower than that of the non-statin users. A 2009 review of 22 studies found that statins appeared to have a beneficial effect on the outcome of infection, but they couldn't come to a firm conclusion.  Ask insurance and pharmacy about shingrix - new vaccine   Can go to [employer] for more information  Shingrix Vaccination  Two vaccines are licensed and recommended to prevent shingles in the [place] Zoster vaccine live (ZVL, Zostavax) has been  in use since 2006. Recombinant zoster vaccine (RZV, Shingrix), has been in use since 2017 and is recommended by ACIP as the preferred shingles vaccine.  What Everyone Should Know about Shingles Vaccine (Shingrix) One of the Recommended Vaccines by Disease Shingles vaccination is the only way to protect against shingles and postherpetic neuralgia (PHN), the most common complication from shingles. CDC recommends that healthy adults 50 years and older get two doses of the shingles vaccine called Shingrix (recombinant zoster vaccine), separated by 2 to 6 months, to prevent shingles and the complications from the disease. Your doctor or pharmacist can give you Shingrix as a shot in your upper arm. Shingrix provides strong protection against shingles and PHN. Two doses of Shingrix is more than 90% effective at preventing shingles and PHN. Protection stays above 85% for at least the first four years after you get vaccinated. Shingrix is the preferred vaccine, over Zostavax (zoster vaccine live), a shingles vaccine in use since 2006. Zostavax may still be used to prevent shingles in healthy adults 60 years and older. For example, you could use Zostavax if a person is allergic to Shingrix, prefers Zostavax, or requests immediate vaccination and Shingrix is unavailable. Who Should Get Shingrix? Healthy adults 50 years and older should get two doses of Shingrix, separated by 2 to 6 months. You should get Shingrix even if in the past you . had shingles  . received Zostavax  . are not sure if you had chickenpox There is no maximum age for getting Shingrix. If you had shingles in the past, you can get Shingrix to help  prevent future occurrences of the disease. There is no specific length of time that you need to wait after having shingles before you can receive Shingrix, but generally you should make sure the shingles rash has gone away before getting vaccinated. You can get Shingrix whether or not you remember  having had chickenpox in the past. Studies show that more than 99% of Americans 40 years and older have had chickenpox, even if they don't remember having the disease. Chickenpox and shingles are related because they are caused by the same virus (varicella zoster virus). After a person recovers from chickenpox, the virus stays dormant (inactive) in the body. It can reactivate years later and cause shingles. If you had Zostavax in the recent past, you should wait at least eight weeks before getting Shingrix. Talk to your healthcare provider to determine the best time to get Shingrix. Shingrix is available in [facility] and pharmacies. To find doctor's offices or pharmacies near you that offer the vaccine, visit [employer] Vaccine FinderExternal. If you have questions about Shingrix, talk with your healthcare provider. Vaccine for Those 28 Years and Older  Shingrix reduces the risk of shingles and PHN by more than 90% in people 23 and older. CDC recommends the vaccine for healthy adults 73 and older.  Who Should Not Get Shingrix? You should not get Shingrix if you: . have ever had a severe allergic reaction to any component of the vaccine or after a dose of Shingrix  . tested negative for immunity to varicella zoster virus. If you test negative, you should get chickenpox vaccine.  . currently have shingles  . currently are pregnant or breastfeeding. Women who are pregnant or breastfeeding should wait to get Shingrix.  Marland Kitchen receive specific antiviral drugs (acyclovir, famciclovir, or valacyclovir) 24 hours before vaccination (avoid use of these antiviral drugs for 14 days after vaccination)- zoster vaccine live only If you have a minor acute (starts suddenly) illness, such as a cold, you may get Shingrix. But if you have a moderate or severe acute illness, you should usually wait until you recover before getting the vaccine. This includes anyone with a temperature of 101.98F or higher. The side effects  of the Shingrix are temporary, and usually last 2 to 3 days. While you may experience pain for a few days after getting Shingrix, the pain will be less severe than having shingles and the complications from the disease. How Well Does Shingrix Work? Two doses of Shingrix provides strong protection against shingles and postherpetic neuralgia (PHN), the most common complication of shingles. . In adults 76 to 68 years old who got two doses, Shingrix was 97% effective in preventing shingles; among adults 70 years and older, Shingrix was 91% effective.  . In adults 58 to 69 years old who got two doses, Shingrix was 91% effective in preventing PHN; among adults 70 years and older, Shingrix was 89% effective. Shingrix protection remained high (more than 85%) in people 70 years and older throughout the four years following vaccination. Since your risk of shingles and PHN increases as you get older, it is important to have strong protection against shingles in your older years. Top of Page  What Are the Possible Side Effects of Shingrix? Studies show that Shingrix is safe. The vaccine helps your body create a strong defense against shingles. As a result, you are likely to have temporary side effects from getting the shots. The side effects may affect your ability to do normal daily activities for 2 to  3 days. Most people got a sore arm with mild or moderate pain after getting Shingrix, and some also had redness and swelling where they got the shot. Some people felt tired, had muscle pain, a headache, shivering, fever, stomach pain, or nausea. About 1 out of 6 people who got Shingrix experienced side effects that prevented them from doing regular activities. Symptoms went away on their own in about 2 to 3 days. Side effects were more common in younger people. You might have a reaction to the first or second dose of Shingrix, or both doses. If you experience side effects, you may choose to take over-the-counter pain  medicine such as ibuprofen or acetaminophen. If you experience side effects from Shingrix, you should report them to the Vaccine Adverse Event Reporting System (VAERS). Your doctor might file this report, or you can do it yourself through the VAERS websiteExternal, or by calling [phone]. If you have any questions about side effects from Shingrix, talk with your doctor. The shingles vaccine does not contain thimerosal (a preservative containing mercury). Top of Page  When Should I See a Doctor Because of the Side Effects I Experience From Shingrix? In clinical trials, Shingrix was not associated with serious adverse events. In fact, serious side effects from vaccines are extremely rare. For example, for every 1 million doses of a vaccine given, only one or two people may have a severe allergic reaction. Signs of an allergic reaction happen within minutes or hours after vaccination and include hives, swelling of the face and throat, difficulty breathing, a fast heartbeat, dizziness, or weakness. If you experience these or any other life-threatening symptoms, see a doctor right away. Shingrix causes a strong response in your immune system, so it may produce short-term side effects more intense than you are used to from other vaccines. These side effects can be uncomfortable, but they are expected and usually go away on their own in 2 or 3 days. Top of Page  How Can I Pay For Shingrix? There are several ways shingles vaccine may be paid for: Medicare . Medicare Part D plans cover the shingles vaccine, but there may be a cost to you depending on your plan. There may be a copay for the vaccine, or you may need to pay in full then get reimbursed for a certain amount.  . Medicare Part B does not cover the shingles vaccine. Medicaid . Medicaid may or may not cover the vaccine. Contact your insurer to find out. Private health insurance . Many private health insurance plans will cover the vaccine, but  there may be a cost to you depending on your plan. Contact your insurer to find out. Vaccine assistance programs . Some pharmaceutical companies provide vaccines to eligible adults who cannot afford them. You may want to check with the vaccine manufacturer, [employer], about Shingrix. If you do not currently have health insurance, learn more about affordable health coverage optionsExternal. To find doctor's offices or pharmacies near you that offer the vaccine, visit HealthMap Vaccine FinderExternal.

## 2019-08-10 LAB — COMPLETE METABOLIC PANEL WITH GFR
AG Ratio: 1.7 (calc) (ref 1.0–2.5)
ALT: 11 U/L (ref 6–29)
AST: 10 U/L (ref 10–35)
Albumin: 3.9 g/dL (ref 3.6–5.1)
Alkaline phosphatase (APISO): 75 U/L (ref 37–153)
BUN/Creatinine Ratio: 28 (calc) — ABNORMAL HIGH (ref 6–22)
BUN: 29 mg/dL — ABNORMAL HIGH (ref 7–25)
CO2: 30 mmol/L (ref 20–32)
Calcium: 9.5 mg/dL (ref 8.6–10.4)
Chloride: 100 mmol/L (ref 98–110)
Creat: 1.04 mg/dL — ABNORMAL HIGH (ref 0.50–0.99)
GFR, Est African American: 63 mL/min/{1.73_m2} (ref >=60)
GFR, Est Non African American: 55 mL/min/{1.73_m2} — ABNORMAL LOW (ref >=60)
Globulin: 2.3 g/dL (calc) (ref 1.9–3.7)
Glucose, Bld: 179 mg/dL — ABNORMAL HIGH (ref 65–99)
Potassium: 5.2 mmol/L (ref 3.5–5.3)
Sodium: 139 mmol/L (ref 135–146)
Total Bilirubin: 0.2 mg/dL (ref 0.2–1.2)
Total Protein: 6.2 g/dL (ref 6.1–8.1)

## 2019-08-10 LAB — CBC WITH DIFFERENTIAL/PLATELET
Absolute Monocytes: 570 cells/uL (ref 200–950)
Basophils Absolute: 30 cells/uL (ref 0–200)
Basophils Relative: 0.4 %
Eosinophils Absolute: 192 cells/uL (ref 15–500)
Eosinophils Relative: 2.6 %
HCT: 35.9 % (ref 35.0–45.0)
Hemoglobin: 11.9 g/dL (ref 11.7–15.5)
Lymphs Abs: 1850 cells/uL (ref 850–3900)
MCH: 30.4 pg (ref 27.0–33.0)
MCHC: 33.1 g/dL (ref 32.0–36.0)
MCV: 91.8 fL (ref 80.0–100.0)
MPV: 9.1 fL (ref 7.5–12.5)
Monocytes Relative: 7.7 %
Neutro Abs: 4758 cells/uL (ref 1500–7800)
Neutrophils Relative %: 64.3 %
Platelets: 426 10*3/uL — ABNORMAL HIGH (ref 140–400)
RBC: 3.91 10*6/uL (ref 3.80–5.10)
RDW: 12.8 % (ref 11.0–15.0)
Total Lymphocyte: 25 %
WBC: 7.4 10*3/uL (ref 3.8–10.8)

## 2019-08-10 LAB — HEMOGLOBIN A1C
Hgb A1c MFr Bld: 7.5 % of total Hgb — ABNORMAL HIGH (ref <5.7)
Mean Plasma Glucose: 169 (calc)
eAG (mmol/L): 9.3 (calc)

## 2019-08-10 LAB — LIPID PANEL
Cholesterol: 208 mg/dL — ABNORMAL HIGH (ref <200)
HDL: 54 mg/dL (ref >=50)
LDL Cholesterol (Calc): 110 mg/dL (calc) — ABNORMAL HIGH
Non-HDL Cholesterol (Calc): 154 mg/dL (calc) — ABNORMAL HIGH (ref <130)
Total CHOL/HDL Ratio: 3.9 (calc) (ref <5.0)
Triglycerides: 332 mg/dL — ABNORMAL HIGH (ref <150)

## 2019-08-10 LAB — MAGNESIUM: Magnesium: 1.9 mg/dL (ref 1.5–2.5)

## 2019-08-10 LAB — VITAMIN D 25 HYDROXY (VIT D DEFICIENCY, FRACTURES): Vit D, 25-Hydroxy: 52 ng/mL (ref 30–100)

## 2019-08-10 LAB — TSH: TSH: 1.46 mIU/L (ref 0.40–4.50)

## 2019-08-15 ENCOUNTER — Other Ambulatory Visit: Payer: Self-pay | Admitting: Internal Medicine

## 2019-08-15 DIAGNOSIS — R928 Other abnormal and inconclusive findings on diagnostic imaging of breast: Secondary | ICD-10-CM

## 2019-08-16 ENCOUNTER — Ambulatory Visit
Admission: RE | Admit: 2019-08-16 | Discharge: 2019-08-16 | Disposition: A | Discharge status: Home / Self Care | Payer: Medicare Other | Source: Ambulatory Visit | Attending: Physician Assistant | Admitting: Physician Assistant

## 2019-08-16 ENCOUNTER — Other Ambulatory Visit: Payer: Self-pay

## 2019-08-16 DIAGNOSIS — E348 Other specified endocrine disorders: Secondary | ICD-10-CM

## 2019-08-23 ENCOUNTER — Ambulatory Visit
Admission: RE | Admit: 2019-08-23 | Discharge: 2019-08-23 | Disposition: A | Discharge status: Home / Self Care | Payer: Medicare Other | Source: Ambulatory Visit | Attending: Internal Medicine | Admitting: Internal Medicine

## 2019-08-23 ENCOUNTER — Other Ambulatory Visit: Payer: Self-pay

## 2019-08-23 ENCOUNTER — Other Ambulatory Visit: Payer: Self-pay | Admitting: Internal Medicine

## 2019-08-23 DIAGNOSIS — R928 Other abnormal and inconclusive findings on diagnostic imaging of breast: Secondary | ICD-10-CM

## 2019-08-23 DIAGNOSIS — N6489 Other specified disorders of breast: Secondary | ICD-10-CM

## 2019-08-23 DIAGNOSIS — N6001 Solitary cyst of right breast: Secondary | ICD-10-CM

## 2019-09-08 DIAGNOSIS — E113511 Type 2 diabetes mellitus with proliferative diabetic retinopathy with macular edema, right eye: Secondary | ICD-10-CM | POA: Diagnosis not present

## 2019-09-08 DIAGNOSIS — E113512 Type 2 diabetes mellitus with proliferative diabetic retinopathy with macular edema, left eye: Secondary | ICD-10-CM | POA: Diagnosis not present

## 2019-09-08 DIAGNOSIS — H35372 Puckering of macula, left eye: Secondary | ICD-10-CM | POA: Diagnosis not present

## 2019-09-08 DIAGNOSIS — H35371 Puckering of macula, right eye: Secondary | ICD-10-CM | POA: Diagnosis not present

## 2019-10-02 ENCOUNTER — Ambulatory Visit: Payer: Medicare Other

## 2019-10-17 DIAGNOSIS — E113512 Type 2 diabetes mellitus with proliferative diabetic retinopathy with macular edema, left eye: Secondary | ICD-10-CM | POA: Diagnosis not present

## 2019-10-17 DIAGNOSIS — G4733 Obstructive sleep apnea (adult) (pediatric): Secondary | ICD-10-CM | POA: Diagnosis not present

## 2019-10-17 DIAGNOSIS — H35372 Puckering of macula, left eye: Secondary | ICD-10-CM | POA: Diagnosis not present

## 2019-10-17 DIAGNOSIS — R0683 Snoring: Secondary | ICD-10-CM | POA: Diagnosis not present

## 2019-10-17 LAB — HM DIABETES EYE EXAM

## 2019-10-24 ENCOUNTER — Encounter: Payer: Self-pay | Admitting: *Deleted

## 2019-10-24 DIAGNOSIS — E113512 Type 2 diabetes mellitus with proliferative diabetic retinopathy with macular edema, left eye: Secondary | ICD-10-CM | POA: Diagnosis not present

## 2019-10-24 DIAGNOSIS — H35371 Puckering of macula, right eye: Secondary | ICD-10-CM | POA: Diagnosis not present

## 2019-10-24 DIAGNOSIS — E113511 Type 2 diabetes mellitus with proliferative diabetic retinopathy with macular edema, right eye: Secondary | ICD-10-CM | POA: Diagnosis not present

## 2019-11-08 ENCOUNTER — Other Ambulatory Visit: Payer: Self-pay | Admitting: *Deleted

## 2019-11-08 DIAGNOSIS — E0842 Diabetes mellitus due to underlying condition with diabetic polyneuropathy: Secondary | ICD-10-CM

## 2019-11-08 DIAGNOSIS — E785 Hyperlipidemia, unspecified: Secondary | ICD-10-CM

## 2019-11-08 DIAGNOSIS — E11311 Type 2 diabetes mellitus with unspecified diabetic retinopathy with macular edema: Secondary | ICD-10-CM

## 2019-11-08 DIAGNOSIS — N182 Chronic kidney disease, stage 2 (mild): Secondary | ICD-10-CM

## 2019-11-08 DIAGNOSIS — E1122 Type 2 diabetes mellitus with diabetic chronic kidney disease: Secondary | ICD-10-CM

## 2019-11-08 DIAGNOSIS — E1169 Type 2 diabetes mellitus with other specified complication: Secondary | ICD-10-CM

## 2019-11-08 DIAGNOSIS — E1151 Type 2 diabetes mellitus with diabetic peripheral angiopathy without gangrene: Secondary | ICD-10-CM

## 2019-11-08 MED ORDER — GLUCOSE BLOOD VI STRP: ORAL_STRIP | 1 refills | Status: DC

## 2019-11-10 ENCOUNTER — Other Ambulatory Visit: Payer: Self-pay

## 2019-11-10 ENCOUNTER — Ambulatory Visit (INDEPENDENT_AMBULATORY_CARE_PROVIDER_SITE_OTHER): Payer: Medicare Other | Admitting: Internal Medicine

## 2019-11-10 ENCOUNTER — Encounter: Payer: Self-pay | Admitting: Internal Medicine

## 2019-11-10 ENCOUNTER — Other Ambulatory Visit: Payer: Self-pay | Admitting: *Deleted

## 2019-11-10 VITALS — BP 130/80 | HR 64 | Temp 97.0°F | Resp 16 | Ht 64.5 in | Wt 175.0 lb

## 2019-11-10 DIAGNOSIS — Z79899 Other long term (current) drug therapy: Secondary | ICD-10-CM | POA: Diagnosis not present

## 2019-11-10 DIAGNOSIS — E1121 Type 2 diabetes mellitus with diabetic nephropathy: Secondary | ICD-10-CM

## 2019-11-10 DIAGNOSIS — E785 Hyperlipidemia, unspecified: Secondary | ICD-10-CM

## 2019-11-10 DIAGNOSIS — E0842 Diabetes mellitus due to underlying condition with diabetic polyneuropathy: Secondary | ICD-10-CM

## 2019-11-10 DIAGNOSIS — E559 Vitamin D deficiency, unspecified: Secondary | ICD-10-CM | POA: Diagnosis not present

## 2019-11-10 DIAGNOSIS — I10 Essential (primary) hypertension: Secondary | ICD-10-CM | POA: Diagnosis not present

## 2019-11-10 DIAGNOSIS — E1169 Type 2 diabetes mellitus with other specified complication: Secondary | ICD-10-CM

## 2019-11-10 DIAGNOSIS — E1122 Type 2 diabetes mellitus with diabetic chronic kidney disease: Secondary | ICD-10-CM

## 2019-11-10 DIAGNOSIS — N1831 Chronic kidney disease, stage 3a: Secondary | ICD-10-CM | POA: Diagnosis not present

## 2019-11-10 DIAGNOSIS — G479 Sleep disorder, unspecified: Secondary | ICD-10-CM

## 2019-11-10 MED ORDER — ACCU-CHEK GUIDE VI STRP: ORAL_STRIP | 12 refills | Status: DC

## 2019-11-10 NOTE — Patient Instructions (Signed)

## 2019-11-10 NOTE — Progress Notes (Signed)
History of Present Illness:       This very nice 69 y.o., MWF  presents for 6 month follow up with HTN, HLD, T2_DM w/CKD3a (GFR 55) Diab Retinopathy, Wet MD &  PSN and Vitamin D Deficiency.       Patient relates that Dr [name] requests that she be scheduled for a sleep study. She admits to snoring, but is unaware if she's had apeic spells.           Patient is treated for HTN (1960's) & BP has been controlled at home. Today's BP is at goal - 130/80. Patient has had no complaints of any cardiac type chest pain, palpitations, dyspnea / orthopnea / PND, dizziness, claudication, or dependent edema.      Hyperlipidemia is not controlled with diet & meds. Patient denies myalgias or other med SE's. Last Lipids were not at goal:  Lab Results  Component Value Date   CHOL 208 (H) 08/09/2019   HDL 54 08/09/2019   LDLCALC 110 (H) 08/09/2019   TRIG 332 (H) 08/09/2019   CHOLHDL 3.9 08/09/2019    Also, the patient's DM predates to Gestational DM in 1980 and a honeymoon period til 2006 when started on Metformin for T2_NIDDM. Patient is followed by Dr Jannifer Franklin for painful DN and bilat foot drop and she is followed closely by Dr [name] for Wet MD & DR. She has had no symptoms of reactive hypoglycemia, diabetic polys, but has poor vision consequent of her diabetic eye Dz.   Last A1c was not at goal:   Lab Results  Component Value Date   HGBA1C 7.5 (H) 08/09/2019       Further, the patient also has history of Vitamin D Deficiency ("26" / 2016)  and supplements vitamin D without any suspected side-effects. Last vitamin D was not at goal:  Lab Results  Component Value Date   VD25OH 52 08/09/2019    Current Outpatient Medications on File Prior to Visit  Medication Sig  . Ascorbic Acid (VITAMIN C PO) Take 1 tablet by mouth daily.  . Calcium Citrate-Vitamin D (CITRACAL/VITAMIN D PO) Take 1 tablet by mouth daily.  . Cholecalciferol (VITAMIN D PO) Take 1,000 Units by mouth 2 (two) times daily.  Reported on 02/07/2016  . DULoxetine (CYMBALTA) 30 MG capsule TAKE 1 CAPSULE BY MOUTH EVERY DAY  . glimepiride (AMARYL) 2 MG tablet Take 1 tablet 1 or 2 x /day with Meals for Diabetes  . Magnesium 250 MG TABS Take 2 tablets by mouth daily.   . metFORMIN (GLUCOPHAGE-XR) 500 MG 24 hr tablet Take 2 tablets 2 x /day with meals for Diabetes  . zinc gluconate 50 MG tablet Take 50 mg by mouth daily.   No current facility-administered medications on file prior to visit.    Allergies  Allergen Reactions  . Gabapentin Swelling    Swelling in legs and feet    PMHx:   Past Medical History:  Diagnosis Date  . Abnormality of gait 07/28/2013  . Diabetes ([place])   . Diabetic peripheral neuropathy ([place])   . Diabetic retinopathy ([place])   . Foot drop, bilateral 07/31/2014  . Peripheral edema   . Polyneuropathy in diabetes(357.2) 07/28/2013    Immunization History  Administered Date(s) Administered  . Influenza, High Dose Seasonal PF 05/12/2016, 07/03/2017, 05/24/2018, 06/10/2019  . Influenza-Unspecified 07/03/2017  . Pneumococcal Conjugate-13 02/07/2016  . Pneumococcal Polysaccharide-23 09/03/2017  . Td 02/07/2016    Past Surgical History:  Procedure Laterality Date  .  ABDOMINAL HYSTERECTOMY    . ANKLE FRACTURE SURGERY     left  . CATARACT EXTRACTION, BILATERAL    . HIP SURGERY     right  . TONSILLECTOMY      FHx:    Reviewed / unchanged  SHx:    Reviewed / unchanged   Systems Review:  Constitutional: Denies fever, chills, wt changes, headaches, insomnia, fatigue, night sweats, change in appetite. Eyes: Denies redness, blurred vision, diplopia, discharge, itchy, watery eyes.  ENT: Denies discharge, congestion, post nasal drip, epistaxis, sore throat, earache, hearing loss, dental pain, tinnitus, vertigo, sinus pain, snoring.  CV: Denies chest pain, palpitations, irregular heartbeat, syncope, dyspnea, diaphoresis, orthopnea, PND, claudication or edema. Respiratory: denies cough,  dyspnea, DOE, pleurisy, hoarseness, laryngitis, wheezing.  Gastrointestinal: Denies dysphagia, odynophagia, heartburn, reflux, water brash, abdominal pain or cramps, nausea, vomiting, bloating, diarrhea, constipation, hematemesis, melena, hematochezia  or hemorrhoids. Genitourinary: Denies dysuria, frequency, urgency, nocturia, hesitancy, discharge, hematuria or flank pain. Musculoskeletal: Denies arthralgias, myalgias, stiffness, jt. swelling, pain, limping or strain/sprain.  Skin: Denies pruritus, rash, hives, warts, acne, eczema or change in skin lesion(s). Neuro: No weakness, tremor, incoordination, spasms, paresthesia or pain. Psychiatric: Denies confusion, memory loss or sensory loss. Endo: Denies change in weight, skin or hair change.  Heme/Lymph: No excessive bleeding, bruising or enlarged lymph nodes.  Physical Exam  BP 130/80   Pulse 64   Temp (!) 97 F (36.1 C)   Resp 16   Ht 5' 4.5" (1.638 m)   Wt 175 lb (79.4 kg)   BMI 29.57 kg/m   Appears  well nourished, well groomed  and in no distress.  Eyes: PERRLA, EOMs, conjunctiva no swelling or erythema. Sinuses: No frontal/maxillary tenderness ENT/Mouth: EAC's clear, TM's nl w/o erythema, bulging. Nares clear w/o erythema, swelling, exudates. Oropharynx clear without erythema or exudates. Oral hygiene is good. Tongue normal, non obstructing. Hearing intact.  Neck: Supple. Thyroid not palpable. Car 2+/2+ without bruits, nodes or JVD. Chest: Respirations nl with BS clear & equal w/o rales, rhonchi, wheezing or stridor.  Cor: Heart sounds normal w/ regular rate and rhythm without sig. murmurs, gallops, clicks or rubs. Peripheral pulses normal and equal  without edema.  Abdomen: Soft & bowel sounds normal. Non-tender w/o guarding, rebound, hernias, masses or organomegaly.  Lymphatics: Unremarkable.  Musculoskeletal: Full ROM all peripheral extremities, joint stability, 5/5 strength and normal gait. Has bilat ankle braces. Skin:  Warm, dry without exposed rashes, lesions or ecchymosis apparent.  Neuro: Cranial nerves intact, reflexes equal bilaterally. Sensation decreased to touch in a stocking distribution.Tendon reflexes flat.  Pysch: Alert & oriented x 3.  Insight and judgement nl & appropriate. No ideations.  Assessment and Plan:  1. Essential hypertension  - Continue medication, monitor blood pressure at home.  - Continue DASH diet.  Reminder to go to the ER if any CP,  SOB, nausea, dizziness, severe HA, changes vision/speech.  - CBC with Differential/Platelet - COMPLETE METABOLIC PANEL WITH GFR - Magnesium - TSH  2. Hyperlipidemia associated with type 2 diabetes mellitus ([place])  - Continue diet/meds, exercise,& lifestyle modifications.  - Continue monitor periodic cholesterol/liver & renal functions   - Lipid panel - TSH  3. Type 2 diabetes mellitus with stage 3a chronic kidney disease,  without long-term current use of insulin ([facility])  - Continue diet, exercise  - Lifestyle modifications.  - Monitor appropriate labs.  - Hemoglobin A1c - Insulin, random  4. Vitamin D deficiency  - Continue supplementation.  - VITAMIN D 25 Hydroxy  5. Diabetic polyneuropathy associated with diabetes  mellitus due to underlying condition ([place])  - Hemoglobin A1c  6. Medication management  - CBC with Differential/Platelet - COMPLETE METABOLIC PANEL WITH GFR - Magnesium - Lipid panel - TSH - Hemoglobin A1c - Insulin, random - VITAMIN D 25 Hydroxy         Discussed  regular exercise, BP monitoring, weight control to achieve/maintain BMI less than 25 and discussed med and SE's. Recommended labs to assess and monitor clinical status with further disposition pending results of labs.  I discussed the assessment and treatment plan with the patient. The patient was provided an opportunity to ask questions and all were answered. The patient agreed with the plan and demonstrated an understanding of the  instructions.  I provided over 30 minutes of exam, counseling, chart review and  complex critical decision making.  Kirtland Bouchard, MD

## 2019-11-11 ENCOUNTER — Encounter: Payer: Self-pay | Admitting: *Deleted

## 2019-11-11 ENCOUNTER — Other Ambulatory Visit: Payer: Self-pay | Admitting: Internal Medicine

## 2019-11-11 LAB — COMPLETE METABOLIC PANEL WITH GFR
AG Ratio: 1.8 (calc) (ref 1.0–2.5)
ALT: 18 U/L (ref 6–29)
AST: 17 U/L (ref 10–35)
Albumin: 4.1 g/dL (ref 3.6–5.1)
Alkaline phosphatase (APISO): 70 U/L (ref 37–153)
BUN/Creatinine Ratio: 24 (calc) — ABNORMAL HIGH (ref 6–22)
BUN: 27 mg/dL — ABNORMAL HIGH (ref 7–25)
CO2: 30 mmol/L (ref 20–32)
Calcium: 10.1 mg/dL (ref 8.6–10.4)
Chloride: 100 mmol/L (ref 98–110)
Creat: 1.11 mg/dL — ABNORMAL HIGH (ref 0.50–0.99)
GFR, Est African American: 59 mL/min/{1.73_m2} — ABNORMAL LOW (ref >=60)
GFR, Est Non African American: 51 mL/min/{1.73_m2} — ABNORMAL LOW (ref >=60)
Globulin: 2.3 g/dL (calc) (ref 1.9–3.7)
Glucose, Bld: 196 mg/dL — ABNORMAL HIGH (ref 65–99)
Potassium: 5.6 mmol/L — ABNORMAL HIGH (ref 3.5–5.3)
Sodium: 141 mmol/L (ref 135–146)
Total Bilirubin: 0.4 mg/dL (ref 0.2–1.2)
Total Protein: 6.4 g/dL (ref 6.1–8.1)

## 2019-11-11 LAB — CBC WITH DIFFERENTIAL/PLATELET
Absolute Monocytes: 646 cells/uL (ref 200–950)
Basophils Absolute: 38 cells/uL (ref 0–200)
Basophils Relative: 0.5 %
Eosinophils Absolute: 281 cells/uL (ref 15–500)
Eosinophils Relative: 3.7 %
HCT: 38.7 % (ref 35.0–45.0)
Hemoglobin: 12.6 g/dL (ref 11.7–15.5)
Lymphs Abs: 2348 cells/uL (ref 850–3900)
MCH: 29.5 pg (ref 27.0–33.0)
MCHC: 32.6 g/dL (ref 32.0–36.0)
MCV: 90.6 fL (ref 80.0–100.0)
MPV: 9.5 fL (ref 7.5–12.5)
Monocytes Relative: 8.5 %
Neutro Abs: 4286 cells/uL (ref 1500–7800)
Neutrophils Relative %: 56.4 %
Platelets: 432 10*3/uL — ABNORMAL HIGH (ref 140–400)
RBC: 4.27 10*6/uL (ref 3.80–5.10)
RDW: 13.5 % (ref 11.0–15.0)
Total Lymphocyte: 30.9 %
WBC: 7.6 10*3/uL (ref 3.8–10.8)

## 2019-11-11 LAB — TSH: TSH: 2.09 mIU/L (ref 0.40–4.50)

## 2019-11-11 LAB — LIPID PANEL
Cholesterol: 219 mg/dL — ABNORMAL HIGH (ref <200)
HDL: 57 mg/dL (ref >=50)
LDL Cholesterol (Calc): 128 mg/dL (calc) — ABNORMAL HIGH
Non-HDL Cholesterol (Calc): 162 mg/dL (calc) — ABNORMAL HIGH (ref <130)
Total CHOL/HDL Ratio: 3.8 (calc) (ref <5.0)
Triglycerides: 206 mg/dL — ABNORMAL HIGH (ref <150)

## 2019-11-11 LAB — HEMOGLOBIN A1C
Hgb A1c MFr Bld: 7.7 % of total Hgb — ABNORMAL HIGH (ref <5.7)
Mean Plasma Glucose: 174 (calc)
eAG (mmol/L): 9.7 (calc)

## 2019-11-11 LAB — VITAMIN D 25 HYDROXY (VIT D DEFICIENCY, FRACTURES): Vit D, 25-Hydroxy: 63 ng/mL (ref 30–100)

## 2019-11-11 LAB — INSULIN, RANDOM: Insulin: 14.6 u[IU]/mL

## 2019-11-11 LAB — MAGNESIUM: Magnesium: 1.8 mg/dL (ref 1.5–2.5)

## 2019-11-11 MED ORDER — ROSUVASTATIN CALCIUM 20 MG PO TABS: ORAL_TABLET | ORAL | 3 refills | Status: DC

## 2019-12-01 ENCOUNTER — Ambulatory Visit (INDEPENDENT_AMBULATORY_CARE_PROVIDER_SITE_OTHER): Payer: Medicare Other | Admitting: Ophthalmology

## 2019-12-01 ENCOUNTER — Other Ambulatory Visit: Payer: Self-pay

## 2019-12-01 ENCOUNTER — Encounter (INDEPENDENT_AMBULATORY_CARE_PROVIDER_SITE_OTHER): Payer: Self-pay | Admitting: Ophthalmology

## 2019-12-01 DIAGNOSIS — E113511 Type 2 diabetes mellitus with proliferative diabetic retinopathy with macular edema, right eye: Secondary | ICD-10-CM | POA: Diagnosis not present

## 2019-12-01 DIAGNOSIS — E113512 Type 2 diabetes mellitus with proliferative diabetic retinopathy with macular edema, left eye: Secondary | ICD-10-CM | POA: Diagnosis not present

## 2019-12-01 DIAGNOSIS — H35372 Puckering of macula, left eye: Secondary | ICD-10-CM | POA: Diagnosis not present

## 2019-12-01 DIAGNOSIS — H35371 Puckering of macula, right eye: Secondary | ICD-10-CM | POA: Diagnosis not present

## 2019-12-01 MED ORDER — AFLIBERCEPT 2MG/0.05ML IZ SOLN FOR KALEIDOSCOPE
2.0000 mg | INTRAVITREAL | Status: AC | PRN
  Administered 2019-12-01: 2 mg via INTRAVITREAL

## 2019-12-01 NOTE — Progress Notes (Signed)
12/01/2019     CHIEF COMPLAINT Patient presents for Diabetic Eye Exam   HISTORY OF PRESENT ILLNESS: Grace [name] is a 69 y.o. female who presents to the clinic today for:   HPI    Diabetic Eye Exam    Vision is stable.  Associated Symptoms Floaters.  Negative for Flashes and Distortion.  Diabetes characteristics include Type 2.  Blood sugar level is controlled.  Last Blood Glucose 189.  Last A1C 7.5.          Comments    6.5 week Diabetic f\u OS. Possible Eylea OS. OCT  Pt states vision is stable. Pt states she sees a "swirl"\floater in OD. Denies FOL  BGL: 189 this AM A1C: 7.5       Last edited by Tilda Franco on 12/01/2019  2:34 PM. (History)      Referring physician: Unk Pinto, MD [address] [place],  [place] [address]  HISTORICAL INFORMATION:   Selected notes from the MEDICAL RECORD NUMBER    Lab Results  Component Value Date   HGBA1C 7.7 (H) 11/10/2019     CURRENT MEDICATIONS: No current outpatient medications on file. (Ophthalmic Drugs)   No current facility-administered medications for this visit. (Ophthalmic Drugs)   Current Outpatient Medications (Other)  Medication Sig  . Ascorbic Acid (VITAMIN C PO) Take 1 tablet by mouth daily.  . Calcium Citrate-Vitamin D (CITRACAL/VITAMIN D PO) Take 1 tablet by mouth daily.  . DULoxetine (CYMBALTA) 30 MG capsule TAKE 1 CAPSULE BY MOUTH EVERY DAY  . glimepiride (AMARYL) 2 MG tablet Take 1 tablet 1 or 2 x /day with Meals for Diabetes  . glucose blood (ACCU-CHEK GUIDE) test strip Check blood sugar 3 times a day-DX-E11.69 AND E78.5.  . Magnesium 250 MG TABS Take 2 tablets by mouth daily.   . metFORMIN (GLUCOPHAGE-XR) 500 MG 24 hr tablet Take 2 tablets 2 x /day with meals for Diabetes  . zinc gluconate 50 MG tablet Take 50 mg by mouth daily.  . Cholecalciferol (VITAMIN D PO) Take 1,000 Units by mouth 2 (two) times daily. Reported on 02/07/2016  . rosuvastatin (CRESTOR) 20 MG tablet  Take 1 tablet Daily for Cholesterol (Patient not taking: Reported on 12/01/2019)   No current facility-administered medications for this visit. (Other)      REVIEW OF SYSTEMS: ROS    Positive for: Endocrine   Last edited by Tilda Franco on 12/01/2019  2:34 PM. (History)       ALLERGIES Allergies  Allergen Reactions  . Gabapentin Swelling    Swelling in legs and feet    PAST MEDICAL HISTORY Past Medical History:  Diagnosis Date  . Abnormality of gait 07/28/2013  . Diabetes ([place])   . Diabetic peripheral neuropathy ([place])   . Diabetic retinopathy ([place])   . Foot drop, bilateral 07/31/2014  . Peripheral edema   . Polyneuropathy in diabetes(357.2) 07/28/2013   Past Surgical History:  Procedure Laterality Date  . ABDOMINAL HYSTERECTOMY    . ANKLE FRACTURE SURGERY     left  . CATARACT EXTRACTION W/PHACO Right 02/21/2014   Dr. Herbert Deaner  . CATARACT EXTRACTION W/PHACO Left 04/10/2014   Dr. Herbert Deaner  . CATARACT EXTRACTION, BILATERAL    . HIP SURGERY     right  . TONSILLECTOMY      FAMILY HISTORY Family History  Problem Relation Age of Onset  . Diabetes Mother   . Diabetes Brother   . Neuropathy Neg Hx  SOCIAL HISTORY Social History   Tobacco Use  . Smoking status: Never Smoker  . Smokeless tobacco: Never Used  Substance Use Topics  . Alcohol use: No  . Drug use: No         OPHTHALMIC EXAM:  Base Eye Exam    Visual Acuity (Snellen - Linear)      Right Left   Dist [place] 20/40 -2 20/60   Dist ph [place] 20/25 -1 20/50 +       Tonometry (Tonopen, 2:44 PM)      Right Left   Pressure 15 16       Pupils      Pupils Dark Light Shape React APD   Right PERRL 2.5 2.5 Round Minimal None   Left PERRL 2.5 2.5 Round Minimal None       Visual Fields (Counting fingers)      Left Right    Full Full       Neuro/Psych    Oriented x3: Yes   Mood/Affect: Normal       Dilation    Left eye: 1.0% Mydriacyl, 2.5% Phenylephrine @ 2:44 PM        Slit Lamp  and Fundus Exam    External Exam      Right Left   External Normal Normal       Slit Lamp Exam      Right Left   Lids/Lashes Normal Normal   Conjunctiva/Sclera White and quiet White and quiet   Cornea Clear Clear   Anterior Chamber Deep and quiet Deep and quiet   Iris Round and reactive Round and reactive   Lens Posterior chamber intraocular lens Posterior chamber intraocular lens   Anterior Vitreous Normal Normal       Fundus Exam      Right Left   Posterior Vitreous  Posterior vitreous detachment   Disc  Normal   C/D Ratio  0.2-0.3   Macula  Microaneurysms, Macular thickening, Clinically significant macular edema, Mild clinically significant macular edema   Vessels  Severe nonproliferative diabetic retinopathy with retinal nonperfusion   Periphery  Good peripheral PRP          IMAGING AND PROCEDURES  Imaging and Procedures for 12/01/19  OCT, Retina - OU - Both Eyes       Right Eye Quality was good. Scan locations included subfoveal. Central Foveal Thickness: 290. Progression has improved. Findings include epiretinal membrane.   Left Eye Quality was good. Scan locations included subfoveal. Progression has improved. Findings include epiretinal membrane.   Notes OD with clinically significant macular edema now some 5 weeks after Eylea injection.  Minor epiretinal membrane nasal to FAZ  OS with improved clinically significant macular edema at 6 weeks status post Eylea injection, minor epiretinal membrane nasal to FAZ       Intravitreal Injection, Pharmacologic Agent - OS - Left Eye       Time Out 12/01/2019. 3:34 PM. Confirmed correct patient, procedure, site, and patient consented.   Anesthesia Topical anesthesia was used. Anesthetic medications included Akten 3.5%.   Procedure Preparation included 10% betadine to eyelids, 5% betadine to ocular surface, Ofloxacin . A 30 gauge needle was used.   Injection:  2 mg aflibercept [name]) SOLN   NDC:  [id number], Lot: [id number]   Route: Intravitreal, Site: Left Eye, Waste: 0 mg  Post-op Post injection exam found visual acuity of at least counting fingers. The patient tolerated the procedure well. There were no complications. The patient received written and  verbal post procedure care education. Post injection medications were not given.                 ASSESSMENT/PLAN:  No problem-specific Assessment & Plan notes found for this encounter.      ICD-10-CM   1. Proliferative diabetic retinopathy of left eye with macular edema associated with type 2 diabetes mellitus (HCC)  [id number] OCT, Retina - OU - Both Eyes    Intravitreal Injection, Pharmacologic Agent - OS - Left Eye    aflibercept (EYLEA) SOLN 2 mg  2. Diabetic macular edema of right eye with proliferative retinopathy associated with type 2 diabetes mellitus (HCC)  E11.3511 OCT, Retina - OU - Both Eyes  3. Right epiretinal membrane  H35.371   4. Macular pucker, left eye  H35.372     1.  2.  3.  Ophthalmic Meds Ordered this visit:  Meds ordered this encounter  Medications  . aflibercept (EYLEA) SOLN 2 mg       Return in about 6 weeks (around 01/12/2020) for EYLEA OCT.  There are no Patient Instructions on file for this visit.   Explained the diagnoses, plan, and follow up with the patient and they expressed understanding.  Patient expressed understanding of the importance of proper follow up care.   Clent Demark Mirakle Tomlin M.D. Diseases & Surgery of the Retina and Vitreous Retina & Diabetic [place] 12/01/19     Abbreviations: M myopia (nearsighted); A astigmatism; H hyperopia (farsighted); P presbyopia; Mrx spectacle prescription;  CTL contact lenses; OD right eye; OS left eye; OU both eyes  XT exotropia; ET esotropia; PEK punctate epithelial keratitis; PEE punctate epithelial erosions; DES dry eye syndrome; MGD meibomian gland dysfunction; ATs artificial tears; PFAT's preservative free artificial tears; [place]  nuclear sclerotic cataract; PSC posterior subcapsular cataract; ERM epi-retinal membrane; PVD posterior vitreous detachment; RD retinal detachment; DM diabetes mellitus; DR diabetic retinopathy; NPDR non-proliferative diabetic retinopathy; PDR proliferative diabetic retinopathy; CSME clinically significant macular edema; DME diabetic macular edema; dbh dot blot hemorrhages; CWS cotton wool spot; POAG primary open angle glaucoma; C/D cup-to-disc ratio; HVF humphrey visual field; GVF goldmann visual field; OCT optical coherence tomography; IOP intraocular pressure; BRVO Branch retinal vein occlusion; CRVO central retinal vein occlusion; CRAO central retinal artery occlusion; BRAO branch retinal artery occlusion; RT retinal tear; SB scleral buckle; PPV pars plana vitrectomy; VH Vitreous hemorrhage; PRP panretinal laser photocoagulation; IVK intravitreal kenalog; VMT vitreomacular traction; MH Macular hole;  NVD neovascularization of the disc; NVE neovascularization elsewhere; AREDS age related eye disease study; ARMD age related macular degeneration; POAG primary open angle glaucoma; EBMD epithelial/anterior basement membrane dystrophy; ACIOL anterior chamber intraocular lens; IOL intraocular lens; PCIOL posterior chamber intraocular lens; Phaco/IOL phacoemulsification with intraocular lens placement; [place] photorefractive keratectomy; LASIK laser assisted in situ keratomileusis; HTN hypertension; DM diabetes mellitus; COPD chronic obstructive pulmonary disease

## 2019-12-01 NOTE — Assessment & Plan Note (Signed)
The nature of diabetic macular edema was discussed with the patient. Treatment options were outlined including medical therapy, laser & vitrectomy. The use of injectable medications reviewed, including Avastin, Lucentis, and Eylea. Periodic injections into the eye are likely to resolve diabetic macular edema (swelling in the center of vision). Initially, injections are delivered are delivered every 4-6 weeks, and the interval extended as the condition improves. On average, 8-9 injections the first year, and 5 in year 2. Improvement in the condition most often improves on medical therapy. Occasional use of focal laser is also recommended for residual macular edema (swelling). Excellent control of blood glucose and blood pressure are encouraged under the care of a primary physician or endocrinologist. Similarly, attempts to maintain serum cholesterol, low density lipoproteins, and high-density lipoproteins in a favorable range were recommended.   The nature of regressed proliferative diabetic retinopathy was discussed with the patient. The patient was advised to maintain good glucose, blood pressure, monitor kidney function and serum lipid control as advised by personal physician. Rare risk for reactivation of progression exist with untreated severe anemia, untreated renal failure, untreated heart failure, and smoking. Complete avoidance of smoking was recommended. The chance of recurrent proliferative diabetic retinopathy was discussed as well as the chance of vitreous hemorrhage for which further treatments may be necessary.

## 2019-12-02 ENCOUNTER — Encounter: Payer: Self-pay | Admitting: *Deleted

## 2019-12-06 ENCOUNTER — Ambulatory Visit (INDEPENDENT_AMBULATORY_CARE_PROVIDER_SITE_OTHER): Payer: Medicare Other | Admitting: Ophthalmology

## 2019-12-06 ENCOUNTER — Other Ambulatory Visit: Payer: Self-pay

## 2019-12-06 ENCOUNTER — Encounter (INDEPENDENT_AMBULATORY_CARE_PROVIDER_SITE_OTHER): Payer: Self-pay | Admitting: Ophthalmology

## 2019-12-06 DIAGNOSIS — E113511 Type 2 diabetes mellitus with proliferative diabetic retinopathy with macular edema, right eye: Secondary | ICD-10-CM | POA: Diagnosis not present

## 2019-12-06 DIAGNOSIS — H35371 Puckering of macula, right eye: Secondary | ICD-10-CM | POA: Diagnosis not present

## 2019-12-06 MED ORDER — AFLIBERCEPT 2MG/0.05ML IZ SOLN FOR KALEIDOSCOPE
2.0000 mg | INTRAVITREAL | Status: AC | PRN
  Administered 2019-12-06: 2 mg via INTRAVITREAL

## 2019-12-06 NOTE — Progress Notes (Signed)
12/06/2019     CHIEF COMPLAINT Patient presents for Diabetic Retinopathy without Macular Edema and Retina Follow Up   HISTORY OF PRESENT ILLNESS: Grace [name] is a 69 y.o. female who presents to the clinic today for:   HPI    Retina Follow Up    Patient presents with  Diabetic Retinopathy.  In right eye.  Duration of 6 weeks.  Since onset it is stable.          Comments    6 week follow up - OCT OU, Possible Eylea OD Patient denies change in vision and overall has no complaints. LBS 117 Sunday morning A1C 7.25 Aug 2019       Last edited by Hurman Horn, MD on 12/06/2019  4:02 PM. (History)      Referring physician: Unk Pinto, MD [address] [place],  [place] [address]  HISTORICAL INFORMATION:   Selected notes from the MEDICAL RECORD NUMBER    Lab Results  Component Value Date   HGBA1C 7.7 (H) 11/10/2019     CURRENT MEDICATIONS: No current outpatient medications on file. (Ophthalmic Drugs)   No current facility-administered medications for this visit. (Ophthalmic Drugs)   Current Outpatient Medications (Other)  Medication Sig  . Ascorbic Acid (VITAMIN C PO) Take 1 tablet by mouth daily.  . Calcium Citrate-Vitamin D (CITRACAL/VITAMIN D PO) Take 1 tablet by mouth daily.  . Cholecalciferol (VITAMIN D PO) Take 1,000 Units by mouth 2 (two) times daily. Reported on 02/07/2016  . DULoxetine (CYMBALTA) 30 MG capsule TAKE 1 CAPSULE BY MOUTH EVERY DAY  . glimepiride (AMARYL) 2 MG tablet Take 1 tablet 1 or 2 x /day with Meals for Diabetes  . glucose blood (ACCU-CHEK GUIDE) test strip Check blood sugar 3 times a day-DX-E11.69 AND E78.5.  . Magnesium 250 MG TABS Take 2 tablets by mouth daily.   . metFORMIN (GLUCOPHAGE-XR) 500 MG 24 hr tablet Take 2 tablets 2 x /day with meals for Diabetes  . rosuvastatin (CRESTOR) 20 MG tablet Take 1 tablet Daily for Cholesterol  . zinc gluconate 50 MG tablet Take 50 mg by mouth daily.   No current  facility-administered medications for this visit. (Other)      REVIEW OF SYSTEMS:    ALLERGIES Allergies  Allergen Reactions  . Gabapentin Swelling    Swelling in legs and feet    PAST MEDICAL HISTORY Past Medical History:  Diagnosis Date  . Abnormality of gait 07/28/2013  . Diabetes ([place])   . Diabetic peripheral neuropathy ([place])   . Diabetic retinopathy ([place])   . Foot drop, bilateral 07/31/2014  . Peripheral edema   . Polyneuropathy in diabetes(357.2) 07/28/2013   Past Surgical History:  Procedure Laterality Date  . ABDOMINAL HYSTERECTOMY    . ANKLE FRACTURE SURGERY     left  . CATARACT EXTRACTION W/PHACO Right 02/21/2014   Dr. Herbert Deaner  . CATARACT EXTRACTION W/PHACO Left 04/10/2014   Dr. Herbert Deaner  . CATARACT EXTRACTION, BILATERAL    . HIP SURGERY     right  . TONSILLECTOMY      FAMILY HISTORY Family History  Problem Relation Age of Onset  . Diabetes Mother   . Diabetes Brother   . Neuropathy Neg Hx     SOCIAL HISTORY Social History   Tobacco Use  . Smoking status: Never Smoker  . Smokeless tobacco: Never Used  Substance Use Topics  . Alcohol use: No  . Drug use: No  OPHTHALMIC EXAM:  Base Eye Exam    Visual Acuity (Snellen - Linear)      Right Left   Dist [place] 20/40+2 20/60+1   Dist ph [place] 20/30+1 20/50-2       Tonometry (Tonopen, 3:26 PM)      Right Left   Pressure 16 14       Pupils      Pupils Dark Light Shape React APD   Right PERRL 3 3 Round Minimal None   Left PERRL 3 3 Round Minimal None       Visual Fields (Counting fingers)      Left Right    Full Full       Extraocular Movement      Right Left    Full Full       Neuro/Psych    Oriented x3: Yes   Mood/Affect: Normal       Dilation    Right eye: 2.5% Phenylephrine, 1.0% Mydriacyl @ 3:26 PM        Slit Lamp and Fundus Exam    External Exam      Right Left   External Normal Normal       Slit Lamp Exam      Right Left   Lids/Lashes Normal Normal    Conjunctiva/Sclera White and quiet White and quiet   Cornea Clear Clear   Anterior Chamber Deep and quiet Deep and quiet   Iris Round and reactive Round and reactive   Lens Posterior chamber intraocular lens Posterior chamber intraocular lens   Anterior Vitreous Normal Normal       Fundus Exam      Right Left   Posterior Vitreous Normal    Disc Normal    C/D Ratio 0.2    Macula Focal laser scars, Mild clinically significant macular edema    Vessels Quiescent proliferative diabetic retinopathy    Periphery PRP, a attached           IMAGING AND PROCEDURES  Imaging and Procedures for @TODAY @  OCT, Retina - OU - Both Eyes       Right Eye Quality was good. Scan locations included subfoveal. Progression has been stable. Findings include epiretinal membrane.   Left Eye Progression has improved.   Notes OD with chronic clinically significant macular edema, recurrent office visit at 6-week interval.  Repeat Eylea today .  epiretinal membrane nasal is minor  OS improved after repeat Eylea, much less retinal thickening from central clinically significant macular edema       Intravitreal Injection, Pharmacologic Agent - OD - Right Eye       Time Out 12/06/2019. 4:09 PM. Confirmed correct patient, procedure, site, and patient consented.   Anesthesia Topical anesthesia was used. Anesthetic medications included Akten 3.5%.   Procedure Preparation included Ofloxacin , 10% betadine to eyelids. A 30 gauge needle was used.   Injection:  2 mg aflibercept [name]) SOLN   NDC: [id number], Lot: [id number]   Route: Intravitreal, Site: Right Eye, Waste: 0 mg  Post-op Post injection exam found visual acuity of at least counting fingers. The patient tolerated the procedure well. There were no complications. The patient received written and verbal post procedure care education. Post injection medications were not given.                 ASSESSMENT/PLAN:  No problem-specific  Assessment & Plan notes found for this encounter.      ICD-10-CM   1. Diabetic macular edema of right eye  with proliferative retinopathy associated with type 2 diabetes mellitus (HCC)  E11.3511 OCT, Retina - OU - Both Eyes    Intravitreal Injection, Pharmacologic Agent - OD - Right Eye    aflibercept (EYLEA) SOLN 2 mg  2. Right epiretinal membrane  H35.371     1.  Clinically symptomatic edema the right eye is continuing to remain stable at a 6-week interval, will repeat Eylea use today OD.  2.  OD with minor epiretinal membrane will observe  3.  OS, recent Eylea successfully improved retinal thickening substantially, repeat exam as scheduled  Ophthalmic Meds Ordered this visit:  Meds ordered this encounter  Medications  . aflibercept (EYLEA) SOLN 2 mg       Return in about 7 weeks (around 01/24/2020) for EYLEA OCT, OD.  Patient Instructions  The nature of regressed proliferative diabetic retinopathy was discussed with the patient. The patient was advised to maintain good glucose, blood pressure, monitor kidney function and serum lipid control as advised by personal physician. Rare risk for reactivation of progression exist with untreated severe anemia, untreated renal failure, untreated heart failure, and smoking. Complete avoidance of smoking was recommended. The chance of recurrent proliferative diabetic retinopathy was discussed as well as the chance of vitreous hemorrhage for which further treatments may be necessary.   The nature of diabetic macular edema was discussed with the patient. Treatment options were outlined including medical therapy, laser & vitrectomy. The use of injectable medications reviewed, including Avastin, Lucentis, and Eylea. Periodic injections into the eye are likely to resolve diabetic macular edema (swelling in the center of vision). Initially, injections are delivered are delivered every 4-6 weeks, and the interval extended as the condition improves. On  average, 8-9 injections the first year, and 5 in year 2. Improvement in the condition most often improves on medical therapy. Occasional use of focal laser is also recommended for residual macular edema (swelling). Excellent control of blood glucose and blood pressure are encouraged under the care of a primary physician or endocrinologist. Similarly, attempts to maintain serum cholesterol, low density lipoproteins, and high-density lipoproteins in a favorable range were recommended.  The nature of diabetic macular edema was discussed with the patient. Treatment options were outlined including medical therapy, laser & vitrectomy. The use of injectable medications reviewed, including Avastin, Lucentis, and Eylea. Periodic injections into the eye are likely to resolve diabetic macular edema (swelling in the center of vision). Initially, injections are delivered are delivered every 4-6 weeks, and the interval extended as the condition improves. On average, 8-9 injections the first year, and 5 in year 2. Improvement in the condition most often improves on medical therapy. Occasional use of focal laser is also recommended for residual macular edema (swelling). Excellent control of blood glucose and blood pressure are encouraged under the care of a primary physician or endocrinologist. Similarly, attempts to maintain serum cholesterol, low density lipoproteins, and high-density lipoproteins in a favorable range were recommended.     Explained the diagnoses, plan, and follow up with the patient and they expressed understanding.  Patient expressed understanding of the importance of proper follow up care.   Clent Demark Bharath Bernstein M.D. Diseases & Surgery of the Retina and Vitreous Retina & Diabetic [place] @TODAY @     Abbreviations: M myopia (nearsighted); A astigmatism; H hyperopia (farsighted); P presbyopia; Mrx spectacle prescription;  CTL contact lenses; OD right eye; OS left eye; OU both eyes  XT exotropia; ET  esotropia; PEK punctate epithelial keratitis; PEE punctate epithelial erosions; DES  dry eye syndrome; MGD meibomian gland dysfunction; ATs artificial tears; PFAT's preservative free artificial tears; [place] nuclear sclerotic cataract; PSC posterior subcapsular cataract; ERM epi-retinal membrane; PVD posterior vitreous detachment; RD retinal detachment; DM diabetes mellitus; DR diabetic retinopathy; NPDR non-proliferative diabetic retinopathy; PDR proliferative diabetic retinopathy; CSME clinically significant macular edema; DME diabetic macular edema; dbh dot blot hemorrhages; CWS cotton wool spot; POAG primary open angle glaucoma; C/D cup-to-disc ratio; HVF humphrey visual field; GVF goldmann visual field; OCT optical coherence tomography; IOP intraocular pressure; BRVO Branch retinal vein occlusion; CRVO central retinal vein occlusion; CRAO central retinal artery occlusion; BRAO branch retinal artery occlusion; RT retinal tear; SB scleral buckle; PPV pars plana vitrectomy; VH Vitreous hemorrhage; PRP panretinal laser photocoagulation; IVK intravitreal kenalog; VMT vitreomacular traction; MH Macular hole;  NVD neovascularization of the disc; NVE neovascularization elsewhere; AREDS age related eye disease study; ARMD age related macular degeneration; POAG primary open angle glaucoma; EBMD epithelial/anterior basement membrane dystrophy; ACIOL anterior chamber intraocular lens; IOL intraocular lens; PCIOL posterior chamber intraocular lens; Phaco/IOL phacoemulsification with intraocular lens placement; [place] photorefractive keratectomy; LASIK laser assisted in situ keratomileusis; HTN hypertension; DM diabetes mellitus; COPD chronic obstructive pulmonary disease

## 2019-12-06 NOTE — Patient Instructions (Signed)
The nature of regressed proliferative diabetic retinopathy was discussed with the patient. The patient was advised to maintain good glucose, blood pressure, monitor kidney function and serum lipid control as advised by personal physician. Rare risk for reactivation of progression exist with untreated severe anemia, untreated renal failure, untreated heart failure, and smoking. Complete avoidance of smoking was recommended. The chance of recurrent proliferative diabetic retinopathy was discussed as well as the chance of vitreous hemorrhage for which further treatments may be necessary.   The nature of diabetic macular edema was discussed with the patient. Treatment options were outlined including medical therapy, laser & vitrectomy. The use of injectable medications reviewed, including Avastin, Lucentis, and Eylea. Periodic injections into the eye are likely to resolve diabetic macular edema (swelling in the center of vision). Initially, injections are delivered are delivered every 4-6 weeks, and the interval extended as the condition improves. On average, 8-9 injections the first year, and 5 in year 2. Improvement in the condition most often improves on medical therapy. Occasional use of focal laser is also recommended for residual macular edema (swelling). Excellent control of blood glucose and blood pressure are encouraged under the care of a primary physician or endocrinologist. Similarly, attempts to maintain serum cholesterol, low density lipoproteins, and high-density lipoproteins in a favorable range were recommended.  The nature of diabetic macular edema was discussed with the patient. Treatment options were outlined including medical therapy, laser & vitrectomy. The use of injectable medications reviewed, including Avastin, Lucentis, and Eylea. Periodic injections into the eye are likely to resolve diabetic macular edema (swelling in the center of vision). Initially, injections are delivered are  delivered every 4-6 weeks, and the interval extended as the condition improves. On average, 8-9 injections the first year, and 5 in year 2. Improvement in the condition most often improves on medical therapy. Occasional use of focal laser is also recommended for residual macular edema (swelling). Excellent control of blood glucose and blood pressure are encouraged under the care of a primary physician or endocrinologist. Similarly, attempts to maintain serum cholesterol, low density lipoproteins, and high-density lipoproteins in a favorable range were recommended.

## 2019-12-08 ENCOUNTER — Other Ambulatory Visit: Payer: Self-pay

## 2019-12-08 ENCOUNTER — Ambulatory Visit (INDEPENDENT_AMBULATORY_CARE_PROVIDER_SITE_OTHER): Payer: Medicare Other | Admitting: Neurology

## 2019-12-08 ENCOUNTER — Encounter: Payer: Self-pay | Admitting: Neurology

## 2019-12-08 VITALS — BP 151/66 | HR 67 | Temp 96.9°F | Ht 63.5 in | Wt 171.0 lb

## 2019-12-08 DIAGNOSIS — G2581 Restless legs syndrome: Secondary | ICD-10-CM | POA: Diagnosis not present

## 2019-12-08 DIAGNOSIS — R0683 Snoring: Secondary | ICD-10-CM | POA: Diagnosis not present

## 2019-12-08 DIAGNOSIS — E1140 Type 2 diabetes mellitus with diabetic neuropathy, unspecified: Secondary | ICD-10-CM

## 2019-12-08 DIAGNOSIS — E1121 Type 2 diabetes mellitus with diabetic nephropathy: Secondary | ICD-10-CM

## 2019-12-08 DIAGNOSIS — E113511 Type 2 diabetes mellitus with proliferative diabetic retinopathy with macular edema, right eye: Secondary | ICD-10-CM

## 2019-12-08 DIAGNOSIS — G478 Other sleep disorders: Secondary | ICD-10-CM

## 2019-12-08 DIAGNOSIS — H353232 Exudative age-related macular degeneration, bilateral, with inactive choroidal neovascularization: Secondary | ICD-10-CM | POA: Diagnosis not present

## 2019-12-08 DIAGNOSIS — E1151 Type 2 diabetes mellitus with diabetic peripheral angiopathy without gangrene: Secondary | ICD-10-CM

## 2019-12-08 DIAGNOSIS — H35371 Puckering of macula, right eye: Secondary | ICD-10-CM | POA: Diagnosis not present

## 2019-12-08 DIAGNOSIS — E113512 Type 2 diabetes mellitus with proliferative diabetic retinopathy with macular edema, left eye: Secondary | ICD-10-CM | POA: Diagnosis not present

## 2019-12-08 NOTE — Progress Notes (Signed)
SLEEP MEDICINE CLINIC    Provider:  Larey Seat, MD  Primary Care Physician:  Unk Pinto, MD [address] [place] [address]     Referring Provider: Dr. [name], MD Eye center          Chief Complaint according to patient   Patient presents with:    . New Patient (Initial Visit)     Pt is established with Dr Jannifer Franklin, who follows her for diabetic neuropathy. She had gestational DM in pregnancy 35 years ago. She  presents today by referral of Dr Deloria Lair,  eye specialist. He recommended her evaluating OSA. She never had a PSG. pt has been told she snores but unsure of apnea events.       HISTORY OF PRESENT ILLNESS:  Grace [name] is a 69  year old Caucasian female patient  is seen here upon a referral on 12/08/2019 .  Chief concern according to patient :  I don't think I have Apnea , but my eye doctor is concerned that my diabetic eye disease is related to untreated OSA".    I have the pleasure of seeing Grace [name] on 12-08-2019, a right -handed Caucasian female with a possible sleep disorder.   She has a past medical history of  Diabetic peripheral neuropathy ([place]), Diabetic retinopathy ([place]), Foot drop, bilateral (07/31/2014), Peripheral edema, and is followed since 2014 by Dr. Jannifer Franklin.  She has had cataract and diabetic retinopathy has been diagnosed over 4 years ago, has worsened.    Sleep relevant medical history: Nocturia every 2 hours- Tonsillectomy in childhood.   Family medical /sleep history:  brotheron CPAP with OSA.    Social history:  Patient is working as a Network engineer at a non Therapist, music. She lives in a household with her spouse.   2 adult children,3 grandchildren.  Pets are not  present. Tobacco use- none.  ETOH use; none , Caffeine intake in form of Coffee(3 / all day) Soda( none) Tea ( no longer). No energy drinks. No regular exercise- she has bilateral foot drop- neuropathy, vision restriction.     Sleep habits are as  follows: The patient's dinner time is between 5 PM. The patient goes to bed at 10 PM and continues to wake up every 2 hours, using these beaks for a bathroom trip- nocturia doesn't wake her. Total sleep time per night is about 6 hours. The preferred sleep position is on either side-, with the support of one  pillows. Dreams are reportedly rare.  7 AM is the usual rise time.  The patient wakes up spontaneously at 7 AM.  She reports not feeling refreshed or restored in AM, with symptoms such as dry mouth, but no morning headaches. Naps are taken daily , lasting from 45-60 minutes and are more refreshing than nocturnal sleep.    Review of Systems: Out of a complete 14 system review, the patient complains of only the following symptoms, and all other reviewed systems are negative.:  Fatigue, sleepiness , snoring, fragmented sleep- chronic pain, discomfort.    How likely are you to doze in the following situations: 0 = not likely, 1 = slight chance, 2 = moderate chance, 3 = high chance   Sitting and Reading? Watching Television? Sitting inactive in a public place (theater or meeting)? As a passenger in a car for an hour without a break? Lying down in the afternoon when circumstances permit? Sitting and talking to someone? Sitting quietly after lunch without  alcohol? In a car, while stopped for a few minutes in traffic?   Total = 15/ 24 points   FSS endorsed at 52/ 63 points.  She appears depressed , defeated. GDS was 6/ 15 points.    Social History   Socioeconomic History  . Marital status: Married    Spouse name: Not on file  . Number of children: 2  . Years of education: college  . Highest education level: Not on file  Occupational History  . Occupation: part time  Tobacco Use  . Smoking status: Never Smoker  . Smokeless tobacco: Never Used  Substance and Sexual Activity  . Alcohol use: No  . Drug use: No  . Sexual activity: Not on file  Other Topics Concern  . Not on file    Social History Narrative   Patient is right handed.   Patient drinks about 4 cups of tea daily.   Social Determinants of Health   Financial Resource Strain:   . Difficulty of Paying Living Expenses:   Food Insecurity:   . Worried About Charity fundraiser in the Last Year:   . Arboriculturist in the Last Year:   Transportation Needs:   . Film/video editor (Medical):   Marland Kitchen Lack of Transportation (Non-Medical):   Physical Activity:   . Days of Exercise per Week:   . Minutes of Exercise per Session:   Stress:   . Feeling of Stress :   Social Connections:   . Frequency of Communication with Friends and Family:   . Frequency of Social Gatherings with Friends and Family:   . Attends Religious Services:   . Active Member of Clubs or Organizations:   . Attends Archivist Meetings:   Marland Kitchen Marital Status:     Family History  Problem Relation Age of Onset  . Diabetes Mother   . Diabetes Brother   . Neuropathy Neg Hx     Past Medical History:  Diagnosis Date  . Abnormality of gait 07/28/2013  . Diabetes ([place])   . Diabetic peripheral neuropathy ([place])   . Diabetic retinopathy ([place])   . Foot drop, bilateral 07/31/2014  . Peripheral edema   . Polyneuropathy in diabetes(357.2) 07/28/2013    Past Surgical History:  Procedure Laterality Date  . ABDOMINAL HYSTERECTOMY    . ANKLE FRACTURE SURGERY     left  . CATARACT EXTRACTION W/PHACO Right 02/21/2014   Dr. Herbert Deaner  . CATARACT EXTRACTION W/PHACO Left 04/10/2014   Dr. Herbert Deaner  . CATARACT EXTRACTION, BILATERAL    . HIP SURGERY     right  . TONSILLECTOMY       Current Outpatient Medications on File Prior to Visit  Medication Sig Dispense Refill  . Ascorbic Acid (VITAMIN C PO) Take 1 tablet by mouth daily.    . Calcium Citrate-Vitamin D (CITRACAL/VITAMIN D PO) Take 1 tablet by mouth daily.    . Cholecalciferol (VITAMIN D PO) Take 1,000 Units by mouth 2 (two) times daily. Reported on 02/07/2016    . DULoxetine  (CYMBALTA) 30 MG capsule TAKE 1 CAPSULE BY MOUTH EVERY DAY 90 capsule 3  . glimepiride (AMARYL) 2 MG tablet Take 1 tablet 1 or 2 x /day with Meals for Diabetes 180 tablet 3  . glucose blood (ACCU-CHEK GUIDE) test strip Check blood sugar 3 times a day-DX-E11.69 AND E78.5. 100 each 12  . Magnesium 250 MG TABS Take 2 tablets by mouth daily.     . metFORMIN (GLUCOPHAGE-XR) 500 MG  24 hr tablet Take 2 tablets 2 x /day with meals for Diabetes 360 tablet 3  . zinc gluconate 50 MG tablet Take 50 mg by mouth daily.     No current facility-administered medications on file prior to visit.    Allergies  Allergen Reactions  . Gabapentin Swelling    Swelling in legs and feet    Physical exam:  Today's Vitals   12/08/19 0846  BP: (!) 151/66  Pulse: 67  Temp: (!) 96.9 F (36.1 C)  Weight: 171 lb (77.6 kg)  Height: 5' 3.5" (1.613 m)   Body mass index is 29.82 kg/m.   Wt Readings from Last 3 Encounters:  12/08/19 171 lb (77.6 kg)  11/10/19 175 lb (79.4 kg)  08/09/19 171 lb 6.4 oz (77.7 kg)     Ht Readings from Last 3 Encounters:  12/08/19 5' 3.5" (1.613 m)  11/10/19 5' 4.5" (1.638 m)  08/09/19 5\' 5"  (1.651 m)      She got both Covid19 vaccines-  General: The patient is awake, alert and appears not in acute distress. The patient is well groomed. Head: Normocephalic, atraumatic. Neck is supple.  Mallampati 3 - stubby and edematous. GERD ?   neck circumference:16 inches . Nasal airflow patent.  Retrognathia is seen.  Dental status:  Cardiovascular:  Regular rate and cardiac rhythm by pulse,  without distended neck veins. Respiratory: Lungs are clear to auscultation.  Skin:  Without evidence of ankle edema, or rash. Trunk: The patient's posture is erect.   Neurologic exam : The patient is awake and alert, oriented to place and time.   Memory subjective described as intact.  Attention span & concentration ability appears normal.  Speech is fluent,  without dysarthria, dysphonia or  aphasia.  Mood and affect are depressed.   Cranial nerves: no loss of smell or taste reported  Pupils are equal in size and round ,but not briskly reactive to light. Funduscopic exam - multiple laser cars, pallor, worse in the left.   Extraocular movements in vertical and horizontal planes were intact and without nystagmus. No Diplopia. Visual fields by finger perimetry are intact. severely decreased visual acuity reported.  Hearing was intact to soft voice and finger rubbing.    Facial sensation intact to fine touch.  Facial motor strength is symmetric and tongue and uvula move midline.  Neck ROM : rotation, tilt and flexion extension were normal for age and shoulder shrug was symmetrical.    Motor exam:  Symmetric bulk, tone and ROM.  I only evaluated the upper extremities, shoulder.  Normal tone without cog wheeling, symmetric grip strength .   Sensory:  Fine touch, pinprick and vibration were normal in both hands.  Proprioception tested in the upper extremities was normal. Well documented neuropathy -up to the knee bilaterally and painful sensation in both thighs. She follows Dr Jannifer Franklin.     Coordination: Rapid alternating movements in the fingers/hands were of normal speed.  The Finger-to-nose maneuver was intact without evidence of ataxia, dysmetria or tremor.   Gait and station: Patient could rise unassisted from a seated position, she walked without assistive device, but both feet are in AFL..  Stance is of normal width/ base . Deep tendon reflexes: in the upper extremities are symmetric and intact.  Babinski response was deferred.     Grace [name] has been treated by Dr. [name] for diabetic retinopathy and is receiving injections for at least 3 years.  It was Dr. [name] who asked her to have  a sleep study and to see if there is a problem of sleep apnea and hypoxemia in the background.  The patient has a history of hypertension hyperlipidemia, type 2 diabetes mellitus also she had  already declared herself at risk for diabetes when she was pregnant 35 years ago and developed gestational diabetes.  She has diabetic nephropathy, diabetic retinopathy and polyperipheral neuropathy.  She has wet macular degeneration.  And in the past has been vitamin D deficient.  Hypertension has been present since the 1960s and has been good controlled.  She has no dyspnea orthopnea her ankles do not appear to be swollen, there is no evidence of neural or vascular claudication.  She has had this in December and elevated hemoglobin A1c while on Glucophage 500 mg 2 tablets twice a day.  She is taking zinc gluconate, Amaryl, magnesium daily, Cymbalta prescribed by Dr. Jannifer Franklin for the neuropathic pain, she is on vitamin D supplement vitamin C supplement.  In the past she responded to gabapentin the swelling in her legs.  Dr. Janit Pagan her primary care physician arranged for this consultation for possible sleep apnea.   After spending a total time of  46  minutes face to face and additional time for physical and neurologic examination, review of laboratory studies,  personal review of imaging studies, reports and results of other testing and review of referral information / records as far as provided in visit, I have established the following assessments:  My Plan is to proceed with:   1)  I would prefer to just screen for OSA- by either HST or in lab- depending on insurance.    I would like to thank Dr [name] , MD and Unk Pinto, [place],  [place] [address] for allowing me to meet with and to take care of this pleasant patient.   I plan to follow up either personally or through our NP within 2-3 month, if apnea is present (!).   CC: I will share my notes with her primary neurologist , dr Jannifer Franklin.   Electronically signed by: Larey Seat, MD 12/08/2019 9:10 AM  [facility] Board certified by The [employer] of Sleep  Medicine and Diplomate of the [employer] of Sleep Medicine. Board certified In Neurology through the [place], Fellow of the [employer] of Neurology. Medical Director of [facility].

## 2019-12-08 NOTE — Patient Instructions (Signed)

## 2019-12-15 ENCOUNTER — Telehealth: Payer: Self-pay

## 2019-12-15 NOTE — Telephone Encounter (Signed)
LM with husband for pt to call me back to schedule sleep study

## 2019-12-22 ENCOUNTER — Other Ambulatory Visit: Payer: Self-pay | Admitting: *Deleted

## 2019-12-22 ENCOUNTER — Other Ambulatory Visit: Payer: Self-pay | Admitting: Internal Medicine

## 2019-12-22 ENCOUNTER — Other Ambulatory Visit: Payer: Self-pay

## 2019-12-22 ENCOUNTER — Ambulatory Visit (INDEPENDENT_AMBULATORY_CARE_PROVIDER_SITE_OTHER): Payer: Medicare Other

## 2019-12-22 VITALS — BP 120/80 | HR 86 | Temp 97.5°F | Wt 174.0 lb

## 2019-12-22 DIAGNOSIS — I10 Essential (primary) hypertension: Secondary | ICD-10-CM | POA: Diagnosis not present

## 2019-12-22 DIAGNOSIS — N1832 Chronic kidney disease, stage 3b: Secondary | ICD-10-CM

## 2019-12-22 DIAGNOSIS — Z79899 Other long term (current) drug therapy: Secondary | ICD-10-CM

## 2019-12-22 DIAGNOSIS — E1122 Type 2 diabetes mellitus with diabetic chronic kidney disease: Secondary | ICD-10-CM

## 2019-12-22 DIAGNOSIS — N1831 Chronic kidney disease, stage 3a: Secondary | ICD-10-CM

## 2019-12-22 DIAGNOSIS — R300 Dysuria: Secondary | ICD-10-CM

## 2019-12-22 DIAGNOSIS — E1121 Type 2 diabetes mellitus with diabetic nephropathy: Secondary | ICD-10-CM | POA: Diagnosis not present

## 2019-12-22 NOTE — Progress Notes (Signed)
Pt presents for URINE & LABS per office note. Vitals entered into EPIC Questions addressed at the time of the visit

## 2019-12-24 LAB — URINALYSIS, ROUTINE W REFLEX MICROSCOPIC
Bacteria, UA: NONE SEEN /HPF
Bilirubin Urine: NEGATIVE
Hyaline Cast: NONE SEEN /LPF
Ketones, ur: NEGATIVE
Nitrite: NEGATIVE
Protein, ur: NEGATIVE
Specific Gravity, Urine: 1.015 (ref 1.001–1.03)
WBC, UA: >=60 /HPF — AB (ref 0–5)
pH: <=5 (ref 5.0–8.0)

## 2019-12-24 LAB — COMPLETE METABOLIC PANEL WITH GFR
AG Ratio: 1.6 (calc) (ref 1.0–2.5)
ALT: 19 U/L (ref 6–29)
AST: 15 U/L (ref 10–35)
Albumin: 3.8 g/dL (ref 3.6–5.1)
Alkaline phosphatase (APISO): 64 U/L (ref 37–153)
BUN/Creatinine Ratio: 25 (calc) — ABNORMAL HIGH (ref 6–22)
BUN: 25 mg/dL (ref 7–25)
CO2: 30 mmol/L (ref 20–32)
Calcium: 10 mg/dL (ref 8.6–10.4)
Chloride: 100 mmol/L (ref 98–110)
Creat: 1.01 mg/dL — ABNORMAL HIGH (ref 0.50–0.99)
GFR, Est African American: 66 mL/min/{1.73_m2} (ref >=60)
GFR, Est Non African American: 57 mL/min/{1.73_m2} — ABNORMAL LOW (ref >=60)
Globulin: 2.4 g/dL (calc) (ref 1.9–3.7)
Glucose, Bld: 134 mg/dL — ABNORMAL HIGH (ref 65–99)
Potassium: 5.4 mmol/L — ABNORMAL HIGH (ref 3.5–5.3)
Sodium: 138 mmol/L (ref 135–146)
Total Bilirubin: 0.3 mg/dL (ref 0.2–1.2)
Total Protein: 6.2 g/dL (ref 6.1–8.1)

## 2019-12-24 LAB — CBC WITH DIFFERENTIAL/PLATELET
Absolute Monocytes: 720 cells/uL (ref 200–950)
Basophils Absolute: 38 cells/uL (ref 0–200)
Basophils Relative: 0.4 %
Eosinophils Absolute: 240 cells/uL (ref 15–500)
Eosinophils Relative: 2.5 %
HCT: 36.4 % (ref 35.0–45.0)
Hemoglobin: 11.9 g/dL (ref 11.7–15.5)
Lymphs Abs: 2688 cells/uL (ref 850–3900)
MCH: 29.5 pg (ref 27.0–33.0)
MCHC: 32.7 g/dL (ref 32.0–36.0)
MCV: 90.1 fL (ref 80.0–100.0)
MPV: 9.4 fL (ref 7.5–12.5)
Monocytes Relative: 7.5 %
Neutro Abs: 5914 cells/uL (ref 1500–7800)
Neutrophils Relative %: 61.6 %
Platelets: 417 10*3/uL — ABNORMAL HIGH (ref 140–400)
RBC: 4.04 10*6/uL (ref 3.80–5.10)
RDW: 13.4 % (ref 11.0–15.0)
Total Lymphocyte: 28 %
WBC: 9.6 10*3/uL (ref 3.8–10.8)

## 2019-12-24 LAB — URINE CULTURE
MICRO NUMBER:: 10448792
SPECIMEN QUALITY:: ADEQUATE

## 2019-12-28 ENCOUNTER — Other Ambulatory Visit: Payer: Self-pay | Admitting: Adult Health

## 2019-12-28 ENCOUNTER — Telehealth: Payer: Self-pay | Admitting: *Deleted

## 2019-12-28 MED ORDER — SULFAMETHOXAZOLE-TRIMETHOPRIM 800-160 MG PO TABS: 1.0000 | ORAL_TABLET | Freq: Two times a day (BID) | ORAL | 0 refills | Status: DC

## 2019-12-28 NOTE — Telephone Encounter (Signed)
Per Liane Comber, NP, the patient was informed of lab results. Her kidney functions were stable and slightly improved, potassium is improve and still slightly up, so she was advised to increase her water intake. The patient did have a urine infection and an RX for [place] was sent to her pharmacy by Liane Comber, NP. Patient is aware.

## 2020-01-09 ENCOUNTER — Ambulatory Visit (INDEPENDENT_AMBULATORY_CARE_PROVIDER_SITE_OTHER): Payer: Medicare Other | Admitting: Neurology

## 2020-01-09 ENCOUNTER — Other Ambulatory Visit: Payer: Self-pay

## 2020-01-09 DIAGNOSIS — R0683 Snoring: Secondary | ICD-10-CM

## 2020-01-09 DIAGNOSIS — E113511 Type 2 diabetes mellitus with proliferative diabetic retinopathy with macular edema, right eye: Secondary | ICD-10-CM

## 2020-01-09 DIAGNOSIS — G478 Other sleep disorders: Secondary | ICD-10-CM

## 2020-01-09 DIAGNOSIS — G4733 Obstructive sleep apnea (adult) (pediatric): Secondary | ICD-10-CM

## 2020-01-09 DIAGNOSIS — H353232 Exudative age-related macular degeneration, bilateral, with inactive choroidal neovascularization: Secondary | ICD-10-CM

## 2020-01-09 DIAGNOSIS — E1151 Type 2 diabetes mellitus with diabetic peripheral angiopathy without gangrene: Secondary | ICD-10-CM

## 2020-01-09 DIAGNOSIS — E113512 Type 2 diabetes mellitus with proliferative diabetic retinopathy with macular edema, left eye: Secondary | ICD-10-CM

## 2020-01-12 ENCOUNTER — Encounter (INDEPENDENT_AMBULATORY_CARE_PROVIDER_SITE_OTHER): Payer: Medicare Other | Admitting: Ophthalmology

## 2020-01-18 NOTE — Progress Notes (Signed)
PATIENT: Grace [name] DOB: 1951/08/09  REASON FOR VISIT: follow up HISTORY FROM: patient  HISTORY OF PRESENT ILLNESS: Today 01/19/20  Grace [name] is a 69 year old female with history of diabetes and diabetic peripheral neuropathy.  She remains on low-dose Cymbalta 30 mg daily.  Has recently seen Dr. Brett Fairy for evaluation of OSA, pending sleep study.  With neuropathy, she has mostly numbness, no pain per se.  Symptoms are currently well controlled with Cymbalta.  No recent falls.  She wears braces on both lower legs for stability.  She uses a walker.  She does not drive, mostly due to macular degeneration. Most recent A1C 7.7. Presents today for evaluation unaccompanied.  HISTORY  01/17/2019 Dr. Jannifer Franklin: Grace [name] is a 69 year old right-handed white female with a history of diabetes with diabetic peripheral neuropathy.  Her last hemoglobin A1c was 8.4.  The patient has done fairly well for long time frame on low-dose Cymbalta taking 30 mg daily.  The patient is sleeping well, she does have some gait instability, she wears braces on both feet.  She does have some numbness of the left index finger but otherwise her hands are unaffected.  She may feel some numbness up to the knee level in the legs.  She does fall on occasion, she may use a cane or a walker when outside of the house.  She uses a shower chair to help prevent falls.  She returns for an evaluation.  REVIEW OF SYSTEMS: Out of a complete 14 system review of symptoms, the patient complains only of the following symptoms, and all other reviewed systems are negative.  Numbness  ALLERGIES: Allergies  Allergen Reactions  . Gabapentin Swelling    Swelling in legs and feet    HOME MEDICATIONS: Outpatient Medications Prior to Visit  Medication Sig Dispense Refill  . Ascorbic Acid (VITAMIN C PO) Take 1 tablet by mouth daily.    . Calcium Citrate-Vitamin D (CITRACAL/VITAMIN D PO) Take 1 tablet by mouth daily.    .  Cholecalciferol (VITAMIN D PO) Take 1,000 Units by mouth 2 (two) times daily. Reported on 02/07/2016    . glimepiride (AMARYL) 2 MG tablet Take 1 tablet 1 or 2 x /day with Meals for Diabetes 180 tablet 3  . glucose blood (ACCU-CHEK GUIDE) test strip Check blood sugar 3 times a day-DX-E11.69 AND E78.5. 100 each 12  . Magnesium 250 MG TABS Take 2 tablets by mouth daily.     . metFORMIN (GLUCOPHAGE-XR) 500 MG 24 hr tablet Take 2 tablets 2 x /day with meals for Diabetes 360 tablet 3  . zinc gluconate 50 MG tablet Take 50 mg by mouth daily.    . DULoxetine (CYMBALTA) 30 MG capsule TAKE 1 CAPSULE BY MOUTH EVERY DAY 90 capsule 3  . sulfamethoxazole-trimethoprim (BACTRIM DS) 800-160 MG tablet Take 1 tablet by mouth 2 (two) times daily. 10 tablet 0   No facility-administered medications prior to visit.    PAST MEDICAL HISTORY: Past Medical History:  Diagnosis Date  . Abnormality of gait 07/28/2013  . Diabetes ([place])   . Diabetic peripheral neuropathy ([place])   . Diabetic retinopathy ([place])   . Foot drop, bilateral 07/31/2014  . Peripheral edema   . Polyneuropathy in diabetes(357.2) 07/28/2013    PAST SURGICAL HISTORY: Past Surgical History:  Procedure Laterality Date  . ABDOMINAL HYSTERECTOMY    . ANKLE FRACTURE SURGERY     left  . CATARACT EXTRACTION W/PHACO Right 02/21/2014   Dr. Herbert Deaner  . CATARACT  EXTRACTION W/PHACO Left 04/10/2014   Dr. Herbert Deaner  . CATARACT EXTRACTION, BILATERAL    . HIP SURGERY     right  . TONSILLECTOMY      FAMILY HISTORY: Family History  Problem Relation Age of Onset  . Diabetes Mother   . Diabetes Brother   . Neuropathy Neg Hx     SOCIAL HISTORY: Social History   Socioeconomic History  . Marital status: Married    Spouse name: Not on file  . Number of children: 2  . Years of education: college  . Highest education level: Not on file  Occupational History  . Occupation: part time  Tobacco Use  . Smoking status: Never Smoker  . Smokeless tobacco:  Never Used  Substance and Sexual Activity  . Alcohol use: No  . Drug use: No  . Sexual activity: Not on file  Other Topics Concern  . Not on file  Social History Narrative   Patient is right handed.   Patient drinks about 4 cups of tea daily.   Social Determinants of Health   Financial Resource Strain:   . Difficulty of Paying Living Expenses:   Food Insecurity:   . Worried About Charity fundraiser in the Last Year:   . Arboriculturist in the Last Year:   Transportation Needs:   . Film/video editor (Medical):   Marland Kitchen Lack of Transportation (Non-Medical):   Physical Activity:   . Days of Exercise per Week:   . Minutes of Exercise per Session:   Stress:   . Feeling of Stress :   Social Connections:   . Frequency of Communication with Friends and Family:   . Frequency of Social Gatherings with Friends and Family:   . Attends Religious Services:   . Active Member of Clubs or Organizations:   . Attends Archivist Meetings:   Marland Kitchen Marital Status:   Intimate Partner Violence:   . Fear of Current or Ex-Partner:   . Emotionally Abused:   Marland Kitchen Physically Abused:   . Sexually Abused:    PHYSICAL EXAM  Vitals:   01/19/20 1105  BP: (!) 160/78  Pulse: 68  Weight: 174 lb (78.9 kg)  Height: 5\' 3"  (1.6 m)   Body mass index is 30.82 kg/m.  Generalized: Well developed, in no acute distress   Neurological examination  Mentation: Alert oriented to time, place, history taking. Follows all commands speech and language fluent Cranial nerve II-XII: Pupils were equal round reactive to light. Extraocular movements were full, visual field were full on confrontational test. Facial sensation and strength were normal.  Head turning and shoulder shrug were normal and symmetric. Motor: The motor testing reveals 5 over 5 strength of all 4 extremities. Good symmetric motor tone is noted throughout. Wearing braces to bilateral LEs Sensory: Sensory testing is intact to soft touch on all 4  extremities. No evidence of extinction is noted.  Coordination: Cerebellar testing reveals good finger-nose-finger and heel-to-shin bilaterally.  Gait and station: Gait is slightly wide-based, can walk independently, but has a rolling walker Reflexes: Deep tendon reflexes are symmetric but are depressed throughout  DIAGNOSTIC DATA (LABS, IMAGING, TESTING) - I reviewed patient records, labs, notes, testing and imaging myself where available.  Lab Results  Component Value Date   WBC 9.6 12/22/2019   HGB 11.9 12/22/2019   HCT 36.4 12/22/2019   MCV 90.1 12/22/2019   PLT 417 (H) 12/22/2019      Component Value Date/Time   NA 138  12/22/2019 1638   K 5.4 (H) 12/22/2019 1638   CL 100 12/22/2019 1638   CO2 30 12/22/2019 1638   GLUCOSE 134 (H) 12/22/2019 1638   BUN 25 12/22/2019 1638   CREATININE 1.01 (H) 12/22/2019 1638   CALCIUM 10.0 12/22/2019 1638   PROT 6.2 12/22/2019 1638   ALBUMIN 3.8 01/27/2017 1609   AST 15 12/22/2019 1638   ALT 19 12/22/2019 1638   ALKPHOS 69 01/27/2017 1609   BILITOT 0.3 12/22/2019 1638   GFRNONAA 57 (L) 12/22/2019 1638   GFRAA 66 12/22/2019 1638   Lab Results  Component Value Date   CHOL 219 (H) 11/10/2019   HDL 57 11/10/2019   LDLCALC 128 (H) 11/10/2019   TRIG 206 (H) 11/10/2019   CHOLHDL 3.8 11/10/2019   Lab Results  Component Value Date   HGBA1C 7.7 (H) 11/10/2019   No results found for: VITAMINB12 Lab Results  Component Value Date   TSH 2.09 11/10/2019   ASSESSMENT AND PLAN 69 y.o. year old female  has a past medical history of Abnormality of gait (07/28/2013), Diabetes ([place]), Diabetic peripheral neuropathy ([place]), Diabetic retinopathy ([place]), Foot drop, bilateral (07/31/2014), Peripheral edema, and Polyneuropathy in diabetes(357.2) (07/28/2013). here with:  1.  Diabetic peripheral neuropathy  Her neuropathy remains overall stable.  She will remain on Cymbalta.  A refill was sent.  She will follow-up in 1 year or sooner if needed.  I told  her the sleep study should be read the next week.  I spent 20 minutes of face-to-face and non-face-to-face time with patient.  This included previsit chart review, lab review, study review, order entry, electronic health record documentation, patient education.  Butler Denmark, AGNP-C, DNP 01/19/2020, 11:38 AM [facility] [address], [place], [place] [address] [phone]

## 2020-01-19 ENCOUNTER — Encounter: Payer: Self-pay | Admitting: Neurology

## 2020-01-19 ENCOUNTER — Other Ambulatory Visit: Payer: Self-pay

## 2020-01-19 ENCOUNTER — Ambulatory Visit (INDEPENDENT_AMBULATORY_CARE_PROVIDER_SITE_OTHER): Payer: Medicare Other | Admitting: Neurology

## 2020-01-19 VITALS — BP 160/78 | HR 68 | Ht 63.0 in | Wt 174.0 lb

## 2020-01-19 DIAGNOSIS — E0842 Diabetes mellitus due to underlying condition with diabetic polyneuropathy: Secondary | ICD-10-CM | POA: Diagnosis not present

## 2020-01-19 MED ORDER — DULOXETINE HCL 30 MG PO CPEP: 30.0000 mg | ORAL_CAPSULE | Freq: Every day | ORAL | 3 refills | Status: DC

## 2020-01-19 NOTE — Patient Instructions (Addendum)
Continue the Cymbalta  See you back in 1 year  Sleep study should be read by next week

## 2020-01-19 NOTE — Progress Notes (Signed)
I have read the note, and I agree with the clinical assessment and plan.  Jullisa Grigoryan K Donley [place]   

## 2020-01-23 ENCOUNTER — Encounter (INDEPENDENT_AMBULATORY_CARE_PROVIDER_SITE_OTHER): Payer: Medicare Other | Admitting: Ophthalmology

## 2020-01-24 ENCOUNTER — Ambulatory Visit (INDEPENDENT_AMBULATORY_CARE_PROVIDER_SITE_OTHER): Payer: Medicare Other | Admitting: Ophthalmology

## 2020-01-24 ENCOUNTER — Encounter (INDEPENDENT_AMBULATORY_CARE_PROVIDER_SITE_OTHER): Payer: Self-pay | Admitting: Ophthalmology

## 2020-01-24 ENCOUNTER — Other Ambulatory Visit: Payer: Self-pay

## 2020-01-24 DIAGNOSIS — E113512 Type 2 diabetes mellitus with proliferative diabetic retinopathy with macular edema, left eye: Secondary | ICD-10-CM | POA: Diagnosis not present

## 2020-01-24 DIAGNOSIS — E113511 Type 2 diabetes mellitus with proliferative diabetic retinopathy with macular edema, right eye: Secondary | ICD-10-CM | POA: Diagnosis not present

## 2020-01-24 MED ORDER — AFLIBERCEPT 2MG/0.05ML IZ SOLN FOR KALEIDOSCOPE
2.0000 mg | INTRAVITREAL | Status: AC | PRN
  Administered 2020-01-24: 2 mg via INTRAVITREAL

## 2020-01-24 NOTE — Assessment & Plan Note (Signed)
Will need repeat injection Eylea OS soon

## 2020-01-24 NOTE — Assessment & Plan Note (Signed)
At 7-week interval, CSME does recur after intravitreal Eylea.  OD today and examination in 6 weeks and likely injection repeated then

## 2020-01-24 NOTE — Progress Notes (Signed)
01/24/2020     CHIEF COMPLAINT Patient presents for Retina Follow Up   HISTORY OF PRESENT ILLNESS: Grace [name] is a 69 y.o. female who presents to the clinic today for:   HPI    Retina Follow Up    Patient presents with  Diabetic Retinopathy.  In right eye.  Duration of 7 weeks.  Since onset it is gradually worsening.          Comments    7 week follow up - OCT OU, Poss Eylea OD Patient states she has had a decrease in vision since last week. No other complaints. LBS 122 this AM A1C 7.21 October 2019       Last edited by Gerda Diss on 01/24/2020  3:25 PM. (History)      Referring physician: Unk Pinto, MD [address] [place],  [place] [address]  HISTORICAL INFORMATION:   Selected notes from the MEDICAL RECORD NUMBER    Lab Results  Component Value Date   HGBA1C 7.7 (H) 11/10/2019     CURRENT MEDICATIONS: No current outpatient medications on file. (Ophthalmic Drugs)   No current facility-administered medications for this visit. (Ophthalmic Drugs)   Current Outpatient Medications (Other)  Medication Sig  . Ascorbic Acid (VITAMIN C PO) Take 1 tablet by mouth daily.  . Calcium Citrate-Vitamin D (CITRACAL/VITAMIN D PO) Take 1 tablet by mouth daily.  . Cholecalciferol (VITAMIN D PO) Take 1,000 Units by mouth 2 (two) times daily. Reported on 02/07/2016  . DULoxetine (CYMBALTA) 30 MG capsule Take 1 capsule (30 mg total) by mouth daily.  Marland Kitchen glimepiride (AMARYL) 2 MG tablet Take 1 tablet 1 or 2 x /day with Meals for Diabetes  . glucose blood (ACCU-CHEK GUIDE) test strip Check blood sugar 3 times a day-DX-E11.69 AND E78.5.  . Magnesium 250 MG TABS Take 2 tablets by mouth daily.   . metFORMIN (GLUCOPHAGE-XR) 500 MG 24 hr tablet Take 2 tablets 2 x /day with meals for Diabetes  . zinc gluconate 50 MG tablet Take 50 mg by mouth daily.   No current facility-administered medications for this visit. (Other)      REVIEW OF  SYSTEMS:    ALLERGIES Allergies  Allergen Reactions  . Gabapentin Swelling    Swelling in legs and feet    PAST MEDICAL HISTORY Past Medical History:  Diagnosis Date  . Abnormality of gait 07/28/2013  . Diabetes ([place])   . Diabetic peripheral neuropathy ([place])   . Diabetic retinopathy ([place])   . Foot drop, bilateral 07/31/2014  . Peripheral edema   . Polyneuropathy in diabetes(357.2) 07/28/2013   Past Surgical History:  Procedure Laterality Date  . ABDOMINAL HYSTERECTOMY    . ANKLE FRACTURE SURGERY     left  . CATARACT EXTRACTION W/PHACO Right 02/21/2014   Dr. Herbert Deaner  . CATARACT EXTRACTION W/PHACO Left 04/10/2014   Dr. Herbert Deaner  . CATARACT EXTRACTION, BILATERAL    . HIP SURGERY     right  . TONSILLECTOMY      FAMILY HISTORY Family History  Problem Relation Age of Onset  . Diabetes Mother   . Diabetes Brother   . Neuropathy Neg Hx     SOCIAL HISTORY Social History   Tobacco Use  . Smoking status: Never Smoker  . Smokeless tobacco: Never Used  Substance Use Topics  . Alcohol use: No  . Drug use: No         OPHTHALMIC EXAM:  Base Eye Exam    Visual Acuity (  Snellen - Linear)      Right Left   Dist [place] 20/40-2 20/60+2   Dist ph  NI NI       Tonometry (Tonopen, 3:33 PM)      Right Left   Pressure 16 14       Pupils      Pupils Dark Light Shape React APD   Right PERRL 3 3 Round Minimal None   Left PERRL 3 3 Round Minimal None       Visual Fields (Counting fingers)      Left Right    Full Full       Extraocular Movement      Right Left    Full Full       Neuro/Psych    Oriented x3: Yes   Mood/Affect: Normal       Dilation    Right eye: 1.0% Mydriacyl, 2.5% Phenylephrine @ 3:33 PM        Slit Lamp and Fundus Exam    External Exam      Right Left   External Normal Normal       Slit Lamp Exam      Right Left   Lids/Lashes Normal Normal   Conjunctiva/Sclera White and quiet White and quiet   Cornea Clear Clear   Anterior  Chamber Deep and quiet Deep and quiet   Iris Round and reactive Round and reactive   Lens Posterior chamber intraocular lens Posterior chamber intraocular lens   Anterior Vitreous Normal Normal       Fundus Exam      Right Left   C/D Ratio 0.25           IMAGING AND PROCEDURES  Imaging and Procedures for 01/24/20  OCT, Retina - OU - Both Eyes       Right Eye Quality was good. Scan locations included subfoveal. Central Foveal Thickness: 367. Progression has worsened. Findings include cystoid macular edema.   Left Eye Quality was good. Scan locations included subfoveal. Central Foveal Thickness: 387. Progression has been stable.   Notes OD, recurrent CSME does occur 7-week interval.  We will repeat intravitreal Eylea OD today and examination OD in 6 weeks  OS, recurrent CSME is also present around the 7-week interval and will need to repeat intravitreal Eylea soon and probably stay on 6-week interval       Intravitreal Injection, Pharmacologic Agent - OD - Right Eye       Time Out 01/24/2020. 4:46 PM. Confirmed correct patient, procedure, site, and patient consented.   Anesthesia No anesthesia was used. Anesthetic medications included Akten 3.5%.   Procedure Preparation included Ofloxacin , 10% betadine to eyelids. A 30 gauge needle was used.   Injection:  2 mg aflibercept [name]) SOLN   NDC: [id number], Lot: [id number]   Route: Intravitreal, Site: Right Eye, Waste: 0 mg  Post-op Post injection exam found visual acuity of at least counting fingers. The patient tolerated the procedure well. There were no complications. The patient received written and verbal post procedure care education. Post injection medications were not given.                 ASSESSMENT/PLAN:  Diabetic macular edema of right eye with proliferative retinopathy associated with type 2 diabetes mellitus (HCC) At 7-week interval, CSME does recur after intravitreal Eylea.  OD today and  examination in 6 weeks and likely injection repeated then  Proliferative diabetic retinopathy of left eye with macular edema associated with type 2  diabetes mellitus ([place]) Will need repeat injection Eylea OS soon      ICD-10-CM   1. Diabetic macular edema of right eye with proliferative retinopathy associated with type 2 diabetes mellitus (HCC)  E11.3511 OCT, Retina - OU - Both Eyes    Intravitreal Injection, Pharmacologic Agent - OD - Right Eye    aflibercept (EYLEA) SOLN 2 mg  2. Proliferative diabetic retinopathy of left eye with macular edema associated with type 2 diabetes mellitus ([place])  T70.0174     1.  2.  3.  Ophthalmic Meds Ordered this visit:  Meds ordered this encounter  Medications  . aflibercept (EYLEA) SOLN 2 mg       Return in about 6 weeks (around 03/06/2020) for EYLEA OCT, OD.  There are no Patient Instructions on file for this visit.   Explained the diagnoses, plan, and follow up with the patient and they expressed understanding.  Patient expressed understanding of the importance of proper follow up care.   Clent Demark Bibi Economos M.D. Diseases & Surgery of the Retina and Vitreous Retina & Diabetic [place] 01/24/20     Abbreviations: M myopia (nearsighted); A astigmatism; H hyperopia (farsighted); P presbyopia; Mrx spectacle prescription;  CTL contact lenses; OD right eye; OS left eye; OU both eyes  XT exotropia; ET esotropia; PEK punctate epithelial keratitis; PEE punctate epithelial erosions; DES dry eye syndrome; MGD meibomian gland dysfunction; ATs artificial tears; PFAT's preservative free artificial tears; [place] nuclear sclerotic cataract; PSC posterior subcapsular cataract; ERM epi-retinal membrane; PVD posterior vitreous detachment; RD retinal detachment; DM diabetes mellitus; DR diabetic retinopathy; NPDR non-proliferative diabetic retinopathy; PDR proliferative diabetic retinopathy; CSME clinically significant macular edema; DME diabetic macular edema;  dbh dot blot hemorrhages; CWS cotton wool spot; POAG primary open angle glaucoma; C/D cup-to-disc ratio; HVF humphrey visual field; GVF goldmann visual field; OCT optical coherence tomography; IOP intraocular pressure; BRVO Branch retinal vein occlusion; CRVO central retinal vein occlusion; CRAO central retinal artery occlusion; BRAO branch retinal artery occlusion; RT retinal tear; SB scleral buckle; PPV pars plana vitrectomy; VH Vitreous hemorrhage; PRP panretinal laser photocoagulation; IVK intravitreal kenalog; VMT vitreomacular traction; MH Macular hole;  NVD neovascularization of the disc; NVE neovascularization elsewhere; AREDS age related eye disease study; ARMD age related macular degeneration; POAG primary open angle glaucoma; EBMD epithelial/anterior basement membrane dystrophy; ACIOL anterior chamber intraocular lens; IOL intraocular lens; PCIOL posterior chamber intraocular lens; Phaco/IOL phacoemulsification with intraocular lens placement; [place] photorefractive keratectomy; LASIK laser assisted in situ keratomileusis; HTN hypertension; DM diabetes mellitus; COPD chronic obstructive pulmonary disease

## 2020-01-26 ENCOUNTER — Ambulatory Visit (INDEPENDENT_AMBULATORY_CARE_PROVIDER_SITE_OTHER): Payer: Medicare Other | Admitting: Ophthalmology

## 2020-01-26 ENCOUNTER — Encounter (INDEPENDENT_AMBULATORY_CARE_PROVIDER_SITE_OTHER): Payer: Self-pay | Admitting: Ophthalmology

## 2020-01-26 ENCOUNTER — Other Ambulatory Visit: Payer: Self-pay

## 2020-01-26 DIAGNOSIS — E113512 Type 2 diabetes mellitus with proliferative diabetic retinopathy with macular edema, left eye: Secondary | ICD-10-CM | POA: Diagnosis not present

## 2020-01-26 DIAGNOSIS — E113511 Type 2 diabetes mellitus with proliferative diabetic retinopathy with macular edema, right eye: Secondary | ICD-10-CM

## 2020-01-26 MED ORDER — AFLIBERCEPT 2MG/0.05ML IZ SOLN FOR KALEIDOSCOPE
2.0000 mg | INTRAVITREAL | Status: AC | PRN
  Administered 2020-01-26: 2 mg via INTRAVITREAL

## 2020-01-26 NOTE — Assessment & Plan Note (Signed)
CSME OS is stable at 8-week interval and we will repeat injection Eylea today and maintain 8-week exam interval

## 2020-01-26 NOTE — Progress Notes (Signed)
01/26/2020     CHIEF COMPLAINT Patient presents for Retina Follow Up   HISTORY OF PRESENT ILLNESS: Grace [name] is a 69 y.o. female who presents to the clinic today for:   HPI    Retina Follow Up    Patient presents with  Diabetic Retinopathy.  In left eye.  Duration of 8 weeks.  Since onset it is stable.          Comments    8 week follow up - OCT OU, Poss Eylea OS Patient denies change in vision and overall has no complaints. LBS 142 this AM A1C 7.5        Last edited by Gerda Diss on 01/26/2020  4:17 PM. (History)      Referring physician: Unk Pinto, MD [address] [place],  [place] [address]  HISTORICAL INFORMATION:   Selected notes from the MEDICAL RECORD NUMBER    Lab Results  Component Value Date   HGBA1C 7.7 (H) 11/10/2019     CURRENT MEDICATIONS: No current outpatient medications on file. (Ophthalmic Drugs)   No current facility-administered medications for this visit. (Ophthalmic Drugs)   Current Outpatient Medications (Other)  Medication Sig  . Ascorbic Acid (VITAMIN C PO) Take 1 tablet by mouth daily.  . Calcium Citrate-Vitamin D (CITRACAL/VITAMIN D PO) Take 1 tablet by mouth daily.  . Cholecalciferol (VITAMIN D PO) Take 1,000 Units by mouth 2 (two) times daily. Reported on 02/07/2016  . DULoxetine (CYMBALTA) 30 MG capsule Take 1 capsule (30 mg total) by mouth daily.  Marland Kitchen glimepiride (AMARYL) 2 MG tablet Take 1 tablet 1 or 2 x /day with Meals for Diabetes  . glucose blood (ACCU-CHEK GUIDE) test strip Check blood sugar 3 times a day-DX-E11.69 AND E78.5.  . Magnesium 250 MG TABS Take 2 tablets by mouth daily.   . metFORMIN (GLUCOPHAGE-XR) 500 MG 24 hr tablet Take 2 tablets 2 x /day with meals for Diabetes  . zinc gluconate 50 MG tablet Take 50 mg by mouth daily.   No current facility-administered medications for this visit. (Other)      REVIEW OF SYSTEMS:    ALLERGIES Allergies  Allergen Reactions  .  Gabapentin Swelling    Swelling in legs and feet    PAST MEDICAL HISTORY Past Medical History:  Diagnosis Date  . Abnormality of gait 07/28/2013  . Diabetes ([place])   . Diabetic peripheral neuropathy ([place])   . Diabetic retinopathy ([place])   . Foot drop, bilateral 07/31/2014  . Peripheral edema   . Polyneuropathy in diabetes(357.2) 07/28/2013   Past Surgical History:  Procedure Laterality Date  . ABDOMINAL HYSTERECTOMY    . ANKLE FRACTURE SURGERY     left  . CATARACT EXTRACTION W/PHACO Right 02/21/2014   Dr. Herbert Deaner  . CATARACT EXTRACTION W/PHACO Left 04/10/2014   Dr. Herbert Deaner  . CATARACT EXTRACTION, BILATERAL    . HIP SURGERY     right  . TONSILLECTOMY      FAMILY HISTORY Family History  Problem Relation Age of Onset  . Diabetes Mother   . Diabetes Brother   . Neuropathy Neg Hx     SOCIAL HISTORY Social History   Tobacco Use  . Smoking status: Never Smoker  . Smokeless tobacco: Never Used  Substance Use Topics  . Alcohol use: No  . Drug use: No         OPHTHALMIC EXAM:  Base Eye Exam    Visual Acuity (Snellen - Linear)  Right Left   Dist [place] 20/40 20/60+2   Dist ph [place] NI NI       Tonometry (Tonopen, 4:23 PM)      Right Left   Pressure 17 15       Pupils      Pupils Dark Light Shape React APD   Right PERRL 3 3 Round Minimal None   Left PERRL 3 3 Round Minimal None       Visual Fields (Counting fingers)      Left Right    Full Full       Extraocular Movement      Right Left    Full Full       Neuro/Psych    Oriented x3: Yes   Mood/Affect: Normal       Dilation    Left eye: 1.0% Mydriacyl, 2.5% Phenylephrine @ 4:23 PM        Slit Lamp and Fundus Exam    External Exam      Right Left   External Normal Normal       Slit Lamp Exam      Right Left   Lids/Lashes Normal Normal   Conjunctiva/Sclera White and quiet White and quiet   Cornea Clear Clear   Anterior Chamber Deep and quiet Deep and quiet   Iris Round and reactive  Round and reactive   Lens Posterior chamber intraocular lens Posterior chamber intraocular lens   Anterior Vitreous Normal Normal       Fundus Exam      Right Left   Posterior Vitreous  Posterior vitreous detachment   Disc  Normal   C/D Ratio  0.2-0.3   Macula  Microaneurysms, Macular thickening, Clinically significant macular edema, Mild clinically significant macular edema   Vessels  Severe nonproliferative diabetic retinopathy with retinal nonperfusion   Periphery  Good peripheral PRP          IMAGING AND PROCEDURES  Imaging and Procedures for 01/26/20  OCT, Retina - OU - Both Eyes       Right Eye Quality was good. Scan locations included subfoveal. Central Foveal Thickness: 301. Progression has improved.   Left Eye Quality was good. Scan locations included subfoveal. Central Foveal Thickness: 384. Progression has been stable.   Notes OD, vastly improved 2 days status post Eylea for CSME       Intravitreal Injection, Pharmacologic Agent - OS - Left Eye       Time Out 01/26/2020. 5:19 PM. Confirmed correct patient, procedure, site, and patient consented.   Anesthesia Topical anesthesia was used. Anesthetic medications included Akten 3.5%.   Procedure Preparation included Tobramycin 0.3%, 10% betadine to eyelids. A 30 gauge needle was used.   Injection:  2 mg aflibercept [name]) SOLN   NDC: [id number], Lot: [id number]   Route: Intravitreal, Site: Left Eye, Waste: 0 mg  Post-op Post injection exam found visual acuity of at least counting fingers. The patient tolerated the procedure well. There were no complications. The patient received written and verbal post procedure care education. Post injection medications were not given.                 ASSESSMENT/PLAN:  Diabetic macular edema of right eye with proliferative retinopathy associated with type 2 diabetes mellitus (HCC) Improved at 2 days status post Eylea, will continue at the shorter interval  duration of 6 weeks in the future OD  Proliferative diabetic retinopathy of left eye with macular edema associated with type 2 diabetes mellitus ([place])  CSME OS is stable at 8-week interval and we will repeat injection Eylea today and maintain 8-week exam interval      ICD-10-CM   1. Proliferative diabetic retinopathy of left eye with macular edema associated with type 2 diabetes mellitus (HCC)  K93.2671 OCT, Retina - OU - Both Eyes    Intravitreal Injection, Pharmacologic Agent - OS - Left Eye    aflibercept (EYLEA) SOLN 2 mg  2. Diabetic macular edema of right eye with proliferative retinopathy associated with type 2 diabetes mellitus ([place])  E11.3511     1.  2.  3.  Ophthalmic Meds Ordered this visit:  Meds ordered this encounter  Medications  . aflibercept (EYLEA) SOLN 2 mg       Return in about 8 weeks (around 03/22/2020) for dilate, OS, EYLEA OCT.  There are no Patient Instructions on file for this visit.   Explained the diagnoses, plan, and follow up with the patient and they expressed understanding.  Patient expressed understanding of the importance of proper follow up care.   Clent Demark Jaiden Dinkins M.D. Diseases & Surgery of the Retina and Vitreous Retina & Diabetic [place] 01/26/20     Abbreviations: M myopia (nearsighted); A astigmatism; H hyperopia (farsighted); P presbyopia; Mrx spectacle prescription;  CTL contact lenses; OD right eye; OS left eye; OU both eyes  XT exotropia; ET esotropia; PEK punctate epithelial keratitis; PEE punctate epithelial erosions; DES dry eye syndrome; MGD meibomian gland dysfunction; ATs artificial tears; PFAT's preservative free artificial tears; [place] nuclear sclerotic cataract; PSC posterior subcapsular cataract; ERM epi-retinal membrane; PVD posterior vitreous detachment; RD retinal detachment; DM diabetes mellitus; DR diabetic retinopathy; NPDR non-proliferative diabetic retinopathy; PDR proliferative diabetic retinopathy; CSME clinically  significant macular edema; DME diabetic macular edema; dbh dot blot hemorrhages; CWS cotton wool spot; POAG primary open angle glaucoma; C/D cup-to-disc ratio; HVF humphrey visual field; GVF goldmann visual field; OCT optical coherence tomography; IOP intraocular pressure; BRVO Branch retinal vein occlusion; CRVO central retinal vein occlusion; CRAO central retinal artery occlusion; BRAO branch retinal artery occlusion; RT retinal tear; SB scleral buckle; PPV pars plana vitrectomy; VH Vitreous hemorrhage; PRP panretinal laser photocoagulation; IVK intravitreal kenalog; VMT vitreomacular traction; MH Macular hole;  NVD neovascularization of the disc; NVE neovascularization elsewhere; AREDS age related eye disease study; ARMD age related macular degeneration; POAG primary open angle glaucoma; EBMD epithelial/anterior basement membrane dystrophy; ACIOL anterior chamber intraocular lens; IOL intraocular lens; PCIOL posterior chamber intraocular lens; Phaco/IOL phacoemulsification with intraocular lens placement; [place] photorefractive keratectomy; LASIK laser assisted in situ keratomileusis; HTN hypertension; DM diabetes mellitus; COPD chronic obstructive pulmonary disease

## 2020-01-26 NOTE — Assessment & Plan Note (Signed)
Improved at 2 days status post Eylea, will continue at the shorter interval duration of 6 weeks in the future OD

## 2020-01-27 NOTE — Progress Notes (Signed)
Dr [name] is officially referring MD -  Summary & Diagnosis:   Mild sleep apnea at AHI of 8.9/h and REM AHI of 12.8/h of sleep  was recorded. Loud snoring was indicated by RDI and vibraphone.   No clinically significant associated oxygen desaturations and no  supine sleep were recorded. REM sleep was seen after 1 hour of  sleep latency and comprised overall a smaller than normal  proportion of the sleep architecture (7%).   Recommendations:    Even this mild sleep apnea should be treated with CPAP, given the  associated snoring and the EDS complaint.  I will order an auto titrating CPAP device with humidification  and pressure settings from 6-15 cm water, 3 cm EPR and mask of  the patient's choice- "side sleeper friendly".

## 2020-01-27 NOTE — Procedures (Addendum)
  Patient Information     First Name: Lexie Last Name: Goris ID: [id number]  Birth Date: 1951-04-29 Age: 69 Gender: Female  Referring Provider: Deloria Lair, MD  BMI: 30.5 (W=172 lb, H=5' 3'')  CC : Dr. Floyde Parkins.   [place]worth:  15/24   Sleep Study Information    Study Date: Jan 09, 2020 S/H/A Version: 001.001.001.001 / 4.1.1528 / 77  History:    Grace [name] was seen on 12-08-2019, upon referral by Dr. Deloria Lair, MD, as the retinal specialist Ophthalmology. She a right -handed Caucasian female with a medical history of Diabetic peripheral neuropathy ([place]), Diabetic retinopathy ([place]), diabetic neuropathy, vasculopathy, Foot drop, bilateral (07/31/2014), Peripheral edema, and is followed since 2014 by Dr. Jannifer Franklin.  She has had cataract surgery followed by a diagnosis of her diabetic retinopathy over 4 years ago, has worsened. Dr [name] is interested in ruling out OSA as a contributing cause for vascular edema. The patient reports excessive daytime sleepiness and snores.       Summary & Diagnosis:    Mild sleep apnea at AHI of 8.9/h and REM AHI of 12.8/h of sleep was recorded. Loud snoring was indicated by RDI and vibraphone.  No clinically significant associated oxygen desaturations and no supine sleep were recorded. REM sleep was seen after 1 hour of sleep latency and comprised overall a smaller than normal proportion of the sleep architecture (7%).   Recommendations:     Even this mild sleep apnea should be treated with CPAP, given the associated snoring and the EDS complaint. I will order an auto titrating CPAP device with humidification and pressure settings form 6-15 cm water, 3 cm EPR and mask of the patient's choice- side sleeper friendly.   Interpreting Physician: Larey Seat, MD            Sleep Summary  Oxygen Saturation Statistics   Start Study Time: End Study Time: Total Recording Time:       11:28:25 PM        9:04:48 AM 9 h, 36 min  Total estimated Sleep  Time % REM of Sleep Time:  8 h, 30 min  7.7    Mean: 94 Minimum: 89 Maximum: 98  Mean of Desaturations [place] (%):   91  Oxygen Desaturation. %: 4-9 10-20 >20 Total  Events Number Total  18 100.0  0 0.0  0 0.0  18 100.0  Oxygen Saturation: <90 <=88 <85 <80 <70  Duration (minutes): Sleep % 0.2 0.0 0.0 0.0 0.0 0.0 0.0 0.0 0.0 0.0     Respiratory Indices      Total Events REM NREM All Night  pRDI:  52  pAHI:  52 ODI:  18  pAHIc:  5  % CSR: 0.0 12.8 12.8 5.1 0.0 8.6 8.6 2.9 0.9 8.9 8.9 3.1 0.9       Pulse Rate Statistics during Sleep (BPM)      Mean:  68 Minimum: 58  Maximum: 81    Indices are calculated using technically valid sleep time of 5 h, 51 min. pRDI/pAHI are calculated using 02i desaturations ? 3%

## 2020-01-27 NOTE — Addendum Note (Signed)
Addended by: Larey Seat on: 01/27/2020 03:50 PM   Modules accepted: Orders

## 2020-02-01 ENCOUNTER — Telehealth: Payer: Self-pay | Admitting: Neurology

## 2020-02-01 NOTE — Telephone Encounter (Signed)
-----   Message from Larey Seat, MD sent at 01/27/2020  3:50 PM EDT ----- Dr [name] is officially referring MD -  Summary & Diagnosis:   Mild sleep apnea at AHI of 8.9/h and REM AHI of 12.8/h of sleep  was recorded. Loud snoring was indicated by RDI and vibraphone.   No clinically significant associated oxygen desaturations and no  supine sleep were recorded. REM sleep was seen after 1 hour of  sleep latency and comprised overall a smaller than normal  proportion of the sleep architecture (7%).   Recommendations:    Even this mild sleep apnea should be treated with CPAP, given the  associated snoring and the EDS complaint.  I will order an auto titrating CPAP device with humidification  and pressure settings from 6-15 cm water, 3 cm EPR and mask of  the patient's choice- "side sleeper friendly".

## 2020-02-01 NOTE — Telephone Encounter (Signed)
I called pt. I advised pt that Dr. Brett Fairy reviewed their sleep study results and found that pt has mild sleep apnea. Dr. Brett Fairy recommends that pt starts auto CPAP. I reviewed PAP compliance expectations with the pt. Pt is agreeable to starting a CPAP. I advised pt that an order will be sent to a DME, [facility], and [facility] will call the pt within about one week after they file with the pt's insurance. [facility] will show the pt how to use the machine, fit for masks, and troubleshoot the CPAP if needed. A follow up appt will need to be made for insurance purposes with Dr. Brett Fairy or Judson Roch, NP 31-90 days after starting her new machine. Pt verbalized understanding to arrive 15 minutes early and bring their CPAP. A letter with all of this information in it will be mailed to the pt as a reminder. I verified with the pt that the address we have on file is correct. Pt verbalized understanding of results. Pt had no questions at this time but was encouraged to call back if questions arise. I have sent the order to [facility] and have received confirmation that they have received the order.

## 2020-02-16 ENCOUNTER — Ambulatory Visit: Payer: Medicare Other | Admitting: Adult Health Nurse Practitioner

## 2020-02-21 ENCOUNTER — Ambulatory Visit
Admission: RE | Admit: 2020-02-21 | Discharge: 2020-02-21 | Disposition: A | Discharge status: Home / Self Care | Payer: Medicare Other | Source: Ambulatory Visit | Attending: Internal Medicine | Admitting: Internal Medicine

## 2020-02-21 ENCOUNTER — Other Ambulatory Visit: Payer: Self-pay

## 2020-02-21 ENCOUNTER — Other Ambulatory Visit: Payer: Self-pay | Admitting: Internal Medicine

## 2020-02-21 DIAGNOSIS — N6489 Other specified disorders of breast: Secondary | ICD-10-CM

## 2020-02-21 DIAGNOSIS — N6310 Unspecified lump in the right breast, unspecified quadrant: Secondary | ICD-10-CM

## 2020-02-21 DIAGNOSIS — N6320 Unspecified lump in the left breast, unspecified quadrant: Secondary | ICD-10-CM

## 2020-02-21 DIAGNOSIS — N6001 Solitary cyst of right breast: Secondary | ICD-10-CM

## 2020-02-26 ENCOUNTER — Other Ambulatory Visit: Payer: Self-pay | Admitting: Internal Medicine

## 2020-03-06 ENCOUNTER — Ambulatory Visit (INDEPENDENT_AMBULATORY_CARE_PROVIDER_SITE_OTHER): Payer: Medicare Other | Admitting: Ophthalmology

## 2020-03-06 ENCOUNTER — Other Ambulatory Visit: Payer: Self-pay

## 2020-03-06 ENCOUNTER — Encounter (INDEPENDENT_AMBULATORY_CARE_PROVIDER_SITE_OTHER): Payer: Self-pay | Admitting: Ophthalmology

## 2020-03-06 DIAGNOSIS — E113511 Type 2 diabetes mellitus with proliferative diabetic retinopathy with macular edema, right eye: Secondary | ICD-10-CM | POA: Diagnosis not present

## 2020-03-06 MED ORDER — AFLIBERCEPT 2MG/0.05ML IZ SOLN FOR KALEIDOSCOPE
2.0000 mg | INTRAVITREAL | Status: AC | PRN
  Administered 2020-03-06: 2 mg via INTRAVITREAL

## 2020-03-06 NOTE — Assessment & Plan Note (Addendum)
Excellent visual acuity recovery and improved CSME, currently at 6-week interval examination on intravitreal Eylea . And repeat today and exam in 6 weeks

## 2020-03-06 NOTE — Progress Notes (Signed)
03/06/2020     CHIEF COMPLAINT Patient presents for Retina Follow Up   HISTORY OF PRESENT ILLNESS: Grace [name] is a 69 y.o. female who presents to the clinic today for:   HPI    Retina Follow Up    Patient presents with  Diabetic Retinopathy.  In right eye.  Duration of 6 weeks.  Since onset it is stable.          Comments    6 week follow up- OCT OU, Poss Eylea OD Patient denies change in vision and overall has no complaints. LBS 126 /// A1C 7.5       Last edited by Gerda Diss on 03/06/2020  3:38 PM. (History)      Referring physician: Unk Pinto, MD [address] [place],  [place] [address]  HISTORICAL INFORMATION:   Selected notes from the MEDICAL RECORD NUMBER    Lab Results  Component Value Date   HGBA1C 7.7 (H) 11/10/2019     CURRENT MEDICATIONS: No current outpatient medications on file. (Ophthalmic Drugs)   No current facility-administered medications for this visit. (Ophthalmic Drugs)   Current Outpatient Medications (Other)  Medication Sig  . Ascorbic Acid (VITAMIN C PO) Take 1 tablet by mouth daily.  . Calcium Citrate-Vitamin D (CITRACAL/VITAMIN D PO) Take 1 tablet by mouth daily.  . Cholecalciferol (VITAMIN D PO) Take 1,000 Units by mouth 2 (two) times daily. Reported on 02/07/2016  . DULoxetine (CYMBALTA) 30 MG capsule Take 1 capsule (30 mg total) by mouth daily.  Marland Kitchen glimepiride (AMARYL) 2 MG tablet Take 1 tablet 1 or 2 x /day with Meals for Diabetes  . glucose blood (ACCU-CHEK GUIDE) test strip Check blood sugar 3 times a day-DX-E11.69 AND E78.5.  . Magnesium 250 MG TABS Take 2 tablets by mouth daily.   . metFORMIN (GLUCOPHAGE-XR) 500 MG 24 hr tablet Take 2 tablets    2 x   /day with Meals for Diabetes  . zinc gluconate 50 MG tablet Take 50 mg by mouth daily.   No current facility-administered medications for this visit. (Other)      REVIEW OF SYSTEMS:    ALLERGIES Allergies  Allergen Reactions  .  Gabapentin Swelling    Swelling in legs and feet    PAST MEDICAL HISTORY Past Medical History:  Diagnosis Date  . Abnormality of gait 07/28/2013  . Diabetes ([place])   . Diabetic peripheral neuropathy ([place])   . Diabetic retinopathy ([place])   . Foot drop, bilateral 07/31/2014  . Peripheral edema   . Polyneuropathy in diabetes(357.2) 07/28/2013   Past Surgical History:  Procedure Laterality Date  . ABDOMINAL HYSTERECTOMY    . ANKLE FRACTURE SURGERY     left  . CATARACT EXTRACTION W/PHACO Right 02/21/2014   Dr. Herbert Deaner  . CATARACT EXTRACTION W/PHACO Left 04/10/2014   Dr. Herbert Deaner  . CATARACT EXTRACTION, BILATERAL    . HIP SURGERY     right  . TONSILLECTOMY      FAMILY HISTORY Family History  Problem Relation Age of Onset  . Diabetes Mother   . Diabetes Brother   . Neuropathy Neg Hx     SOCIAL HISTORY Social History   Tobacco Use  . Smoking status: Never Smoker  . Smokeless tobacco: Never Used  Substance Use Topics  . Alcohol use: No  . Drug use: No         OPHTHALMIC EXAM:  Base Eye Exam    Visual Acuity (Snellen - Linear)  Right Left   Dist [place] 20/40+1 20/80+1   Dist ph [place] 20/30 20/70+1       Tonometry (Tonopen, 3:44 PM)      Right Left   Pressure 16 14       Pupils      Pupils Dark Light Shape React APD   Right PERRL 3 3 Round Minimal None   Left PERRL 3 3 Round Minimal None       Visual Fields (Counting fingers)      Left Right    Full Full       Extraocular Movement      Right Left    Full Full       Neuro/Psych    Oriented x3: Yes   Mood/Affect: Normal       Dilation    Right eye: 1.0% Mydriacyl, 2.5% Phenylephrine @ 3:44 PM        Slit Lamp and Fundus Exam    External Exam      Right Left   External Normal Normal       Slit Lamp Exam      Right Left   Lids/Lashes Normal Normal   Conjunctiva/Sclera White and quiet White and quiet   Cornea Clear Clear   Anterior Chamber Deep and quiet Deep and quiet   Iris Round and  reactive Round and reactive   Lens Posterior chamber intraocular lens Posterior chamber intraocular lens   Anterior Vitreous Normal Normal       Fundus Exam      Right Left   Posterior Vitreous Normal    Disc Normal    C/D Ratio 0.2    Macula Epiretinal membrane    Vessels PDR-quiet    Periphery good PRP,, ATTACHED           IMAGING AND PROCEDURES  Imaging and Procedures for 03/06/20  OCT, Retina - OU - Both Eyes       Right Eye Quality was good. Scan locations included subfoveal. Central Foveal Thickness: 301. Progression has improved. Findings include epiretinal membrane.   Left Eye Quality was good. Scan locations included subfoveal. Central Foveal Thickness: 384. Progression has worsened.   Notes OD, much less active currently at 6-week interval.. History of multiple chronic recurrence of knee on 6-week interval.  OS, CSME much worse today and also nearly 6-week interval. We will repeat exam and injection left eye within a week.       Intravitreal Injection, Pharmacologic Agent - OD - Right Eye       Time Out 03/06/2020. 3:57 PM. Confirmed correct patient, procedure, site, and patient consented.   Anesthesia Topical anesthesia was used. Anesthetic medications included Akten 3.5%.   Procedure Preparation included Ofloxacin , 10% betadine to eyelids, 5% betadine to ocular surface. A 30 gauge needle was used.   Injection:  2 mg aflibercept [name]) SOLN   NDC: [id number], Lot: [id number]   Route: Intravitreal, Site: Right Eye, Waste: 0 mg  Post-op Post injection exam found visual acuity of at least counting fingers. The patient tolerated the procedure well. There were no complications. The patient received written and verbal post procedure care education. Post injection medications were not given.                 ASSESSMENT/PLAN:  Diabetic macular edema of right eye with proliferative retinopathy associated with type 2 diabetes mellitus  (HCC) Excellent visual acuity recovery and improved CSME, currently at 6-week interval examination on intravitreal Eylea . And repeat  today and exam in 6 weeks      ICD-10-CM   1. Diabetic macular edema of right eye with proliferative retinopathy associated with type 2 diabetes mellitus (HCC)  E11.3511 OCT, Retina - OU - Both Eyes    Intravitreal Injection, Pharmacologic Agent - OD - Right Eye    aflibercept (EYLEA) SOLN 2 mg    1.  2.  3.  Ophthalmic Meds Ordered this visit:  Meds ordered this encounter  Medications  . aflibercept (EYLEA) SOLN 2 mg       Return in about 6 weeks (around 04/17/2020), or We will schedule repeat exam OS and likely injection Eylea 1 week, for dilate, OD, EYLEA OCT.  There are no Patient Instructions on file for this visit.   Explained the diagnoses, plan, and follow up with the patient and they expressed understanding.  Patient expressed understanding of the importance of proper follow up care.   Clent Demark Kamarrion Stfort M.D. Diseases & Surgery of the Retina and Vitreous Retina & Diabetic [place] 03/06/20     Abbreviations: M myopia (nearsighted); A astigmatism; H hyperopia (farsighted); P presbyopia; Mrx spectacle prescription;  CTL contact lenses; OD right eye; OS left eye; OU both eyes  XT exotropia; ET esotropia; PEK punctate epithelial keratitis; PEE punctate epithelial erosions; DES dry eye syndrome; MGD meibomian gland dysfunction; ATs artificial tears; PFAT's preservative free artificial tears; [place] nuclear sclerotic cataract; PSC posterior subcapsular cataract; ERM epi-retinal membrane; PVD posterior vitreous detachment; RD retinal detachment; DM diabetes mellitus; DR diabetic retinopathy; NPDR non-proliferative diabetic retinopathy; PDR proliferative diabetic retinopathy; CSME clinically significant macular edema; DME diabetic macular edema; dbh dot blot hemorrhages; CWS cotton wool spot; POAG primary open angle glaucoma; C/D cup-to-disc ratio;  HVF humphrey visual field; GVF goldmann visual field; OCT optical coherence tomography; IOP intraocular pressure; BRVO Branch retinal vein occlusion; CRVO central retinal vein occlusion; CRAO central retinal artery occlusion; BRAO branch retinal artery occlusion; RT retinal tear; SB scleral buckle; PPV pars plana vitrectomy; VH Vitreous hemorrhage; PRP panretinal laser photocoagulation; IVK intravitreal kenalog; VMT vitreomacular traction; MH Macular hole;  NVD neovascularization of the disc; NVE neovascularization elsewhere; AREDS age related eye disease study; ARMD age related macular degeneration; POAG primary open angle glaucoma; EBMD epithelial/anterior basement membrane dystrophy; ACIOL anterior chamber intraocular lens; IOL intraocular lens; PCIOL posterior chamber intraocular lens; Phaco/IOL phacoemulsification with intraocular lens placement; [place] photorefractive keratectomy; LASIK laser assisted in situ keratomileusis; HTN hypertension; DM diabetes mellitus; COPD chronic obstructive pulmonary disease

## 2020-03-13 ENCOUNTER — Other Ambulatory Visit: Payer: Self-pay

## 2020-03-13 ENCOUNTER — Encounter (INDEPENDENT_AMBULATORY_CARE_PROVIDER_SITE_OTHER): Payer: Medicare Other | Admitting: Ophthalmology

## 2020-03-13 ENCOUNTER — Ambulatory Visit (INDEPENDENT_AMBULATORY_CARE_PROVIDER_SITE_OTHER): Payer: Medicare Other | Admitting: Ophthalmology

## 2020-03-13 ENCOUNTER — Encounter (INDEPENDENT_AMBULATORY_CARE_PROVIDER_SITE_OTHER): Payer: Self-pay | Admitting: Ophthalmology

## 2020-03-13 DIAGNOSIS — E113512 Type 2 diabetes mellitus with proliferative diabetic retinopathy with macular edema, left eye: Secondary | ICD-10-CM

## 2020-03-13 MED ORDER — AFLIBERCEPT 2MG/0.05ML IZ SOLN FOR KALEIDOSCOPE
2.0000 mg | INTRAVITREAL | Status: AC | PRN
  Administered 2020-03-13: 2 mg via INTRAVITREAL

## 2020-03-13 NOTE — Assessment & Plan Note (Signed)
Chronic recurrent CME from CSME at current interval 8 weeks.  We will repeat intravitreal Eylea today  Patient does report picking up her CPAP materials today to commence use at night for some form of sleep apnea.

## 2020-03-13 NOTE — Progress Notes (Signed)
03/13/2020     CHIEF COMPLAINT Patient presents for Retina Follow Up   HISTORY OF PRESENT ILLNESS: Grace [name] is a 69 y.o. female who presents to the clinic today for:   HPI    Retina Follow Up    Patient presents with  Diabetic Retinopathy.  In left eye.  This started 7 weeks ago.  Severity is mild.  Duration of 7 weeks.  Since onset it is stable.          Comments    7 Week Diabetic F/U OS, poss Eylea OS  Pt sts VA OD has improved since last injection. VA OS stable. No new symptoms. LBS: 173 this AM       Last edited by Rockie Neighbours, [place] on 03/13/2020  8:31 AM. (History)      Referring physician: Unk Pinto, MD [address] [place],  [place] [address]  HISTORICAL INFORMATION:   Selected notes from the MEDICAL RECORD NUMBER    Lab Results  Component Value Date   HGBA1C 7.7 (H) 11/10/2019     CURRENT MEDICATIONS: No current outpatient medications on file. (Ophthalmic Drugs)   No current facility-administered medications for this visit. (Ophthalmic Drugs)   Current Outpatient Medications (Other)  Medication Sig  . Ascorbic Acid (VITAMIN C PO) Take 1 tablet by mouth daily.  . Calcium Citrate-Vitamin D (CITRACAL/VITAMIN D PO) Take 1 tablet by mouth daily.  . Cholecalciferol (VITAMIN D PO) Take 1,000 Units by mouth 2 (two) times daily. Reported on 02/07/2016  . DULoxetine (CYMBALTA) 30 MG capsule Take 1 capsule (30 mg total) by mouth daily.  Marland Kitchen glimepiride (AMARYL) 2 MG tablet Take 1 tablet 1 or 2 x /day with Meals for Diabetes  . glucose blood (ACCU-CHEK GUIDE) test strip Check blood sugar 3 times a day-DX-E11.69 AND E78.5.  . Magnesium 250 MG TABS Take 2 tablets by mouth daily.   . metFORMIN (GLUCOPHAGE-XR) 500 MG 24 hr tablet Take 2 tablets    2 x   /day with Meals for Diabetes  . zinc gluconate 50 MG tablet Take 50 mg by mouth daily.   No current facility-administered medications for this visit. (Other)      REVIEW OF  SYSTEMS:    ALLERGIES Allergies  Allergen Reactions  . Gabapentin Swelling    Swelling in legs and feet    PAST MEDICAL HISTORY Past Medical History:  Diagnosis Date  . Abnormality of gait 07/28/2013  . Diabetes ([place])   . Diabetic peripheral neuropathy ([place])   . Diabetic retinopathy ([place])   . Foot drop, bilateral 07/31/2014  . Peripheral edema   . Polyneuropathy in diabetes(357.2) 07/28/2013   Past Surgical History:  Procedure Laterality Date  . ABDOMINAL HYSTERECTOMY    . ANKLE FRACTURE SURGERY     left  . CATARACT EXTRACTION W/PHACO Right 02/21/2014   Dr. Herbert Deaner  . CATARACT EXTRACTION W/PHACO Left 04/10/2014   Dr. Herbert Deaner  . CATARACT EXTRACTION, BILATERAL    . HIP SURGERY     right  . TONSILLECTOMY      FAMILY HISTORY Family History  Problem Relation Age of Onset  . Diabetes Mother   . Diabetes Brother   . Neuropathy Neg Hx     SOCIAL HISTORY Social History   Tobacco Use  . Smoking status: Never Smoker  . Smokeless tobacco: Never Used  Substance Use Topics  . Alcohol use: No  . Drug use: No         OPHTHALMIC  EXAM:  Base Eye Exam    Visual Acuity (ETDRS)      Right Left   Dist [place] 20/30 +1 20/80 +2   Dist ph [place] 20/30 +2 20/70 +2       Tonometry (Tonopen, 8:36 AM)      Right Left   Pressure 13 13       Pupils      Pupils Dark Light Shape React APD   Right PERRL 3 3 Round Minimal None   Left PERRL 3 3 Round Minimal None       Visual Fields (Counting fingers)      Left Right    Full Full       Extraocular Movement      Right Left    Full Full       Neuro/Psych    Oriented x3: Yes   Mood/Affect: Normal       Dilation    Left eye: 1.0% Mydriacyl, 2.5% Phenylephrine @ 8:36 AM        Slit Lamp and Fundus Exam    External Exam      Right Left   External Normal Normal       Slit Lamp Exam      Right Left   Lids/Lashes Normal Normal   Conjunctiva/Sclera White and quiet White and quiet   Cornea Clear, retroillumination  examination there are fine particulate midsternal posterior stromal almost clear crystalline deposits and even lattice type linear deposits, not tear of the epithelium Clear, retroillumination examination there are fine particulate midsternal posterior stromal almost clear crystalline deposits and even lattice type linear deposits, not tear of the epithelium   Anterior Chamber Deep and quiet Deep and quiet   Iris Round and reactive Round and reactive   Lens Posterior chamber intraocular lens Posterior chamber intraocular lens   Anterior Vitreous Normal Normal       Fundus Exam      Right Left   Posterior Vitreous  Posterior vitreous detachment   Disc  Normal   C/D Ratio  0.2-0.3   Macula  Microaneurysms, Macular thickening, Clinically significant macular edema, Mild clinically significant macular edema   Vessels  Severe nonproliferative diabetic retinopathy with retinal nonperfusion   Periphery  Good peripheral PRP          IMAGING AND PROCEDURES  Imaging and Procedures for 03/13/20  OCT, Retina - OU - Both Eyes       Right Eye Quality was good. Scan locations included subfoveal. Central Foveal Thickness: 278. Progression has improved. Findings include epiretinal membrane.   Left Eye Quality was good. Scan locations included subfoveal. Central Foveal Thickness: 407. Progression has worsened. Findings include cystoid macular edema.   Notes Chronic recurrent CME from CSME at current interval 8 weeks.  We will repeat intravitreal Eylea today         Intravitreal Injection, Pharmacologic Agent - OS - Left Eye       Time Out 03/13/2020. 9:36 AM. Confirmed correct patient, procedure, site, and patient consented.   Anesthesia Topical anesthesia was used. Anesthetic medications included Akten 3.5%.   Procedure Preparation included Tobramycin 0.3%, 5% betadine to ocular surface, 10% betadine to eyelids. A 30 gauge needle was used.   Injection:  2 mg aflibercept [name])  SOLN   NDC: [id number], Lot: [id number]   Route: Intravitreal, Site: Left Eye, Waste: 0 mg  Post-op Post injection exam found visual acuity of at least counting fingers. The patient tolerated the procedure well. There were  no complications. The patient received written and verbal post procedure care education. Post injection medications were not given.                 ASSESSMENT/PLAN:  Proliferative diabetic retinopathy of left eye with macular edema associated with type 2 diabetes mellitus (HCC) Chronic recurrent CME from CSME at current interval 8 weeks.  We will repeat intravitreal Eylea today  Patient does report picking up her CPAP materials today to commence use at night for some form of sleep apnea.      ICD-10-CM   1. Proliferative diabetic retinopathy of left eye with macular edema associated with type 2 diabetes mellitus (HCC)  H88.5027 OCT, Retina - OU - Both Eyes    Intravitreal Injection, Pharmacologic Agent - OS - Left Eye    aflibercept (EYLEA) SOLN 2 mg    1.  2.  3.  Ophthalmic Meds Ordered this visit:  Meds ordered this encounter  Medications  . aflibercept (EYLEA) SOLN 2 mg       Return in about 8 weeks (around 05/08/2020) for OS, dilate, EYLEA OCT.  There are no Patient Instructions on file for this visit.   Explained the diagnoses, plan, and follow up with the patient and they expressed understanding.  Patient expressed understanding of the importance of proper follow up care.   Clent Demark Alexea Blase M.D. Diseases & Surgery of the Retina and Vitreous Retina & Diabetic [place] 03/13/20     Abbreviations: M myopia (nearsighted); A astigmatism; H hyperopia (farsighted); P presbyopia; Mrx spectacle prescription;  CTL contact lenses; OD right eye; OS left eye; OU both eyes  XT exotropia; ET esotropia; PEK punctate epithelial keratitis; PEE punctate epithelial erosions; DES dry eye syndrome; MGD meibomian gland dysfunction; ATs artificial tears;  PFAT's preservative free artificial tears; [place] nuclear sclerotic cataract; PSC posterior subcapsular cataract; ERM epi-retinal membrane; PVD posterior vitreous detachment; RD retinal detachment; DM diabetes mellitus; DR diabetic retinopathy; NPDR non-proliferative diabetic retinopathy; PDR proliferative diabetic retinopathy; CSME clinically significant macular edema; DME diabetic macular edema; dbh dot blot hemorrhages; CWS cotton wool spot; POAG primary open angle glaucoma; C/D cup-to-disc ratio; HVF humphrey visual field; GVF goldmann visual field; OCT optical coherence tomography; IOP intraocular pressure; BRVO Branch retinal vein occlusion; CRVO central retinal vein occlusion; CRAO central retinal artery occlusion; BRAO branch retinal artery occlusion; RT retinal tear; SB scleral buckle; PPV pars plana vitrectomy; VH Vitreous hemorrhage; PRP panretinal laser photocoagulation; IVK intravitreal kenalog; VMT vitreomacular traction; MH Macular hole;  NVD neovascularization of the disc; NVE neovascularization elsewhere; AREDS age related eye disease study; ARMD age related macular degeneration; POAG primary open angle glaucoma; EBMD epithelial/anterior basement membrane dystrophy; ACIOL anterior chamber intraocular lens; IOL intraocular lens; PCIOL posterior chamber intraocular lens; Phaco/IOL phacoemulsification with intraocular lens placement; [place] photorefractive keratectomy; LASIK laser assisted in situ keratomileusis; HTN hypertension; DM diabetes mellitus; COPD chronic obstructive pulmonary disease

## 2020-03-22 ENCOUNTER — Encounter (INDEPENDENT_AMBULATORY_CARE_PROVIDER_SITE_OTHER): Payer: Medicare Other | Admitting: Ophthalmology

## 2020-04-17 ENCOUNTER — Encounter (INDEPENDENT_AMBULATORY_CARE_PROVIDER_SITE_OTHER): Payer: Medicare Other | Admitting: Ophthalmology

## 2020-04-26 ENCOUNTER — Other Ambulatory Visit: Payer: Self-pay

## 2020-04-26 ENCOUNTER — Encounter (INDEPENDENT_AMBULATORY_CARE_PROVIDER_SITE_OTHER): Payer: Self-pay | Admitting: Ophthalmology

## 2020-04-26 ENCOUNTER — Ambulatory Visit (INDEPENDENT_AMBULATORY_CARE_PROVIDER_SITE_OTHER): Payer: Medicare Other | Admitting: Ophthalmology

## 2020-04-26 DIAGNOSIS — E113511 Type 2 diabetes mellitus with proliferative diabetic retinopathy with macular edema, right eye: Secondary | ICD-10-CM

## 2020-04-26 MED ORDER — AFLIBERCEPT 2MG/0.05ML IZ SOLN FOR KALEIDOSCOPE
2.0000 mg | INTRAVITREAL | Status: AC | PRN
  Administered 2020-04-26: 2 mg via INTRAVITREAL

## 2020-04-26 NOTE — Assessment & Plan Note (Signed)
CSME is recurrent at 7-week interval.  With a history of multiple recurrences when we go beyond 5 to 6 weeks OD.  Repeat injection intravitreal Eylea OD today and examination in 5 to 6 weeks

## 2020-04-26 NOTE — Progress Notes (Signed)
04/26/2020     CHIEF COMPLAINT Patient presents for Retina Follow Up   HISTORY OF PRESENT ILLNESS: Grace [name] is a 69 y.o. female who presents to the clinic today for:   HPI    Retina Follow Up    Patient presents with  Diabetic Retinopathy.  In right eye.  This started 7 weeks ago.  Severity is mild.  Duration of 7 weeks.  Since onset it is gradually worsening.          Comments    7 Week Diabetic F/U OD, poss Eylea OD  Pt sts VA OD has declined over the past week. VA OS stable.  LBS: 89 this AM before breakfast       Last edited by Rockie Neighbours, [place] on 04/26/2020  3:36 PM. (History)      Referring physician: Unk Pinto, MD [address] [place] ,  [place] [address]  HISTORICAL INFORMATION:   Selected notes from the [facility]    Lab Results  Component Value Date   HGBA1C 7.7 (H) 11/10/2019     CURRENT MEDICATIONS: No current outpatient medications on file. (Ophthalmic Drugs)   No current facility-administered medications for this visit. (Ophthalmic Drugs)   Current Outpatient Medications (Other)  Medication Sig  . Ascorbic Acid (VITAMIN C PO) Take 1 tablet by mouth daily.  . Calcium Citrate-Vitamin D (CITRACAL/VITAMIN D PO) Take 1 tablet by mouth daily.  . Cholecalciferol (VITAMIN D PO) Take 1,000 Units by mouth 2 (two) times daily. Reported on 02/07/2016  . DULoxetine (CYMBALTA) 30 MG capsule Take 1 capsule (30 mg total) by mouth daily.  Marland Kitchen glimepiride (AMARYL) 2 MG tablet Take 1 tablet 1 or 2 x /day with Meals for Diabetes  . glucose blood (ACCU-CHEK GUIDE) test strip Check blood sugar 3 times a day-DX-E11.69 AND E78.5.  . Magnesium 250 MG TABS Take 2 tablets by mouth daily.   . metFORMIN (GLUCOPHAGE-XR) 500 MG 24 hr tablet Take 2 tablets    2 x   /day with Meals for Diabetes  . zinc gluconate 50 MG tablet Take 50 mg by mouth daily.   No current facility-administered medications for this visit. (Other)       REVIEW OF SYSTEMS:    ALLERGIES Allergies  Allergen Reactions  . Gabapentin Swelling    Swelling in legs and feet    PAST MEDICAL HISTORY Past Medical History:  Diagnosis Date  . Abnormality of gait 07/28/2013  . Diabetes ([place])   . Diabetic peripheral neuropathy ([place])   . Diabetic retinopathy ([place])   . Foot drop, bilateral 07/31/2014  . Peripheral edema   . Polyneuropathy in diabetes(357.2) 07/28/2013   Past Surgical History:  Procedure Laterality Date  . ABDOMINAL HYSTERECTOMY    . ANKLE FRACTURE SURGERY     left  . CATARACT EXTRACTION W/PHACO Right 02/21/2014   Dr. Herbert Deaner  . CATARACT EXTRACTION W/PHACO Left 04/10/2014   Dr. Herbert Deaner  . CATARACT EXTRACTION, BILATERAL    . HIP SURGERY     right  . TONSILLECTOMY      FAMILY HISTORY Family History  Problem Relation Age of Onset  . Diabetes Mother   . Diabetes Brother   . Neuropathy Neg Hx     SOCIAL HISTORY Social History   Tobacco Use  . Smoking status: Never Smoker  . Smokeless tobacco: Never Used  Substance Use Topics  . Alcohol use: No  . Drug use: No  OPHTHALMIC EXAM:  Base Eye Exam    Visual Acuity (ETDRS)      Right Left   Dist [place] 20/50 +2 20/70 +2   Dist ph [place] 20/40 +1 NI       Tonometry (Tonopen, 3:41 PM)      Right Left   Pressure 19 18       Pupils      Pupils Dark Light Shape React APD   Right PERRL 3 3 Round Minimal None   Left PERRL 3 3 Round Minimal None       Visual Fields (Counting fingers)      Left Right    Full Full       Extraocular Movement      Right Left    Full Full       Neuro/Psych    Oriented x3: Yes   Mood/Affect: Normal       Dilation    Right eye: 1.0% Mydriacyl, 2.5% Phenylephrine @ 3:41 PM        Slit Lamp and Fundus Exam    External Exam      Right Left   External Normal Normal       Slit Lamp Exam      Right Left   Lids/Lashes Normal Normal   Conjunctiva/Sclera White and quiet White and quiet   Cornea Clear  Clear   Anterior Chamber Deep and quiet Deep and quiet   Iris Round and reactive Round and reactive   Lens Posterior chamber intraocular lens Posterior chamber intraocular lens   Anterior Vitreous Normal Normal       Fundus Exam      Right Left   Posterior Vitreous Normal    Disc Normal    C/D Ratio 0.2    Macula Epiretinal membrane, good focal laser, Macular thickening    Vessels PDR-quiet    Periphery good PRP,, ATTACHED           IMAGING AND PROCEDURES  Imaging and Procedures for 04/26/20  OCT, Retina - OU - Both Eyes       Right Eye Quality was good. Central Foveal Thickness: 365. Progression has worsened. Findings include cystoid macular edema.   Left Eye Quality was good. Scan locations included subfoveal. Central Foveal Thickness: 409. Progression has been stable.   Notes CSME is recurrent at 7-week interval.  With a history of multiple recurrences when we go beyond 5 to 6 weeks OD.  OS with stable macular anatomy       Intravitreal Injection, Pharmacologic Agent - OD - Right Eye       Time Out 04/26/2020. 4:51 PM. Confirmed correct patient, procedure, site, and patient consented.   Anesthesia Topical anesthesia was used. Anesthetic medications included Akten 3.5%.   Procedure Preparation included Tobramycin 0.3%, 10% betadine to eyelids, 5% betadine to ocular surface. A 30 gauge needle was used.   Injection:  2 mg aflibercept [name]) SOLN   NDC: [id number], Lot: [id number]   Route: Intravitreal, Site: Right Eye, Waste: 0 mg  Post-op Post injection exam found visual acuity of at least counting fingers. The patient tolerated the procedure well. There were no complications. The patient received written and verbal post procedure care education. Post injection medications were not given.                 ASSESSMENT/PLAN:  Diabetic macular edema of right eye with proliferative retinopathy associated with type 2 diabetes mellitus (HCC) CSME is  recurrent at 7-week interval.  With a history of multiple recurrences when we go beyond 5 to 6 weeks OD.  Repeat injection intravitreal Eylea OD today and examination in 5 to 6 weeks      ICD-10-CM   1. Diabetic macular edema of right eye with proliferative retinopathy associated with type 2 diabetes mellitus (HCC)  E11.3511 OCT, Retina - OU - Both Eyes    Intravitreal Injection, Pharmacologic Agent - OD - Right Eye    aflibercept (EYLEA) SOLN 2 mg    1.  CSME recurs in the right eye at beyond 6-week interval exam, thus we will plan repeat examination in 5 weeks after intravitreal Eylea today  2.  Follow-up OS as scheduled  3.  Ophthalmic Meds Ordered this visit:  Meds ordered this encounter  Medications  . aflibercept (EYLEA) SOLN 2 mg       Return in about 5 weeks (around 05/31/2020) for dilate, OD, EYLEA OCT.  There are no Patient Instructions on file for this visit.   Explained the diagnoses, plan, and follow up with the patient and they expressed understanding.  Patient expressed understanding of the importance of proper follow up care.   Clent Demark Karthikeya Funke M.D. Diseases & Surgery of the Retina and Vitreous Retina & Diabetic [place] 04/26/20     Abbreviations: M myopia (nearsighted); A astigmatism; H hyperopia (farsighted); P presbyopia; Mrx spectacle prescription;  CTL contact lenses; OD right eye; OS left eye; OU both eyes  XT exotropia; ET esotropia; PEK punctate epithelial keratitis; PEE punctate epithelial erosions; DES dry eye syndrome; MGD meibomian gland dysfunction; ATs artificial tears; PFAT's preservative free artificial tears; [place] nuclear sclerotic cataract; PSC posterior subcapsular cataract; ERM epi-retinal membrane; PVD posterior vitreous detachment; RD retinal detachment; DM diabetes mellitus; DR diabetic retinopathy; NPDR non-proliferative diabetic retinopathy; PDR proliferative diabetic retinopathy; CSME clinically significant macular edema; DME diabetic  macular edema; dbh dot blot hemorrhages; CWS cotton wool spot; POAG primary open angle glaucoma; C/D cup-to-disc ratio; HVF humphrey visual field; GVF goldmann visual field; OCT optical coherence tomography; IOP intraocular pressure; BRVO Branch retinal vein occlusion; CRVO central retinal vein occlusion; CRAO central retinal artery occlusion; BRAO branch retinal artery occlusion; RT retinal tear; SB scleral buckle; PPV pars plana vitrectomy; VH Vitreous hemorrhage; PRP panretinal laser photocoagulation; IVK intravitreal kenalog; VMT vitreomacular traction; MH Macular hole;  NVD neovascularization of the disc; NVE neovascularization elsewhere; AREDS age related eye disease study; ARMD age related macular degeneration; POAG primary open angle glaucoma; EBMD epithelial/anterior basement membrane dystrophy; ACIOL anterior chamber intraocular lens; IOL intraocular lens; PCIOL posterior chamber intraocular lens; Phaco/IOL phacoemulsification with intraocular lens placement; [place] photorefractive keratectomy; LASIK laser assisted in situ keratomileusis; HTN hypertension; DM diabetes mellitus; COPD chronic obstructive pulmonary disease

## 2020-05-08 ENCOUNTER — Other Ambulatory Visit: Payer: Self-pay

## 2020-05-08 ENCOUNTER — Ambulatory Visit (INDEPENDENT_AMBULATORY_CARE_PROVIDER_SITE_OTHER): Payer: Medicare Other | Admitting: Ophthalmology

## 2020-05-08 ENCOUNTER — Encounter (INDEPENDENT_AMBULATORY_CARE_PROVIDER_SITE_OTHER): Payer: Self-pay | Admitting: Ophthalmology

## 2020-05-08 DIAGNOSIS — G4730 Sleep apnea, unspecified: Secondary | ICD-10-CM

## 2020-05-08 DIAGNOSIS — E113512 Type 2 diabetes mellitus with proliferative diabetic retinopathy with macular edema, left eye: Secondary | ICD-10-CM

## 2020-05-08 DIAGNOSIS — G4733 Obstructive sleep apnea (adult) (pediatric): Secondary | ICD-10-CM | POA: Insufficient documentation

## 2020-05-08 DIAGNOSIS — Z9989 Dependence on other enabling machines and devices: Secondary | ICD-10-CM | POA: Insufficient documentation

## 2020-05-08 MED ORDER — AFLIBERCEPT 2MG/0.05ML IZ SOLN FOR KALEIDOSCOPE
2.0000 mg | INTRAVITREAL | Status: AC | PRN
  Administered 2020-05-08: 2 mg via INTRAVITREAL

## 2020-05-08 NOTE — Assessment & Plan Note (Signed)
At 8-week follow-up today, vastly improved macular perfusion less CME, coincident with last 5 to 6 weeks of CPAP use regularly

## 2020-05-08 NOTE — Assessment & Plan Note (Signed)
Reports that enhanced sense of wellbeing and more energy throughout the day.  Making the fit of the mask and/or change to nasal pillars

## 2020-05-08 NOTE — Progress Notes (Signed)
05/08/2020     CHIEF COMPLAINT Patient presents for Retina Follow Up   HISTORY OF PRESENT ILLNESS: Grace [name] is a 69 y.o. female who presents to the clinic today for:   HPI    Retina Follow Up    Patient presents with  Diabetic Retinopathy.  In left eye.  Severity is moderate.  Duration of 8 weeks.  Since onset it is stable.  I, the attending physician,  performed the HPI with the patient and updated documentation appropriately.          Comments    8 Weel PDR f\u OS. Possible Eylea OS. OCT  Pt states vision has slightly improved since last visit. Denies any complaints. BGL: did not check, 139 the other day       Last edited by Tilda Franco on 05/08/2020  3:47 PM. (History)      Referring physician: Unk Pinto, MD [address] [place],  [place] [address]  HISTORICAL INFORMATION:   Selected notes from the [facility]    Lab Results  Component Value Date   HGBA1C 7.7 (H) 11/10/2019     CURRENT MEDICATIONS: No current outpatient medications on file. (Ophthalmic Drugs)   No current facility-administered medications for this visit. (Ophthalmic Drugs)   Current Outpatient Medications (Other)  Medication Sig  . Ascorbic Acid (VITAMIN C PO) Take 1 tablet by mouth daily.  . Calcium Citrate-Vitamin D (CITRACAL/VITAMIN D PO) Take 1 tablet by mouth daily.  . Cholecalciferol (VITAMIN D PO) Take 1,000 Units by mouth 2 (two) times daily. Reported on 02/07/2016  . DULoxetine (CYMBALTA) 30 MG capsule Take 1 capsule (30 mg total) by mouth daily.  Marland Kitchen glimepiride (AMARYL) 2 MG tablet Take 1 tablet 1 or 2 x /day with Meals for Diabetes  . glucose blood (ACCU-CHEK GUIDE) test strip Check blood sugar 3 times a day-DX-E11.69 AND E78.5.  . Magnesium 250 MG TABS Take 2 tablets by mouth daily.   . metFORMIN (GLUCOPHAGE-XR) 500 MG 24 hr tablet Take 2 tablets    2 x   /day with Meals for Diabetes  . zinc gluconate 50 MG tablet Take 50 mg by  mouth daily.   No current facility-administered medications for this visit. (Other)      REVIEW OF SYSTEMS: ROS    Positive for: Endocrine   Last edited by Tilda Franco on 05/08/2020  3:47 PM. (History)       ALLERGIES Allergies  Allergen Reactions  . Gabapentin Swelling    Swelling in legs and feet    PAST MEDICAL HISTORY Past Medical History:  Diagnosis Date  . Abnormality of gait 07/28/2013  . Diabetes ([place])   . Diabetic peripheral neuropathy ([place])   . Diabetic retinopathy ([place])   . Foot drop, bilateral 07/31/2014  . Peripheral edema   . Polyneuropathy in diabetes(357.2) 07/28/2013   Past Surgical History:  Procedure Laterality Date  . ABDOMINAL HYSTERECTOMY    . ANKLE FRACTURE SURGERY     left  . CATARACT EXTRACTION W/PHACO Right 02/21/2014   Dr. Herbert Deaner  . CATARACT EXTRACTION W/PHACO Left 04/10/2014   Dr. Herbert Deaner  . CATARACT EXTRACTION, BILATERAL    . HIP SURGERY     right  . TONSILLECTOMY      FAMILY HISTORY Family History  Problem Relation Age of Onset  . Diabetes Mother   . Diabetes Brother   . Neuropathy Neg Hx     SOCIAL HISTORY Social History   Tobacco  Use  . Smoking status: Never Smoker  . Smokeless tobacco: Never Used  Substance Use Topics  . Alcohol use: No  . Drug use: No         OPHTHALMIC EXAM:  Base Eye Exam    Visual Acuity (Snellen - Linear)      Right Left   Dist [name] 20/50 + 20/80   Dist ph [name] 20/30 -2 20/60 -2       Tonometry (Tonopen, 3:51 PM)      Right Left   Pressure 13 15       Pupils      Pupils Dark Light Shape React APD   Right PERRL 3 3 Round Minimal None   Left PERRL 3 3 Round Minimal None       Neuro/Psych    Oriented x3: Yes   Mood/Affect: Normal       Dilation    Left eye: 1.0% Mydriacyl, 2.5% Phenylephrine @ 3:51 PM        Slit Lamp and Fundus Exam    External Exam      Right Left   External Normal Normal       Slit Lamp Exam      Right Left   Lids/Lashes Normal Normal    Conjunctiva/Sclera White and quiet White and quiet   Cornea Clear Clear   Anterior Chamber Deep and quiet Deep and quiet   Iris Round and reactive Round and reactive   Lens Posterior chamber intraocular lens Posterior chamber intraocular lens   Anterior Vitreous Normal Normal       Fundus Exam      Right Left   Posterior Vitreous  Posterior vitreous detachment   Disc  Normal   C/D Ratio  0.2-0.3   Macula  Microaneurysms, Macular thickening, Clinically significant macular edema, Mild clinically significant macular edema   Vessels  Severe nonproliferative diabetic retinopathy with retinal nonperfusion   Periphery  Good peripheral PRP          IMAGING AND PROCEDURES  Imaging and Procedures for 05/08/20  OCT, Retina - OU - Both Eyes       Right Eye Quality was good. Scan locations included subfoveal. Central Foveal Thickness: 279. Findings include epiretinal membrane.   Left Eye Quality was good. Scan locations included subfoveal. Central Foveal Thickness: 350.   Notes OD, vastly improved macular anatomy, now coincident with recent injection Eylea but also on 6 weeks of CPAP use regularly  OS also improved in the last 2 weeks, nearing the end of an 8-week follow-up.,  And better over the last 12 days.  This raises the question of possible role of CPAP the macular perfusion       Intravitreal Injection, Pharmacologic Agent - OS - Left Eye       Time Out 05/08/2020. 4:10 PM. Confirmed correct patient, procedure, site, and patient consented.   Anesthesia Topical anesthesia was used. Anesthetic medications included Akten 3.5%.   Procedure Preparation included Tobramycin 0.3%, 5% betadine to ocular surface, 10% betadine to eyelids. A 30 gauge needle was used.   Injection:  2 mg aflibercept [name]) SOLN   NDC: [id number], Lot: [id number]   Route: Intravitreal, Site: Left Eye, Waste: 0 mg  Post-op Post injection exam found visual acuity of at least counting fingers.  The patient tolerated the procedure well. There were no complications. The patient received written and verbal post procedure care education. Post injection medications were not given.  ASSESSMENT/PLAN:  Proliferative diabetic retinopathy of left eye with macular edema associated with type 2 diabetes mellitus (HCC) At 8-week follow-up today, vastly improved macular perfusion less CME, coincident with last 5 to 6 weeks of CPAP use regularly      ICD-10-CM   1. Proliferative diabetic retinopathy of left eye with macular edema associated with type 2 diabetes mellitus (HCC)  W46.6599 OCT, Retina - OU - Both Eyes    Intravitreal Injection, Pharmacologic Agent - OS - Left Eye    aflibercept (EYLEA) SOLN 2 mg  2. Sleep apnea in adult  G47.30     1.  Repeat intravitreal Eylea OS today, at 8-week interval.  CME component of CSME OS is improved most definitely today, the best it looks a long time and this could be coincident with use of CPAP on a regular basis now over the last 6 weeks 2.  Follow-up examination OD as scheduled  3.  Ophthalmic Meds Ordered this visit:  Meds ordered this encounter  Medications  . aflibercept (EYLEA) SOLN 2 mg       Return in about 8 weeks (around 07/03/2020) for dilate, OS, EYLEA OCT.  There are no Patient Instructions on file for this visit.   Explained the diagnoses, plan, and follow up with the patient and they expressed understanding.  Patient expressed understanding of the importance of proper follow up care.   Clent Demark Rankin M.D. Diseases & Surgery of the Retina and Vitreous Retina & Diabetic [place] 05/08/20     Abbreviations: M myopia (nearsighted); A astigmatism; H hyperopia (farsighted); P presbyopia; Mrx spectacle prescription;  CTL contact lenses; OD right eye; OS left eye; OU both eyes  XT exotropia; ET esotropia; PEK punctate epithelial keratitis; PEE punctate epithelial erosions; DES dry eye syndrome; MGD  meibomian gland dysfunction; ATs artificial tears; PFAT's preservative free artificial tears; [place] nuclear sclerotic cataract; PSC posterior subcapsular cataract; ERM epi-retinal membrane; PVD posterior vitreous detachment; RD retinal detachment; DM diabetes mellitus; DR diabetic retinopathy; NPDR non-proliferative diabetic retinopathy; PDR proliferative diabetic retinopathy; CSME clinically significant macular edema; DME diabetic macular edema; dbh dot blot hemorrhages; CWS cotton wool spot; POAG primary open angle glaucoma; C/D cup-to-disc ratio; HVF humphrey visual field; GVF goldmann visual field; OCT optical coherence tomography; IOP intraocular pressure; BRVO Branch retinal vein occlusion; CRVO central retinal vein occlusion; CRAO central retinal artery occlusion; BRAO branch retinal artery occlusion; RT retinal tear; SB scleral buckle; PPV pars plana vitrectomy; VH Vitreous hemorrhage; PRP panretinal laser photocoagulation; IVK intravitreal kenalog; VMT vitreomacular traction; MH Macular hole;  NVD neovascularization of the disc; NVE neovascularization elsewhere; AREDS age related eye disease study; ARMD age related macular degeneration; POAG primary open angle glaucoma; EBMD epithelial/anterior basement membrane dystrophy; ACIOL anterior chamber intraocular lens; IOL intraocular lens; PCIOL posterior chamber intraocular lens; Phaco/IOL phacoemulsification with intraocular lens placement; [place] photorefractive keratectomy; LASIK laser assisted in situ keratomileusis; HTN hypertension; DM diabetes mellitus; COPD chronic obstructive pulmonary disease

## 2020-05-13 ENCOUNTER — Other Ambulatory Visit: Payer: Self-pay | Admitting: Internal Medicine

## 2020-05-22 ENCOUNTER — Encounter: Payer: Self-pay | Admitting: Internal Medicine

## 2020-05-22 NOTE — Patient Instructions (Signed)

## 2020-05-22 NOTE — Progress Notes (Signed)
Comprehensive Evaluation &  Examination     This very nice 69 y.o.  MWF  presents for a  comprehensive evaluation and management of multiple medical co-morbidities.  Patient has been followed for HTN, HLD, T2_NIDDM  and Vitamin D Deficiency. She reports her CPAP improves her sleep hygiene.       HTN predates circa 1960's. Patient's BP has been controlled at home and patient denies any cardiac symptoms as chest pain, palpitations, shortness of breath, dizziness or ankle swelling. Today's BP is at goal -  110/72.      Patient's hyperlipidemia is controlled with diet . Last lipids were not at goal:  Lab Results  Component Value Date   CHOL 219 (H) 11/10/2019   HDL 57 11/10/2019   LDLCALC 128 (H) 11/10/2019   TRIG 206 (H) 11/10/2019   CHOLHDL 3.8 11/10/2019       Patient has hx/ Gestational DM in 1980 and a honeymoon period til 2006 when started on Metformin for T2_NIDDM w/CKD3a (GFR 57) Diab Retinopathy, Wet Mac Deg and Peripheral sensory Neuropathy.   Patient denies reactive hypoglycemic symptoms, does have impaired vision & paresthesias, but denies Diabetic Polys.he reports that she is essentially blind  In the Left eye. Patient is followed closely by Dr [name] for proliferative diabetic retinopathy.  Last A1c was not at goal:  Lab Results  Component Value Date   HGBA1C 7.7 (H) 11/10/2019       Finally, patient has history of Vitamin D Deficiency ("26" /2016) and last Vitamin D was at goal:  Lab Results  Component Value Date   VD25OH 63 11/10/2019    Current Outpatient Medications on File Prior to Visit  Medication Sig  . Ascorbic Acid (VITAMIN C PO) Take 1 tablet  daily.  . Calcium Citrate-Vitamin D  Take 1 tablet  daily.  Marland Kitchen VITAMIN D Take 1,000 Units  2 (two) times daily.   . DULoxetine  30 MG capsule Take 1 capsule  daily.  Marland Kitchen glimepiride  2 MG tablet Take     1 tablet    2 x /day with Meals     For Diabetes  . Magnesium 250 MG TABS Take 2 tablets daily.   .  metFORMIN-XR)500 MG 24 hr tablet Take 2 tablets    2 x   /day with Meals for Diabetes  . Omega-3 FISH OIL Take 1 capsule daily.  Marland Kitchen zinc  50 MG tablet Take  daily.    Allergies  Allergen Reactions  . Gabapentin Swelling    Swelling in legs and feet   Past Medical History:  Diagnosis Date  . Abnormality of gait 07/28/2013  . Diabetes ([place])   . Diabetic peripheral neuropathy ([place])   . Diabetic retinopathy ([place])   . Foot drop, bilateral 07/31/2014  . Peripheral edema   . Polyneuropathy in diabetes(357.2) 07/28/2013   Health Maintenance  Topic Date Due  . COVID-19 Vaccine (1) Never done  . INFLUENZA VACCINE  03/18/2020  . URINE MICROALBUMIN  05/02/2020  . HEMOGLOBIN A1C  05/12/2020  . FOOT EXAM  08/08/2020  . Fecal DNA (Cologuard)  04/21/2021  . OPHTHALMOLOGY EXAM  05/08/2021  . MAMMOGRAM  08/22/2021  . TETANUS/TDAP  02/06/2026  . DEXA SCAN  Completed  . PNA vac Low Risk Adult  Completed   Immunization History  Administered Date(s) Administered  . Influenza, High Dose Seasonal PF 05/12/2016, 07/03/2017, 05/24/2018, 06/10/2019  . Influenza-Unspecified 07/03/2017  . Pneumococcal Conjugate-13 02/07/2016  . Pneumococcal Polysaccharide-23 09/03/2017  .  Td 02/07/2016    - Cologard - 04/21/2018 - Negative  - 3 yr f/u due Sept 2022  Last MGM - 03/12/2020 - 6 mo f/u scheduled 08/24/2020  Past Surgical History:  Procedure Laterality Date  . ABDOMINAL HYSTERECTOMY    . ANKLE FRACTURE SURGERY     left  . CATARACT EXTRACTION W/PHACO Right 02/21/2014   Dr. Herbert Deaner  . CATARACT EXTRACTION W/PHACO Left 04/10/2014   Dr. Herbert Deaner  . CATARACT EXTRACTION, BILATERAL    . HIP SURGERY     right  . TONSILLECTOMY     Family History  Problem Relation Age of Onset  . Diabetes Mother   . Diabetes Brother   . Neuropathy Neg Hx    Social History   Tobacco Use  . Smoking status: Never Smoker  . Smokeless tobacco: Never Used  Substance Use Topics  . Alcohol use: No  . Drug use: No     ROS Constitutional: Denies fever, chills, weight loss/gain, headaches, insomnia,  night sweats, and change in appetite. Does c/o fatigue. Eyes: Denies redness, blurred vision, diplopia, discharge, itchy, watery eyes.  ENT: Denies discharge, congestion, post nasal drip, epistaxis, sore throat, earache, hearing loss, dental pain, Tinnitus, Vertigo, Sinus pain, snoring.  Cardio: Denies chest pain, palpitations, irregular heartbeat, syncope, dyspnea, diaphoresis, orthopnea, PND, claudication, edema Respiratory: denies cough, dyspnea, DOE, pleurisy, hoarseness, laryngitis, wheezing.  Gastrointestinal: Denies dysphagia, heartburn, reflux, water brash, pain, cramps, nausea, vomiting, bloating, diarrhea, constipation, hematemesis, melena, hematochezia, jaundice, hemorrhoids Genitourinary: Denies dysuria, frequency, urgency, nocturia, hesitancy, discharge, hematuria, flank pain Breast: Breast lumps, nipple discharge, bleeding.  Musculoskeletal: Denies arthralgia, myalgia, stiffness, Jt. Swelling, pain, limp, and strain/sprain. Denies falls. Skin: Denies puritis, rash, hives, warts, acne, eczema, changing in skin lesion Neuro: No weakness, tremor, incoordination, spasms, paresthesia, pain Psychiatric: Denies confusion, memory loss, sensory loss. Denies Depression. Endocrine: Denies change in weight, skin, hair change, nocturia and , diabetic polys, visual blurring, hyper /hypo glycemic episodes, but has numb paresthesias of feet. Heme/Lymph: No excessive bleeding, bruising, enlarged lymph nodes.  Physical Exam  BP 110/72   Pulse 65   Temp (!) 97.5 F (36.4 C)   Resp 16   Ht 5' 4.5" (1.638 m)   Wt 174 lb 3.2 oz (79 kg)   SpO2 98%   BMI 29.44 kg/m   General Appearance: Well nourished, well groomed and in no apparent distress.  Eyes: Pupils constricted and equal & fundi not well visualized., EOMs, conjunctiva no swelling or erythema. Sinuses: No frontal/maxillary tenderness ENT/Mouth: EACs  patent / TMs  nl. Nares clear without erythema, swelling, mucoid exudates. Oral hygiene is good. No erythema, swelling, or exudate. Tongue normal, non-obstructing. Tonsils not swollen or erythematous. Hearing normal.  Neck: Supple, thyroid not palpable. No bruits, nodes or JVD. Respiratory: Respiratory effort normal.  BS equal and clear bilateral without rales, rhonci, wheezing or stridor. Cardio: Heart sounds are normal with regular rate and rhythm and no murmurs, rubs or gallops. Peripheral pulses are normal and equal bilaterally without edema. No aortic or femoral bruits. Chest: symmetric with normal excursions and percussion. Breasts: Symmetric, without lumps, nipple discharge, retractions, or fibrocystic changes.  Abdomen: Flat, soft with bowel sounds active. Nontender, no guarding, rebound, hernias, masses, or organomegaly.  Lymphatics: Non tender without lymphadenopathy.  Musculoskeletal: Full ROM all peripheral extremities, joint stability, 5/5 strength and walks with a walker with broad based gait.  Skin: Warm and dry without rashes, lesions, cyanosis, clubbing or  ecchymosis.  Neuro: Cranial nerves intact, reflexes flat bilaterally.  Normal muscle tone, no cerebellar symptoms. Sensation decreased in a stocking distribution to touch, vibratory and Monofilament to the toes bilaterally. Pysch: Alert and oriented x 3, normal affect, Insight and Judgment appropriate.   Assessment and Plan  1. Essential hypertension  - EKG 12-Lead - Urinalysis, Routine w reflex microscopic - Microalbumin / creatinine urine ratio - CBC with Differential/Platelet - COMPLETE METABOLIC PANEL WITH GFR - Magnesium - TSH  2. Hyperlipidemia associated with type 2 diabetes mellitus ([place])  - EKG 12-Lead - Lipid panel - TSH  3. Type 2 diabetes mellitus with stage 3a chronic kidney  disease, without long-term current use of insulin (HCC)  - EKG 12-Lead - Urinalysis, Routine w reflex microscopic -  Microalbumin / creatinine urine ratio - PTH, intact and calcium - Hemoglobin A1c - Insulin, random  4. Vitamin D deficiency  - VITAMIN D 25 Hydroxy  5. Diabetic polyneuropathy associated with diabetes  mellitus due to underlying condition (HCC)  - HM DIABETES FOOT EXAM - LOW EXTREMITY NEUR EXAM DOCUM - Hemoglobin A1c - Insulin, random  6. Diabetic retinopathy of both eyes with macular edema associated with diabetes  mellitus due to underlying condition, unspecified retinopathy severity ([place])  - Hemoglobin A1c - Insulin, random  7. OSA on CPAP   8. Screening for colorectal cancer  - POC Hemoccult Bld/Stl   9. Screening for ischemic heart disease  - EKG 12-Lead  10. FH: hypertension  - EKG 12-Lead  11. Medication management  - Urinalysis, Routine w reflex microscopic - Microalbumin / creatinine urine ratio - CBC with Differential/Platelet - COMPLETE METABOLIC PANEL WITH GFR - Magnesium - Lipid panel - TSH - Hemoglobin A1c - Insulin, random - VITAMIN D 25 Hydroxy            Patient was counseled in prudent diet to achieve/maintain BMI less than 25 for weight control, BP monitoring, regular exercise and medications. Discussed med's effects and SE's. Screening labs and tests as requested with regular follow-up as recommended. Over 40 minutes of exam, counseling, chart review and high complex critical decision making was performed.   Kirtland Bouchard, MD

## 2020-05-23 ENCOUNTER — Other Ambulatory Visit: Payer: Self-pay

## 2020-05-23 ENCOUNTER — Ambulatory Visit (INDEPENDENT_AMBULATORY_CARE_PROVIDER_SITE_OTHER): Payer: Medicare Other | Admitting: Internal Medicine

## 2020-05-23 ENCOUNTER — Other Ambulatory Visit: Payer: Self-pay | Admitting: Internal Medicine

## 2020-05-23 VITALS — BP 110/72 | HR 65 | Temp 97.5°F | Resp 16 | Ht 64.5 in | Wt 174.2 lb

## 2020-05-23 DIAGNOSIS — E785 Hyperlipidemia, unspecified: Secondary | ICD-10-CM

## 2020-05-23 DIAGNOSIS — E1169 Type 2 diabetes mellitus with other specified complication: Secondary | ICD-10-CM | POA: Diagnosis not present

## 2020-05-23 DIAGNOSIS — E08311 Diabetes mellitus due to underlying condition with unspecified diabetic retinopathy with macular edema: Secondary | ICD-10-CM

## 2020-05-23 DIAGNOSIS — G4733 Obstructive sleep apnea (adult) (pediatric): Secondary | ICD-10-CM | POA: Diagnosis not present

## 2020-05-23 DIAGNOSIS — E559 Vitamin D deficiency, unspecified: Secondary | ICD-10-CM

## 2020-05-23 DIAGNOSIS — E0842 Diabetes mellitus due to underlying condition with diabetic polyneuropathy: Secondary | ICD-10-CM | POA: Diagnosis not present

## 2020-05-23 DIAGNOSIS — Z8249 Family history of ischemic heart disease and other diseases of the circulatory system: Secondary | ICD-10-CM | POA: Diagnosis not present

## 2020-05-23 DIAGNOSIS — E1122 Type 2 diabetes mellitus with diabetic chronic kidney disease: Secondary | ICD-10-CM | POA: Diagnosis not present

## 2020-05-23 DIAGNOSIS — I10 Essential (primary) hypertension: Secondary | ICD-10-CM | POA: Diagnosis not present

## 2020-05-23 DIAGNOSIS — N1831 Chronic kidney disease, stage 3a: Secondary | ICD-10-CM

## 2020-05-23 DIAGNOSIS — Z136 Encounter for screening for cardiovascular disorders: Secondary | ICD-10-CM

## 2020-05-23 DIAGNOSIS — Z1211 Encounter for screening for malignant neoplasm of colon: Secondary | ICD-10-CM

## 2020-05-23 DIAGNOSIS — Z79899 Other long term (current) drug therapy: Secondary | ICD-10-CM

## 2020-05-23 DIAGNOSIS — Z9989 Dependence on other enabling machines and devices: Secondary | ICD-10-CM

## 2020-05-24 ENCOUNTER — Encounter: Payer: Self-pay | Admitting: *Deleted

## 2020-05-24 LAB — COMPLETE METABOLIC PANEL WITH GFR
AG Ratio: 2 (calc) (ref 1.0–2.5)
ALT: 18 U/L (ref 6–29)
AST: 14 U/L (ref 10–35)
Albumin: 3.9 g/dL (ref 3.6–5.1)
Alkaline phosphatase (APISO): 55 U/L (ref 37–153)
BUN/Creatinine Ratio: 19 (calc) (ref 6–22)
BUN: 20 mg/dL (ref 7–25)
CO2: 33 mmol/L — ABNORMAL HIGH (ref 20–32)
Calcium: 9.8 mg/dL (ref 8.6–10.4)
Chloride: 103 mmol/L (ref 98–110)
Creat: 1.08 mg/dL — ABNORMAL HIGH (ref 0.60–0.93)
GFR, Est African American: 60 mL/min/{1.73_m2} (ref >=60)
GFR, Est Non African American: 52 mL/min/{1.73_m2} — ABNORMAL LOW (ref >=60)
Globulin: 2 g/dL (calc) (ref 1.9–3.7)
Glucose, Bld: 159 mg/dL — ABNORMAL HIGH (ref 65–99)
Potassium: 4.7 mmol/L (ref 3.5–5.3)
Sodium: 142 mmol/L (ref 135–146)
Total Bilirubin: 0.3 mg/dL (ref 0.2–1.2)
Total Protein: 5.9 g/dL — ABNORMAL LOW (ref 6.1–8.1)

## 2020-05-24 LAB — URINALYSIS, ROUTINE W REFLEX MICROSCOPIC
Bacteria, UA: NONE SEEN /HPF
Bilirubin Urine: NEGATIVE
Glucose, UA: NEGATIVE
Hgb urine dipstick: NEGATIVE
Hyaline Cast: NONE SEEN /LPF
Ketones, ur: NEGATIVE
Nitrite: NEGATIVE
Protein, ur: NEGATIVE
RBC / HPF: NONE SEEN /HPF (ref 0–2)
Specific Gravity, Urine: 1.013 (ref 1.001–1.03)
pH: 6 (ref 5.0–8.0)

## 2020-05-24 LAB — INSULIN, RANDOM: Insulin: 11.2 u[IU]/mL

## 2020-05-24 LAB — MICROALBUMIN / CREATININE URINE RATIO
Creatinine, Urine: 121 mg/dL (ref 20–275)
Microalb Creat Ratio: 4 mcg/mg creat (ref <30)
Microalb, Ur: 0.5 mg/dL

## 2020-05-24 LAB — CBC WITH DIFFERENTIAL/PLATELET
Absolute Monocytes: 470 cells/uL (ref 200–950)
Basophils Absolute: 52 cells/uL (ref 0–200)
Basophils Relative: 0.9 %
Eosinophils Absolute: 290 cells/uL (ref 15–500)
Eosinophils Relative: 5 %
HCT: 38 % (ref 35.0–45.0)
Hemoglobin: 11.8 g/dL (ref 11.7–15.5)
Lymphs Abs: 1676 cells/uL (ref 850–3900)
MCH: 29.3 pg (ref 27.0–33.0)
MCHC: 31.1 g/dL — ABNORMAL LOW (ref 32.0–36.0)
MCV: 94.3 fL (ref 80.0–100.0)
MPV: 9.7 fL (ref 7.5–12.5)
Monocytes Relative: 8.1 %
Neutro Abs: 3312 cells/uL (ref 1500–7800)
Neutrophils Relative %: 57.1 %
Platelets: 397 10*3/uL (ref 140–400)
RBC: 4.03 10*6/uL (ref 3.80–5.10)
RDW: 13.5 % (ref 11.0–15.0)
Total Lymphocyte: 28.9 %
WBC: 5.8 10*3/uL (ref 3.8–10.8)

## 2020-05-24 LAB — LIPID PANEL
Cholesterol: 185 mg/dL (ref <200)
HDL: 54 mg/dL (ref >=50)
LDL Cholesterol (Calc): 100 mg/dL (calc) — ABNORMAL HIGH
Non-HDL Cholesterol (Calc): 131 mg/dL (calc) — ABNORMAL HIGH (ref <130)
Total CHOL/HDL Ratio: 3.4 (calc) (ref <5.0)
Triglycerides: 191 mg/dL — ABNORMAL HIGH (ref <150)

## 2020-05-24 LAB — PTH, INTACT AND CALCIUM
Calcium: 9.8 mg/dL (ref 8.6–10.4)
PTH: 35 pg/mL (ref 14–64)

## 2020-05-24 LAB — HEMOGLOBIN A1C
Hgb A1c MFr Bld: 6.9 % of total Hgb — ABNORMAL HIGH (ref <5.7)
Mean Plasma Glucose: 151 (calc)
eAG (mmol/L): 8.4 (calc)

## 2020-05-24 LAB — MAGNESIUM: Magnesium: 1.7 mg/dL (ref 1.5–2.5)

## 2020-05-24 LAB — VITAMIN D 25 HYDROXY (VIT D DEFICIENCY, FRACTURES): Vit D, 25-Hydroxy: 70 ng/mL (ref 30–100)

## 2020-05-24 LAB — TSH: TSH: 2.01 mIU/L (ref 0.40–4.50)

## 2020-05-24 NOTE — Progress Notes (Signed)
==========================================================  -    U/A - not clean catch - Negative Nitrates & OK  ==========================================================  - Chol = 185 better - was 219  ==========================================================  - Kidney functions - Still Stage 3a & stable  . ==========================================================  - A1c - better - down from 7.7% to now 6.9 % - Great  ! - But still too high    - ideal or goal is less than 6.0% , So...........    Being diabetic has a  300% increased risk for heart attack,  stroke, cancer, and alzheimer- type vascular dementia.   It is very important that you work harder with diet by  avoiding all foods that are white except chicken,   fish & calliflower.  - Avoid white rice  (brown & wild rice is OK),   - Avoid white potatoes  (sweet potatoes in moderation is OK),   White bread or wheat bread or anything made out of   white flour like bagels, donuts, rolls, buns, biscuits, cakes,  - pastries, cookies, pizza crust, and pasta (made from  white flour & egg whites)   - vegetarian pasta or spinach or wheat pasta is OK.  - Multigrain breads like Arnold's, Pepperidge Farm or   multigrain sandwich thins or high fiber breads like   [place] bread or "Dave's Killer" breads that are  4 to 5 grams fiber per slice !  are best.   ==========================================================  - Vitamin D = 70 - Excellent  ==========================================================  - All Else - CBC- Electrolytes - Liver - Magnesium & Thyroid    - all  Normal / OK ====================================================

## 2020-05-24 NOTE — Progress Notes (Signed)
PATIENT IS AWARE OF LAB RESULTS AND WOULD LIKE A COPY TO BE MAILED TO HER. -E WELCH

## 2020-06-04 ENCOUNTER — Other Ambulatory Visit: Payer: Self-pay

## 2020-06-04 ENCOUNTER — Encounter (INDEPENDENT_AMBULATORY_CARE_PROVIDER_SITE_OTHER): Payer: Medicare Other | Admitting: Ophthalmology

## 2020-06-04 ENCOUNTER — Encounter (INDEPENDENT_AMBULATORY_CARE_PROVIDER_SITE_OTHER): Payer: Self-pay | Admitting: Ophthalmology

## 2020-06-04 ENCOUNTER — Ambulatory Visit (INDEPENDENT_AMBULATORY_CARE_PROVIDER_SITE_OTHER): Payer: Medicare Other | Admitting: Ophthalmology

## 2020-06-04 DIAGNOSIS — G4730 Sleep apnea, unspecified: Secondary | ICD-10-CM | POA: Diagnosis not present

## 2020-06-04 DIAGNOSIS — E113511 Type 2 diabetes mellitus with proliferative diabetic retinopathy with macular edema, right eye: Secondary | ICD-10-CM | POA: Diagnosis not present

## 2020-06-04 MED ORDER — AFLIBERCEPT 2MG/0.05ML IZ SOLN FOR KALEIDOSCOPE
2.0000 mg | INTRAVITREAL | Status: AC | PRN
  Administered 2020-06-04: 2 mg via INTRAVITREAL

## 2020-06-04 NOTE — Assessment & Plan Note (Signed)
The nature of regressed proliferative diabetic retinopathy was discussed with the patient. The patient was advised to maintain good glucose, blood pressure, monitor kidney function and serum lipid control as advised by personal physician. Rare risk for reactivation of progression exist with untreated severe anemia, untreated renal failure, untreated heart failure, and smoking. Complete avoidance of smoking was recommended. The chance of recurrent proliferative diabetic retinopathy was discussed as well as the chance of vitreous hemorrhage for which further treatments may be necessary.   Explained to the patient that the quiescent  proliferative diabetic retinopathy disease is unlikely to ever worsen.  Worsening factors would include however severe anemia, hypertension out-of-control or impending renal failure.  Much less since patient has been on CPAP for the last 10 weeks in conjunction with these injections CSME OD now improved

## 2020-06-04 NOTE — Assessment & Plan Note (Signed)
Continued CPAP use now for the last 2-1/2 months, coincidental improvement in the response to management of diabetic macular edema with antivegF injections, as nighttime hypoxia has diminished on CPAP  I encouraged the ongoing effective use of those measures by what ever means that nighttime hypoxia

## 2020-06-04 NOTE — Progress Notes (Signed)
06/04/2020     CHIEF COMPLAINT Patient presents for Retina Follow Up   HISTORY OF PRESENT ILLNESS: Grace [name] is a 69 y.o. female who presents to the clinic today for:   HPI    Retina Follow Up    Patient presents with  Diabetic Retinopathy.  In right eye.  This started 6 weeks ago.  Severity is mild.  Duration of 6 weeks.  Since onset it is stable.          Comments    6 Week Diabetic F/U OD, poss Eylea OD  On CPAP for the last 6 weeks effectively for 4 hours per night, due to eyelid swelling  Patient reports she is going to be asking Dr. Brett Fairy for "nasal pillars"   Pt denies noticeable changes to [place] OU since last visit. Pt denies ocular pain, flashes of light, or floaters OU.  LBS: 154 2-3 days ago A1c: 6.9, 04/2020       Last edited by Hurman Horn, MD on 06/04/2020  4:35 PM. (History)      Referring physician: Unk Pinto, MD [address] [place],  [place] [id number]  HISTORICAL INFORMATION:   Selected notes from the MEDICAL RECORD NUMBER    Lab Results  Component Value Date   HGBA1C 6.9 (H) 05/23/2020     CURRENT MEDICATIONS: No current outpatient medications on file. (Ophthalmic Drugs)   No current facility-administered medications for this visit. (Ophthalmic Drugs)   Current Outpatient Medications (Other)  Medication Sig  . Ascorbic Acid (VITAMIN C PO) Take 1 tablet by mouth daily.  . Calcium Citrate-Vitamin D (CITRACAL/VITAMIN D PO) Take 1 tablet by mouth daily.  . Cholecalciferol (VITAMIN D PO) Take 1,000 Units by mouth 2 (two) times daily. Reported on 02/07/2016  . DULoxetine (CYMBALTA) 30 MG capsule Take 1 capsule (30 mg total) by mouth daily.  Marland Kitchen glimepiride (AMARYL) 2 MG tablet Take     1 tablet    2 x /day with Meals     For Diabetes  . glucose blood (ACCU-CHEK GUIDE) test strip Check blood sugar 3 times a day-DX-E11.69 AND E78.5.  . Magnesium 250 MG TABS Take 2 tablets by mouth daily.   . metFORMIN  (GLUCOPHAGE-XR) 500 MG 24 hr tablet TAKE 2 TABLETS 2 X /DAY WITH MEALS FOR DIABETES  . Omega-3 Fatty Acids (OMEGA-3 FISH OIL PO) Take 1 capsule by mouth daily.  Marland Kitchen zinc gluconate 50 MG tablet Take 50 mg by mouth daily.   No current facility-administered medications for this visit. (Other)      REVIEW OF SYSTEMS:    ALLERGIES Allergies  Allergen Reactions  . Gabapentin Swelling    Swelling in legs and feet    PAST MEDICAL HISTORY Past Medical History:  Diagnosis Date  . Abnormality of gait 07/28/2013  . Diabetes ([place])   . Diabetic peripheral neuropathy ([place])   . Diabetic retinopathy ([place])   . Foot drop, bilateral 07/31/2014  . Peripheral edema   . Polyneuropathy in diabetes(357.2) 07/28/2013   Past Surgical History:  Procedure Laterality Date  . ABDOMINAL HYSTERECTOMY    . ANKLE FRACTURE SURGERY     left  . CATARACT EXTRACTION W/PHACO Right 02/21/2014   Dr. Herbert Deaner  . CATARACT EXTRACTION W/PHACO Left 04/10/2014   Dr. Herbert Deaner  . CATARACT EXTRACTION, BILATERAL    . HIP SURGERY     right  . TONSILLECTOMY      FAMILY HISTORY Family History  Problem Relation Age of  Onset  . Diabetes Mother   . Diabetes Brother   . Neuropathy Neg Hx     SOCIAL HISTORY Social History   Tobacco Use  . Smoking status: Never Smoker  . Smokeless tobacco: Never Used  Substance Use Topics  . Alcohol use: No  . Drug use: No         OPHTHALMIC EXAM: Base Eye Exam    Visual Acuity (ETDRS)      Right Left   Dist [place] 20/40 +1 20/60 +2   Dist ph [place] 20/30 -2 20/40 -2       Tonometry (Tonopen, 4:01 PM)      Right Left   Pressure 19 14       Tonometry #2 (Tonopen, 4:08 PM)      Right Left   Pressure 19        Pupils      Pupils Dark Light Shape React APD   Right PERRL 3 3 Round Minimal None   Left PERRL 3 3 Round Minimal None       Visual Fields (Counting fingers)      Left Right   Restrictions Total superior nasal deficiency Partial outer inferior temporal  deficiency       Extraocular Movement      Right Left    Full Full       Neuro/Psych    Oriented x3: Yes   Mood/Affect: Normal       Dilation    Right eye: 1.0% Mydriacyl, 2.5% Phenylephrine @ 4:08 PM        Slit Lamp and Fundus Exam    External Exam      Right Left   External Normal Normal       Slit Lamp Exam      Right Left   Lids/Lashes Normal Normal   Conjunctiva/Sclera White and quiet White and quiet   Cornea Clear Clear   Anterior Chamber Deep and quiet Deep and quiet   Iris Round and reactive Round and reactive   Lens Posterior chamber intraocular lens Posterior chamber intraocular lens   Anterior Vitreous Normal Normal       Fundus Exam      Right Left   Posterior Vitreous Normal    Disc Normal    C/D Ratio 0.2    Macula Epiretinal membrane, good focal laser, Macular thickening    Vessels PDR-quiet    Periphery good PRP,, ATTACHED           IMAGING AND PROCEDURES  Imaging and Procedures for 06/04/20  OCT, Retina - OU - Both Eyes       Right Eye Quality was good. Scan locations included subfoveal. Central Foveal Thickness: 280. Findings include epiretinal membrane.   Left Eye Quality was good. Scan locations included subfoveal. Central Foveal Thickness: 320. Findings include cystoid macular edema.   Notes OD, vastly improved macular anatomy, now coincident with recent injection Eylea but also on 10 weeks of CPAP use regularly  OS also improved in the last 3 weeks, post intravitreal Eylea                ASSESSMENT/PLAN:  Diabetic macular edema of right eye with proliferative retinopathy associated with type 2 diabetes mellitus ([place]) The nature of regressed proliferative diabetic retinopathy was discussed with the patient. The patient was advised to maintain good glucose, blood pressure, monitor kidney function and serum lipid control as advised by personal physician. Rare risk for reactivation of progression exist with untreated  severe anemia, untreated renal failure, untreated heart failure, and smoking. Complete avoidance of smoking was recommended. The chance of recurrent proliferative diabetic retinopathy was discussed as well as the chance of vitreous hemorrhage for which further treatments may be necessary.   Explained to the patient that the quiescent  proliferative diabetic retinopathy disease is unlikely to ever worsen.  Worsening factors would include however severe anemia, hypertension out-of-control or impending renal failure.  Much less since patient has been on CPAP for the last 10 weeks in conjunction with these injections CSME OD now improved        ICD-10-CM   1. Diabetic macular edema of right eye with proliferative retinopathy associated with type 2 diabetes mellitus (HCC)  E11.3511 OCT, Retina - OU - Both Eyes    1.  Repeat injection intravitreal Eylea today at 6-week interval, much less CSME, today  in conjunction with ongoing CPAP use  2.  Repeat examination left eye as scheduled 3.  Ophthalmic Meds Ordered this visit:  No orders of the defined types were placed in this encounter.      Return in about 7 weeks (around 07/23/2020) for dilate, OD, EYLEA OCT.  There are no Patient Instructions on file for this visit.   Explained the diagnoses, plan, and follow up with the patient and they expressed understanding.  Patient expressed understanding of the importance of proper follow up care.   Clent Demark Vivek Grealish M.D. Diseases & Surgery of the Retina and Vitreous Retina & Diabetic [place] 06/04/20     Abbreviations: M myopia (nearsighted); A astigmatism; H hyperopia (farsighted); P presbyopia; Mrx spectacle prescription;  CTL contact lenses; OD right eye; OS left eye; OU both eyes  XT exotropia; ET esotropia; PEK punctate epithelial keratitis; PEE punctate epithelial erosions; DES dry eye syndrome; MGD meibomian gland dysfunction; ATs artificial tears; PFAT's preservative free artificial  tears; [place] nuclear sclerotic cataract; PSC posterior subcapsular cataract; ERM epi-retinal membrane; PVD posterior vitreous detachment; RD retinal detachment; DM diabetes mellitus; DR diabetic retinopathy; NPDR non-proliferative diabetic retinopathy; PDR proliferative diabetic retinopathy; CSME clinically significant macular edema; DME diabetic macular edema; dbh dot blot hemorrhages; CWS cotton wool spot; POAG primary open angle glaucoma; C/D cup-to-disc ratio; HVF humphrey visual field; GVF goldmann visual field; OCT optical coherence tomography; IOP intraocular pressure; BRVO Branch retinal vein occlusion; CRVO central retinal vein occlusion; CRAO central retinal artery occlusion; BRAO branch retinal artery occlusion; RT retinal tear; SB scleral buckle; PPV pars plana vitrectomy; VH Vitreous hemorrhage; PRP panretinal laser photocoagulation; IVK intravitreal kenalog; VMT vitreomacular traction; MH Macular hole;  NVD neovascularization of the disc; NVE neovascularization elsewhere; AREDS age related eye disease study; ARMD age related macular degeneration; POAG primary open angle glaucoma; EBMD epithelial/anterior basement membrane dystrophy; ACIOL anterior chamber intraocular lens; IOL intraocular lens; PCIOL posterior chamber intraocular lens; Phaco/IOL phacoemulsification with intraocular lens placement; [place] photorefractive keratectomy; LASIK laser assisted in situ keratomileusis; HTN hypertension; DM diabetes mellitus; COPD chronic obstructive pulmonary disease

## 2020-06-05 ENCOUNTER — Ambulatory Visit (INDEPENDENT_AMBULATORY_CARE_PROVIDER_SITE_OTHER): Payer: Medicare Other | Admitting: Family Medicine

## 2020-06-05 ENCOUNTER — Encounter: Payer: Self-pay | Admitting: Family Medicine

## 2020-06-05 VITALS — BP 134/61 | HR 72 | Ht 64.0 in | Wt 176.0 lb

## 2020-06-05 DIAGNOSIS — Z9989 Dependence on other enabling machines and devices: Secondary | ICD-10-CM | POA: Diagnosis not present

## 2020-06-05 DIAGNOSIS — G4733 Obstructive sleep apnea (adult) (pediatric): Secondary | ICD-10-CM

## 2020-06-05 NOTE — Progress Notes (Signed)
Order for mask refitting & supplies sent to [facility] via community message. Confirmation received that the order transmitted was successful.

## 2020-06-05 NOTE — Patient Instructions (Addendum)
Please continue using your CPAP regularly. While your insurance requires that you use CPAP at least 4 hours each night on 70% of the nights, I recommend, that you not skip any nights and use it throughout the night if you can. Getting used to CPAP and staying with the treatment long term does take time and patience and discipline. Untreated obstructive sleep apnea when it is moderate to severe can have an adverse impact on cardiovascular health and raise her risk for heart disease, arrhythmias, hypertension, congestive heart failure, stroke and diabetes. Untreated obstructive sleep apnea causes sleep disruption, nonrestorative sleep, and sleep deprivation. This can have an impact on your day to day functioning and cause daytime sleepiness and impairment of cognitive function, memory loss, mood disturbance, and problems focussing. Using CPAP regularly can improve these symptoms.  We will send orders for a mask refitting. Let us know if you need anything else!  Follow up in 01/2021 with Sarah  Sleep Apnea Sleep apnea affects breathing during sleep. It causes breathing to stop for a short time or to become shallow. It can also increase the risk of:  Heart attack.  Stroke.  Being very overweight (obese).  Diabetes.  Heart failure.  Irregular heartbeat. The goal of treatment is to help you breathe normally again. What are the causes? There are three kinds of sleep apnea:  Obstructive sleep apnea. This is caused by a blocked or collapsed airway.  Central sleep apnea. This happens when the brain does not send the right signals to the muscles that control breathing.  Mixed sleep apnea. This is a combination of obstructive and central sleep apnea. The most common cause of this condition is a collapsed or blocked airway. This can happen if:  Your throat muscles are too relaxed.  Your tongue and tonsils are too large.  You are overweight.  Your airway is too small. What increases the  risk?  Being overweight.  Smoking.  Having a small airway.  Being older.  Being female.  Drinking alcohol.  Taking medicines to calm yourself (sedatives or tranquilizers).  Having family members with the condition. What are the signs or symptoms?  Trouble staying asleep.  Being sleepy or tired during the day.  Getting angry a lot.  Loud snoring.  Headaches in the morning.  Not being able to focus your mind (concentrate).  Forgetting things.  Less interest in sex.  Mood swings.  Personality changes.  Feelings of sadness (depression).  Waking up a lot during the night to pee (urinate).  Dry mouth.  Sore throat. How is this diagnosed?  Your medical history.  A physical exam.  A test that is done when you are sleeping (sleep study). The test is most often done in a sleep lab but may also be done at home. How is this treated?   Sleeping on your side.  Using a medicine to get rid of mucus in your nose (decongestant).  Avoiding the use of alcohol, medicines to help you relax, or certain pain medicines (narcotics).  Losing weight, if needed.  Changing your diet.  Not smoking.  Using a machine to open your airway while you sleep, such as: ? An oral appliance. This is a mouthpiece that shifts your lower jaw forward. ? A CPAP device. This device blows air through a mask when you breathe out (exhale). ? An EPAP device. This has valves that you put in each nostril. ? A BPAP device. This device blows air through a mask when you breathe  in (inhale) and breathe out.  Having surgery if other treatments do not work. It is important to get treatment for sleep apnea. Without treatment, it can lead to:  High blood pressure.  Coronary artery disease.  In men, not being able to have an erection (impotence).  Reduced thinking ability. Follow these instructions at home: Lifestyle  Make changes that your doctor recommends.  Eat a healthy diet.  Lose  weight if needed.  Avoid alcohol, medicines to help you relax, and some pain medicines.  Do not use any products that contain nicotine or tobacco, such as cigarettes, e-cigarettes, and chewing tobacco. If you need help quitting, ask your doctor. General instructions  Take over-the-counter and prescription medicines only as told by your doctor.  If you were given a machine to use while you sleep, use it only as told by your doctor.  If you are having surgery, make sure to tell your doctor you have sleep apnea. You may need to bring your device with you.  Keep all follow-up visits as told by your doctor. This is important. Contact a doctor if:  The machine that you were given to use during sleep bothers you or does not seem to be working.  You do not get better.  You get worse. Get help right away if:  Your chest hurts.  You have trouble breathing in enough air.  You have an uncomfortable feeling in your back, arms, or stomach.  You have trouble talking.  One side of your body feels weak.  A part of your face is hanging down. These symptoms may be an emergency. Do not wait to see if the symptoms will go away. Get medical help right away. Call your local emergency services (911 in the [place] Do not drive yourself to the hospital. Summary  This condition affects breathing during sleep.  The most common cause is a collapsed or blocked airway.  The goal of treatment is to help you breathe normally while you sleep. This information is not intended to replace advice given to you by your health care provider. Make sure you discuss any questions you have with your health care provider. Document Revised: 05/21/2018 Document Reviewed: 03/30/2018 Elsevier Patient Education  [place].

## 2020-06-05 NOTE — Progress Notes (Signed)
PATIENT: Grace [name] DOB: [birth year]  REASON FOR VISIT: follow up HISTORY FROM: patient  Chief Complaint  Patient presents with  . Follow-up    [facility]  . Sleep Apnea    pt would like to try nasal pillows     HISTORY OF PRESENT ILLNESS: Today 06/05/20 Grace [name] is a 69 y.o. [name] here today for follow up for OSA on CPAP.  HST on 01/09/2020 revealed "mild sleep apnea at AHI of 8.9/h and REM AHI of 12.8/h". She is doing well on CPAP therapy. She does note improvement in sleep quality and energy levels. She reports that ophthalmology has reported improvement in DR as well. She is interested in trying a nasal pillow. She feels her facial structure is asymmetric and her full face mask does not sit right and sometimes uncomfortable.    Compliance report dated 05/05/2020 to 06/03/2020 reveals she used CPAP 30 of the past 30 days for compliance of 100%. She is CPAP greater than 4 hours all 30 days. Average usage was 5 hours and 3 minutes. Residual AHI was 2.9 on 6 to 15 cm of water and an EPR of 3. There was no significant leak noted.   HISTORY: (copied from Dr Dohmeier's note on 12/08/2019)  Grace [name] patient is seen here upon a referralon 12/08/2019 . Chiefconcernaccording to patient : I don't think I have Apnea , but my eye doctor is concerned that my diabetic eye disease is related to untreated OSA".   I have the pleasure of seeing Grace [name] on 12-08-2019,a right -handed Caucasian [name] with a possible sleep disorder.  She has a past medical history of  Diabetic peripheral neuropathy ([place]), Diabetic retinopathy ([place]), Foot drop, bilateral (07/31/2014), Peripheral edema, and is followed since 2014 by Dr. Jannifer Franklin.  She has had cataract and diabetic retinopathy has been diagnosed over 4 years ago, has worsened.   Sleeprelevant medical history: Nocturia every 2 hours- Tonsillectomy in  childhood. Familymedical /sleep history: brotheron CPAP with OSA.  Social history:Patient is working as a Network engineer at a non Therapist, music. She lives in a household with her spouse.   2 adult children,3 grandchildren.  Pets are not  present. Tobacco use- none. ETOH use; none , Caffeine intake in form of Coffee(3 / all day) Soda( none) Tea ( no longer). No energy drinks. No regular exercise- she has bilateral foot drop- neuropathy, vision restriction.    Sleep habits are as follows:The patient's dinner time is between 5 PM. The patient goes to bed at 10 PM and continues to wake up every 2 hours, using these beaks for a bathroom trip- nocturia doesn't wake her. Total sleep time per night is about 6 hours. The preferred sleep position is on either side-, with the support of one  pillows. Dreams are reportedly rare.  7 AM is the usual rise time.  The patient wakes up spontaneously at 7 AM.  She reports not feeling refreshed or restored in AM, with symptoms such as dry mouth, but no morning headaches. Naps are taken daily , lasting from 45-60 minutes and are more refreshing than nocturnal sleep.     REVIEW OF SYSTEMS: Out of a complete 14 system review of symptoms, the patient complains only of the following symptoms, none and all other reviewed systems are negative.  ESS:14 FSS: 44  ALLERGIES: Allergies  Allergen Reactions  . Gabapentin Swelling    Swelling in legs and feet  HOME MEDICATIONS: Outpatient Medications Prior to Visit  Medication Sig Dispense Refill  . Ascorbic Acid (VITAMIN C PO) Take 1 tablet by mouth daily.    . Calcium Citrate-Vitamin D (CITRACAL/VITAMIN D PO) Take 1 tablet by mouth daily.    . Cholecalciferol (VITAMIN D PO) Take 1,000 Units by mouth 2 (two) times daily. Reported on 02/07/2016    . DULoxetine (CYMBALTA) 30 MG capsule Take 1 capsule (30 mg total) by mouth daily. 90 capsule 3  . glimepiride (AMARYL) 2 MG tablet Take     1 tablet    2 x /day with  Meals     For Diabetes 180 tablet 0  . glucose blood (ACCU-CHEK GUIDE) test strip Check blood sugar 3 times a day-DX-E11.69 AND E78.5. 100 each 12  . Magnesium 250 MG TABS Take 2 tablets by mouth daily.     . metFORMIN (GLUCOPHAGE-XR) 500 MG 24 hr tablet TAKE 2 TABLETS 2 X /DAY WITH MEALS FOR DIABETES 360 tablet 0  . Omega-3 Fatty Acids (OMEGA-3 FISH OIL PO) Take 1 capsule by mouth daily.    Marland Kitchen zinc gluconate 50 MG tablet Take 50 mg by mouth daily.     No facility-administered medications prior to visit.    PAST MEDICAL HISTORY: Past Medical History:  Diagnosis Date  . Abnormality of gait 07/28/2013  . Diabetes ([place])   . Diabetic peripheral neuropathy ([place])   . Diabetic retinopathy ([place])   . Foot drop, bilateral 07/31/2014  . Peripheral edema   . Polyneuropathy in diabetes(357.2) 07/28/2013    PAST SURGICAL HISTORY: Past Surgical History:  Procedure Laterality Date  . ABDOMINAL HYSTERECTOMY    . ANKLE FRACTURE SURGERY     left  . CATARACT EXTRACTION W/PHACO Right 02/21/2014   Dr. Herbert Deaner  . CATARACT EXTRACTION W/PHACO Left 04/10/2014   Dr. Herbert Deaner  . CATARACT EXTRACTION, BILATERAL    . HIP SURGERY     right  . TONSILLECTOMY      FAMILY HISTORY: Family History  Problem Relation Age of Onset  . Diabetes Mother   . Diabetes Brother   . Neuropathy Neg Hx     SOCIAL HISTORY: Social History   Socioeconomic History  . Marital status: Married    Spouse name: Not on file  . Number of children: 2  . Years of education: college  . Highest education level: Not on file  Occupational History  . Occupation: part time  Tobacco Use  . Smoking status: Never Smoker  . Smokeless tobacco: Never Used  Substance and Sexual Activity  . Alcohol use: No  . Drug use: No  . Sexual activity: Not on file  Other Topics Concern  . Not on file  Social History Narrative   Patient is right handed.   Patient drinks about 4 cups of tea daily.   Social Determinants of Health    Financial Resource Strain:   . Difficulty of Paying Living Expenses: Not on file  Food Insecurity:   . Worried About Charity fundraiser in the Last Year: Not on file  . Ran Out of Food in the Last Year: Not on file  Transportation Needs:   . Lack of Transportation (Medical): Not on file  . Lack of Transportation (Non-Medical): Not on file  Physical Activity:   . Days of Exercise per Week: Not on file  . Minutes of Exercise per Session: Not on file  Stress:   . Feeling of Stress : Not on file  Social Connections:   .  Frequency of Communication with Friends and Family: Not on file  . Frequency of Social Gatherings with Friends and Family: Not on file  . Attends Religious Services: Not on file  . Active Member of Clubs or Organizations: Not on file  . Attends Archivist Meetings: Not on file  . Marital Status: Not on file  Intimate Partner Violence:   . Fear of Current or Ex-Partner: Not on file  . Emotionally Abused: Not on file  . Physically Abused: Not on file  . Sexually Abused: Not on file     PHYSICAL EXAM  Vitals:   06/05/20 0945  BP: 134/61  Pulse: 72  Weight: 176 lb (79.8 kg)  Height: 5\' 4"  (1.626 m)   Body mass index is 30.21 kg/m.  Generalized: Well developed, in no acute distress  Cardiology: normal rate and rhythm, no murmur noted Respiratory: clear to auscultation bilaterally  Neurological examination  Mentation: Alert oriented to time, place, history taking. Follows all commands speech and language fluent Cranial nerve II-XII: Pupils were equal round reactive to light. Extraocular movements were full, visual field were full  Motor: The motor testing reveals 5 over 5 strength of all 4 extremities. Good symmetric motor tone is noted throughout.  Gait and station: Gait is stable with walker.    DIAGNOSTIC DATA (LABS, IMAGING, TESTING) - I reviewed patient records, labs, notes, testing and imaging myself where available.  No flowsheet data  found.   Lab Results  Component Value Date   WBC 5.8 05/23/2020   HGB 11.8 05/23/2020   HCT 38.0 05/23/2020   MCV 94.3 05/23/2020   PLT 397 05/23/2020      Component Value Date/Time   NA 142 05/23/2020 1102   K 4.7 05/23/2020 1102   CL 103 05/23/2020 1102   CO2 33 (H) 05/23/2020 1102   GLUCOSE 159 (H) 05/23/2020 1102   BUN 20 05/23/2020 1102   CREATININE 1.08 (H) 05/23/2020 1102   CALCIUM 9.8 05/23/2020 1102   CALCIUM 9.8 05/23/2020 1102   PROT 5.9 (L) 05/23/2020 1102   ALBUMIN 3.8 01/27/2017 1609   AST 14 05/23/2020 1102   ALT 18 05/23/2020 1102   ALKPHOS 69 01/27/2017 1609   BILITOT 0.3 05/23/2020 1102   GFRNONAA 52 (L) 05/23/2020 1102   GFRAA 60 05/23/2020 1102   Lab Results  Component Value Date   CHOL 185 05/23/2020   HDL 54 05/23/2020   LDLCALC 100 (H) 05/23/2020   TRIG 191 (H) 05/23/2020   CHOLHDL 3.4 05/23/2020   Lab Results  Component Value Date   HGBA1C 6.9 (H) 05/23/2020   No results found for: VITAMINB12 Lab Results  Component Value Date   TSH 2.01 05/23/2020     ASSESSMENT AND PLAN 69 y.o. year old [name]  has a past medical history of Abnormality of gait (07/28/2013), Diabetes ([place]), Diabetic peripheral neuropathy ([place]), Diabetic retinopathy ([place]), Foot drop, bilateral (07/31/2014), Peripheral edema, and Polyneuropathy in diabetes(357.2) (07/28/2013). here with     ICD-10-CM   1. OSA on CPAP  G47.33 For home use only DME continuous positive airway pressure (CPAP)   Z99.89      Skylie Hiott is doing well on CPAP therapy. Compliance report reveals excellent compliance. She was encouraged to continue using CPAP nightly and for greater than 4 hours each night. We will update supply orders as indicated. I will also send orders for a mask refitting. Risks of untreated sleep apnea review and education materials provided. Healthy lifestyle habits encouraged.  She will follow up as scheduled with Sarah in 01/2021, sooner if needed. She verbalizes  understanding and agreement with this plan.   Orders Placed This Encounter  Procedures  . For home use only DME continuous positive airway pressure (CPAP)    Mask refitting and Supplies please    Order Specific Question:   Length of Need    Answer:   Lifetime    Order Specific Question:   Patient has OSA or probable OSA    Answer:   Yes    Order Specific Question:   Is the patient currently using CPAP in the home    Answer:   Yes    Order Specific Question:   Settings    Answer:   Other see comments    Order Specific Question:   CPAP supplies needed    Answer:   Mask, headgear, cushions, filters, heated tubing and water chamber     No orders of the defined types were placed in this encounter.     I spent 15 minutes with the patient. 50% of this time was spent counseling and educating patient on plan of care and medications.    Debbora Presto, FNP-C 06/05/2020, 10:25 AM [facility] [address] [place], [place] [address] [phone]

## 2020-06-12 ENCOUNTER — Encounter (INDEPENDENT_AMBULATORY_CARE_PROVIDER_SITE_OTHER): Payer: Medicare Other | Admitting: Ophthalmology

## 2020-06-15 DIAGNOSIS — Z23 Encounter for immunization: Secondary | ICD-10-CM | POA: Diagnosis not present

## 2020-07-02 ENCOUNTER — Other Ambulatory Visit: Payer: Self-pay

## 2020-07-02 ENCOUNTER — Ambulatory Visit (INDEPENDENT_AMBULATORY_CARE_PROVIDER_SITE_OTHER): Payer: Medicare Other | Admitting: Ophthalmology

## 2020-07-02 ENCOUNTER — Encounter (INDEPENDENT_AMBULATORY_CARE_PROVIDER_SITE_OTHER): Payer: Self-pay | Admitting: Ophthalmology

## 2020-07-02 DIAGNOSIS — E113512 Type 2 diabetes mellitus with proliferative diabetic retinopathy with macular edema, left eye: Secondary | ICD-10-CM

## 2020-07-02 DIAGNOSIS — G4733 Obstructive sleep apnea (adult) (pediatric): Secondary | ICD-10-CM

## 2020-07-02 DIAGNOSIS — Z9989 Dependence on other enabling machines and devices: Secondary | ICD-10-CM | POA: Diagnosis not present

## 2020-07-02 DIAGNOSIS — E113511 Type 2 diabetes mellitus with proliferative diabetic retinopathy with macular edema, right eye: Secondary | ICD-10-CM | POA: Diagnosis not present

## 2020-07-02 MED ORDER — AFLIBERCEPT 2MG/0.05ML IZ SOLN FOR KALEIDOSCOPE
2.0000 mg | INTRAVITREAL | Status: AC | PRN
  Administered 2020-07-02: 2 mg via INTRAVITREAL

## 2020-07-02 NOTE — Assessment & Plan Note (Addendum)
OS with chronic cystoid macular edema Currently on 8-week follow-up interval.  Stable since last examination 05/08/2020 yet much improved compared to 03/13/2020  Left eye with chronic cystoid change from [facility] yet stable at 8-week interval.  Much improved as compared to July 2021 however, repeat injection intravitreal Eylea OS today and examination OS in 8 weeks

## 2020-07-02 NOTE — Assessment & Plan Note (Signed)
[facility] OD, improved now and resolved since 04-26-20.  Injection on that date and subsequently OD on 06/04/2020  OD currently on schedule  6-week interval

## 2020-07-02 NOTE — Progress Notes (Signed)
07/02/2020     CHIEF COMPLAINT Patient presents for Retina Follow Up   HISTORY OF PRESENT ILLNESS: Grace [name] is a 69 y.o. female who presents to the clinic today for:   HPI    Retina Follow Up    Patient presents with  Diabetic Retinopathy.  In left eye.  Severity is moderate.  Since onset it is stable.  I, the attending physician,  performed the HPI with the patient and updated documentation appropriately.          Comments    8 Week PDR f\u OS. Possible Eylea OS. OCT  Pt states no changes in vision. Denies any new complaints. BGL: did not check A1C: 6.9       Last edited by Tilda Franco on 07/02/2020  3:35 PM. (History)      Referring physician: Unk Pinto, MD [address] [place],  [place] [address]  HISTORICAL INFORMATION:   Selected notes from the MEDICAL RECORD NUMBER    Lab Results  Component Value Date   HGBA1C 6.9 (H) 05/23/2020     CURRENT MEDICATIONS: No current outpatient medications on file. (Ophthalmic Drugs)   No current facility-administered medications for this visit. (Ophthalmic Drugs)   Current Outpatient Medications (Other)  Medication Sig  . Ascorbic Acid (VITAMIN C PO) Take 1 tablet by mouth daily.  . Calcium Citrate-Vitamin D (CITRACAL/VITAMIN D PO) Take 1 tablet by mouth daily.  . Cholecalciferol (VITAMIN D PO) Take 1,000 Units by mouth 2 (two) times daily. Reported on 02/07/2016  . DULoxetine (CYMBALTA) 30 MG capsule Take 1 capsule (30 mg total) by mouth daily.  Marland Kitchen glimepiride (AMARYL) 2 MG tablet Take     1 tablet    2 x /day with Meals     For Diabetes  . glucose blood (ACCU-CHEK GUIDE) test strip Check blood sugar 3 times a day-DX-E11.69 AND E78.5.  . Magnesium 250 MG TABS Take 2 tablets by mouth daily.   . metFORMIN (GLUCOPHAGE-XR) 500 MG 24 hr tablet TAKE 2 TABLETS 2 X /DAY WITH MEALS FOR DIABETES  . Omega-3 Fatty Acids (OMEGA-3 FISH OIL PO) Take 1 capsule by mouth daily.  Marland Kitchen zinc gluconate  50 MG tablet Take 50 mg by mouth daily.   No current facility-administered medications for this visit. (Other)      REVIEW OF SYSTEMS: ROS    Positive for: Endocrine   Last edited by Tilda Franco on 07/02/2020  3:35 PM. (History)       ALLERGIES Allergies  Allergen Reactions  . Gabapentin Swelling    Swelling in legs and feet    PAST MEDICAL HISTORY Past Medical History:  Diagnosis Date  . Abnormality of gait 07/28/2013  . Diabetes ([place])   . Diabetic peripheral neuropathy ([place])   . Diabetic retinopathy ([place])   . Foot drop, bilateral 07/31/2014  . Peripheral edema   . Polyneuropathy in diabetes(357.2) 07/28/2013   Past Surgical History:  Procedure Laterality Date  . ABDOMINAL HYSTERECTOMY    . ANKLE FRACTURE SURGERY     left  . CATARACT EXTRACTION W/PHACO Right 02/21/2014   Dr. Herbert Deaner  . CATARACT EXTRACTION W/PHACO Left 04/10/2014   Dr. Herbert Deaner  . CATARACT EXTRACTION, BILATERAL    . HIP SURGERY     right  . TONSILLECTOMY      FAMILY HISTORY Family History  Problem Relation Age of Onset  . Diabetes Mother   . Diabetes Brother   . Neuropathy Neg Hx  SOCIAL HISTORY Social History   Tobacco Use  . Smoking status: Never Smoker  . Smokeless tobacco: Never Used  Substance Use Topics  . Alcohol use: No  . Drug use: No         OPHTHALMIC EXAM:  Base Eye Exam    Visual Acuity (Snellen - Linear)      Right Left   Dist [place] 20/40 20/80 -2   Dist ph [place] NI NI       Tonometry (Tonopen, 3:39 PM)      Right Left   Pressure 18 18       Pupils      Pupils Dark Light Shape React APD   Right PERRL 3 3 Round Minimal None   Left PERRL 3 3 Round Minimal None       Visual Fields (Counting fingers)      Left Right   Restrictions Partial outer superior temporal deficiency        Neuro/Psych    Oriented x3: Yes   Mood/Affect: Normal       Dilation    Left eye: 1.0% Mydriacyl, 2.5% Phenylephrine @ 3:39 PM        Slit Lamp and Fundus  Exam    External Exam      Right Left   External Normal Normal       Slit Lamp Exam      Right Left   Lids/Lashes Normal Normal   Conjunctiva/Sclera White and quiet White and quiet   Cornea Clear Clear   Anterior Chamber Deep and quiet Deep and quiet   Iris Round and reactive Round and reactive   Lens Posterior chamber intraocular lens Posterior chamber intraocular lens   Anterior Vitreous Normal Normal       Fundus Exam      Right Left   Posterior Vitreous  Posterior vitreous detachment   Disc  Normal   C/D Ratio  0.2-0.3   Macula  Microaneurysms, Macular thickening, Clinically significant macular edema, Mild clinically significant macular edema   Vessels  Severe nonproliferative diabetic retinopathy with retinal nonperfusion   Periphery  Good peripheral PRP          IMAGING AND PROCEDURES  Imaging and Procedures for 07/02/20  OCT, Retina - OU - Both Eyes       Right Eye Quality was good. Scan locations included subfoveal. Central Foveal Thickness: 283. Progression has improved. Findings include epiretinal membrane.   Left Eye Quality was good. Scan locations included subfoveal. Central Foveal Thickness: 394. Progression has improved. Findings include abnormal foveal contour.   Notes No residual center involved CME of CSME OD now 1 month status post recent injection.  Patient scheduled for follow-up right eye in 3 weeks  Left eye with chronic cystoid change from CSME yet stable at 8-week interval.  Much improved as compared to July 2021 however, repeat injection intravitreal Eylea OS today and examination OS in 8 weeks       Intravitreal Injection, Pharmacologic Agent - OS - Left Eye       Time Out 07/02/2020. 4:03 PM. Confirmed correct patient, procedure, site, and patient consented.   Anesthesia Topical anesthesia was used. Anesthetic medications included Akten 3.5%.   Procedure Preparation included Tobramycin 0.3%, 5% betadine to ocular surface, 10%  betadine to eyelids. A 30 gauge needle was used.   Injection:  2 mg aflibercept [name]) SOLN   NDC: 16109-604-54, Lot: [id number]   Route: Intravitreal, Site: Left Eye, Waste: 0 mg  Post-op  Post injection exam found visual acuity of at least counting fingers. The patient tolerated the procedure well. There were no complications. The patient received written and verbal post procedure care education. Post injection medications were not given.                 ASSESSMENT/PLAN:  Diabetic macular edema of right eye with proliferative retinopathy associated with type 2 diabetes mellitus ([place]) CSME OD, improved now and resolved since 04-26-20.  Injection on that date and subsequently OD on 06/04/2020  OD currently on schedule  6-week interval  Proliferative diabetic retinopathy of left eye with macular edema associated with type 2 diabetes mellitus (HCC) OS with chronic cystoid macular edema Currently on 8-week follow-up interval.  Stable since last examination 05/08/2020 yet much improved compared to 03/13/2020  Left eye with chronic cystoid change from CSME yet stable at 8-week interval.  Much improved as compared to July 2021 however, repeat injection intravitreal Eylea OS today and examination OS in 8 weeks      ICD-10-CM   1. Proliferative diabetic retinopathy of left eye with macular edema associated with type 2 diabetes mellitus (HCC)  A35.5732 OCT, Retina - OU - Both Eyes    Intravitreal Injection, Pharmacologic Agent - OS - Left Eye    aflibercept (EYLEA) SOLN 2 mg  2. OSA on CPAP  G47.33    Z99.89   3. Diabetic macular edema of right eye with proliferative retinopathy associated with type 2 diabetes mellitus ([place])  E11.3511     1.  Overall CSME continues to improve.  Less currently at 8-week follow-up, with chronic cystoid change and stable acuity.  We will repeat injection Eylea OS today.  2.  Upper OD as scheduled in 2 to 3 weeks possible Eylea  3.  Patient continues on  nasal pillars form of CPAP, with approximately 4 hours of continuous sleep is her current maximum due to nocturia.  Will be interesting to see if we might be able to lengthen the duration of follow-up as compliance with CPAP, nasal pillars, might enhance oxygenation particularly in the right eye and allowed increased interval of examinations particularly right eye    Ophthalmic Meds Ordered this visit:  Meds ordered this encounter  Medications  . aflibercept (EYLEA) SOLN 2 mg       Return in about 8 weeks (around 08/27/2020) for dilate, OS, EYLEA OCT.  There are no Patient Instructions on file for this visit.   Explained the diagnoses, plan, and follow up with the patient and they expressed understanding.  Patient expressed understanding of the importance of proper follow up care.   Clent Demark Tanijah Morais M.D. Diseases & Surgery of the Retina and Vitreous Retina & Diabetic [place] 07/02/20     Abbreviations: M myopia (nearsighted); A astigmatism; H hyperopia (farsighted); P presbyopia; Mrx spectacle prescription;  CTL contact lenses; OD right eye; OS left eye; OU both eyes  XT exotropia; ET esotropia; PEK punctate epithelial keratitis; PEE punctate epithelial erosions; DES dry eye syndrome; MGD meibomian gland dysfunction; ATs artificial tears; PFAT's preservative free artificial tears; [place] nuclear sclerotic cataract; PSC posterior subcapsular cataract; ERM epi-retinal membrane; PVD posterior vitreous detachment; RD retinal detachment; DM diabetes mellitus; DR diabetic retinopathy; NPDR non-proliferative diabetic retinopathy; PDR proliferative diabetic retinopathy; CSME clinically significant macular edema; DME diabetic macular edema; dbh dot blot hemorrhages; CWS cotton wool spot; POAG primary open angle glaucoma; C/D cup-to-disc ratio; HVF humphrey visual field; GVF goldmann visual field; OCT optical coherence tomography; IOP intraocular  pressure; BRVO Branch retinal vein occlusion; CRVO  central retinal vein occlusion; CRAO central retinal artery occlusion; BRAO branch retinal artery occlusion; RT retinal tear; SB scleral buckle; PPV pars plana vitrectomy; VH Vitreous hemorrhage; PRP panretinal laser photocoagulation; IVK intravitreal kenalog; VMT vitreomacular traction; MH Macular hole;  NVD neovascularization of the disc; NVE neovascularization elsewhere; AREDS age related eye disease study; ARMD age related macular degeneration; POAG primary open angle glaucoma; EBMD epithelial/anterior basement membrane dystrophy; ACIOL anterior chamber intraocular lens; IOL intraocular lens; PCIOL posterior chamber intraocular lens; Phaco/IOL phacoemulsification with intraocular lens placement; [place] photorefractive keratectomy; LASIK laser assisted in situ keratomileusis; HTN hypertension; DM diabetes mellitus; COPD chronic obstructive pulmonary disease

## 2020-07-23 ENCOUNTER — Ambulatory Visit (INDEPENDENT_AMBULATORY_CARE_PROVIDER_SITE_OTHER): Payer: Medicare Other | Admitting: Ophthalmology

## 2020-07-23 ENCOUNTER — Other Ambulatory Visit: Payer: Self-pay

## 2020-07-23 ENCOUNTER — Encounter (INDEPENDENT_AMBULATORY_CARE_PROVIDER_SITE_OTHER): Payer: Self-pay | Admitting: Ophthalmology

## 2020-07-23 DIAGNOSIS — E113511 Type 2 diabetes mellitus with proliferative diabetic retinopathy with macular edema, right eye: Secondary | ICD-10-CM | POA: Diagnosis not present

## 2020-07-23 MED ORDER — AFLIBERCEPT 2MG/0.05ML IZ SOLN FOR KALEIDOSCOPE
2.0000 mg | INTRAVITREAL | Status: AC | PRN
  Administered 2020-07-23: 2 mg via INTRAVITREAL

## 2020-07-23 NOTE — Progress Notes (Signed)
07/23/2020     CHIEF COMPLAINT Patient presents for Retina Follow Up   HISTORY OF PRESENT ILLNESS: Grace [name] is a 69 y.o. female who presents to the clinic today for:   HPI    Retina Follow Up    Patient presents with  Diabetic Retinopathy.  In right eye.  This started 7 weeks ago.  Severity is mild.  Duration of 7 weeks.  Since onset it is gradually worsening.          Comments    7 Week F/U OD, poss Eylea OD  Pt c/o blur OD x 1 week. Pt denies any other new symptoms OU. VA stable OS. LBS: 141 this AM       Last edited by Rockie Neighbours, [place] on 07/23/2020  3:44 PM. (History)      Referring physician: Unk Pinto, MD [address] [place],  [place] [address]  HISTORICAL INFORMATION:   Selected notes from the [facility]    Lab Results  Component Value Date   HGBA1C 6.9 (H) 05/23/2020     CURRENT MEDICATIONS: No current outpatient medications on file. (Ophthalmic Drugs)   No current facility-administered medications for this visit. (Ophthalmic Drugs)   Current Outpatient Medications (Other)  Medication Sig  . Ascorbic Acid (VITAMIN C PO) Take 1 tablet by mouth daily.  . Calcium Citrate-Vitamin D (CITRACAL/VITAMIN D PO) Take 1 tablet by mouth daily.  . Cholecalciferol (VITAMIN D PO) Take 1,000 Units by mouth 2 (two) times daily. Reported on 02/07/2016  . DULoxetine (CYMBALTA) 30 MG capsule Take 1 capsule (30 mg total) by mouth daily.  Marland Kitchen glimepiride (AMARYL) 2 MG tablet Take     1 tablet    2 x /day with Meals     For Diabetes  . glucose blood (ACCU-CHEK GUIDE) test strip Check blood sugar 3 times a day-DX-E11.69 AND E78.5.  . Magnesium 250 MG TABS Take 2 tablets by mouth daily.   . metFORMIN (GLUCOPHAGE-XR) 500 MG 24 hr tablet TAKE 2 TABLETS 2 X /DAY WITH MEALS FOR DIABETES  . Omega-3 Fatty Acids (OMEGA-3 FISH OIL PO) Take 1 capsule by mouth daily.  Marland Kitchen zinc gluconate 50 MG tablet Take 50 mg by mouth daily.   No current  facility-administered medications for this visit. (Other)      REVIEW OF SYSTEMS:    ALLERGIES Allergies  Allergen Reactions  . Gabapentin Swelling    Swelling in legs and feet    PAST MEDICAL HISTORY Past Medical History:  Diagnosis Date  . Abnormality of gait 07/28/2013  . Diabetes ([place])   . Diabetic peripheral neuropathy ([place])   . Diabetic retinopathy ([place])   . Foot drop, bilateral 07/31/2014  . Peripheral edema   . Polyneuropathy in diabetes(357.2) 07/28/2013   Past Surgical History:  Procedure Laterality Date  . ABDOMINAL HYSTERECTOMY    . ANKLE FRACTURE SURGERY     left  . CATARACT EXTRACTION W/PHACO Right 02/21/2014   Dr. Herbert Deaner  . CATARACT EXTRACTION W/PHACO Left 04/10/2014   Dr. Herbert Deaner  . CATARACT EXTRACTION, BILATERAL    . HIP SURGERY     right  . TONSILLECTOMY      FAMILY HISTORY Family History  Problem Relation Age of Onset  . Diabetes Mother   . Diabetes Brother   . Neuropathy Neg Hx     SOCIAL HISTORY Social History   Tobacco Use  . Smoking status: Never Smoker  . Smokeless tobacco: Never Used  Substance  Use Topics  . Alcohol use: No  . Drug use: No         OPHTHALMIC EXAM:  Base Eye Exam    Visual Acuity (ETDRS)      Right Left   Dist [place] 20/60 +2 20/80 -1   Dist ph [place] 20/30 -1 20/50 +1       Tonometry (Tonopen, 3:44 PM)      Right Left   Pressure 12 13       Pupils      Pupils Dark Light Shape React APD   Right PERRL 3 3 Round Minimal None   Left PERRL 3 3 Round Minimal None       Visual Fields (Counting fingers)      Left Right    Full Full       Extraocular Movement      Right Left    Full Full       Neuro/Psych    Oriented x3: Yes   Mood/Affect: Normal       Dilation    Right eye: 1.0% Mydriacyl, 2.5% Phenylephrine @ 3:49 PM        Slit Lamp and Fundus Exam    External Exam      Right Left   External Normal Normal       Slit Lamp Exam      Right Left   Lids/Lashes Normal Normal    Conjunctiva/Sclera White and quiet White and quiet   Cornea Clear Clear   Anterior Chamber Deep and quiet Deep and quiet   Iris Round and reactive Round and reactive   Lens Posterior chamber intraocular lens Posterior chamber intraocular lens   Anterior Vitreous Normal Normal       Fundus Exam      Right Left   Posterior Vitreous Normal    Disc Normal    C/D Ratio 0.25    Macula Epiretinal membrane, good focal laser, Macular thickening    Vessels PDR-quiet    Periphery good PRP,, ATTACHED           IMAGING AND PROCEDURES  Imaging and Procedures for 07/23/20  OCT, Retina - OU - Both Eyes       Right Eye Quality was good. Scan locations included subfoveal. Central Foveal Thickness: 291. Findings include epiretinal membrane.   Left Eye Quality was good. Scan locations included subfoveal. Central Foveal Thickness: 290.   Notes OS some 3 weeks after recent examination and treatment, much less CME temporal to the fovea in the region of typical MAC-TEL, watershed area.  OD, at 7-week interval today, center involved CSME remains improved at 7-week interval today.  This is on combination Eylea as well as more recent enhanced (5 hours continuous) CPAP usage at night  Repeat injection OD today and extend interval examination to 8 weeks       Intravitreal Injection, Pharmacologic Agent - OD - Right Eye       Time Out 07/23/2020. 4:28 PM. Confirmed correct patient, procedure, site, and patient consented.   Anesthesia Topical anesthesia was used. Anesthetic medications included Akten 3.5%.   Procedure Preparation included 10% betadine to eyelids, 5% betadine to ocular surface, Ofloxacin . A 30 gauge needle was used.   Injection:  2 mg aflibercept [name]) SOLN   NDC: [id number], Lot: [id number]   Route: Intravitreal, Site: Right Eye, Waste: 0 mg  Post-op Post injection exam found visual acuity of at least counting fingers. The patient tolerated the procedure well.  There were  no complications. The patient received written and verbal post procedure care education. Post injection medications were not given.                 ASSESSMENT/PLAN:  Diabetic macular edema of right eye with proliferative retinopathy associated with type 2 diabetes mellitus ([place]) I explained to the patient that at 7-week interval, the center macula now Systane dry with less CSME on intravitreal Eylea.  This may be coincident with ongoing nighttime treatment of hypoxemia with CPAP  Patient now up to 5 consecutive hours of use of CPAP and much more comfortable adjusting to its use (was 4 hours consecutively)      ICD-10-CM   1. Diabetic macular edema of right eye with proliferative retinopathy associated with type 2 diabetes mellitus (HCC)  E11.3511 OCT, Retina - OU - Both Eyes    Intravitreal Injection, Pharmacologic Agent - OD - Right Eye    aflibercept (EYLEA) SOLN 2 mg    1.  2.  3.  Ophthalmic Meds Ordered this visit:  Meds ordered this encounter  Medications  . aflibercept (EYLEA) SOLN 2 mg       Return in about 8 weeks (around 09/17/2020) for dilate, OD, EYLEA OCT.  There are no Patient Instructions on file for this visit.   Explained the diagnoses, plan, and follow up with the patient and they expressed understanding.  Patient expressed understanding of the importance of proper follow up care.   Clent Demark Baylie Drakes M.D. Diseases & Surgery of the Retina and Vitreous Retina & Diabetic [place] 07/23/20     Abbreviations: M myopia (nearsighted); A astigmatism; H hyperopia (farsighted); P presbyopia; Mrx spectacle prescription;  CTL contact lenses; OD right eye; OS left eye; OU both eyes  XT exotropia; ET esotropia; PEK punctate epithelial keratitis; PEE punctate epithelial erosions; DES dry eye syndrome; MGD meibomian gland dysfunction; ATs artificial tears; PFAT's preservative free artificial tears; [place] nuclear sclerotic cataract; PSC posterior  subcapsular cataract; ERM epi-retinal membrane; PVD posterior vitreous detachment; RD retinal detachment; DM diabetes mellitus; DR diabetic retinopathy; NPDR non-proliferative diabetic retinopathy; PDR proliferative diabetic retinopathy; CSME clinically significant macular edema; DME diabetic macular edema; dbh dot blot hemorrhages; CWS cotton wool spot; POAG primary open angle glaucoma; C/D cup-to-disc ratio; HVF humphrey visual field; GVF goldmann visual field; OCT optical coherence tomography; IOP intraocular pressure; BRVO Branch retinal vein occlusion; CRVO central retinal vein occlusion; CRAO central retinal artery occlusion; BRAO branch retinal artery occlusion; RT retinal tear; SB scleral buckle; PPV pars plana vitrectomy; VH Vitreous hemorrhage; PRP panretinal laser photocoagulation; IVK intravitreal kenalog; VMT vitreomacular traction; MH Macular hole;  NVD neovascularization of the disc; NVE neovascularization elsewhere; AREDS age related eye disease study; ARMD age related macular degeneration; POAG primary open angle glaucoma; EBMD epithelial/anterior basement membrane dystrophy; ACIOL anterior chamber intraocular lens; IOL intraocular lens; PCIOL posterior chamber intraocular lens; Phaco/IOL phacoemulsification with intraocular lens placement; [place] photorefractive keratectomy; LASIK laser assisted in situ keratomileusis; HTN hypertension; DM diabetes mellitus; COPD chronic obstructive pulmonary disease

## 2020-07-23 NOTE — Assessment & Plan Note (Addendum)
I explained to the patient that at 7-week interval, the center macula now Systane dry with less CSME on intravitreal Eylea.  This may be coincident with ongoing nighttime treatment of hypoxemia with CPAP  Patient now up to 5 consecutive hours of use of CPAP and much more comfortable adjusting to its use (was 4 hours consecutively)

## 2020-08-24 ENCOUNTER — Ambulatory Visit
Admission: RE | Admit: 2020-08-24 | Discharge: 2020-08-24 | Disposition: A | Discharge status: Home / Self Care | Payer: Medicare Other | Source: Ambulatory Visit | Attending: Internal Medicine | Admitting: Internal Medicine

## 2020-08-24 ENCOUNTER — Other Ambulatory Visit: Payer: Self-pay

## 2020-08-24 DIAGNOSIS — N6311 Unspecified lump in the right breast, upper outer quadrant: Secondary | ICD-10-CM | POA: Diagnosis not present

## 2020-08-24 DIAGNOSIS — N6320 Unspecified lump in the left breast, unspecified quadrant: Secondary | ICD-10-CM

## 2020-08-24 DIAGNOSIS — N6310 Unspecified lump in the right breast, unspecified quadrant: Secondary | ICD-10-CM

## 2020-08-24 DIAGNOSIS — R928 Other abnormal and inconclusive findings on diagnostic imaging of breast: Secondary | ICD-10-CM | POA: Diagnosis not present

## 2020-08-25 ENCOUNTER — Other Ambulatory Visit: Payer: Self-pay | Admitting: Adult Health Nurse Practitioner

## 2020-08-27 ENCOUNTER — Other Ambulatory Visit: Payer: Self-pay

## 2020-08-27 ENCOUNTER — Ambulatory Visit (INDEPENDENT_AMBULATORY_CARE_PROVIDER_SITE_OTHER): Payer: Medicare Other | Admitting: Ophthalmology

## 2020-08-27 ENCOUNTER — Encounter (INDEPENDENT_AMBULATORY_CARE_PROVIDER_SITE_OTHER): Payer: Self-pay | Admitting: Ophthalmology

## 2020-08-27 DIAGNOSIS — E113512 Type 2 diabetes mellitus with proliferative diabetic retinopathy with macular edema, left eye: Secondary | ICD-10-CM | POA: Diagnosis not present

## 2020-08-27 DIAGNOSIS — E113511 Type 2 diabetes mellitus with proliferative diabetic retinopathy with macular edema, right eye: Secondary | ICD-10-CM

## 2020-08-27 MED ORDER — AFLIBERCEPT 2MG/0.05ML IZ SOLN FOR KALEIDOSCOPE
2.0000 mg | INTRAVITREAL | Status: AC | PRN
  Administered 2020-08-27: 2 mg via INTRAVITREAL

## 2020-08-27 NOTE — Assessment & Plan Note (Signed)
Stable OD CSME at 5-week interval today postinjection Eylea, on CPAP now slightly more compliant up to 5 hours of use nightly

## 2020-08-27 NOTE — Progress Notes (Signed)
08/27/2020     CHIEF COMPLAINT Patient presents for Retina Follow Up (8 WK FU OS, POSS EYLEA OS///Pt reports stable vision OS, no new F/F OS, no pain or pressure OS. /////Last A1C: 6.9  taken 05/2020///Last BS: 135 taken 3 days ago. )   HISTORY OF PRESENT ILLNESS: Grace [name] is a 70 y.o. female who presents to the clinic today for:   HPI    Retina Follow Up    Patient presents with  Diabetic Retinopathy.  In left eye.  This started 8 weeks ago.  Duration of 8 weeks.  Since onset it is stable. Additional comments: 8 WK FU OS, POSS EYLEA OS   Pt reports stable vision OS, no new F/F OS, no pain or pressure OS.      Last A1C: 6.9  taken 05/2020   Last BS: 135 taken 3 days ago.        Last edited by Nichola Sizer D on 08/27/2020  4:15 PM. (History)      Referring physician: Unk Pinto, MD [address] [place],  [place] [address]  HISTORICAL INFORMATION:   Selected notes from the MEDICAL RECORD NUMBER    Lab Results  Component Value Date   HGBA1C 6.9 (H) 05/23/2020     CURRENT MEDICATIONS: No current outpatient medications on file. (Ophthalmic Drugs)   No current facility-administered medications for this visit. (Ophthalmic Drugs)   Current Outpatient Medications (Other)  Medication Sig  . Ascorbic Acid (VITAMIN C PO) Take 1 tablet by mouth daily.  . Calcium Citrate-Vitamin D (CITRACAL/VITAMIN D PO) Take 1 tablet by mouth daily.  . Cholecalciferol (VITAMIN D PO) Take 1,000 Units by mouth 2 (two) times daily. Reported on 02/07/2016  . DULoxetine (CYMBALTA) 30 MG capsule Take 1 capsule (30 mg total) by mouth daily.  Marland Kitchen glimepiride (AMARYL) 2 MG tablet Take     1 tablet    2 x /day with Meals     For Diabetes  . glucose blood (ACCU-CHEK GUIDE) test strip Check blood sugar 3 times a day-DX-E11.69 AND E78.5.  . Magnesium 250 MG TABS Take 2 tablets by mouth daily.   . metFORMIN (GLUCOPHAGE-XR) 500 MG 24 hr tablet TAKE 2 TABLETS 2 X /DAY  WITH MEALS FOR DIABETES  . Omega-3 Fatty Acids (OMEGA-3 FISH OIL PO) Take 1 capsule by mouth daily.  Marland Kitchen zinc gluconate 50 MG tablet Take 50 mg by mouth daily.   No current facility-administered medications for this visit. (Other)      REVIEW OF SYSTEMS:    ALLERGIES Allergies  Allergen Reactions  . Gabapentin Swelling    Swelling in legs and feet    PAST MEDICAL HISTORY Past Medical History:  Diagnosis Date  . Abnormality of gait 07/28/2013  . Diabetes ([place])   . Diabetic peripheral neuropathy ([place])   . Diabetic retinopathy ([place])   . Foot drop, bilateral 07/31/2014  . Peripheral edema   . Polyneuropathy in diabetes(357.2) 07/28/2013   Past Surgical History:  Procedure Laterality Date  . ABDOMINAL HYSTERECTOMY    . ANKLE FRACTURE SURGERY     left  . CATARACT EXTRACTION W/PHACO Right 02/21/2014   Dr. Herbert Deaner  . CATARACT EXTRACTION W/PHACO Left 04/10/2014   Dr. Herbert Deaner  . CATARACT EXTRACTION, BILATERAL    . HIP SURGERY     right  . TONSILLECTOMY      FAMILY HISTORY Family History  Problem Relation Age of Onset  . Diabetes Mother   . Diabetes Brother   .  Neuropathy Neg Hx     SOCIAL HISTORY Social History   Tobacco Use  . Smoking status: Never Smoker  . Smokeless tobacco: Never Used  Substance Use Topics  . Alcohol use: No  . Drug use: No         OPHTHALMIC EXAM:  Base Eye Exam    Visual Acuity (ETDRS)      Right Left   Dist [place] 20/60 +2 20/60   Dist ph  20/30 -2 NI       Tonometry (Tonopen, 4:21 PM)      Right Left   Pressure 15 15       Pupils      Pupils Dark Light Shape React APD   Right PERRL 3 3 Round Minimal None   Left PERRL 3 3 Round Minimal None       Visual Fields (Counting fingers)      Left Right    Full Full       Extraocular Movement      Right Left    Full Full       Neuro/Psych    Oriented x3: Yes   Mood/Affect: Normal       Dilation    Left eye: 1.0% Mydriacyl, 2.5% Phenylephrine @ 4:21 PM         Slit Lamp and Fundus Exam    External Exam      Right Left   External Normal Normal       Slit Lamp Exam      Right Left   Lids/Lashes Normal Normal   Conjunctiva/Sclera White and quiet White and quiet   Cornea Clear Clear   Anterior Chamber Deep and quiet Deep and quiet   Iris Round and reactive Round and reactive   Lens Posterior chamber intraocular lens Posterior chamber intraocular lens   Anterior Vitreous Normal Normal       Fundus Exam      Right Left   Posterior Vitreous  Posterior vitreous detachment   Disc  Normal   C/D Ratio  0.25   Macula  Microaneurysms, Macular thickening, Clinically significant macular edema, Mild clinically significant macular edema   Vessels  Severe nonproliferative diabetic retinopathy with retinal nonperfusion   Periphery  Good peripheral PRP          IMAGING AND PROCEDURES  Imaging and Procedures for 08/27/20  OCT, Retina - OU - Both Eyes       Right Eye Quality was good. Scan locations included subfoveal. Central Foveal Thickness: 288. Progression has improved. Findings include epiretinal membrane, cystoid macular edema.   Left Eye Scan locations included subfoveal. Central Foveal Thickness: 349. Progression has improved. Findings include cystoid macular edema, epiretinal membrane.   Notes OD today at 5-week follow-up, much less CSME, compared component of intravitreal Eylea and ongoing compliance improvement with CPAP's of nightly  OS similarly now is going 8 weeks without recurrent CME, CSME while using nightly CPAP       Intravitreal Injection, Pharmacologic Agent - OS - Left Eye       Time Out 08/27/2020. 4:38 PM. Confirmed correct patient, procedure, site, and patient consented.   Anesthesia Topical anesthesia was used. Anesthetic medications included [place] 3.5%.   Procedure Preparation included Tobramycin 0.3%, 5% betadine to ocular surface, 10% betadine to eyelids, Ofloxacin . A 30 gauge needle was used.    Injection:  2 mg aflibercept [name]) SOLN   NDC: [id number]   Route: Intravitreal, Site: Left Eye, Waste: 0 mg  Post-op Post injection exam found visual acuity of at least counting fingers. The patient tolerated the procedure well. There were no complications. The patient received written and verbal post procedure care education. Post injection medications were not given.                 ASSESSMENT/PLAN:  Diabetic macular edema of right eye with proliferative retinopathy associated with type 2 diabetes mellitus (HCC) Stable OD CSME at 5-week interval today postinjection Eylea, on CPAP now slightly more compliant up to 5 hours of use nightly  Proliferative diabetic retinopathy of left eye with macular edema associated with type 2 diabetes mellitus ([place]) CSME temporal to fovea, overall much improved and no center involvement now at 8-week follow-up today, coincident with improved nightly compliance of CPAP use to prevent nightly hypoxemia and worsening of maculopathy      ICD-10-CM   1. Proliferative diabetic retinopathy of left eye with macular edema associated with type 2 diabetes mellitus (HCC)  Z30.8657 OCT, Retina - OU - Both Eyes    Intravitreal Injection, Pharmacologic Agent - OS - Left Eye    aflibercept (EYLEA) SOLN 2 mg  2. Diabetic macular edema of right eye with proliferative retinopathy associated with type 2 diabetes mellitus ([place])  E11.3511     1.  Repeat injection intravitreal Eylea OS today dilate OS in 8 weeks  2.  Follow up OD as scheduled  3.  Ophthalmic Meds Ordered this visit:  Meds ordered this encounter  Medications  . aflibercept (EYLEA) SOLN 2 mg       No follow-ups on file.  There are no Patient Instructions on file for this visit.   Explained the diagnoses, plan, and follow up with the patient and they expressed understanding.  Patient expressed understanding of the importance of proper follow up care.   Clent Demark Kennth Vanbenschoten  M.D. Diseases & Surgery of the Retina and Vitreous Retina & Diabetic [facility] 08/27/20     Abbreviations: M myopia (nearsighted); A astigmatism; H hyperopia (farsighted); P presbyopia; Mrx spectacle prescription;  CTL contact lenses; OD right eye; OS left eye; OU both eyes  XT exotropia; ET esotropia; PEK punctate epithelial keratitis; PEE punctate epithelial erosions; DES dry eye syndrome; MGD meibomian gland dysfunction; ATs artificial tears; PFAT's preservative free artificial tears; [place] nuclear sclerotic cataract; PSC posterior subcapsular cataract; ERM epi-retinal membrane; PVD posterior vitreous detachment; RD retinal detachment; DM diabetes mellitus; DR diabetic retinopathy; NPDR non-proliferative diabetic retinopathy; PDR proliferative diabetic retinopathy; CSME clinically significant macular edema; DME diabetic macular edema; dbh dot blot hemorrhages; CWS cotton wool spot; POAG primary open angle glaucoma; C/D cup-to-disc ratio; HVF humphrey visual field; GVF goldmann visual field; OCT optical coherence tomography; IOP intraocular pressure; BRVO Branch retinal vein occlusion; CRVO central retinal vein occlusion; CRAO central retinal artery occlusion; BRAO branch retinal artery occlusion; RT retinal tear; SB scleral buckle; PPV pars plana vitrectomy; VH Vitreous hemorrhage; PRP panretinal laser photocoagulation; IVK intravitreal kenalog; VMT vitreomacular traction; MH Macular hole;  NVD neovascularization of the disc; NVE neovascularization elsewhere; AREDS age related eye disease study; ARMD age related macular degeneration; POAG primary open angle glaucoma; EBMD epithelial/anterior basement membrane dystrophy; ACIOL anterior chamber intraocular lens; IOL intraocular lens; PCIOL posterior chamber intraocular lens; Phaco/IOL phacoemulsification with intraocular lens placement; [place] photorefractive keratectomy; LASIK laser assisted in situ keratomileusis; HTN hypertension; DM diabetes mellitus; COPD  chronic obstructive pulmonary disease

## 2020-08-27 NOTE — Assessment & Plan Note (Signed)
CSME temporal to fovea, overall much improved and no center involvement now at 8-week follow-up today, coincident with improved nightly compliance of CPAP use to prevent nightly hypoxemia and worsening of maculopathy

## 2020-08-31 DIAGNOSIS — M810 Age-related osteoporosis without current pathological fracture: Secondary | ICD-10-CM | POA: Insufficient documentation

## 2020-08-31 DIAGNOSIS — E1122 Type 2 diabetes mellitus with diabetic chronic kidney disease: Secondary | ICD-10-CM | POA: Insufficient documentation

## 2020-08-31 DIAGNOSIS — N183 Chronic kidney disease, stage 3 unspecified: Secondary | ICD-10-CM | POA: Insufficient documentation

## 2020-08-31 DIAGNOSIS — N182 Chronic kidney disease, stage 2 (mild): Secondary | ICD-10-CM | POA: Insufficient documentation

## 2020-08-31 NOTE — Progress Notes (Deleted)
MEDICARE ANNUAL WELLNESS VISIT AND FOLLOW UP  Assessment:   Encounter for Medicare annual wellness exam 1 year  Diabetic polyneuropathy associated with diabetes mellitus due to underlying condition ([place]) -     glucose blood test strip; Test sugar 3 x a day. DX DM with hyperlipidemia -     Hemoglobin A1c ([facility]) Continue follow up  Type 2 diabetes mellitus with stage 2 chronic kidney disease, without long-term current use of insulin (HCC) -     glucose blood test strip; Test sugar 3 x a day. DX DM with hyperlipidemia -     COMPLETE METABOLIC PANEL WITH GFR -     Hemoglobin A1c ([facility]) Increase fluids, avoid NSAIDS, monitor sugars, will monitor  Type 2 diabetes mellitus with hyperlipidemia (HCC) -     glucose blood test strip; Test sugar 3 x a day. DX DM with hyperlipidemia -     Lipid Profile -     Hemoglobin A1c ([facility]) check lipids- STRONGLY suggest getting on low dose statin, patient declines at this time, will consider. *** decrease fatty foods increase activity.   Diabetes mellitus type 2 with peripheral artery disease (HCC) Control blood pressure, cholesterol, glucose, increase exercise.  -     glucose blood test strip; Test sugar 3 x a day. DX DM with hyperlipidemia -     Hemoglobin A1c ([facility]) -     Lipid profile   Diabetes mellitus with macular edema (HCC) -     glucose blood test strip; Test sugar 3 x a day. DX DM with hyperlipidemia -     Hemoglobin A1c ([facility]) - continue follow up every 6 weeks.   Medication management -     CBC with Diff -     COMPLETE METABOLIC PANEL WITH GFR -     Magnesium  Essential hypertension - continue medications, DASH diet, exercise and monitor at home. Call if greater than 130/80.  Foot drop, bilateral Has leg braces and follows with ortho  Vitamin D deficiency -     Vitamin D (25 hydroxy)  Fatty liver Check labs, avoid tylenol, alcohol, weight loss advised.   Macular degeneration, bil, unspecified type Continue  follow up  OSA on CPAP ***  Osteoporosis  ***   Over 30 minutes of exam, counseling, chart review, and critical decision making was performed  Future Appointments  Date Time Provider [place]  09/03/2020 10:30 AM Liane Comber, NP [facility] None  09/17/2020  3:45 PM Rankin, Clent Demark, MD [facility] None  10/22/2020  3:45 PM Rankin, Clent Demark, MD [facility] None  12/05/2020  9:30 AM Unk Pinto, MD [facility] None  01/22/2021 11:15 AM Suzzanne Cloud, NP [facility] None  06/06/2021 11:00 AM Unk Pinto, MD [facility] None    Plan:   During the course of the visit the patient was educated and counseled about appropriate screening and preventive services including:    Pneumococcal vaccine   Influenza vaccine  Td vaccine  Prevnar 13  Screening electrocardiogram  Screening mammography  Bone densitometry screening  Colorectal cancer screening  Diabetes screening  Glaucoma screening  Nutrition counseling   Advanced directives: given info/requested copies   Subjective:   Grace [name] is a 70 y.o. female who presents for Medicare Annual Wellness Visit and 3 month follow up on hypertension, diabetes, hyperlipidemia, vitamin D def.   She was referred for sleep study to Dr. Brett Fairy, sleep study 01/09/2020 was suggestive of mild sleep apnea and was recommended CPAP ***  BMI is There  is no height or weight on file to calculate BMI., she is working on diet and exercise. Wt Readings from Last 3 Encounters:  06/05/20 176 lb (79.8 kg)  05/23/20 174 lb 3.2 oz (79 kg)  01/19/20 174 lb (78.9 kg)   Her blood pressure is not checked at home, today their BP is   She does not workout. She denies chest pain, shortness of breath, dizziness.   She does have a history of diabetes managed with medication, diet, and exercise.  With PAD had ABI in 2015 that showed mild disease, not on ASA due to Mac Degen She has CKD She has neuropathy which is extensive and requires her  to use a walker to avoid falls, she is on cymbalta for neuropathy. She has braces on her legs to help prevent falls, no falls in past year but she is high risk.  With hyperlipidemia not at goal- long discussion about statins, will check today and see where we are at She has macular degeneration and edema following with Dr. [name], not on HCTZ due to this She does not have a meter at this time, needs a new one.  Has accucheck she is on 4 metformin a day   1 amaryl a day with largest meal no low blood sugar.    Lab Results  Component Value Date   HGBA1C 6.9 (H) 05/23/2020   Last GFR Lab Results  Component Value Date   GFRNONAA 52 (L) 05/23/2020   Lab Results  Component Value Date   CHOL 185 05/23/2020   HDL 54 05/23/2020   LDLCALC 100 (H) 05/23/2020   TRIG 191 (H) 05/23/2020   CHOLHDL 3.4 05/23/2020    Patient is on Vitamin D supplement. Lab Results  Component Value Date   VD25OH 70 05/23/2020      Medication Review  Current Outpatient Medications (Endocrine & Metabolic):  .  glimepiride (AMARYL) 2 MG tablet, Take     1 tablet    2 x /day with Meals     For Diabetes .  metFORMIN (GLUCOPHAGE-XR) 500 MG 24 hr tablet, TAKE 2 TABLETS 2 X /DAY WITH MEALS FOR DIABETES      Current Outpatient Medications (Other):  Marland Kitchen  Ascorbic Acid (VITAMIN C PO), Take 1 tablet by mouth daily. .  Calcium Citrate-Vitamin D (CITRACAL/VITAMIN D PO), Take 1 tablet by mouth daily. .  Cholecalciferol (VITAMIN D PO), Take 1,000 Units by mouth 2 (two) times daily. Reported on 02/07/2016 .  DULoxetine (CYMBALTA) 30 MG capsule, Take 1 capsule (30 mg total) by mouth daily. Marland Kitchen  glucose blood (ACCU-CHEK GUIDE) test strip, Check blood sugar 3 times a day-DX-E11.69 AND E78.5. .  Magnesium 250 MG TABS, Take 2 tablets by mouth daily.  .  Omega-3 Fatty Acids (OMEGA-3 FISH OIL PO), Take 1 capsule by mouth daily. Marland Kitchen  zinc gluconate 50 MG tablet, Take 50 mg by mouth daily.  Allergies: Allergies  Allergen  Reactions  . Gabapentin Swelling    Swelling in legs and feet    Current Problems (verified) has Diabetic polyneuropathy ([place]); Foot drop, bilateral; T2_NIDDM; Mixed hyperlipidemia; Vitamin D deficiency; Medication management; Essential hypertension; Fatty liver; Cholelithiases; Hyperlipidemia associated with type 2 diabetes mellitus ([place]); Diabetes mellitus type 2 with peripheral artery disease ([place]); Diabetic macular edema of right eye with proliferative retinopathy associated with type 2 diabetes mellitus ([place]); Proliferative diabetic retinopathy of left eye with macular edema associated with type 2 diabetes mellitus ([place]); Right epiretinal membrane; Non-restorative sleep; Snoring; RLS (restless  legs syndrome); OSA on CPAP; and CKD stage 3 due to type 2 diabetes mellitus ([place]) on their problem list.  Screening Tests Immunization History  Administered Date(s) Administered  . Influenza, High Dose Seasonal PF 05/12/2016, 07/03/2017, 05/24/2018, 06/10/2019  . Influenza-Unspecified 07/03/2017  . Pneumococcal Conjugate-13 02/07/2016  . Pneumococcal Polysaccharide-23 09/03/2017  . Td 02/07/2016    Preventative care: Cologuard 04/2018 due 04/2021 Last mammogram: 08/2020 Last pap smear/pelvic exam: s/p TAH   DEXA: 08/16/2019 T fem T-3.2, osteoporosis *** ? alendronate  Prior vaccinations: TD or Tdap: 2017 Influenza: 2020 ***  Pneumococcal: 2019 Prevnar13: 2017 Shingles/Zostavax: 2012 Covid 19: ***  Names of Other Physician/Practitioners you currently use: 1. [facility]- here for primary care 2. Dr. [name], eye doctor, last visit 07/2019 Dr. Zenaida Niece, dentist, last visit 2017  Patient Care Team: Unk Pinto, MD as PCP - General (Internal Medicine) [name] Clent Demark, MD as Consulting Physician (Ophthalmology) Kathrynn Ducking, MD as Consulting Physician (Neurology) Monna Fam, MD as Consulting Physician (Ophthalmology) Earnstine Regal, PA-C  as Physician Assistant (Obstetrics and Gynecology)  Surgical: She  has a past surgical history that includes Tonsillectomy; Abdominal hysterectomy; Hip surgery; Ankle fracture surgery; Cataract extraction, bilateral; Cataract extraction w/PHACO (Right, 02/21/2014); and Cataract extraction w/PHACO (Left, 04/10/2014). Family Her family history includes Diabetes in her brother and mother. Social history  She reports that she has never smoked. She has never used smokeless tobacco. She reports that she does not drink alcohol and does not use drugs.  MEDICARE WELLNESS OBJECTIVES: Physical activity:   Cardiac risk factors:   Depression/mood screen:   Depression screen [facility] 2/9 05/22/2020  Decreased Interest 0  Down, Depressed, Hopeless 0  PHQ - 2 Score 0  Altered sleeping -  Tired, decreased energy -  Change in appetite -  Feeling bad or failure about yourself  -  Trouble concentrating -  Moving slowly or fidgety/restless -  Suicidal thoughts -  PHQ-9 Score -  Difficult doing work/chores -    ADLs:  In your present state of health, do you have any difficulty performing the following activities: 05/22/2020 11/10/2019  Hearing? N N  Vision? Y N  Comment Severe Daibetic Retinopathy -  Difficulty concentrating or making decisions? N N  Walking or climbing stairs? N N  Dressing or bathing? N N  Doing errands, shopping? Y N  Some recent data might be hidden     Cognitive Testing  Alert? Yes  Normal Appearance?Yes  Oriented to person? Yes  Place? Yes   Time? Yes  Recall of three objects?  Yes  Can perform simple calculations? Yes  Displays appropriate judgment?Yes  Can read the correct time from a watch face?Yes  EOL planning:     Objective:   There were no vitals filed for this visit. There is no height or weight on file to calculate BMI.  General appearance: alert, no distress, WD/WN,  female HEENT: normocephalic, sclerae anicteric, TMs pearly, nares patent, no discharge or  erythema, pharynx normal Oral cavity: MMM, no lesions Neck: supple, no lymphadenopathy, no thyromegaly, no masses Heart: RRR, normal S1, S2, no murmurs Lungs: CTA bilaterally, no wheezes, rhonchi, or rales Abdomen: +bs, soft, non tender, non distended, no masses, no hepatomegaly, no splenomegaly Musculoskeletal: nontender, no swelling, no obvious deformity Extremities: mild edema, no cyanosis, no clubbing, dependent rubor Pulses: 2+ symmetric upper pulses, bilateral TP 1+ and DP absent, decrease cap refill, left toe with blood blister.  Neurological: alert, oriented x 3, CN2-12 intact,  strength normal upper extremities and lower extremities, sensation decreased to knee bilaterally, gait antalgic, absent ankle reflex bilaterally.  Psychiatric: normal affect, behavior normal, pleasant  Breast: defer Gyn: defer Rectal: defer   Medicare Attestation I have personally reviewed: The patient's medical and social history Their use of alcohol, tobacco or illicit drugs Their current medications and supplements The patient's functional ability including ADLs,fall risks, home safety risks, cognitive, and hearing and visual impairment Diet and physical activities Evidence for depression or mood disorders  The patient's weight, height, BMI, and visual acuity have been recorded in the chart.  I have made referrals, counseling, and provided education to the patient based on review of the above and I have provided the patient with a written personalized care plan for preventive services.     Izora Ribas, NP   08/31/2020

## 2020-09-03 ENCOUNTER — Ambulatory Visit: Payer: Medicare Other | Admitting: Adult Health

## 2020-09-03 DIAGNOSIS — K760 Fatty (change of) liver, not elsewhere classified: Secondary | ICD-10-CM

## 2020-09-03 DIAGNOSIS — I10 Essential (primary) hypertension: Secondary | ICD-10-CM

## 2020-09-03 DIAGNOSIS — E1151 Type 2 diabetes mellitus with diabetic peripheral angiopathy without gangrene: Secondary | ICD-10-CM

## 2020-09-03 DIAGNOSIS — Z0000 Encounter for general adult medical examination without abnormal findings: Secondary | ICD-10-CM

## 2020-09-03 DIAGNOSIS — E0842 Diabetes mellitus due to underlying condition with diabetic polyneuropathy: Secondary | ICD-10-CM

## 2020-09-03 DIAGNOSIS — G4733 Obstructive sleep apnea (adult) (pediatric): Secondary | ICD-10-CM

## 2020-09-03 DIAGNOSIS — E113512 Type 2 diabetes mellitus with proliferative diabetic retinopathy with macular edema, left eye: Secondary | ICD-10-CM

## 2020-09-03 DIAGNOSIS — E113511 Type 2 diabetes mellitus with proliferative diabetic retinopathy with macular edema, right eye: Secondary | ICD-10-CM

## 2020-09-03 DIAGNOSIS — M810 Age-related osteoporosis without current pathological fracture: Secondary | ICD-10-CM

## 2020-09-03 DIAGNOSIS — E559 Vitamin D deficiency, unspecified: Secondary | ICD-10-CM

## 2020-09-03 DIAGNOSIS — E1122 Type 2 diabetes mellitus with diabetic chronic kidney disease: Secondary | ICD-10-CM

## 2020-09-03 DIAGNOSIS — N183 Chronic kidney disease, stage 3 unspecified: Secondary | ICD-10-CM

## 2020-09-03 DIAGNOSIS — E1169 Type 2 diabetes mellitus with other specified complication: Secondary | ICD-10-CM

## 2020-09-03 DIAGNOSIS — E782 Mixed hyperlipidemia: Secondary | ICD-10-CM

## 2020-09-03 DIAGNOSIS — Z79899 Other long term (current) drug therapy: Secondary | ICD-10-CM

## 2020-09-17 ENCOUNTER — Encounter (INDEPENDENT_AMBULATORY_CARE_PROVIDER_SITE_OTHER): Payer: Self-pay | Admitting: Ophthalmology

## 2020-09-17 ENCOUNTER — Ambulatory Visit (INDEPENDENT_AMBULATORY_CARE_PROVIDER_SITE_OTHER): Payer: Medicare Other | Admitting: Ophthalmology

## 2020-09-17 ENCOUNTER — Other Ambulatory Visit: Payer: Self-pay

## 2020-09-17 DIAGNOSIS — H35371 Puckering of macula, right eye: Secondary | ICD-10-CM | POA: Diagnosis not present

## 2020-09-17 DIAGNOSIS — E113511 Type 2 diabetes mellitus with proliferative diabetic retinopathy with macular edema, right eye: Secondary | ICD-10-CM | POA: Diagnosis not present

## 2020-09-17 DIAGNOSIS — E113512 Type 2 diabetes mellitus with proliferative diabetic retinopathy with macular edema, left eye: Secondary | ICD-10-CM

## 2020-09-17 MED ORDER — AFLIBERCEPT 2MG/0.05ML IZ SOLN FOR KALEIDOSCOPE
2.0000 mg | INTRAVITREAL | Status: AC | PRN
  Administered 2020-09-17: 2 mg via INTRAVITREAL

## 2020-09-17 NOTE — Assessment & Plan Note (Signed)
Today CSME much improved little over 2 weeks post last injection Eylea

## 2020-09-17 NOTE — Assessment & Plan Note (Signed)
Minor, nasal to fovea, no impact on acuity

## 2020-09-17 NOTE — Assessment & Plan Note (Signed)
Now at 8-week follow-up, minor recurrence of CME in the CSME temporal portion of the macula OD.  Incidentally patient now using CPAP consistently 5 and sometimes closing in on 6 hours without removal of the mask.  Is an improvement in nightly treatment of hypoxemia

## 2020-09-17 NOTE — Progress Notes (Signed)
09/17/2020     CHIEF COMPLAINT Patient presents for Retina Follow Up (8 Week PDR f\u OD. Possible Eylea OD. OCT/Pt states she has noticed a decrease in OD vision. Pt states she first noticed this about 1 week ago./BGL: 183 this AM/)   HISTORY OF PRESENT ILLNESS: Grace [name] is a 70 y.o. female who presents to the clinic today for:   HPI    Retina Follow Up    Patient presents with  Diabetic Retinopathy.  In right eye.  Severity is moderate.  Duration of 8 weeks.  Since onset it is stable.  I, the attending physician,  performed the HPI with the patient and updated documentation appropriately. Additional comments: 8 Week PDR f\u OD. Possible Eylea OD. OCT Pt states she has noticed a decrease in OD vision. Pt states she first noticed this about 1 week ago. BGL: 183 this AM        Last edited by Tilda Franco on 09/17/2020  4:04 PM. (History)      Referring physician: Unk Pinto, MD [address] [place],   [address]  HISTORICAL INFORMATION:   Selected notes from the MEDICAL RECORD NUMBER    Lab Results  Component Value Date   HGBA1C 6.9 (H) 05/23/2020     CURRENT MEDICATIONS: No current outpatient medications on file. (Ophthalmic Drugs)   No current facility-administered medications for this visit. (Ophthalmic Drugs)   Current Outpatient Medications (Other)  Medication Sig  . Ascorbic Acid (VITAMIN C PO) Take 1 tablet by mouth daily.  . Calcium Citrate-Vitamin D (CITRACAL/VITAMIN D PO) Take 1 tablet by mouth daily.  . Cholecalciferol (VITAMIN D PO) Take 1,000 Units by mouth 2 (two) times daily. Reported on 02/07/2016  . DULoxetine (CYMBALTA) 30 MG capsule Take 1 capsule (30 mg total) by mouth daily.  Marland Kitchen glimepiride (AMARYL) 2 MG tablet Take     1 tablet    2 x /day with Meals     For Diabetes  . glucose blood (ACCU-CHEK GUIDE) test strip Check blood sugar 3 times a day-DX-E11.69 AND E78.5.  . Magnesium 250 MG TABS Take 2 tablets  by mouth daily.   . metFORMIN (GLUCOPHAGE-XR) 500 MG 24 hr tablet TAKE 2 TABLETS 2 X /DAY WITH MEALS FOR DIABETES  . Omega-3 Fatty Acids (OMEGA-3 FISH OIL PO) Take 1 capsule by mouth daily.  Marland Kitchen zinc gluconate 50 MG tablet Take 50 mg by mouth daily.   No current facility-administered medications for this visit. (Other)      REVIEW OF SYSTEMS: ROS    Positive for: Endocrine   Last edited by Tilda Franco on 09/17/2020  4:04 PM. (History)       ALLERGIES Allergies  Allergen Reactions  . Gabapentin Swelling    Swelling in legs and feet    PAST MEDICAL HISTORY Past Medical History:  Diagnosis Date  . Abnormality of gait 07/28/2013  . Diabetes ([place])   . Diabetic peripheral neuropathy ([place])   . Diabetic retinopathy ([place])   . Foot drop, bilateral 07/31/2014  . Peripheral edema   . Polyneuropathy in diabetes(357.2) 07/28/2013   Past Surgical History:  Procedure Laterality Date  . ABDOMINAL HYSTERECTOMY    . ANKLE FRACTURE SURGERY     left  . CATARACT EXTRACTION W/PHACO Right 02/21/2014   Dr. Herbert Deaner  . CATARACT EXTRACTION W/PHACO Left 04/10/2014   Dr. Herbert Deaner  . CATARACT EXTRACTION, BILATERAL    . HIP SURGERY     right  .  TONSILLECTOMY      FAMILY HISTORY Family History  Problem Relation Age of Onset  . Diabetes Mother   . Diabetes Brother   . Neuropathy Neg Hx     SOCIAL HISTORY Social History   Tobacco Use  . Smoking status: Never Smoker  . Smokeless tobacco: Never Used  Substance Use Topics  . Alcohol use: No  . Drug use: No         OPHTHALMIC EXAM:  Base Eye Exam    Visual Acuity (Snellen - Linear)      Right Left   Dist [place] 20/80 +2 20/60 -1   Dist ph [place] 20/40 20/60 +1       Tonometry (Tonopen, 4:09 PM)      Right Left   Pressure 22 18       Tonometry #2 (Tonopen, 4:09 PM)      Right Left   Pressure 22        Pupils      Pupils Dark Light Shape React APD   Right PERRL 3 3 Round Minimal None   Left PERRL 3 3 Round Minimal None        Visual Fields (Counting fingers)      Left Right    Full Full       Neuro/Psych    Oriented x3: Yes   Mood/Affect: Normal       Dilation    Right eye: 1.0% Mydriacyl, 2.5% Phenylephrine @ 4:09 PM        Slit Lamp and Fundus Exam    External Exam      Right Left   External Normal Normal       Slit Lamp Exam      Right Left   Lids/Lashes Normal Normal   Conjunctiva/Sclera White and quiet White and quiet   Cornea Clear Clear   Anterior Chamber Deep and quiet Deep and quiet   Iris Round and reactive Round and reactive   Lens Posterior chamber intraocular lens Posterior chamber intraocular lens   Anterior Vitreous Normal Normal       Fundus Exam      Right Left   Posterior Vitreous Normal    Disc Normal    C/D Ratio 0.3    Macula Epiretinal membrane, good focal laser, Macular thickening    Vessels PDR-quiet    Periphery good PRP,, ATTACHED           IMAGING AND PROCEDURES  Imaging and Procedures for 09/17/20  OCT, Retina - OU - Both Eyes       Right Eye Quality was good. Scan locations included subfoveal. Central Foveal Thickness: 335. Progression has worsened. Findings include epiretinal membrane, cystoid macular edema.   Left Eye Scan locations included subfoveal. Central Foveal Thickness: 296. Progression has improved. Findings include cystoid macular edema, epiretinal membrane.   Notes OD today at 8-week follow-up, much less CSME, compared component of intravitreal Eylea and ongoing compliance improvement with CPAP's of nightly  OS similarly now is going 2 weeks without recurrent CME, CSME while using nightly CPAP        Intravitreal Injection, Pharmacologic Agent - OD - Right Eye       Time Out 09/17/2020. 4:39 PM. Confirmed correct patient, procedure, site, and patient consented.   Anesthesia Topical anesthesia was used. Anesthetic medications included Akten 3.5%.   Procedure Preparation included 10% betadine to eyelids, 5%  betadine to ocular surface, Ofloxacin . A 30 gauge needle was used.   Injection:  2 mg aflibercept [name]) SOLN   NDC: [id number], Lot: [id number]   Route: Intravitreal, Site: Right Eye, Waste: 0 mg  Post-op Post injection exam found visual acuity of at least counting fingers. The patient tolerated the procedure well. There were no complications. The patient received written and verbal post procedure care education. Post injection medications were not given.                 ASSESSMENT/PLAN:  Diabetic macular edema of right eye with proliferative retinopathy associated with type 2 diabetes mellitus ([place]) Now at 8-week follow-up, minor recurrence of CME in the CSME temporal portion of the macula OD.  Incidentally patient now using CPAP consistently 5 and sometimes closing in on 6 hours without removal of the mask.  Is an improvement in nightly treatment of hypoxemia  Right epiretinal membrane Minor, nasal to fovea, no impact on acuity  Proliferative diabetic retinopathy of left eye with macular edema associated with type 2 diabetes mellitus ([place]) Today CSME much improved little over 2 weeks post last injection Eylea      ICD-10-CM   1. Diabetic macular edema of right eye with proliferative retinopathy associated with type 2 diabetes mellitus (HCC)  E11.3511 OCT, Retina - OU - Both Eyes    Intravitreal Injection, Pharmacologic Agent - OD - Right Eye    aflibercept (EYLEA) SOLN 2 mg  2. Right epiretinal membrane  H35.371   3. Proliferative diabetic retinopathy of left eye with macular edema associated with type 2 diabetes mellitus ([place])  [id number]     1.  Improved CSME on Eylea in conjunction with improved CPAP compliance and tolerance nightly.  Ration of therapy has improved from 5 to 6 weeks now to 7 to 8 weeks in the right eye and similarly on the left.  2.  Will retreat with Eylea intravitreal OD today at 8-week interval and decrease to 7 weeks next visit  OD  3.  Ophthalmic Meds Ordered this visit:  Meds ordered this encounter  Medications  . aflibercept (EYLEA) SOLN 2 mg       Return in about 7 weeks (around 11/05/2020) for dilate, OD, EYLEA OCT.  There are no Patient Instructions on file for this visit.   Explained the diagnoses, plan, and follow up with the patient and they expressed understanding.  Patient expressed understanding of the importance of proper follow up care.   Clent Demark Lizmary Nader M.D. Diseases & Surgery of the Retina and Vitreous Retina & Diabetic [place] 09/17/20     Abbreviations: M myopia (nearsighted); A astigmatism; H hyperopia (farsighted); P presbyopia; Mrx spectacle prescription;  CTL contact lenses; OD right eye; OS left eye; OU both eyes  XT exotropia; ET esotropia; PEK punctate epithelial keratitis; PEE punctate epithelial erosions; DES dry eye syndrome; MGD meibomian gland dysfunction; ATs artificial tears; PFAT's preservative free artificial tears; [place] nuclear sclerotic cataract; PSC posterior subcapsular cataract; ERM epi-retinal membrane; PVD posterior vitreous detachment; RD retinal detachment; DM diabetes mellitus; DR diabetic retinopathy; NPDR non-proliferative diabetic retinopathy; PDR proliferative diabetic retinopathy; CSME clinically significant macular edema; DME diabetic macular edema; dbh dot blot hemorrhages; CWS cotton wool spot; POAG primary open angle glaucoma; C/D cup-to-disc ratio; HVF humphrey visual field; GVF goldmann visual field; OCT optical coherence tomography; IOP intraocular pressure; BRVO Branch retinal vein occlusion; CRVO central retinal vein occlusion; CRAO central retinal artery occlusion; BRAO branch retinal artery occlusion; RT retinal tear; SB scleral buckle; PPV pars plana vitrectomy; VH Vitreous hemorrhage; PRP panretinal laser photocoagulation; IVK intravitreal  kenalog; VMT vitreomacular traction; MH Macular hole;  NVD neovascularization of the disc; NVE neovascularization  elsewhere; AREDS age related eye disease study; ARMD age related macular degeneration; POAG primary open angle glaucoma; EBMD epithelial/anterior basement membrane dystrophy; ACIOL anterior chamber intraocular lens; IOL intraocular lens; PCIOL posterior chamber intraocular lens; Phaco/IOL phacoemulsification with intraocular lens placement; [place] photorefractive keratectomy; LASIK laser assisted in situ keratomileusis; HTN hypertension; DM diabetes mellitus; COPD chronic obstructive pulmonary disease

## 2020-10-09 ENCOUNTER — Other Ambulatory Visit: Payer: Self-pay | Admitting: Neurology

## 2020-10-09 ENCOUNTER — Telehealth: Payer: Self-pay | Admitting: Family Medicine

## 2020-10-09 NOTE — Telephone Encounter (Signed)
Pt. is requesting a refill for DULoxetine (CYMBALTA) 30 MG capsule.  Pharmacy: [id number]

## 2020-10-09 NOTE — Telephone Encounter (Signed)
LMVM for pt to return call , [facility].  [facility] will need new prescription just want to clarify.

## 2020-10-09 NOTE — Telephone Encounter (Signed)
Pt return phone call, would like prescription sent to [facility] on [address].

## 2020-10-10 MED ORDER — DULOXETINE HCL 30 MG PO CPEP: 30.0000 mg | ORAL_CAPSULE | Freq: Every day | ORAL | 1 refills | Status: DC

## 2020-10-10 NOTE — Addendum Note (Signed)
Addended by: Brandon Melnick on: 10/10/2020 09:07 AM   Modules accepted: Orders

## 2020-10-10 NOTE — Telephone Encounter (Signed)
Done to [facility] [place] per pt request.

## 2020-10-22 ENCOUNTER — Encounter (INDEPENDENT_AMBULATORY_CARE_PROVIDER_SITE_OTHER): Payer: Medicare Other | Admitting: Ophthalmology

## 2020-10-29 ENCOUNTER — Encounter (INDEPENDENT_AMBULATORY_CARE_PROVIDER_SITE_OTHER): Payer: Medicare Other | Admitting: Ophthalmology

## 2020-10-30 ENCOUNTER — Encounter (INDEPENDENT_AMBULATORY_CARE_PROVIDER_SITE_OTHER): Payer: Medicare Other | Admitting: Ophthalmology

## 2020-11-05 ENCOUNTER — Ambulatory Visit (INDEPENDENT_AMBULATORY_CARE_PROVIDER_SITE_OTHER): Payer: Medicare Other | Admitting: Ophthalmology

## 2020-11-05 ENCOUNTER — Other Ambulatory Visit: Payer: Self-pay

## 2020-11-05 ENCOUNTER — Encounter (INDEPENDENT_AMBULATORY_CARE_PROVIDER_SITE_OTHER): Payer: Self-pay | Admitting: Ophthalmology

## 2020-11-05 DIAGNOSIS — E113511 Type 2 diabetes mellitus with proliferative diabetic retinopathy with macular edema, right eye: Secondary | ICD-10-CM | POA: Diagnosis not present

## 2020-11-05 DIAGNOSIS — G4733 Obstructive sleep apnea (adult) (pediatric): Secondary | ICD-10-CM | POA: Diagnosis not present

## 2020-11-05 DIAGNOSIS — Z9989 Dependence on other enabling machines and devices: Secondary | ICD-10-CM

## 2020-11-05 DIAGNOSIS — E113512 Type 2 diabetes mellitus with proliferative diabetic retinopathy with macular edema, left eye: Secondary | ICD-10-CM | POA: Diagnosis not present

## 2020-11-05 MED ORDER — AFLIBERCEPT 2MG/0.05ML IZ SOLN FOR KALEIDOSCOPE
2.0000 mg | INTRAVITREAL | Status: AC | PRN
  Administered 2020-11-05: 2 mg via INTRAVITREAL

## 2020-11-05 NOTE — Assessment & Plan Note (Signed)
Vastly improved macular edema and stability at 7-week interval today post Eylea, with continued improvement.  We will repeat injection today and examination again in 7 weeks

## 2020-11-05 NOTE — Assessment & Plan Note (Signed)
Successful CPAP use has allowed intravitreal injections for diabetic macular edema to be more effective at 4 weeks and even more important to stretch out the examination intervals and treatment intervals to 7 weeks without recurrence of CSME

## 2020-11-05 NOTE — Progress Notes (Signed)
11/05/2020     CHIEF COMPLAINT Patient presents for Retina Follow Up (7 Week PDR f\u OD. Possible Eylea OD. OCT/Pt c/ vision being worse. Denies new floaters and FOL./BGL: did not check/A1C: 6.9)   HISTORY OF PRESENT ILLNESS: Grace [name] is a 70 y.o. female who presents to the clinic today for:   HPI    Retina Follow Up    Patient presents with  Diabetic Retinopathy.  In right eye.  Severity is moderate.  Duration of 7 weeks.  Since onset it is stable.  I, the attending physician,  performed the HPI with the patient and updated documentation appropriately. Additional comments: 7 Week PDR f\u OD. Possible Eylea OD. OCT Pt c/ vision being worse. Denies new floaters and FOL. BGL: did not check A1C: 6.9       Last edited by Tilda Franco on 11/05/2020  3:56 PM. (History)      Referring physician: Unk Pinto, MD [address] [place],  [place] [address]  HISTORICAL INFORMATION:   Selected notes from the MEDICAL RECORD NUMBER    Lab Results  Component Value Date   HGBA1C 6.9 (H) 05/23/2020     CURRENT MEDICATIONS: No current outpatient medications on file. (Ophthalmic Drugs)   No current facility-administered medications for this visit. (Ophthalmic Drugs)   Current Outpatient Medications (Other)  Medication Sig  . Ascorbic Acid (VITAMIN C PO) Take 1 tablet by mouth daily.  . Calcium Citrate-Vitamin D (CITRACAL/VITAMIN D PO) Take 1 tablet by mouth daily.  . Cholecalciferol (VITAMIN D PO) Take 1,000 Units by mouth 2 (two) times daily. Reported on 02/07/2016  . DULoxetine (CYMBALTA) 30 MG capsule Take 1 capsule (30 mg total) by mouth daily.  Marland Kitchen glimepiride (AMARYL) 2 MG tablet Take     1 tablet    2 x /day with Meals     For Diabetes  . glucose blood (ACCU-CHEK GUIDE) test strip Check blood sugar 3 times a day-DX-E11.69 AND E78.5.  . Magnesium 250 MG TABS Take 2 tablets by mouth daily.   . metFORMIN (GLUCOPHAGE-XR) 500 MG 24 hr tablet TAKE 2  TABLETS 2 X /DAY WITH MEALS FOR DIABETES  . Omega-3 Fatty Acids (OMEGA-3 FISH OIL PO) Take 1 capsule by mouth daily.  Marland Kitchen zinc gluconate 50 MG tablet Take 50 mg by mouth daily.   No current facility-administered medications for this visit. (Other)      REVIEW OF SYSTEMS: ROS    Positive for: Endocrine   Last edited by Tilda Franco on 11/05/2020  3:56 PM. (History)       ALLERGIES Allergies  Allergen Reactions  . Gabapentin Swelling    Swelling in legs and feet    PAST MEDICAL HISTORY Past Medical History:  Diagnosis Date  . Abnormality of gait 07/28/2013  . Diabetes ([place])   . Diabetic peripheral neuropathy ([place])   . Diabetic retinopathy ([place])   . Foot drop, bilateral 07/31/2014  . Peripheral edema   . Polyneuropathy in diabetes(357.2) 07/28/2013   Past Surgical History:  Procedure Laterality Date  . ABDOMINAL HYSTERECTOMY    . ANKLE FRACTURE SURGERY     left  . CATARACT EXTRACTION W/PHACO Right 02/21/2014   Dr. Herbert Deaner  . CATARACT EXTRACTION W/PHACO Left 04/10/2014   Dr. Herbert Deaner  . CATARACT EXTRACTION, BILATERAL    . HIP SURGERY     right  . TONSILLECTOMY      FAMILY HISTORY Family History  Problem Relation Age of Onset  .  Diabetes Mother   . Diabetes Brother   . Neuropathy Neg Hx     SOCIAL HISTORY Social History   Tobacco Use  . Smoking status: Never Smoker  . Smokeless tobacco: Never Used  Substance Use Topics  . Alcohol use: No  . Drug use: No         OPHTHALMIC EXAM:  Base Eye Exam    Visual Acuity (Snellen - Linear)      Right Left   Dist [place] 20/60 20/80   Dist ph [place] 20/30 -2 20/50 -1       Tonometry (Tonopen, 4:01 PM)      Right Left   Pressure 20 19       Pupils      Pupils Dark Light Shape React APD   Right PERRL 3 3 Round Minimal None   Left PERRL 3 3 Round Minimal None       Visual Fields (Counting fingers)      Left Right    Full Full       Neuro/Psych    Oriented x3: Yes   Mood/Affect: Normal        Dilation    Right eye: 1.0% Mydriacyl, 2.5% Phenylephrine @ 4:01 PM        Slit Lamp and Fundus Exam    External Exam      Right Left   External Normal Normal       Slit Lamp Exam      Right Left   Lids/Lashes Normal Normal   Conjunctiva/Sclera White and quiet White and quiet   Cornea Clear Clear   Anterior Chamber Deep and quiet Deep and quiet   Iris Round and reactive Round and reactive   Lens Posterior chamber intraocular lens Posterior chamber intraocular lens   Anterior Vitreous Normal Normal       Fundus Exam      Right Left   Posterior Vitreous Normal    Disc Normal    C/D Ratio 0.3    Macula Epiretinal membrane, good focal laser, Macular thickening    Vessels PDR-quiet    Periphery good PRP,, ATTACHED           IMAGING AND PROCEDURES  Imaging and Procedures for 11/05/20  OCT, Retina - OU - Both Eyes       Right Eye Quality was good. Scan locations included subfoveal. Central Foveal Thickness: 297. Findings include epiretinal membrane.   Left Eye Quality was good. Scan locations included subfoveal. Central Foveal Thickness: 327.   Notes CSME has continued to remain stable and even improved at 7-week interval in the right eye post injection.       Intravitreal Injection, Pharmacologic Agent - OD - Right Eye       Time Out 11/05/2020. 5:27 PM. Confirmed correct patient, procedure, site, and patient consented.   Anesthesia Topical anesthesia was used. Anesthetic medications included Akten 3.5%.   Procedure Preparation included 10% betadine to eyelids, 5% betadine to ocular surface, Ofloxacin . A 30 gauge needle was used.   Injection:  2 mg aflibercept [name]) SOLN   NDC: [id number], Lot: [id number]   Route: Intravitreal, Site: Right Eye, Waste: 0 mg  Post-op Post injection exam found visual acuity of at least counting fingers. The patient tolerated the procedure well. There were no complications. The patient received written and verbal post  procedure care education. Post injection medications were not given.  ASSESSMENT/PLAN:  OSA on CPAP Successful CPAP use has allowed intravitreal injections for diabetic macular edema to be more effective at 4 weeks and even more important to stretch out the examination intervals and treatment intervals to 7 weeks without recurrence of CSME  Diabetic macular edema of right eye with proliferative retinopathy associated with type 2 diabetes mellitus (HCC) Vastly improved macular edema and stability at 7-week interval today post Eylea, with continued improvement.  We will repeat injection today and examination again in 7 weeks  Proliferative diabetic retinopathy of left eye with macular edema associated with type 2 diabetes mellitus ([place]) Follow-up as scheduled OS      ICD-10-CM   1. Diabetic macular edema of right eye with proliferative retinopathy associated with type 2 diabetes mellitus (HCC)  E11.3511 OCT, Retina - OU - Both Eyes    Intravitreal Injection, Pharmacologic Agent - OD - Right Eye    aflibercept (EYLEA) SOLN 2 mg  2. OSA on CPAP  G47.33    Z99.89   3. Proliferative diabetic retinopathy of left eye with macular edema associated with type 2 diabetes mellitus ([place])  U04.5409     1.  OD, CSME at 7-week has remained stable in fact even improved.  The duration or interval of therapy has been increased due to con current use of CPAP in my opinion.  Repeat Eylea today and examination again in 7 weeks  2.  OS, was scheduled for prior examination currently at 10-week follow-up and CSME has also overall in better and stable follow-up as scheduled  3.  Ophthalmic Meds Ordered this visit:  Meds ordered this encounter  Medications  . aflibercept (EYLEA) SOLN 2 mg       Return in about 7 weeks (around 12/24/2020) for dilate, OD, EYLEA OCT.  There are no Patient Instructions on file for this visit.   Explained the diagnoses, plan, and follow up with the  patient and they expressed understanding.  Patient expressed understanding of the importance of proper follow up care.   Clent Demark Rankin M.D. Diseases & Surgery of the Retina and Vitreous Retina & Diabetic [place] 11/05/20     Abbreviations: M myopia (nearsighted); A astigmatism; H hyperopia (farsighted); P presbyopia; Mrx spectacle prescription;  CTL contact lenses; OD right eye; OS left eye; OU both eyes  XT exotropia; ET esotropia; PEK punctate epithelial keratitis; PEE punctate epithelial erosions; DES dry eye syndrome; MGD meibomian gland dysfunction; ATs artificial tears; PFAT's preservative free artificial tears; [place] nuclear sclerotic cataract; PSC posterior subcapsular cataract; ERM epi-retinal membrane; PVD posterior vitreous detachment; RD retinal detachment; DM diabetes mellitus; DR diabetic retinopathy; NPDR non-proliferative diabetic retinopathy; PDR proliferative diabetic retinopathy; CSME clinically significant macular edema; DME diabetic macular edema; dbh dot blot hemorrhages; CWS cotton wool spot; POAG primary open angle glaucoma; C/D cup-to-disc ratio; HVF humphrey visual field; GVF goldmann visual field; OCT optical coherence tomography; IOP intraocular pressure; BRVO Branch retinal vein occlusion; CRVO central retinal vein occlusion; CRAO central retinal artery occlusion; BRAO branch retinal artery occlusion; RT retinal tear; SB scleral buckle; PPV pars plana vitrectomy; VH Vitreous hemorrhage; PRP panretinal laser photocoagulation; IVK intravitreal kenalog; VMT vitreomacular traction; MH Macular hole;  NVD neovascularization of the disc; NVE neovascularization elsewhere; AREDS age related eye disease study; ARMD age related macular degeneration; POAG primary open angle glaucoma; EBMD epithelial/anterior basement membrane dystrophy; ACIOL anterior chamber intraocular lens; IOL intraocular lens; PCIOL posterior chamber intraocular lens; Phaco/IOL phacoemulsification with intraocular  lens placement; PRK photorefractive keratectomy; LASIK laser assisted  in situ keratomileusis; HTN hypertension; DM diabetes mellitus; COPD chronic obstructive pulmonary disease

## 2020-11-05 NOTE — Assessment & Plan Note (Signed)
Follow-up as scheduled OS

## 2020-11-12 ENCOUNTER — Ambulatory Visit (INDEPENDENT_AMBULATORY_CARE_PROVIDER_SITE_OTHER): Payer: Medicare Other | Admitting: Ophthalmology

## 2020-11-12 ENCOUNTER — Other Ambulatory Visit: Payer: Self-pay

## 2020-11-12 ENCOUNTER — Encounter (INDEPENDENT_AMBULATORY_CARE_PROVIDER_SITE_OTHER): Payer: Self-pay | Admitting: Ophthalmology

## 2020-11-12 DIAGNOSIS — E113512 Type 2 diabetes mellitus with proliferative diabetic retinopathy with macular edema, left eye: Secondary | ICD-10-CM | POA: Diagnosis not present

## 2020-11-12 MED ORDER — AFLIBERCEPT 2MG/0.05ML IZ SOLN FOR KALEIDOSCOPE
2.0000 mg | INTRAVITREAL | Status: AC | PRN
  Administered 2020-11-12: 2 mg via INTRAVITREAL

## 2020-11-12 NOTE — Assessment & Plan Note (Signed)
Slight increase in CSME center involved at 11-week follow-up.  Repeat injection today and examination again left eye in 9 weeks

## 2020-11-12 NOTE — Progress Notes (Signed)
11/12/2020     CHIEF COMPLAINT Patient presents for Retina Follow Up (11 Week PDR f\u OS. Possible Eylea OS. OCT/Pt states vision is a little better in OD. Denies new complaints./BGL: 155)   HISTORY OF PRESENT ILLNESS: Grace [name] is a 70 y.o. female who presents to the clinic today for:   HPI    Retina Follow Up    Patient presents with  Diabetic Retinopathy.  In left eye.  Severity is moderate.  Duration of 11 weeks.  Since onset it is stable.  I, the attending physician,  performed the HPI with the patient and updated documentation appropriately. Additional comments: 11 Week PDR f\u OS. Possible Eylea OS. OCT Pt states vision is a little better in OD. Denies new complaints. BGL: 155       Last edited by Tilda Franco on 11/12/2020  3:23 PM. (History)      Referring physician: Unk Pinto, MD [address] [place],  [place] [address]  HISTORICAL INFORMATION:   Selected notes from the MEDICAL RECORD NUMBER    Lab Results  Component Value Date   HGBA1C 6.9 (H) 05/23/2020     CURRENT MEDICATIONS: No current outpatient medications on file. (Ophthalmic Drugs)   No current facility-administered medications for this visit. (Ophthalmic Drugs)   Current Outpatient Medications (Other)  Medication Sig  . Ascorbic Acid (VITAMIN C PO) Take 1 tablet by mouth daily.  . Calcium Citrate-Vitamin D (CITRACAL/VITAMIN D PO) Take 1 tablet by mouth daily.  . Cholecalciferol (VITAMIN D PO) Take 1,000 Units by mouth 2 (two) times daily. Reported on 02/07/2016  . DULoxetine (CYMBALTA) 30 MG capsule Take 1 capsule (30 mg total) by mouth daily.  Marland Kitchen glimepiride (AMARYL) 2 MG tablet Take     1 tablet    2 x /day with Meals     For Diabetes  . glucose blood (ACCU-CHEK GUIDE) test strip Check blood sugar 3 times a day-DX-E11.69 AND E78.5.  . Magnesium 250 MG TABS Take 2 tablets by mouth daily.   . metFORMIN (GLUCOPHAGE-XR) 500 MG 24 hr tablet TAKE 2 TABLETS 2 X /DAY  WITH MEALS FOR DIABETES  . Omega-3 Fatty Acids (OMEGA-3 FISH OIL PO) Take 1 capsule by mouth daily.  Marland Kitchen zinc gluconate 50 MG tablet Take 50 mg by mouth daily.   No current facility-administered medications for this visit. (Other)      REVIEW OF SYSTEMS: ROS    Positive for: Endocrine   Last edited by Tilda Franco on 11/12/2020  3:23 PM. (History)       ALLERGIES Allergies  Allergen Reactions  . Gabapentin Swelling    Swelling in legs and feet    PAST MEDICAL HISTORY Past Medical History:  Diagnosis Date  . Abnormality of gait 07/28/2013  . Diabetes ([place])   . Diabetic peripheral neuropathy ([place])   . Diabetic retinopathy ([place])   . Foot drop, bilateral 07/31/2014  . Peripheral edema   . Polyneuropathy in diabetes(357.2) 07/28/2013   Past Surgical History:  Procedure Laterality Date  . ABDOMINAL HYSTERECTOMY    . ANKLE FRACTURE SURGERY     left  . CATARACT EXTRACTION W/PHACO Right 02/21/2014   Dr. Herbert Deaner  . CATARACT EXTRACTION W/PHACO Left 04/10/2014   Dr. Herbert Deaner  . CATARACT EXTRACTION, BILATERAL    . HIP SURGERY     right  . TONSILLECTOMY      FAMILY HISTORY Family History  Problem Relation Age of Onset  . Diabetes Mother   .  Diabetes Brother   . Neuropathy Neg Hx     SOCIAL HISTORY Social History   Tobacco Use  . Smoking status: Never Smoker  . Smokeless tobacco: Never Used  Substance Use Topics  . Alcohol use: No  . Drug use: No         OPHTHALMIC EXAM: Base Eye Exam    Visual Acuity (Snellen - Linear)      Right Left   Dist [place] 20/50 -1 20/80 -1   Dist ph [place] 20/30 -1 20/60       Tonometry (Tonopen, 3:28 PM)      Right Left   Pressure 15 15       Pupils      Pupils Dark Light Shape React APD   Right PERRL 3 3 Round Minimal None   Left PERRL 3 3 Round Minimal None       Visual Fields (Counting fingers)      Left Right    Full Full       Neuro/Psych    Oriented x3: Yes   Mood/Affect: Normal       Dilation    Left  eye: 1.0% Mydriacyl, 2.5% Phenylephrine @ 3:28 PM        Slit Lamp and Fundus Exam    External Exam      Right Left   External Normal Normal       Slit Lamp Exam      Right Left   Lids/Lashes Normal Normal   Conjunctiva/Sclera White and quiet White and quiet   Cornea Clear Clear   Anterior Chamber Deep and quiet Deep and quiet   Iris Round and reactive Round and reactive   Lens Posterior chamber intraocular lens Posterior chamber intraocular lens   Anterior Vitreous Normal Normal       Fundus Exam      Right Left   Posterior Vitreous  Posterior vitreous detachment   Disc  Normal   C/D Ratio  0.25   Macula  Microaneurysms, Macular thickening, Clinically significant macular edema, Mild clinically significant macular edema   Vessels  Severe nonproliferative diabetic retinopathy with retinal nonperfusion   Periphery  Good peripheral PRP          IMAGING AND PROCEDURES  Imaging and Procedures for 11/12/20  OCT, Retina - OU - Both Eyes       Right Eye Quality was good. Scan locations included subfoveal. Central Foveal Thickness: 289. Findings include epiretinal membrane.   Left Eye Quality was good. Scan locations included subfoveal. Central Foveal Thickness: 342.   Notes CSME has continued to remain stable and even improved at 1-week interval in the right eye post injection.  OS, today at 11-week follow-up with minor CSME center involved recurrent yet still active.  Overall improved with longer duration follow-up intervals now coincident with significant improved compliance and tolerability with Nighttime CPAP use       Intravitreal Injection, Pharmacologic Agent - OS - Left Eye       Time Out 11/12/2020. 3:43 PM. Confirmed correct patient, procedure, site, and patient consented.   Anesthesia Topical anesthesia was used. Anesthetic medications included [place] 3.5%.   Procedure Preparation included Tobramycin 0.3%, 5% betadine to ocular surface, 10% betadine  to eyelids, Ofloxacin . A 30 gauge needle was used.   Injection:  2 mg aflibercept [name]) SOLN   NDC: 81191-478-29   Route: Intravitreal, Site: Left Eye, Waste: 0 mg  Post-op Post injection exam found visual acuity of at least  counting fingers. The patient tolerated the procedure well. There were no complications. The patient received written and verbal post procedure care education. Post injection medications were not given.                 ASSESSMENT/PLAN:  Proliferative diabetic retinopathy of left eye with macular edema associated with type 2 diabetes mellitus (HCC) Slight increase in CSME center involved at 11-week follow-up.  Repeat injection today and examination again left eye in 9 weeks      ICD-10-CM   1. Proliferative diabetic retinopathy of left eye with macular edema associated with type 2 diabetes mellitus (HCC)  E75.1700 OCT, Retina - OU - Both Eyes    Intravitreal Injection, Pharmacologic Agent - OS - Left Eye    aflibercept (EYLEA) SOLN 2 mg    1.  Overall CSME continues to improve left eye and longer duration follow-up periods with less center involved CSME recurrence.  We will repeat injection Eylea today and follow-up again in 9 weeks  2. OD, follow-up as scheduled  3.  Ophthalmic Meds Ordered this visit:  Meds ordered this encounter  Medications  . aflibercept (EYLEA) SOLN 2 mg       Return in about 9 weeks (around 01/14/2021) for dilate, OS, EYLEA OCT.  There are no Patient Instructions on file for this visit.   Explained the diagnoses, plan, and follow up with the patient and they expressed understanding.  Patient expressed understanding of the importance of proper follow up care.   Clent Demark Aili Casillas M.D. Diseases & Surgery of the Retina and Vitreous [facility] 11/12/20     Abbreviations: M myopia (nearsighted); A astigmatism; H hyperopia (farsighted); P presbyopia; Mrx spectacle prescription;  CTL contact lenses; OD  right eye; OS left eye; OU both eyes  XT exotropia; ET esotropia; PEK punctate epithelial keratitis; PEE punctate epithelial erosions; DES dry eye syndrome; MGD meibomian gland dysfunction; ATs artificial tears; PFAT's preservative free artificial tears; [place] nuclear sclerotic cataract; PSC posterior subcapsular cataract; ERM epi-retinal membrane; PVD posterior vitreous detachment; RD retinal detachment; DM diabetes mellitus; DR diabetic retinopathy; NPDR non-proliferative diabetic retinopathy; PDR proliferative diabetic retinopathy; CSME clinically significant macular edema; DME diabetic macular edema; dbh dot blot hemorrhages; CWS cotton wool spot; POAG primary open angle glaucoma; C/D cup-to-disc ratio; HVF humphrey visual field; GVF goldmann visual field; OCT optical coherence tomography; IOP intraocular pressure; BRVO Branch retinal vein occlusion; CRVO central retinal vein occlusion; CRAO central retinal artery occlusion; BRAO branch retinal artery occlusion; RT retinal tear; SB scleral buckle; PPV pars plana vitrectomy; VH Vitreous hemorrhage; PRP panretinal laser photocoagulation; IVK intravitreal kenalog; VMT vitreomacular traction; MH Macular hole;  NVD neovascularization of the disc; NVE neovascularization elsewhere; AREDS age related eye disease study; ARMD age related macular degeneration; POAG primary open angle glaucoma; EBMD epithelial/anterior basement membrane dystrophy; ACIOL anterior chamber intraocular lens; IOL intraocular lens; PCIOL posterior chamber intraocular lens; Phaco/IOL phacoemulsification with intraocular lens placement; [place] photorefractive keratectomy; LASIK laser assisted in situ keratomileusis; HTN hypertension; DM diabetes mellitus; COPD chronic obstructive pulmonary disease

## 2020-12-05 ENCOUNTER — Encounter: Payer: Self-pay | Admitting: Internal Medicine

## 2020-12-05 ENCOUNTER — Ambulatory Visit (INDEPENDENT_AMBULATORY_CARE_PROVIDER_SITE_OTHER): Payer: Medicare Other | Admitting: Internal Medicine

## 2020-12-05 ENCOUNTER — Other Ambulatory Visit: Payer: Self-pay

## 2020-12-05 VITALS — BP 140/88 | HR 69 | Temp 97.5°F | Resp 16 | Ht 64.5 in | Wt 173.6 lb

## 2020-12-05 DIAGNOSIS — E559 Vitamin D deficiency, unspecified: Secondary | ICD-10-CM | POA: Diagnosis not present

## 2020-12-05 DIAGNOSIS — Z9989 Dependence on other enabling machines and devices: Secondary | ICD-10-CM

## 2020-12-05 DIAGNOSIS — Z79899 Other long term (current) drug therapy: Secondary | ICD-10-CM

## 2020-12-05 DIAGNOSIS — E08311 Diabetes mellitus due to underlying condition with unspecified diabetic retinopathy with macular edema: Secondary | ICD-10-CM | POA: Diagnosis not present

## 2020-12-05 DIAGNOSIS — E1122 Type 2 diabetes mellitus with diabetic chronic kidney disease: Secondary | ICD-10-CM

## 2020-12-05 DIAGNOSIS — E1169 Type 2 diabetes mellitus with other specified complication: Secondary | ICD-10-CM | POA: Diagnosis not present

## 2020-12-05 DIAGNOSIS — M21371 Foot drop, right foot: Secondary | ICD-10-CM | POA: Diagnosis not present

## 2020-12-05 DIAGNOSIS — M21372 Foot drop, left foot: Secondary | ICD-10-CM | POA: Diagnosis not present

## 2020-12-05 DIAGNOSIS — E785 Hyperlipidemia, unspecified: Secondary | ICD-10-CM | POA: Diagnosis not present

## 2020-12-05 DIAGNOSIS — N1831 Chronic kidney disease, stage 3a: Secondary | ICD-10-CM

## 2020-12-05 DIAGNOSIS — I10 Essential (primary) hypertension: Secondary | ICD-10-CM

## 2020-12-05 DIAGNOSIS — E0842 Diabetes mellitus due to underlying condition with diabetic polyneuropathy: Secondary | ICD-10-CM

## 2020-12-05 DIAGNOSIS — G4733 Obstructive sleep apnea (adult) (pediatric): Secondary | ICD-10-CM

## 2020-12-05 NOTE — Progress Notes (Signed)
Future Appointments  Date Time Provider [place]  12/05/2020  9:30 AM Unk Pinto, MD [facility] None  01/22/2021 11:15 AM Suzzanne Cloud, NP [facility] None  06/06/2021   CPE 11:00 AM Unk Pinto, MD [facility] None    History of Present Illness:       This very nice 70 y.o. MWF presents for 6 month follow up with HTN, HLD, T2_NIDDM and Vitamin D Deficiency. Patient is on CPAP for OSA with improved sleep hygiene.        Patient is treated for HTN (1960's) & BP has been controlled at home. Today's BP= is at goal - 140/88. Patient has had no complaints of any cardiac type chest pain, palpitations, dyspnea / orthopnea / PND, dizziness, claudication, or dependent edema.       Hyperlipidemia is controlled with diet & meds. Patient denies myalgias or other med SE's. Last Lipids were near goal  (LDL 100), but elevated Trig's 191:  Lab Results  Component Value Date   CHOL 185 05/23/2020   HDL 54 05/23/2020   LDLCALC 100 (H) 05/23/2020   TRIG 191 (H) 05/23/2020   CHOLHDL 3.4 05/23/2020     Also, the patient has history of gestationakl DM in 1980 and then honeymooned til  Started on Metformin for T2_NIDDM (2006) w/CKD3a  (GFR 52) and  Patient has Peripheral sensory Neuropathy w/bilat foot drop and Diab Retinopathy, Wet Mac Degeneration managed by Dr [name]. She reports poor vision bilat & worse on the Lt>RT , but stable or about the same over the last 6 months.  She has had no symptoms of reactive hypoglycemia or diabetic polys.  Last A1c was not at goal:  Lab Results  Component Value Date   HGBA1C 6.9 (H) 05/23/2020            Further, the patient also has history of Vitamin D Deficiency ("26" /2016) and supplements vitamin D without any suspected side-effects. Last vitamin D was at goal:  Lab Results  Component Value Date   VD25OH 78 05/23/2020    Current Outpatient Medications on File Prior to Visit  Medication Sig  . VITAMIN C Take 1 tablet daily.  Marland Kitchen  CITRACAL/VITAMIN D  Take 1 tablet daily.  Marland Kitchen VITAMIN D  5,000 Units  Take daily.  . CYMBALTA 30 MG capsule Take 1 capsule  daily.  Marland Kitchen glimepiride  2 MG tablet Take     1 tablet    2 x /day with Meals       . Magnesium 250 MG TABS Take 2 tablets  daily.   . metFORMIN-XR) 500 MG TAKE 2 TABLETS 2 X /DAY WITH MEALS   . OMEGA-3 FISH OIL  Take 1 capsule  daily.  Marland Kitchen zinc 50 MG tablet Take `daily.     Allergies  Allergen Reactions  . Gabapentin Swelling    Swelling in legs and feet    PMHx:   Past Medical History:  Diagnosis Date  . Abnormality of gait 07/28/2013  . Diabetes ([place])   . Diabetic peripheral neuropathy ([place])   . Diabetic retinopathy ([place])   . Foot drop, bilateral 07/31/2014  . Peripheral edema   . Polyneuropathy in diabetes(357.2) 07/28/2013    Immunization History  Administered Date(s) Administered  . Influenza, High Dose Seasonal PF 05/12/2016, 07/03/2017, 05/24/2018, 06/10/2019  . Influenza-Unspecified 07/03/2017  . Pneumococcal Conjugate-13 02/07/2016  . Pneumococcal Polysaccharide-23 09/03/2017  . Td 02/07/2016    Past Surgical History:  Procedure  Laterality Date  . ABDOMINAL HYSTERECTOMY    . ANKLE FRACTURE SURGERY     left  . CATARACT EXTRACTION W/PHACO Right 02/21/2014   Dr. Herbert Deaner  . CATARACT EXTRACTION W/PHACO Left 04/10/2014   Dr. Herbert Deaner  . CATARACT EXTRACTION, BILATERAL    . HIP SURGERY     right  . TONSILLECTOMY      FHx:    Reviewed / unchanged  SHx:    Reviewed / unchanged   Systems Review:  Constitutional: Denies fever, chills, wt changes, headaches, insomnia, fatigue, night sweats, change in appetite. Eyes:  Has poor vision,  Otherwise denies redness, diplopia, discharge, itchy, watery eyes.  ENT: Denies discharge, congestion, post nasal drip, epistaxis, sore throat, earache, hearing loss, dental pain, tinnitus, vertigo, sinus pain, snoring.  CV: Denies chest pain, palpitations, irregular heartbeat, syncope, dyspnea, diaphoresis,  orthopnea, PND, claudication or edema. Respiratory: denies cough, dyspnea, DOE, pleurisy, hoarseness, laryngitis, wheezing.  Gastrointestinal: Denies dysphagia, odynophagia, heartburn, reflux, water brash, abdominal pain or cramps, nausea, vomiting, bloating, diarrhea, constipation, hematemesis, melena, hematochezia  or hemorrhoids. Genitourinary: Denies dysuria, frequency, urgency, nocturia, hesitancy, discharge, hematuria or flank pain. Musculoskeletal: Denies arthralgias, myalgias, stiffness, jt. swelling, pain, limping or strain/sprain.  Skin: Denies pruritus, rash, hives, warts, acne, eczema or change in skin lesion(s). Neuro: No weakness, tremor, incoordination, spasms, paresthesia or pain. Psychiatric: Denies confusion, memory loss or sensory loss. Endo: Denies change in weight, skin or hair change.  Heme/Lymph: No excessive bleeding, bruising or enlarged lymph nodes.  Physical Exam  BP 140/88   Pulse 69   Temp (!) 97.5 F (36.4 C)   Resp 16   Ht 5' 4.5" (1.638 m)   Wt 173 lb 9.6 oz (78.7 kg)   SpO2 98%   BMI 29.34 kg/m   Appears overnourished, well groomed  and in no distress.  Eyes: Pupils constricted and equal & fundi not well visualized. EOMs conjugate.  conjunctiva - no  erythema. Sinuses: No frontal/maxillary tenderness ENT/Mouth: EAC's clear, TM's nl w/o erythema, bulging. Nares clear w/o erythema, swelling, exudates. Oropharynx clear without erythema or exudates. Oral hygiene is good. Tongue normal, non obstructing. Hearing intact.  Neck: Supple. Thyroid not palpable. Car 2+/2+ without bruits, nodes or JVD. Chest: Respirations nl with BS clear & equal w/o rales, rhonchi, wheezing or stridor.  Cor: Heart sounds normal w/ regular rate and rhythm without sig. murmurs, gallops, clicks or rubs. Peripheral pulses normal and equal  without edema.  Abdomen: Soft, flat  & bowel sounds normal. Non-tender w/o guarding, rebound, hernias, masses or organomegaly.  Lymphatics:  Unremarkable.  Musculoskeletal: Full ROM all peripheral extremities, with generalized muscular decreased tone and bulk.   Uses a walker with a broad based gait. gait.  Skin: Warm, dry without exposed rashes, lesions or ecchymosis apparent.  Neuro: Cranial nerves intact, reflexes equal bilaterally. Sensory-motor testing grossly intact. Tendon reflexes grossly intact.  Pysch: Alert & oriented x 3.  Insight and judgement nl & appropriate. No ideations.  Assessment and Plan:  1. Essential hypertension  - Continue medication, monitor blood pressure at home.  - Continue DASH diet.  Reminder to go to the ER if any CP,  SOB, nausea, dizziness, severe HA, changes vision/speech.  - CBC with Differential/Platelet - COMPLETE METABOLIC PANEL WITH GFR - Magnesium - TSH  2. Hyperlipidemia associated with type 2 diabetes mellitus ([place])  - Continue diet/meds, exercise,& lifestyle modifications.  - Continue monitor periodic cholesterol/liver & renal functions   - Lipid panel - TSH  3. Type 2  diabetes mellitus with stage 3a chronic kidney  disease, without long-term current use of insulin (HCC)  - Continue diet, exercise  - Lifestyle modifications.  - Monitor appropriate labs  - Hemoglobin A1c - Insulin, random  4. Vitamin D deficiency  - Continue supplementation.  - VITAMIN D 25 Hydroxy   5. Diabetic polyneuropathy due to  diabetes mellitus   ([place])  - Hemoglobin A1c  6. Foot drop, bilateral   7. Diabetic retinopathy of both eyes with macular edema  (HCC)  - Hemoglobin A1c  8. OSA on CPAP   9. Medication management  - CBC with Differential/Platelet - COMPLETE METABOLIC PANEL WITH GFR - Magnesium - Lipid panel - TSH - Hemoglobin A1c - Insulin, random - VITAMIN D 25 Hydroxy         Discussed  regular exercise, BP monitoring, weight control to achieve/maintain BMI less than 25 and discussed med and SE's. Recommended labs to assess and monitor clinical status with further  disposition pending results of labs.  I discussed the assessment and treatment plan with the patient. The patient was provided an opportunity to ask questions and all were answered. The patient agreed with the plan and demonstrated an understanding of the instructions.  I provided over 30 minutes of exam, counseling, chart review and  complex critical decision making.        The patient was advised to call back or seek an in-person evaluation if the symptoms worsen or if the condition fails to improve as anticipated.   Kirtland Bouchard, MD

## 2020-12-05 NOTE — Patient Instructions (Signed)

## 2020-12-06 LAB — CBC WITH DIFFERENTIAL/PLATELET
Absolute Monocytes: 595 cells/uL (ref 200–950)
Basophils Absolute: 63 cells/uL (ref 0–200)
Basophils Relative: 0.9 %
Eosinophils Absolute: 294 cells/uL (ref 15–500)
Eosinophils Relative: 4.2 %
HCT: 38.8 % (ref 35.0–45.0)
Hemoglobin: 12.7 g/dL (ref 11.7–15.5)
Lymphs Abs: 1813 cells/uL (ref 850–3900)
MCH: 31.5 pg (ref 27.0–33.0)
MCHC: 32.7 g/dL (ref 32.0–36.0)
MCV: 96.3 fL (ref 80.0–100.0)
MPV: 9.4 fL (ref 7.5–12.5)
Monocytes Relative: 8.5 %
Neutro Abs: 4235 cells/uL (ref 1500–7800)
Neutrophils Relative %: 60.5 %
Platelets: 379 10*3/uL (ref 140–400)
RBC: 4.03 10*6/uL (ref 3.80–5.10)
RDW: 12.8 % (ref 11.0–15.0)
Total Lymphocyte: 25.9 %
WBC: 7 10*3/uL (ref 3.8–10.8)

## 2020-12-06 LAB — COMPLETE METABOLIC PANEL WITH GFR
AG Ratio: 1.9 (calc) (ref 1.0–2.5)
ALT: 23 U/L (ref 6–29)
AST: 17 U/L (ref 10–35)
Albumin: 4 g/dL (ref 3.6–5.1)
Alkaline phosphatase (APISO): 52 U/L (ref 37–153)
BUN/Creatinine Ratio: 22 (calc) (ref 6–22)
BUN: 21 mg/dL (ref 7–25)
CO2: 32 mmol/L (ref 20–32)
Calcium: 10.5 mg/dL — ABNORMAL HIGH (ref 8.6–10.4)
Chloride: 101 mmol/L (ref 98–110)
Creat: 0.97 mg/dL — ABNORMAL HIGH (ref 0.60–0.93)
GFR, Est African American: 69 mL/min/{1.73_m2} (ref >=60)
GFR, Est Non African American: 59 mL/min/{1.73_m2} — ABNORMAL LOW (ref >=60)
Globulin: 2.1 g/dL (calc) (ref 1.9–3.7)
Glucose, Bld: 134 mg/dL — ABNORMAL HIGH (ref 65–99)
Potassium: 5.3 mmol/L (ref 3.5–5.3)
Sodium: 142 mmol/L (ref 135–146)
Total Bilirubin: 0.3 mg/dL (ref 0.2–1.2)
Total Protein: 6.1 g/dL (ref 6.1–8.1)

## 2020-12-06 LAB — INSULIN, RANDOM: Insulin: 24.9 u[IU]/mL — ABNORMAL HIGH

## 2020-12-06 LAB — VITAMIN D 25 HYDROXY (VIT D DEFICIENCY, FRACTURES): Vit D, 25-Hydroxy: 73 ng/mL (ref 30–100)

## 2020-12-06 LAB — LIPID PANEL
Cholesterol: 192 mg/dL (ref <200)
HDL: 55 mg/dL (ref >=50)
LDL Cholesterol (Calc): 88 mg/dL (calc)
Non-HDL Cholesterol (Calc): 137 mg/dL (calc) — ABNORMAL HIGH (ref <130)
Total CHOL/HDL Ratio: 3.5 (calc) (ref <5.0)
Triglycerides: 369 mg/dL — ABNORMAL HIGH (ref <150)

## 2020-12-06 LAB — HEMOGLOBIN A1C
Hgb A1c MFr Bld: 7.5 % of total Hgb — ABNORMAL HIGH (ref <5.7)
Mean Plasma Glucose: 169 mg/dL
eAG (mmol/L): 9.3 mmol/L

## 2020-12-06 LAB — MAGNESIUM: Magnesium: 1.5 mg/dL (ref 1.5–2.5)

## 2020-12-06 LAB — TSH: TSH: 2.48 mIU/L (ref 0.40–4.50)

## 2020-12-07 NOTE — Progress Notes (Signed)
============================================================ ============================================================  -  CBC - Normal & OK   - Kidney Functions Stage 3 - actually are about 10-12 % improved  -  Magnesium  -   1.5   -  very  low- goal is betw 2.0 - 2.5,   - So..............Marland Kitchen  Recommend that you  INCREASE your                                                                  Magnesium 500 mg  to 2 tablets /day    - also important to eat lots of  leafy green vegetables   - spinach - Kale - collards - greens - okra - asparagus  - broccoli - quinoa - squash - almonds   - black, red, white beans  -  peas - green beans ============================================================ ============================================================  - Total Chol = 192  and LDL Chol = 88 - Both  Great   - Low risk for Heart Attack  / Stroke ============================================================ ============================================================  - But , Triglycerides (   369   ) or fats in blood are too high  (goal is less than 150)    - Recommend avoid fried & greasy foods,  sweets / candy,   - Avoid white rice  (brown or wild rice or Quinoa is OK),   - Avoid white potatoes  (sweet potatoes are OK)   - Avoid anything made from white flour  - bagels, doughnuts, rolls, buns, biscuits, white and   wheat breads, pizza crust and traditional  pasta made of white flour & egg white  - (vegetarian pasta or spinach or wheat pasta is OK).    - Multi-grain bread is OK - like multi-grain flat bread or  sandwich thins.   - Avoid alcohol in excess.   - Exercise is also important. ============================================================ ============================================================  - A1c is a little worse - Gone up from 6.9% to Now 7.5%                                  - Recommend keep meds same & work harder with Diet    Being  diabetic has a  300% increased risk for heart attack,                                      stroke, cancer,and Azheimer- type vascular dementia.      Avoid\ all foods that are white except chicken,   fish & calliflower.  - Avoid white rice  (brown & wild rice is OK),   - Avoid white potatoes  (sweet potatoes in moderation is OK),   White bread or wheat bread or anything made out of   white flour like bagels, donuts, rolls, buns, biscuits, cakes,  - pastries, cookies, pizza crust, and pasta (made from  white flour & egg whites)   - vegetarian pasta or spinach or wheat pasta is OK.  - Multigrain breads like Arnold's, Pepperidge Farm or   multigrain sandwich thins or high fiber breads like   [place] bread or "Dave's Killer" breads that are  4 to 5 grams  fiber per slice !  are best.     - Diet, exercise and weight loss is very important in the   control and prevention of complications of diabetes which  affects every system in your body, ie.   -Brain - dementia/stroke,  - eyes - glaucoma/blindness,  - heart - heart attack/heart failure,  - kidneys - dialysis,  - stomach - gastric paralysis,  - intestines - malabsorption,  - nerves - severe painful neuritis,  - circulation - gangrene & loss of a leg(s)  - and finally  . . . . . . . . . . . . . . . . . .    - cancer and Alzheimers. ============================================================ ============================================================  - Vitamin D = 73 - Excellent - Please keep dose same ============================================================ ============================================================  - All Else - CBC - Electrolytes - Liver & Thyroid     - all  Normal / OK ============================================================ ============================================================  - ===========================================================

## 2020-12-08 ENCOUNTER — Encounter: Payer: Self-pay | Admitting: Internal Medicine

## 2020-12-24 ENCOUNTER — Encounter (INDEPENDENT_AMBULATORY_CARE_PROVIDER_SITE_OTHER): Payer: Self-pay | Admitting: Ophthalmology

## 2020-12-24 ENCOUNTER — Ambulatory Visit (INDEPENDENT_AMBULATORY_CARE_PROVIDER_SITE_OTHER): Payer: Medicare Other | Admitting: Ophthalmology

## 2020-12-24 ENCOUNTER — Other Ambulatory Visit: Payer: Self-pay

## 2020-12-24 DIAGNOSIS — E113512 Type 2 diabetes mellitus with proliferative diabetic retinopathy with macular edema, left eye: Secondary | ICD-10-CM

## 2020-12-24 DIAGNOSIS — E1151 Type 2 diabetes mellitus with diabetic peripheral angiopathy without gangrene: Secondary | ICD-10-CM | POA: Diagnosis not present

## 2020-12-24 DIAGNOSIS — H35371 Puckering of macula, right eye: Secondary | ICD-10-CM

## 2020-12-24 DIAGNOSIS — E113511 Type 2 diabetes mellitus with proliferative diabetic retinopathy with macular edema, right eye: Secondary | ICD-10-CM | POA: Diagnosis not present

## 2020-12-24 MED ORDER — AFLIBERCEPT 2MG/0.05ML IZ SOLN FOR KALEIDOSCOPE
2.0000 mg | INTRAVITREAL | Status: AC | PRN
Start: 2020-12-24 — End: 2020-12-24
  Administered 2020-12-24: 2 mg via INTRAVITREAL

## 2020-12-24 NOTE — Assessment & Plan Note (Signed)
Minor no effect on vision

## 2020-12-24 NOTE — Progress Notes (Signed)
12/24/2020     CHIEF COMPLAINT Patient presents for Retina Follow Up (7 week fu OD/ Eylea OD/\Pt states VA OU stable since last visit. Pt denies FOL, floaters, or ocular pain OU. Karie Mainland: 7.5/LBS: 146)   HISTORY OF PRESENT ILLNESS: Grace [name] is a 70 y.o. female who presents to the clinic today for:   HPI    Retina Follow Up    Diagnosis: Diabetic Retinopathy   Laterality: right eye   Onset: 7 weeks ago   Duration: 7 weeks   Course: stable   Comments: 7 week fu OD/ Eylea OD \\Pt  states VA OU stable since last visit. Pt denies FOL, floaters, or ocular pain OU.  A1C: 7.5 LBS: 146       Last edited by Kendra Opitz, COA on 12/24/2020  3:34 PM. (History)      Referring physician: Unk Pinto, MD [address] [place],  [place] [address]  HISTORICAL INFORMATION:   Selected notes from the MEDICAL RECORD NUMBER    Lab Results  Component Value Date   HGBA1C 7.5 (H) 12/05/2020     CURRENT MEDICATIONS: No current outpatient medications on file. (Ophthalmic Drugs)   No current facility-administered medications for this visit. (Ophthalmic Drugs)   Current Outpatient Medications (Other)  Medication Sig  . Ascorbic Acid (VITAMIN C PO) Take 1 tablet by mouth daily.  . Calcium Citrate-Vitamin D (CITRACAL/VITAMIN D PO) Take 1 tablet by mouth daily.  . Cholecalciferol (VITAMIN D PO) Take 5,000 Units by mouth daily.  . DULoxetine (CYMBALTA) 30 MG capsule Take 1 capsule (30 mg total) by mouth daily.  Marland Kitchen glimepiride (AMARYL) 2 MG tablet Take     1 tablet    2 x /day with Meals     For Diabetes  . glucose blood (ACCU-CHEK GUIDE) test strip Check blood sugar 3 times a day-DX-E11.69 AND E78.5.  . Magnesium 250 MG TABS Take 2 tablets by mouth daily.   . metFORMIN (GLUCOPHAGE-XR) 500 MG 24 hr tablet TAKE 2 TABLETS 2 X /DAY WITH MEALS FOR DIABETES  . Omega-3 Fatty Acids (OMEGA-3 FISH OIL PO) Take 1 capsule by mouth daily.  Marland Kitchen zinc gluconate 50 MG tablet Take 50  mg by mouth daily.   No current facility-administered medications for this visit. (Other)      REVIEW OF SYSTEMS:    ALLERGIES Allergies  Allergen Reactions  . Gabapentin Swelling    Swelling in legs and feet    PAST MEDICAL HISTORY Past Medical History:  Diagnosis Date  . Abnormality of gait 07/28/2013  . Diabetes ([place])   . Diabetic peripheral neuropathy ([place])   . Diabetic retinopathy ([place])   . Foot drop, bilateral 07/31/2014  . Peripheral edema   . Polyneuropathy in diabetes(357.2) 07/28/2013   Past Surgical History:  Procedure Laterality Date  . ABDOMINAL HYSTERECTOMY    . ANKLE FRACTURE SURGERY     left  . CATARACT EXTRACTION W/PHACO Right 02/21/2014   Dr. Herbert Deaner  . CATARACT EXTRACTION W/PHACO Left 04/10/2014   Dr. Herbert Deaner  . CATARACT EXTRACTION, BILATERAL    . HIP SURGERY     right  . TONSILLECTOMY      FAMILY HISTORY Family History  Problem Relation Age of Onset  . Diabetes Mother   . Diabetes Brother   . Neuropathy Neg Hx     SOCIAL HISTORY Social History   Tobacco Use  . Smoking status: Never Smoker  . Smokeless tobacco: Never Used  Substance Use Topics  .  Alcohol use: No  . Drug use: No         OPHTHALMIC EXAM:  Base Eye Exam    Visual Acuity (ETDRS)      Right Left   Dist [place] 20/40 20/60 -1   Dist ph [place] 20/30 -2 NI       Tonometry (Tonopen, 3:39 PM)      Right Left   Pressure 12 10       Pupils      Pupils Dark Light Shape React APD   Right PERRL 3 3 Round Minimal None   Left PERRL 3 3 Round Minimal None       Visual Fields (Counting fingers)      Left Right    Full Full       Extraocular Movement      Right Left    Full Full       Neuro/Psych    Oriented x3: Yes   Mood/Affect: Normal       Dilation    Right eye: 1.0% Mydriacyl, 2.5% Phenylephrine @ 3:39 PM        Slit Lamp and Fundus Exam    External Exam      Right Left   External Normal Normal       Slit Lamp Exam      Right Left   Lids/Lashes  Normal Normal   Conjunctiva/Sclera White and quiet White and quiet   Cornea Clear Clear   Anterior Chamber Deep and quiet Deep and quiet   Iris Round and reactive Round and reactive   Lens Posterior chamber intraocular lens Posterior chamber intraocular lens   Anterior Vitreous Normal Normal       Fundus Exam      Right Left   Posterior Vitreous Normal    Disc Normal    C/D Ratio 0.3    Macula Epiretinal membrane, good focal laser, Macular thickening    Vessels PDR-quiet    Periphery good PRP,, ATTACHED           IMAGING AND PROCEDURES  Imaging and Procedures for 12/24/20  OCT, Retina - OU - Both Eyes       Right Eye Quality was good. Scan locations included subfoveal. Central Foveal Thickness: 313. Progression has been stable. Findings include epiretinal membrane, cystoid macular edema.   Left Eye Quality was good. Scan locations included subfoveal. Central Foveal Thickness: 349. Findings include abnormal foveal contour, cystoid macular edema.   Notes Today at 7-week follow-up right eye, repeat injection today and examination next in 7 to 8 weeks         Intravitreal Injection, Pharmacologic Agent - OD - Right Eye       Time Out 12/24/2020. 4:07 PM. Confirmed correct patient, procedure, site, and patient consented.   Anesthesia Topical anesthesia was used. Anesthetic medications included [place] 3.5%.   Procedure Preparation included 10% betadine to eyelids, 5% betadine to ocular surface, Ofloxacin . A 30 gauge needle was used.   Injection:  2 mg aflibercept [name]) SOLN   NDC: [id number], Lot: [id number]   Route: Intravitreal, Site: Right Eye, Waste: 0 mg  Post-op Post injection exam found visual acuity of at least counting fingers. The patient tolerated the procedure well. There were no complications. The patient received written and verbal post procedure care education. Post injection medications were not given.                  ASSESSMENT/PLAN:  Diabetic macular edema of right eye  with proliferative retinopathy associated with type 2 diabetes mellitus ([place]) CSME OD continues to remain stable on 7-week interval post intravitreal Eylea.  Patient remains compliant with CPAP use.  Proliferative diabetic retinopathy of left eye with macular edema associated with type 2 diabetes mellitus ([place]) Follow-up as scheduled in 3 weeks for evaluation left eye      ICD-10-CM   1. Diabetic macular edema of right eye with proliferative retinopathy associated with type 2 diabetes mellitus (HCC)  E11.3511 OCT, Retina - OU - Both Eyes    Intravitreal Injection, Pharmacologic Agent - OD - Right Eye    aflibercept (EYLEA) SOLN 2 mg  2. Proliferative diabetic retinopathy of left eye with macular edema associated with type 2 diabetes mellitus ([place])  H85.2778     1.  Repeat intravitreal Eylea OD today and examination next right eye in 7-week  2.  Dilate OS next in 3 weeks as scheduled and potential injection Eylea at that time  3.  Ophthalmic Meds Ordered this visit:  Meds ordered this encounter  Medications  . aflibercept (EYLEA) SOLN 2 mg       Return in about 7 weeks (around 02/11/2021) for dilate, OD, EYLEA OCT.  There are no Patient Instructions on file for this visit.   Explained the diagnoses, plan, and follow up with the patient and they expressed understanding.  Patient expressed understanding of the importance of proper follow up care.   Clent Demark Naithan Delage M.D. Diseases & Surgery of the Retina and Vitreous Retina & Diabetic [place] 12/24/20     Abbreviations: M myopia (nearsighted); A astigmatism; H hyperopia (farsighted); P presbyopia; Mrx spectacle prescription;  CTL contact lenses; OD right eye; OS left eye; OU both eyes  XT exotropia; ET esotropia; PEK punctate epithelial keratitis; PEE punctate epithelial erosions; DES dry eye syndrome; MGD meibomian gland dysfunction; ATs artificial tears; PFAT's  preservative free artificial tears; [place] nuclear sclerotic cataract; PSC posterior subcapsular cataract; ERM epi-retinal membrane; PVD posterior vitreous detachment; RD retinal detachment; DM diabetes mellitus; DR diabetic retinopathy; NPDR non-proliferative diabetic retinopathy; PDR proliferative diabetic retinopathy; CSME clinically significant macular edema; DME diabetic macular edema; dbh dot blot hemorrhages; CWS cotton wool spot; POAG primary open angle glaucoma; C/D cup-to-disc ratio; HVF humphrey visual field; GVF goldmann visual field; OCT optical coherence tomography; IOP intraocular pressure; BRVO Branch retinal vein occlusion; CRVO central retinal vein occlusion; CRAO central retinal artery occlusion; BRAO branch retinal artery occlusion; RT retinal tear; SB scleral buckle; PPV pars plana vitrectomy; VH Vitreous hemorrhage; PRP panretinal laser photocoagulation; IVK intravitreal kenalog; VMT vitreomacular traction; MH Macular hole;  NVD neovascularization of the disc; NVE neovascularization elsewhere; AREDS age related eye disease study; ARMD age related macular degeneration; POAG primary open angle glaucoma; EBMD epithelial/anterior basement membrane dystrophy; ACIOL anterior chamber intraocular lens; IOL intraocular lens; PCIOL posterior chamber intraocular lens; Phaco/IOL phacoemulsification with intraocular lens placement; [place] photorefractive keratectomy; LASIK laser assisted in situ keratomileusis; HTN hypertension; DM diabetes mellitus; COPD chronic obstructive pulmonary disease

## 2020-12-24 NOTE — Assessment & Plan Note (Signed)
CSME OD continues to remain stable on 7-week interval post intravitreal Eylea.  Patient remains compliant with CPAP use.

## 2020-12-24 NOTE — Assessment & Plan Note (Signed)
Follow-up as scheduled in 3 weeks for evaluation left eye

## 2021-01-15 ENCOUNTER — Encounter (INDEPENDENT_AMBULATORY_CARE_PROVIDER_SITE_OTHER): Payer: Medicare Other | Admitting: Ophthalmology

## 2021-01-22 ENCOUNTER — Encounter: Payer: Self-pay | Admitting: Neurology

## 2021-01-22 ENCOUNTER — Ambulatory Visit (INDEPENDENT_AMBULATORY_CARE_PROVIDER_SITE_OTHER): Payer: Medicare Other | Admitting: Neurology

## 2021-01-22 VITALS — BP 148/67 | HR 69 | Ht 64.0 in | Wt 174.0 lb

## 2021-01-22 DIAGNOSIS — Z9989 Dependence on other enabling machines and devices: Secondary | ICD-10-CM | POA: Diagnosis not present

## 2021-01-22 DIAGNOSIS — G4733 Obstructive sleep apnea (adult) (pediatric): Secondary | ICD-10-CM

## 2021-01-22 DIAGNOSIS — E0842 Diabetes mellitus due to underlying condition with diabetic polyneuropathy: Secondary | ICD-10-CM | POA: Diagnosis not present

## 2021-01-22 MED ORDER — DULOXETINE HCL 30 MG PO CPEP: 30.0000 mg | ORAL_CAPSULE | Freq: Every day | ORAL | 3 refills | Status: DC

## 2021-01-22 NOTE — Progress Notes (Signed)
PATIENT: Grace [name] DOB: Jul 29, 1951  REASON FOR VISIT: follow up HISTORY FROM: patient  HISTORY OF PRESENT ILLNESS: Today 01/22/21  Ms. Grace [name] is a 70 year old female with history of diabetes and peripheral neuropathy.  Doing well on low-dose Cymbalta. On CPAP, visit in October 2021 showed excellent compliance data. A1C 7.5 recently. Neuropathy pain is well controlled, doesn't bother her at all. Getting around well, no falls. Numbness to both feet, no sharp pains. Has bilateral leg braces. Seeing Dr. [name], he felt the CPAP would be helpful for retinopathy, there has been improvement, she can't tell any difference. The CPAP is bothersome to her. Uses 4-5 hours a night. Switched to nasal pillows, some better. The main drive for using CPAP is to improve vision. Hasn't driven in 2 years due to vision, her husband drives her around. ESS 10.  Review of recent CPAP download, indicates excellent compliance, AHI is well treated 3.2, leak is 10.7.    Update 01/19/2020 SS: Ms. Grace [name] is a 70 year old female with history of diabetes and diabetic peripheral neuropathy.  She remains on low-dose Cymbalta 30 mg daily.  Has recently seen Dr. Brett Fairy for evaluation of OSA, pending sleep study.  With neuropathy, she has mostly numbness, no pain per se.  Symptoms are currently well controlled with Cymbalta.  No recent falls.  She wears braces on both lower legs for stability.  She uses a walker.  She does not drive, mostly due to macular degeneration. Most recent A1C 7.7. Presents today for evaluation unaccompanied.  HISTORY  01/17/2019 Dr. Jannifer Franklin: Ms. Grace [name] is a 70 year old right-handed white female with a history of diabetes with diabetic peripheral neuropathy.  Her last hemoglobin A1c was 8.4.  The patient has done fairly well for long time frame on low-dose Cymbalta taking 30 mg daily.  The patient is sleeping well, she does have some gait instability, she wears braces on both feet.  She does  have some numbness of the left index finger but otherwise her hands are unaffected.  She may feel some numbness up to the knee level in the legs.  She does fall on occasion, she may use a cane or a walker when outside of the house.  She uses a shower chair to help prevent falls.  She returns for an evaluation.  REVIEW OF SYSTEMS: Out of a complete 14 system review of symptoms, the patient complains only of the following symptoms, and all other reviewed systems are negative.  See HPI  ALLERGIES: Allergies  Allergen Reactions  . Gabapentin Swelling    Swelling in legs and feet    HOME MEDICATIONS: Outpatient Medications Prior to Visit  Medication Sig Dispense Refill  . Ascorbic Acid (VITAMIN C PO) Take 1 tablet by mouth daily.    . Calcium Citrate-Vitamin D (CITRACAL/VITAMIN D PO) Take 1 tablet by mouth daily.    . Cholecalciferol (VITAMIN D PO) Take 5,000 Units by mouth daily.    Marland Kitchen glimepiride (AMARYL) 2 MG tablet Take     1 tablet    2 x /day with Meals     For Diabetes (Patient taking differently: Take     1 tablet   once with Meals     For Diabetes) 180 tablet 0  . glucose blood (ACCU-CHEK GUIDE) test strip Check blood sugar 3 times a day-DX-E11.69 AND E78.5. 100 each 12  . Magnesium 250 MG TABS Take 2 tablets by mouth daily.     . metFORMIN (GLUCOPHAGE-XR) 500 MG 24 hr tablet  TAKE 2 TABLETS 2 X /DAY WITH MEALS FOR DIABETES 360 tablet 1  . Omega-3 Fatty Acids (OMEGA-3 FISH OIL PO) Take 1 capsule by mouth daily.    Marland Kitchen zinc gluconate 50 MG tablet Take 50 mg by mouth daily.    . DULoxetine (CYMBALTA) 30 MG capsule Take 1 capsule (30 mg total) by mouth daily. 90 capsule 1   No facility-administered medications prior to visit.    PAST MEDICAL HISTORY: Past Medical History:  Diagnosis Date  . Abnormality of gait 07/28/2013  . Diabetes ([place])   . Diabetic peripheral neuropathy ([place])   . Diabetic retinopathy ([place])   . Foot drop, bilateral 07/31/2014  . Peripheral edema   .  Polyneuropathy in diabetes(357.2) 07/28/2013    PAST SURGICAL HISTORY: Past Surgical History:  Procedure Laterality Date  . ABDOMINAL HYSTERECTOMY    . ANKLE FRACTURE SURGERY     left  . CATARACT EXTRACTION W/PHACO Right 02/21/2014   Dr. Herbert Deaner  . CATARACT EXTRACTION W/PHACO Left 04/10/2014   Dr. Herbert Deaner  . CATARACT EXTRACTION, BILATERAL    . HIP SURGERY     right  . TONSILLECTOMY      FAMILY HISTORY: Family History  Problem Relation Age of Onset  . Diabetes Mother   . Diabetes Brother   . Neuropathy Neg Hx     SOCIAL HISTORY: Social History   Socioeconomic History  . Marital status: Married    Spouse name: Not on file  . Number of children: 2  . Years of education: college  . Highest education level: Not on file  Occupational History  . Occupation: part time  Tobacco Use  . Smoking status: Never Smoker  . Smokeless tobacco: Never Used  Substance and Sexual Activity  . Alcohol use: No  . Drug use: No  . Sexual activity: Not on file  Other Topics Concern  . Not on file  Social History Narrative   Patient is right handed.   Patient drinks about 4 cups of tea daily.   Social Determinants of Health   Financial Resource Strain: Not on file  Food Insecurity: Not on file  Transportation Needs: Not on file  Physical Activity: Not on file  Stress: Not on file  Social Connections: Not on file  Intimate Partner Violence: Not on file   PHYSICAL EXAM  Vitals:   01/22/21 1104  BP: (!) 148/67  Pulse: 69  Weight: 174 lb (78.9 kg)  Height: 5\' 4"  (1.626 m)   Body mass index is 29.87 kg/m.  Generalized: Well developed, in no acute distress   Neurological examination  Mentation: Alert oriented to time, place, history taking. Follows all commands speech and language fluent Cranial nerve II-XII: Pupils were equal round reactive to light. Extraocular movements were full, visual field were full on confrontational test. Facial sensation and strength were normal.   Head turning and shoulder shrug were normal and symmetric. Motor: The motor testing reveals 5 over 5 strength of all 4 extremities. Good symmetric motor tone is noted throughout. Braces bilateral LE Sensory: Sensory testing is intact to soft touch on all 4 extremities. No evidence of extinction is noted.  Coordination: Cerebellar testing reveals good finger-nose-finger and heel-to-shin bilaterally.  Gait and station: Gait is slightly wide-based, is cautious, no assistive device Reflexes: Deep tendon reflexes are symmetric but are depressed throughout  DIAGNOSTIC DATA (LABS, IMAGING, TESTING) - I reviewed patient records, labs, notes, testing and imaging myself where available.  Lab Results  Component Value Date  WBC 7.0 12/05/2020   HGB 12.7 12/05/2020   HCT 38.8 12/05/2020   MCV 96.3 12/05/2020   PLT 379 12/05/2020      Component Value Date/Time   NA 142 12/05/2020 0951   K 5.3 12/05/2020 0951   CL 101 12/05/2020 0951   CO2 32 12/05/2020 0951   GLUCOSE 134 (H) 12/05/2020 0951   BUN 21 12/05/2020 0951   CREATININE 0.97 (H) 12/05/2020 0951   CALCIUM 10.5 (H) 12/05/2020 0951   PROT 6.1 12/05/2020 0951   ALBUMIN 3.8 01/27/2017 1609   AST 17 12/05/2020 0951   ALT 23 12/05/2020 0951   ALKPHOS 69 01/27/2017 1609   BILITOT 0.3 12/05/2020 0951   GFRNONAA 59 (L) 12/05/2020 0951   GFRAA 69 12/05/2020 0951   Lab Results  Component Value Date   CHOL 192 12/05/2020   HDL 55 12/05/2020   LDLCALC 88 12/05/2020   TRIG 369 (H) 12/05/2020   CHOLHDL 3.5 12/05/2020   Lab Results  Component Value Date   HGBA1C 7.5 (H) 12/05/2020   No results found for: VITAMINB12 Lab Results  Component Value Date   TSH 2.48 12/05/2020   ASSESSMENT AND PLAN 70 y.o. year old female  has a past medical history of Abnormality of gait (07/28/2013), Diabetes ([place]), Diabetic peripheral neuropathy ([place]), Diabetic retinopathy ([place]), Foot drop, bilateral (07/31/2014), Peripheral edema, and Polyneuropathy in  diabetes(357.2) (07/28/2013). here with:  1.  Diabetic peripheral neuropathy -Well-controlled with Cymbalta, continue 30 mg daily  2.  OSA on CPAP -Download continues to show excellent compliance, will continue, doing mostly to see if diabetic retinopathy improves, close follow-up with Dr. [name] -Addressing CPAP today, then will consolidate to annual follow-up for both conditions, send order for supplies today  I spent 30 minutes of face-to-face and non-face-to-face time with patient.  This included previsit chart review, lab review, reviewing medications, CPAP download, discussing plan and follow-up.   Butler Denmark, AGNP-C, DNP 01/22/2021, 11:36 AM [facility] [address] [place], [place] [address] [phone]

## 2021-01-22 NOTE — Progress Notes (Signed)
I have read the note, and I agree with the clinical assessment and plan.  Ari Engelbrecht K Maclovia Uher   

## 2021-01-22 NOTE — Patient Instructions (Signed)
See you back in 1 year  Continue Cymbalta and CPAP

## 2021-02-03 ENCOUNTER — Other Ambulatory Visit: Payer: Self-pay | Admitting: Internal Medicine

## 2021-02-11 ENCOUNTER — Encounter (INDEPENDENT_AMBULATORY_CARE_PROVIDER_SITE_OTHER): Payer: Self-pay | Admitting: Ophthalmology

## 2021-02-11 ENCOUNTER — Ambulatory Visit (INDEPENDENT_AMBULATORY_CARE_PROVIDER_SITE_OTHER): Payer: Medicare Other | Admitting: Ophthalmology

## 2021-02-11 ENCOUNTER — Other Ambulatory Visit: Payer: Self-pay

## 2021-02-11 DIAGNOSIS — E113512 Type 2 diabetes mellitus with proliferative diabetic retinopathy with macular edema, left eye: Secondary | ICD-10-CM | POA: Diagnosis not present

## 2021-02-11 DIAGNOSIS — E113511 Type 2 diabetes mellitus with proliferative diabetic retinopathy with macular edema, right eye: Secondary | ICD-10-CM

## 2021-02-11 DIAGNOSIS — H35371 Puckering of macula, right eye: Secondary | ICD-10-CM

## 2021-02-11 MED ORDER — AFLIBERCEPT 2MG/0.05ML IZ SOLN FOR KALEIDOSCOPE
2.0000 mg | INTRAVITREAL | Status: AC | PRN
  Administered 2021-02-11: 16:00:00 2 mg via INTRAVITREAL

## 2021-02-11 NOTE — Progress Notes (Signed)
02/11/2021     CHIEF COMPLAINT Patient presents for Retina Follow Up (7 week fu OD and Eylea OD/Pt states, "I really do think that my vision is worse.It seems to be more blurred." /A1C: 7.5  LBS: 098)   HISTORY OF PRESENT ILLNESS: Grace [name] is a 70 y.o. female who presents to the clinic today for:   HPI     Retina Follow Up           Diagnosis: Wet AMD   Laterality: right eye   Onset: 7 weeks ago   Severity: mild   Duration: 7 weeks   Comments: 7 week fu OD and Eylea OD Pt states, "I really do think that my vision is worse.It seems to be more blurred."  A1C: 7.5  LBS: 098       Last edited by Kendra Opitz, COA on 02/11/2021  3:38 PM.      Referring physician: Unk Pinto, MD [address] [place],  [place] [address]  HISTORICAL INFORMATION:   Selected notes from the MEDICAL RECORD NUMBER    Lab Results  Component Value Date   HGBA1C 7.5 (H) 12/05/2020     CURRENT MEDICATIONS: No current outpatient medications on file. (Ophthalmic Drugs)   No current facility-administered medications for this visit. (Ophthalmic Drugs)   Current Outpatient Medications (Other)  Medication Sig   Ascorbic Acid (VITAMIN C PO) Take 1 tablet by mouth daily.   Calcium Citrate-Vitamin D (CITRACAL/VITAMIN D PO) Take 1 tablet by mouth daily.   Cholecalciferol (VITAMIN D PO) Take 5,000 Units by mouth daily.   DULoxetine (CYMBALTA) 30 MG capsule Take 1 capsule (30 mg total) by mouth daily.   glimepiride (AMARYL) 2 MG tablet Take  1 tablet  2 x /day  with Meals  for Diabetes   glucose blood (ACCU-CHEK GUIDE) test strip Check blood sugar 3 times a day-DX-E11.69 AND E78.5.   Magnesium 250 MG TABS Take 2 tablets by mouth daily.    metFORMIN (GLUCOPHAGE-XR) 500 MG 24 hr tablet TAKE 2 TABLETS 2 X /DAY WITH MEALS FOR DIABETES   Omega-3 Fatty Acids (OMEGA-3 FISH OIL PO) Take 1 capsule by mouth daily.   zinc gluconate 50 MG tablet Take 50 mg by mouth daily.    No current facility-administered medications for this visit. (Other)      REVIEW OF SYSTEMS:    ALLERGIES Allergies  Allergen Reactions   Gabapentin Swelling    Swelling in legs and feet    PAST MEDICAL HISTORY Past Medical History:  Diagnosis Date   Abnormality of gait 07/28/2013   Diabetes ([place])    Diabetic peripheral neuropathy (HCC)    Diabetic retinopathy ([place])    Foot drop, bilateral 07/31/2014   Peripheral edema    Polyneuropathy in diabetes(357.2) 07/28/2013   Past Surgical History:  Procedure Laterality Date   ABDOMINAL HYSTERECTOMY     ANKLE FRACTURE SURGERY     left   CATARACT EXTRACTION W/PHACO Right 02/21/2014   Dr. Herbert Deaner   CATARACT EXTRACTION W/PHACO Left 04/10/2014   Dr. Herbert Deaner   CATARACT EXTRACTION, [place]     right   TONSILLECTOMY      FAMILY HISTORY Family History  Problem Relation Age of Onset   Diabetes Mother    Diabetes Brother    Neuropathy Neg Hx     SOCIAL HISTORY Social History   Tobacco Use   Smoking status: Never   Smokeless tobacco: Never  Substance Use Topics   Alcohol use: No   Drug use: No         OPHTHALMIC EXAM:  Base Eye Exam     Visual Acuity (ETDRS)       Right Left   Dist [place] 20/50 -2 20/80   Dist ph [place] 20/40 +2 20/60 -1         Tonometry (Tonopen, 3:43 PM)       Right Left   Pressure 16 14         Pupils       Pupils Dark Light Shape React APD   Right PERRL 3 3 Round Minimal None   Left PERRL 3 3 Round Minimal None         Visual Fields (Counting fingers)       Left Right    Full Full         Extraocular Movement       Right Left    Full Full         Neuro/Psych     Oriented x3: Yes   Mood/Affect: Normal         Dilation     Right eye: 1.0% Mydriacyl, 2.5% Phenylephrine @ 3:43 PM           Slit Lamp and Fundus Exam     External Exam       Right Left   External Normal Normal         Slit Lamp Exam       Right Left    Lids/Lashes Normal Normal   Conjunctiva/Sclera White and quiet White and quiet   Cornea Clear Clear   Anterior Chamber Deep and quiet Deep and quiet   Iris Round and reactive Round and reactive   Lens Posterior chamber intraocular lens Posterior chamber intraocular lens   Anterior Vitreous Normal Normal         Fundus Exam       Right Left   Posterior Vitreous Posterior vitreous detachment    Disc Normal    C/D Ratio 0.3    Macula Epiretinal membrane, good focal laser, Macular thickening    Vessels PDR-quiet    Periphery good PRP,, ATTACHED             IMAGING AND PROCEDURES  Imaging and Procedures for 02/11/21  OCT, Retina - OU - Both Eyes       Right Eye Quality was good. Scan locations included subfoveal. Central Foveal Thickness: 313. Progression has been stable. Findings include epiretinal membrane, cystoid macular edema, abnormal foveal contour.   Left Eye Quality was good. Scan locations included subfoveal. Central Foveal Thickness: 349. Findings include abnormal foveal contour, cystoid macular edema.   Notes Today at 7-week follow-up right eye, repeat injection today and examination next in 7 to 8 weeks  OS, currently at 13 weeks with slight increase in CME (patient missed recent appointment 2 weeks ago)       Intravitreal Injection, Pharmacologic Agent - OD - Right Eye       Time Out 02/11/2021. 4:19 PM. Confirmed correct patient, procedure, site, and patient consented.   Anesthesia Topical anesthesia was used. Anesthetic medications included Akten 3.5%.   Procedure Preparation included 10% betadine to eyelids, 5% betadine to ocular surface, Ofloxacin . A 30 gauge needle was used.   Injection: 2 mg aflibercept 2 MG/0.05ML   Route: Intravitreal, Site: Right Eye   NDC: [id number], Lot: [id number], Waste: 0 mL   Post-op Post injection  exam found visual acuity of at least counting fingers. The patient tolerated the procedure well. There were no  complications. The patient received written and verbal post procedure care education. Post injection medications were not given.              ASSESSMENT/PLAN:  Proliferative diabetic retinopathy of left eye with macular edema associated with type 2 diabetes mellitus ([place]) By OCT OS today, minor recurrence of CSME had extended interval and inadvertently at 13 weeks.  We will need to repeat in exam and also intravitreal Eylea in the coming weeks  Diabetic macular edema of right eye with proliferative retinopathy associated with type 2 diabetes mellitus ([place]) At 7-week interval today, overall largely stable CSME OD and stable acuity.  Right epiretinal membrane Nasal to the fovea no impact on acuity OD     ICD-10-CM   1. Diabetic macular edema of right eye with proliferative retinopathy associated with type 2 diabetes mellitus (HCC)  E11.3511 OCT, Retina - OU - Both Eyes    Intravitreal Injection, Pharmacologic Agent - OD - Right Eye    aflibercept (EYLEA) SOLN 2 mg    2. Proliferative diabetic retinopathy of left eye with macular edema associated with type 2 diabetes mellitus ([place])  L46.5035     3. Right epiretinal membrane  H35.371       1.  At 7-week interval, follow-up examination confirms persistent and yet improved CSME OD.  Repeat injection today of Eylea and follow-up next in 7 weeks  2.  OS, at 13 weeks, some 2 weeks interval longer than intended due to missed appointment.  Nonetheless only minor CSME has recurred.  We will schedule follow-up in 1 to 2 weeks for repeat exam and Eylea OS  3.  Ophthalmic Meds Ordered this visit:  Meds ordered this encounter  Medications   aflibercept (EYLEA) SOLN 2 mg       Return in about 8 weeks (around 04/08/2021) for dilate, OD, EYLEA OCT in 7 weeks OD,, follow-up 1 to 2 weeks dilate OS Eylea OCT.  There are no Patient Instructions on file for this visit.   Explained the diagnoses, plan, and follow up with the patient and they  expressed understanding.  Patient expressed understanding of the importance of proper follow up care.   Clent Demark Emersynn Deatley M.D. Diseases & Surgery of the Retina and Vitreous Retina [facility] Diabetic [facility] 02/11/21     Abbreviations: M myopia (nearsighted); A astigmatism; H hyperopia (farsighted); P presbyopia; Mrx spectacle prescription;  CTL contact lenses; OD right eye; OS left eye; OU both eyes  XT exotropia; ET esotropia; PEK punctate epithelial keratitis; PEE punctate epithelial erosions; DES dry eye syndrome; MGD meibomian gland dysfunction; ATs artificial tears; PFAT's preservative free artificial tears; [place] nuclear sclerotic cataract; PSC posterior subcapsular cataract; ERM epi-retinal membrane; PVD posterior vitreous detachment; RD retinal detachment; DM diabetes mellitus; DR diabetic retinopathy; NPDR non-proliferative diabetic retinopathy; PDR proliferative diabetic retinopathy; CSME clinically significant macular edema; DME diabetic macular edema; dbh dot blot hemorrhages; CWS cotton wool spot; POAG primary open angle glaucoma; C/D cup-to-disc ratio; HVF humphrey visual field; GVF goldmann visual field; OCT optical coherence tomography; IOP intraocular pressure; BRVO Branch retinal vein occlusion; CRVO central retinal vein occlusion; CRAO central retinal artery occlusion; BRAO branch retinal artery occlusion; RT retinal tear; SB scleral buckle; PPV pars plana vitrectomy; VH Vitreous hemorrhage; PRP panretinal laser photocoagulation; IVK intravitreal kenalog; VMT vitreomacular traction; MH Macular hole;  NVD neovascularization of the disc; NVE neovascularization elsewhere; AREDS  age related eye disease study; ARMD age related macular degeneration; POAG primary open angle glaucoma; EBMD epithelial/anterior basement membrane dystrophy; ACIOL anterior chamber intraocular lens; IOL intraocular lens; PCIOL posterior chamber intraocular lens; Phaco/IOL phacoemulsification with intraocular lens placement;  [place] photorefractive keratectomy; LASIK laser assisted in situ keratomileusis; HTN hypertension; DM diabetes mellitus; COPD chronic obstructive pulmonary disease

## 2021-02-11 NOTE — Assessment & Plan Note (Signed)
Nasal to the fovea no impact on acuity OD

## 2021-02-11 NOTE — Assessment & Plan Note (Signed)
At 7-week interval today, overall largely stable CSME OD and stable acuity.

## 2021-02-11 NOTE — Assessment & Plan Note (Signed)
By OCT OS today, minor recurrence of CSME had extended interval and inadvertently at 13 weeks.  We will need to repeat in exam and also intravitreal Eylea in the coming weeks

## 2021-02-25 ENCOUNTER — Ambulatory Visit (INDEPENDENT_AMBULATORY_CARE_PROVIDER_SITE_OTHER): Payer: Medicare Other | Admitting: Ophthalmology

## 2021-02-25 ENCOUNTER — Other Ambulatory Visit: Payer: Self-pay

## 2021-02-25 ENCOUNTER — Encounter (INDEPENDENT_AMBULATORY_CARE_PROVIDER_SITE_OTHER): Payer: Self-pay | Admitting: Ophthalmology

## 2021-02-25 DIAGNOSIS — E113511 Type 2 diabetes mellitus with proliferative diabetic retinopathy with macular edema, right eye: Secondary | ICD-10-CM | POA: Diagnosis not present

## 2021-02-25 DIAGNOSIS — Z9989 Dependence on other enabling machines and devices: Secondary | ICD-10-CM | POA: Diagnosis not present

## 2021-02-25 DIAGNOSIS — E113512 Type 2 diabetes mellitus with proliferative diabetic retinopathy with macular edema, left eye: Secondary | ICD-10-CM

## 2021-02-25 DIAGNOSIS — G4733 Obstructive sleep apnea (adult) (pediatric): Secondary | ICD-10-CM

## 2021-02-25 MED ORDER — AFLIBERCEPT 2MG/0.05ML IZ SOLN FOR KALEIDOSCOPE
2.0000 mg | INTRAVITREAL | Status: AC | PRN
  Administered 2021-02-25: 2 mg via INTRAVITREAL

## 2021-02-25 NOTE — Assessment & Plan Note (Signed)
Today at 14 weeks postinjection Eylea, with slightly increased CSME.  We will repeat injection Eylea today and decrease interval therapy left eye to 9 weeks for evaluation of efficacy

## 2021-02-25 NOTE — Assessment & Plan Note (Signed)
Compliant with Cpap as of this date

## 2021-02-25 NOTE — Progress Notes (Signed)
02/25/2021     CHIEF COMPLAINT Patient presents for Retina Follow Up (2 week fu OS and Eylea OS/Pt states VA OU stable since last visit. Pt denies FOL, floaters, or ocular pain OU. [place]: 7.5/LBS: 135)   HISTORY OF PRESENT ILLNESS: Grace [name] is a 70 y.o. female who presents to the clinic today for:   HPI     Retina Follow Up           Diagnosis: Diabetic Retinopathy   Laterality: left eye   Onset: 2 weeks ago   Severity: mild   Duration: 2 weeks   Course: stable   Comments: 2 week fu OS and Eylea OS Pt states VA OU stable since last visit. Pt denies FOL, floaters, or ocular pain OU.  A1C: 7.5 LBS: 135       Last edited by Kendra Opitz, COA on 02/25/2021  3:28 PM.      Referring physician: Unk Pinto, MD [address] [place],  [place] [address]  HISTORICAL INFORMATION:   Selected notes from the MEDICAL RECORD NUMBER    Lab Results  Component Value Date   HGBA1C 7.5 (H) 12/05/2020     CURRENT MEDICATIONS: No current outpatient medications on file. (Ophthalmic Drugs)   No current facility-administered medications for this visit. (Ophthalmic Drugs)   Current Outpatient Medications (Other)  Medication Sig   Ascorbic Acid (VITAMIN C PO) Take 1 tablet by mouth daily.   Calcium Citrate-Vitamin D (CITRACAL/VITAMIN D PO) Take 1 tablet by mouth daily.   Cholecalciferol (VITAMIN D PO) Take 5,000 Units by mouth daily.   DULoxetine (CYMBALTA) 30 MG capsule Take 1 capsule (30 mg total) by mouth daily.   glimepiride (AMARYL) 2 MG tablet Take  1 tablet  2 x /day  with Meals  for Diabetes   glucose blood (ACCU-CHEK GUIDE) test strip Check blood sugar 3 times a day-DX-E11.69 AND E78.5.   Magnesium 250 MG TABS Take 2 tablets by mouth daily.    metFORMIN (GLUCOPHAGE-XR) 500 MG 24 hr tablet TAKE 2 TABLETS 2 X /DAY WITH MEALS FOR DIABETES   Omega-3 Fatty Acids (OMEGA-3 FISH OIL PO) Take 1 capsule by mouth daily.   zinc gluconate 50 MG tablet  Take 50 mg by mouth daily.   No current facility-administered medications for this visit. (Other)      REVIEW OF SYSTEMS:    ALLERGIES Allergies  Allergen Reactions   Gabapentin Swelling    Swelling in legs and feet    PAST MEDICAL HISTORY Past Medical History:  Diagnosis Date   Abnormality of gait 07/28/2013   Diabetes ([place])    Diabetic peripheral neuropathy (HCC)    Diabetic retinopathy ([place])    Foot drop, bilateral 07/31/2014   Peripheral edema    Polyneuropathy in diabetes(357.2) 07/28/2013   Past Surgical History:  Procedure Laterality Date   ABDOMINAL HYSTERECTOMY     ANKLE FRACTURE SURGERY     left   CATARACT EXTRACTION W/PHACO Right 02/21/2014   Dr. Herbert Deaner   CATARACT EXTRACTION W/PHACO Left 04/10/2014   Dr. Herbert Deaner   CATARACT EXTRACTION, [place]     right   TONSILLECTOMY      FAMILY HISTORY Family History  Problem Relation Age of Onset   Diabetes Mother    Diabetes Brother    Neuropathy Neg Hx     SOCIAL HISTORY Social History   Tobacco Use   Smoking status: Never   Smokeless tobacco: Never  Substance Use Topics   Alcohol use: No   Drug use: No         OPHTHALMIC EXAM:  Base Eye Exam     Visual Acuity (ETDRS)       Right Left   Dist [place] 20/50 +2 20/80 -1   Dist ph [place] 20/70 -2         Tonometry (Tonopen, 3:31 PM)       Right Left   Pressure 22 21         Pupils       Pupils Dark Light Shape React APD   Right PERRL 3 3 Round Minimal None   Left PERRL 3 3 Round Minimal None         Visual Fields (Counting fingers)       Left Right    Full Full         Extraocular Movement       Right Left    Full Full         Neuro/Psych     Oriented x3: Yes   Mood/Affect: Normal         Dilation     Left eye: 1.0% Mydriacyl, 2.5% Phenylephrine @ 3:31 PM           Slit Lamp and Fundus Exam     External Exam       Right Left   External Normal Normal         Slit Lamp Exam        Right Left   Lids/Lashes Normal Normal   Conjunctiva/Sclera White and quiet White and quiet   Cornea Clear Clear   Anterior Chamber Deep and quiet Deep and quiet   Iris Round and reactive Round and reactive   Lens Posterior chamber intraocular lens Posterior chamber intraocular lens   Anterior Vitreous Normal Normal         Fundus Exam       Right Left   Posterior Vitreous  Posterior vitreous detachment   Disc  Normal   C/D Ratio  0.25   Macula  Microaneurysms, Macular thickening, Clinically significant macular edema, Mild clinically significant macular edema   Vessels  Severe nonproliferative diabetic retinopathy with retinal nonperfusion   Periphery  Good peripheral PRP            IMAGING AND PROCEDURES  Imaging and Procedures for 02/25/21  OCT, Retina - OU - Both Eyes       Right Eye Quality was good. Scan locations included subfoveal. Central Foveal Thickness: 278. Progression has been stable. Findings include epiretinal membrane, cystoid macular edema, abnormal foveal contour.   Left Eye Quality was good. Scan locations included subfoveal. Central Foveal Thickness: 367. Findings include abnormal foveal contour, cystoid macular edema.   Notes Today at 7-week follow-up right eye, r  OS, currently at 13 weeks with slight increase in CME (patient missed recent appointment 2 weeks ago)       Intravitreal Injection, Pharmacologic Agent - OS - Left Eye       Time Out 02/25/2021. 4:16 PM. Confirmed correct patient, procedure, site, and patient consented.   Anesthesia Topical anesthesia was used. Anesthetic medications included Akten 3.5%.   Procedure Preparation included Tobramycin 0.3%, 5% betadine to ocular surface, 10% betadine to eyelids, Ofloxacin . A 30 gauge needle was used.   Injection: 2 mg aflibercept 2 MG/0.05ML   Route: Intravitreal, Site: Left Eye   NDC: [id number], Lot: [id number], Waste: 0 mL  Post-op Post injection exam found  visual acuity of at least counting fingers. The patient tolerated the procedure well. There were no complications. The patient received written and verbal post procedure care education. Post injection medications were not given.              ASSESSMENT/PLAN:  OSA on CPAP Compliant with Cpap as of this date  Diabetic macular edema of right eye with proliferative retinopathy associated with type 2 diabetes mellitus (HCC) Improved OCT and clinically acuity on the right eyes some 2 weeks post most recent injection Eylea  Proliferative diabetic retinopathy of left eye with macular edema associated with type 2 diabetes mellitus ([place]) Today at 14 weeks postinjection Eylea, with slightly increased CSME.  We will repeat injection Eylea today and decrease interval therapy left eye to 9 weeks for evaluation of efficacy     ICD-10-CM   1. Proliferative diabetic retinopathy of left eye with macular edema associated with type 2 diabetes mellitus (HCC)  X93.7169 OCT, Retina - OU - Both Eyes    Intravitreal Injection, Pharmacologic Agent - OS - Left Eye    aflibercept (EYLEA) SOLN 2 mg    2. OSA on CPAP  G47.33    Z99.89     3. Diabetic macular edema of right eye with proliferative retinopathy associated with type 2 diabetes mellitus ([place])  E11.3511       1.  Patient continues on intravitreal Eylea to maintain and prevent recurrence of diabetic macular edema.  Interval of therapy has been lengthen at times to 3 months.  In today with slightly increased CSME.  Will repeat injection Eylea today and follow-up again in 9 weeks left eye to confirm most stability possible for CSME  2.  OD follow-up as scheduled.  Still looks great some 2 weeks post most recent injection OD  3.  Ophthalmic Meds Ordered this visit:  Meds ordered this encounter  Medications   aflibercept (EYLEA) SOLN 2 mg       Return in about 9 weeks (around 04/29/2021) for dilate, OS, EYLEA OCT.  There are no Patient  Instructions on file for this visit.   Explained the diagnoses, plan, and follow up with the patient and they expressed understanding.  Patient expressed understanding of the importance of proper follow up care.   Clent Demark Farren Nelles M.D. Diseases & Surgery of the Retina and Vitreous Retina & Diabetic [facility] 02/25/21     Abbreviations: M myopia (nearsighted); A astigmatism; H hyperopia (farsighted); P presbyopia; Mrx spectacle prescription;  CTL contact lenses; OD right eye; OS left eye; OU both eyes  XT exotropia; ET esotropia; PEK punctate epithelial keratitis; PEE punctate epithelial erosions; DES dry eye syndrome; MGD meibomian gland dysfunction; ATs artificial tears; PFAT's preservative free artificial tears; [place] nuclear sclerotic cataract; PSC posterior subcapsular cataract; ERM epi-retinal membrane; PVD posterior vitreous detachment; RD retinal detachment; DM diabetes mellitus; DR diabetic retinopathy; NPDR non-proliferative diabetic retinopathy; PDR proliferative diabetic retinopathy; CSME clinically significant macular edema; DME diabetic macular edema; dbh dot blot hemorrhages; CWS cotton wool spot; POAG primary open angle glaucoma; C/D cup-to-disc ratio; HVF humphrey visual field; GVF goldmann visual field; OCT optical coherence tomography; IOP intraocular pressure; BRVO Branch retinal vein occlusion; CRVO central retinal vein occlusion; CRAO central retinal artery occlusion; BRAO branch retinal artery occlusion; RT retinal tear; SB scleral buckle; PPV pars plana vitrectomy; VH Vitreous hemorrhage; PRP panretinal laser photocoagulation; IVK intravitreal kenalog; VMT vitreomacular traction; MH Macular hole;  NVD neovascularization of the disc; NVE  neovascularization elsewhere; AREDS age related eye disease study; ARMD age related macular degeneration; POAG primary open angle glaucoma; EBMD epithelial/anterior basement membrane dystrophy; ACIOL anterior chamber intraocular lens; IOL intraocular  lens; PCIOL posterior chamber intraocular lens; Phaco/IOL phacoemulsification with intraocular lens placement; [place] photorefractive keratectomy; LASIK laser assisted in situ keratomileusis; HTN hypertension; DM diabetes mellitus; COPD chronic obstructive pulmonary disease

## 2021-02-25 NOTE — Assessment & Plan Note (Signed)
Improved OCT and clinically acuity on the right eyes some 2 weeks post most recent injection Eylea

## 2021-02-27 DIAGNOSIS — U071 COVID-19: Secondary | ICD-10-CM | POA: Diagnosis not present

## 2021-03-14 ENCOUNTER — Other Ambulatory Visit: Payer: Self-pay | Admitting: Adult Health

## 2021-04-08 ENCOUNTER — Ambulatory Visit (INDEPENDENT_AMBULATORY_CARE_PROVIDER_SITE_OTHER): Payer: Medicare Other | Admitting: Ophthalmology

## 2021-04-08 ENCOUNTER — Other Ambulatory Visit: Payer: Self-pay

## 2021-04-08 ENCOUNTER — Encounter (INDEPENDENT_AMBULATORY_CARE_PROVIDER_SITE_OTHER): Payer: Self-pay | Admitting: Ophthalmology

## 2021-04-08 DIAGNOSIS — E113511 Type 2 diabetes mellitus with proliferative diabetic retinopathy with macular edema, right eye: Secondary | ICD-10-CM | POA: Diagnosis not present

## 2021-04-08 DIAGNOSIS — E113512 Type 2 diabetes mellitus with proliferative diabetic retinopathy with macular edema, left eye: Secondary | ICD-10-CM

## 2021-04-08 MED ORDER — AFLIBERCEPT 2MG/0.05ML IZ SOLN FOR KALEIDOSCOPE
2.0000 mg | INTRAVITREAL | Status: AC | PRN
  Administered 2021-04-08: 2 mg via INTRAVITREAL

## 2021-04-08 NOTE — Assessment & Plan Note (Signed)
Chronic and recurrent thickening centrally at 8-week interval will need repeat injection today and shorten interval to 5 to 6 weeks

## 2021-04-08 NOTE — Progress Notes (Signed)
04/08/2021     CHIEF COMPLAINT Patient presents for  Chief Complaint  Patient presents with   Retina Follow Up    2 week fu OS and Eylea OS Pt states VA OU stable since last visit. Pt denies FOL, floaters, or ocular pain OU.  A1C: 7.5 LBS: 135      HISTORY OF PRESENT ILLNESS: Grace [name] is a 70 y.o. female who presents to the clinic today for:   HPI     Retina Follow Up           Diagnosis: Diabetic Retinopathy   Laterality: right eye   Onset: 8 weeks ago   Severity: mild   Duration: 8 weeks   Course: stable   Comments: 2 week fu OS and Eylea OS Pt states VA OU stable since last visit. Pt denies FOL, floaters, or ocular pain OU.  A1C: 7.5 LBS: 135         Comments   8 week fu od oct eylea od with history of recurrent CSME OU Patient states in the past week she has noticed a decrease in vision. States "I am seeing more spider webs. The contrast is off, I can see it if it is black and white but there has to be a large amount of contrast in the colors for me to be able to see it. At church I cannot read the screen as well as I could." BS: 102 A1C: 7.5      Last edited by Hurman Horn, MD on 04/08/2021  4:19 PM.      Referring physician: Unk Pinto, MD [address] [place],  [place] [id number]  HISTORICAL INFORMATION:   Selected notes from the MEDICAL RECORD NUMBER    Lab Results  Component Value Date   HGBA1C 7.5 (H) 12/05/2020     CURRENT MEDICATIONS: No current outpatient medications on file. (Ophthalmic Drugs)   No current facility-administered medications for this visit. (Ophthalmic Drugs)   Current Outpatient Medications (Other)  Medication Sig   Ascorbic Acid (VITAMIN C PO) Take 1 tablet by mouth daily.   Calcium Citrate-Vitamin D (CITRACAL/VITAMIN D PO) Take 1 tablet by mouth daily.   Cholecalciferol (VITAMIN D PO) Take 5,000 Units by mouth daily.   DULoxetine (CYMBALTA) 30 MG capsule Take 1 capsule (30 mg  total) by mouth daily.   glimepiride (AMARYL) 2 MG tablet Take  1 tablet  2 x /day  with Meals  for Diabetes   glucose blood (ACCU-CHEK GUIDE) test strip Check blood sugar 3 times a day-DX-E11.69 AND E78.5.   Magnesium 250 MG TABS Take 2 tablets by mouth daily.    metFORMIN (GLUCOPHAGE-XR) 500 MG 24 hr tablet TAKE 2 TABLETS TWICE DAILY FOR DIABETES   Omega-3 Fatty Acids (OMEGA-3 FISH OIL PO) Take 1 capsule by mouth daily.   zinc gluconate 50 MG tablet Take 50 mg by mouth daily.   No current facility-administered medications for this visit. (Other)      REVIEW OF SYSTEMS:    ALLERGIES Allergies  Allergen Reactions   Gabapentin Swelling    Swelling in legs and feet    PAST MEDICAL HISTORY Past Medical History:  Diagnosis Date   Abnormality of gait 07/28/2013   Diabetes ([place])    Diabetic peripheral neuropathy ([place])    Diabetic retinopathy ([place])    Foot drop, bilateral 07/31/2014   Peripheral edema    Polyneuropathy in diabetes(357.2) 07/28/2013   Past Surgical History:  Procedure Laterality Date  ABDOMINAL HYSTERECTOMY     ANKLE FRACTURE SURGERY     left   CATARACT EXTRACTION W/PHACO Right 02/21/2014   Dr. Herbert Deaner   CATARACT EXTRACTION W/PHACO Left 04/10/2014   Dr. Herbert Deaner   CATARACT EXTRACTION, [place]     right   TONSILLECTOMY      FAMILY HISTORY Family History  Problem Relation Age of Onset   Diabetes Mother    Diabetes Brother    Neuropathy Neg Hx     SOCIAL HISTORY Social History   Tobacco Use   Smoking status: Never   Smokeless tobacco: Never  Substance Use Topics   Alcohol use: No   Drug use: No         OPHTHALMIC EXAM:  Base Eye Exam     Visual Acuity (ETDRS)       Right Left   Dist [place] 20/60 +2 20/70 -1   Dist ph [place] 20/50 -1 20/60 -1         Tonometry (Tonopen, 3:56 PM)       Right Left   Pressure 18 14         Pupils       Pupils Dark Light Shape React APD   Right PERRL 4 3 Round Minimal None    Left PERRL 4 3 Round Minimal None         Visual Fields (Counting fingers)       Left Right     Full   Restrictions Partial outer superior temporal deficiency          Extraocular Movement       Right Left    Full Full         Neuro/Psych     Oriented x3: Yes   Mood/Affect: Normal         Dilation     Right eye: 1.0% Mydriacyl, 2.5% Phenylephrine @ 3:56 PM           Slit Lamp and Fundus Exam     External Exam       Right Left   External Normal Normal         Slit Lamp Exam       Right Left   Lids/Lashes Normal Normal   Conjunctiva/Sclera White and quiet White and quiet   Cornea Clear Clear   Anterior Chamber Deep and quiet Deep and quiet   Iris Round and reactive Round and reactive   Lens Posterior chamber intraocular lens Posterior chamber intraocular lens   Anterior Vitreous Normal Normal         Fundus Exam       Right Left   Posterior Vitreous Posterior vitreous detachment    Disc Normal    C/D Ratio 0.3    Macula Epiretinal membrane, good focal laser, Macular thickening    Vessels PDR-quiet    Periphery good PRP,, ATTACHED             IMAGING AND PROCEDURES  Imaging and Procedures for 04/08/21  OCT, Retina - OU - Both Eyes       Right Eye Quality was good. Scan locations included subfoveal. Central Foveal Thickness: 332. Progression has been stable. Findings include epiretinal membrane, cystoid macular edema, abnormal foveal contour.   Left Eye Quality was good. Scan locations included subfoveal. Central Foveal Thickness: 382. Findings include abnormal foveal contour, cystoid macular edema.   Notes Today at 8-week follow-up right eye,   OS, currently at weeks with slight  increase in CME at 6-week interval        Intravitreal Injection, Pharmacologic Agent - OD - Right Eye       Time Out 04/08/2021. 4:19 PM. Confirmed correct patient, procedure, site, and patient consented.   Anesthesia Topical anesthesia  was used. Anesthetic medications included Akten 3.5%.   Procedure Preparation included 10% betadine to eyelids, 5% betadine to ocular surface, Ofloxacin . A 30 gauge needle was used.   Injection: 2 mg aflibercept 2 MG/0.05ML   Route: Intravitreal, Site: Right Eye   NDC: [id number], Lot: [id number], Waste: 0 mL   Post-op Post injection exam found visual acuity of at least counting fingers. The patient tolerated the procedure well. There were no complications. The patient received written and verbal post procedure care education. Post injection medications were not given.              ASSESSMENT/PLAN:  Proliferative diabetic retinopathy of left eye with macular edema associated with type 2 diabetes mellitus (HCC) Chronic and recurrent at 6-week postinjection OS will need repeat injection soon  Diabetic macular edema of right eye with proliferative retinopathy associated with type 2 diabetes mellitus (HCC) Chronic and recurrent thickening centrally at 8-week interval will need repeat injection today and shorten interval to 5 to 6 weeks     ICD-10-CM   1. Diabetic macular edema of right eye with proliferative retinopathy associated with type 2 diabetes mellitus (HCC)  E11.3511 OCT, Retina - OU - Both Eyes    Intravitreal Injection, Pharmacologic Agent - OD - Right Eye    aflibercept (EYLEA) SOLN 2 mg    2. Proliferative diabetic retinopathy of left eye with macular edema associated with type 2 diabetes mellitus ([place])  [id number]       1.  OD with noticeable decline in acuity per patient, CSME center involved increased at current 8-week interval.  We will repeat injection today and follow-up next in 5 weeks for repeat Eylea injection and likely settle in at 6-week interval for the duration  2.  OS currently at 6-week postinjection with recurrence of center involved CSME.  Currently scheduled to return in 3 weeks.  We will attempt to follow-up in 1 to 2 weeks OS  3.  Ophthalmic  Meds Ordered this visit:  Meds ordered this encounter  Medications   aflibercept (EYLEA) SOLN 2 mg        Return in about 5 weeks (around 05/13/2021) for dilate, OD, EYLEA OCT,,, and change OS to 1 to 2 weeks Eylea OCT.  There are no Patient Instructions on file for this visit.   Explained the diagnoses, plan, and follow up with the patient and they expressed understanding.  Patient expressed understanding of the importance of proper follow up care.   Clent Demark Eidan Muellner M.D. Diseases & Surgery of the Retina and Vitreous Retina & Diabetic [place] 04/08/21     Abbreviations: M myopia (nearsighted); A astigmatism; H hyperopia (farsighted); P presbyopia; Mrx spectacle prescription;  CTL contact lenses; OD right eye; OS left eye; OU both eyes  XT exotropia; ET esotropia; PEK punctate epithelial keratitis; PEE punctate epithelial erosions; DES dry eye syndrome; MGD meibomian gland dysfunction; ATs artificial tears; PFAT's preservative free artificial tears; [place] nuclear sclerotic cataract; PSC posterior subcapsular cataract; ERM epi-retinal membrane; PVD posterior vitreous detachment; RD retinal detachment; DM diabetes mellitus; DR diabetic retinopathy; NPDR non-proliferative diabetic retinopathy; PDR proliferative diabetic retinopathy; CSME clinically significant macular edema; DME diabetic macular edema; dbh dot blot hemorrhages; CWS cotton wool spot;  POAG primary open angle glaucoma; C/D cup-to-disc ratio; HVF humphrey visual field; GVF goldmann visual field; OCT optical coherence tomography; IOP intraocular pressure; BRVO Branch retinal vein occlusion; CRVO central retinal vein occlusion; CRAO central retinal artery occlusion; BRAO branch retinal artery occlusion; RT retinal tear; SB scleral buckle; PPV pars plana vitrectomy; VH Vitreous hemorrhage; PRP panretinal laser photocoagulation; IVK intravitreal kenalog; VMT vitreomacular traction; MH Macular hole;  NVD neovascularization of the disc;  NVE neovascularization elsewhere; AREDS age related eye disease study; ARMD age related macular degeneration; POAG primary open angle glaucoma; EBMD epithelial/anterior basement membrane dystrophy; ACIOL anterior chamber intraocular lens; IOL intraocular lens; PCIOL posterior chamber intraocular lens; Phaco/IOL phacoemulsification with intraocular lens placement; [place] photorefractive keratectomy; LASIK laser assisted in situ keratomileusis; HTN hypertension; DM diabetes mellitus; COPD chronic obstructive pulmonary disease

## 2021-04-08 NOTE — Assessment & Plan Note (Signed)
Chronic and recurrent at 6-week postinjection OS will need repeat injection soon

## 2021-04-15 ENCOUNTER — Encounter (INDEPENDENT_AMBULATORY_CARE_PROVIDER_SITE_OTHER): Payer: Medicare Other | Admitting: Ophthalmology

## 2021-04-16 ENCOUNTER — Encounter (INDEPENDENT_AMBULATORY_CARE_PROVIDER_SITE_OTHER): Payer: Self-pay | Admitting: Ophthalmology

## 2021-04-16 ENCOUNTER — Ambulatory Visit (INDEPENDENT_AMBULATORY_CARE_PROVIDER_SITE_OTHER): Payer: Medicare Other | Admitting: Ophthalmology

## 2021-04-16 ENCOUNTER — Encounter (INDEPENDENT_AMBULATORY_CARE_PROVIDER_SITE_OTHER): Payer: Medicare Other | Admitting: Ophthalmology

## 2021-04-16 ENCOUNTER — Other Ambulatory Visit: Payer: Self-pay

## 2021-04-16 DIAGNOSIS — E113511 Type 2 diabetes mellitus with proliferative diabetic retinopathy with macular edema, right eye: Secondary | ICD-10-CM | POA: Diagnosis not present

## 2021-04-16 DIAGNOSIS — E113512 Type 2 diabetes mellitus with proliferative diabetic retinopathy with macular edema, left eye: Secondary | ICD-10-CM | POA: Diagnosis not present

## 2021-04-16 DIAGNOSIS — H35371 Puckering of macula, right eye: Secondary | ICD-10-CM | POA: Diagnosis not present

## 2021-04-16 MED ORDER — AFLIBERCEPT 2MG/0.05ML IZ SOLN FOR KALEIDOSCOPE
2.0000 mg | INTRAVITREAL | Status: AC | PRN
  Administered 2021-04-16: 2 mg via INTRAVITREAL

## 2021-04-16 NOTE — Progress Notes (Signed)
04/16/2021     CHIEF COMPLAINT Patient presents for  Chief Complaint  Patient presents with   Retina Follow Up      HISTORY OF PRESENT ILLNESS: Grace [name] is a 70 y.o. female who presents to the clinic today for:   HPI     Retina Follow Up   Patient presents with  Diabetic Retinopathy.  In left eye.  This started 7 weeks ago.  Severity is mild.  Duration of 7 weeks.  Since onset it is gradually improving.        Comments   7 week fu OS and Eylea OS  Pt states, "My vision seems to be slightly better. I was just here for my other eye." A1C: 7.5 LBS: 102.       Last edited by Hurman Horn, MD on 04/16/2021  4:20 PM.      Referring physician: Unk Pinto, MD [address] [place],  [place] [address]  HISTORICAL INFORMATION:   Selected notes from the MEDICAL RECORD NUMBER    Lab Results  Component Value Date   HGBA1C 7.5 (H) 12/05/2020     CURRENT MEDICATIONS: No current outpatient medications on file. (Ophthalmic Drugs)   No current facility-administered medications for this visit. (Ophthalmic Drugs)   Current Outpatient Medications (Other)  Medication Sig   Ascorbic Acid (VITAMIN C PO) Take 1 tablet by mouth daily.   Calcium Citrate-Vitamin D (CITRACAL/VITAMIN D PO) Take 1 tablet by mouth daily.   Cholecalciferol (VITAMIN D PO) Take 5,000 Units by mouth daily.   DULoxetine (CYMBALTA) 30 MG capsule Take 1 capsule (30 mg total) by mouth daily.   glimepiride (AMARYL) 2 MG tablet Take  1 tablet  2 x /day  with Meals  for Diabetes   glucose blood (ACCU-CHEK GUIDE) test strip Check blood sugar 3 times a day-DX-E11.69 AND E78.5.   Magnesium 250 MG TABS Take 2 tablets by mouth daily.    metFORMIN (GLUCOPHAGE-XR) 500 MG 24 hr tablet TAKE 2 TABLETS TWICE DAILY FOR DIABETES   Omega-3 Fatty Acids (OMEGA-3 FISH OIL PO) Take 1 capsule by mouth daily.   zinc gluconate 50 MG tablet Take 50 mg by mouth daily.   No current  facility-administered medications for this visit. (Other)      REVIEW OF SYSTEMS:    ALLERGIES Allergies  Allergen Reactions   Gabapentin Swelling    Swelling in legs and feet    PAST MEDICAL HISTORY Past Medical History:  Diagnosis Date   Abnormality of gait 07/28/2013   Diabetes ([place])    Diabetic peripheral neuropathy (HCC)    Diabetic retinopathy ([place])    Foot drop, bilateral 07/31/2014   Peripheral edema    Polyneuropathy in diabetes(357.2) 07/28/2013   Past Surgical History:  Procedure Laterality Date   ABDOMINAL HYSTERECTOMY     ANKLE FRACTURE SURGERY     left   CATARACT EXTRACTION W/PHACO Right 02/21/2014   Dr. Herbert Deaner   CATARACT EXTRACTION W/PHACO Left 04/10/2014   Dr. Herbert Deaner   CATARACT EXTRACTION, [place]     right   TONSILLECTOMY      FAMILY HISTORY Family History  Problem Relation Age of Onset   Diabetes Mother    Diabetes Brother    Neuropathy Neg Hx     SOCIAL HISTORY Social History   Tobacco Use   Smoking status: Never   Smokeless tobacco: Never  Substance Use Topics   Alcohol use: No  Drug use: No         OPHTHALMIC EXAM:  Base Eye Exam     Visual Acuity (ETDRS)       Right Left   Dist [place] 20/60 -1 20/80   Dist ph [place] 20/40 20/70         Tonometry (Tonopen, 3:28 PM)       Right Left   Pressure 19 20         Pupils       Pupils Dark Light Shape React APD   Right PERRL 4 3 Round Minimal None   Left PERRL 4 3 Round Minimal None         Visual Fields       Left Right   Restrictions Partial outer superior temporal deficiency          Extraocular Movement       Right Left    Full Full         Neuro/Psych     Oriented x3: Yes   Mood/Affect: Normal         Dilation     Left eye: 1.0% Mydriacyl, 2.5% Phenylephrine @ 3:28 PM           Slit Lamp and Fundus Exam     External Exam       Right Left   External Normal Normal         Slit Lamp Exam       Right Left    Lids/Lashes Normal Normal   Conjunctiva/Sclera White and quiet White and quiet   Cornea Clear Clear   Anterior Chamber Deep and quiet Deep and quiet   Iris Round and reactive Round and reactive   Lens Posterior chamber intraocular lens Posterior chamber intraocular lens   Anterior Vitreous Normal Normal         Fundus Exam       Right Left   Posterior Vitreous  Posterior vitreous detachment   Disc  Normal   C/D Ratio  0.25   Macula  Microaneurysms, Macular thickening, Clinically significant macular edema, Mild clinically significant macular edema   Vessels  Severe nonproliferative diabetic retinopathy with retinal nonperfusion   Periphery  Good peripheral PRP            IMAGING AND PROCEDURES  Imaging and Procedures for 04/16/21  OCT, Retina - OU - Both Eyes       Right Eye Quality was good. Scan locations included subfoveal. Central Foveal Thickness: 293. Progression has improved.   Left Eye Quality was good. Scan locations included subfoveal. Central Foveal Thickness: 309. Progression has improved.   Notes OD 1 week postinjection for diabetic CSME.  Epiretinal membrane remains.  OS also spontaneous improvement with contralateral effect from injection Eylea OD, will repeat injection OS today and follow-up OS next in 7 weeks to leak from the next visit with the right eye by 3 weeks     Intravitreal Injection, Pharmacologic Agent - OS - Left Eye       Time Out 04/16/2021. 4:24 PM. Confirmed correct patient, procedure, site, and patient consented.   Anesthesia Topical anesthesia was used. Anesthetic medications included Akten 3.5%.   Procedure Preparation included Tobramycin 0.3%, 5% betadine to ocular surface, 10% betadine to eyelids, Ofloxacin . A 30 gauge needle was used.   Injection: 2 mg aflibercept 2 MG/0.05ML   Route: Intravitreal, Site: Left Eye   NDC: [id number], Lot: [id number], Waste: 0 mL   Post-op Post injection exam found  visual acuity  of at least counting fingers. The patient tolerated the procedure well. There were no complications. The patient received written and verbal post procedure care education. Post injection medications were not given.              ASSESSMENT/PLAN:  Right epiretinal membrane Stable little impact on acuity OD observe  Proliferative diabetic retinopathy of left eye with macular edema associated with type 2 diabetes mellitus ([place]) The left eye experienced a distant contralateral effect postinjection OD Eylea last week.  Less CSME today, planned injection OS today to maintain and prevent recurrence.  Follow-up next OS in 7 weeks  Diabetic macular edema of right eye with proliferative retinopathy associated with type 2 diabetes mellitus ([place]) OD improved at 1 week postinjection      ICD-10-CM   1. Proliferative diabetic retinopathy of left eye with macular edema associated with type 2 diabetes mellitus (HCC)  [id number] OCT, Retina - OU - Both Eyes    Intravitreal Injection, Pharmacologic Agent - OS - Left Eye    aflibercept (EYLEA) SOLN 2 mg    2. Right epiretinal membrane  H35.371     3. Diabetic macular edema of right eye with proliferative retinopathy associated with type 2 diabetes mellitus ([place])  E11.3511       1.  Bilateral diabetic CSME recurrences, improved OD 1 week postinjection and now OS with a contralateral distance effect from the injection OD last week  2.  OS for injection Eylea for long-term duration of benefit and less CSME and improved acuity symptomatically.  Follow-up OS next in 7 weeks  3.  Follow-up OD next in 1 month as scheduled  Ophthalmic Meds Ordered this visit:  Meds ordered this encounter  Medications   aflibercept (EYLEA) SOLN 2 mg       Return in about 7 weeks (around 06/04/2021) for dilate, OS, EYLEA OCT.  There are no Patient Instructions on file for this visit.   Explained the diagnoses, plan, and follow up with the patient and they  expressed understanding.  Patient expressed understanding of the importance of proper follow up care.   Clent Demark Jonah Nestle M.D. Diseases & Surgery of the Retina and Vitreous Retina & Diabetic [place] 04/16/21     Abbreviations: M myopia (nearsighted); A astigmatism; H hyperopia (farsighted); P presbyopia; Mrx spectacle prescription;  CTL contact lenses; OD right eye; OS left eye; OU both eyes  XT exotropia; ET esotropia; PEK punctate epithelial keratitis; PEE punctate epithelial erosions; DES dry eye syndrome; MGD meibomian gland dysfunction; ATs artificial tears; PFAT's preservative free artificial tears; [place] nuclear sclerotic cataract; PSC posterior subcapsular cataract; ERM epi-retinal membrane; PVD posterior vitreous detachment; RD retinal detachment; DM diabetes mellitus; DR diabetic retinopathy; NPDR non-proliferative diabetic retinopathy; PDR proliferative diabetic retinopathy; CSME clinically significant macular edema; DME diabetic macular edema; dbh dot blot hemorrhages; CWS cotton wool spot; POAG primary open angle glaucoma; C/D cup-to-disc ratio; HVF humphrey visual field; GVF goldmann visual field; OCT optical coherence tomography; IOP intraocular pressure; BRVO Branch retinal vein occlusion; CRVO central retinal vein occlusion; CRAO central retinal artery occlusion; BRAO branch retinal artery occlusion; RT retinal tear; SB scleral buckle; PPV pars plana vitrectomy; VH Vitreous hemorrhage; PRP panretinal laser photocoagulation; IVK intravitreal kenalog; VMT vitreomacular traction; MH Macular hole;  NVD neovascularization of the disc; NVE neovascularization elsewhere; AREDS age related eye disease study; ARMD age related macular degeneration; POAG primary open angle glaucoma; EBMD epithelial/anterior basement membrane dystrophy; ACIOL anterior chamber intraocular lens; IOL intraocular lens;  PCIOL posterior chamber intraocular lens; Phaco/IOL phacoemulsification with intraocular lens placement;  [place] photorefractive keratectomy; LASIK laser assisted in situ keratomileusis; HTN hypertension; DM diabetes mellitus; COPD chronic obstructive pulmonary disease

## 2021-04-16 NOTE — Assessment & Plan Note (Signed)
Stable little impact on acuity OD observe

## 2021-04-16 NOTE — Assessment & Plan Note (Signed)
The left eye experienced a distant contralateral effect postinjection OD Eylea last week.  Less CSME today, planned injection OS today to maintain and prevent recurrence.  Follow-up next OS in 7 weeks

## 2021-04-16 NOTE — Assessment & Plan Note (Signed)
OD improved at 1 week postinjection

## 2021-04-29 ENCOUNTER — Encounter (INDEPENDENT_AMBULATORY_CARE_PROVIDER_SITE_OTHER): Payer: Medicare Other | Admitting: Ophthalmology

## 2021-05-02 DIAGNOSIS — Z23 Encounter for immunization: Secondary | ICD-10-CM | POA: Diagnosis not present

## 2021-05-13 ENCOUNTER — Encounter (INDEPENDENT_AMBULATORY_CARE_PROVIDER_SITE_OTHER): Payer: Self-pay | Admitting: Ophthalmology

## 2021-05-13 ENCOUNTER — Other Ambulatory Visit: Payer: Self-pay

## 2021-05-13 ENCOUNTER — Ambulatory Visit (INDEPENDENT_AMBULATORY_CARE_PROVIDER_SITE_OTHER): Payer: Medicare Other | Admitting: Ophthalmology

## 2021-05-13 DIAGNOSIS — E113512 Type 2 diabetes mellitus with proliferative diabetic retinopathy with macular edema, left eye: Secondary | ICD-10-CM

## 2021-05-13 DIAGNOSIS — Z9989 Dependence on other enabling machines and devices: Secondary | ICD-10-CM | POA: Diagnosis not present

## 2021-05-13 DIAGNOSIS — E113511 Type 2 diabetes mellitus with proliferative diabetic retinopathy with macular edema, right eye: Secondary | ICD-10-CM

## 2021-05-13 DIAGNOSIS — G4733 Obstructive sleep apnea (adult) (pediatric): Secondary | ICD-10-CM

## 2021-05-13 DIAGNOSIS — H35371 Puckering of macula, right eye: Secondary | ICD-10-CM

## 2021-05-13 MED ORDER — AFLIBERCEPT 2MG/0.05ML IZ SOLN FOR KALEIDOSCOPE
2.0000 mg | INTRAVITREAL | Status: AC | PRN
  Administered 2021-05-13: 2 mg via INTRAVITREAL

## 2021-05-13 NOTE — Assessment & Plan Note (Signed)
OS today at 3 weeks and 5 days interval postinjection looks great.  Follow-up as scheduled

## 2021-05-13 NOTE — Assessment & Plan Note (Signed)
Vastly improved CSME OD at 5-week interval.  Repeat injection today and again in 5 weeks

## 2021-05-13 NOTE — Assessment & Plan Note (Signed)
Now up to 5 hours consistent use nightly as BiPAP via nasal pillars, which is a vast improvement over initial compliance capability

## 2021-05-13 NOTE — Assessment & Plan Note (Signed)
OD stable no impact on acuity

## 2021-05-13 NOTE — Progress Notes (Signed)
05/13/2021     CHIEF COMPLAINT Patient presents for  Chief Complaint  Patient presents with   Retina Follow Up      HISTORY OF PRESENT ILLNESS: Grace [name] is a 70 y.o. female who presents to the clinic today for:   HPI     Retina Follow Up   Patient presents with  Diabetic Retinopathy.  In right eye.  This started 5 weeks ago.  Severity is mild.  Duration of 5 weeks.  Since onset it is stable.        Comments   5 week fu od oct eylea od. Patient states vision is stable and unchanged since last visit. Denies any new floaters or FOL. LBS: 118 this morning before breakfast. A1C: 7.5       Last edited by Laurin Coder on 05/13/2021  4:16 PM.      Referring physician: Unk Pinto, MD [address] [place],  [place] [id number]  HISTORICAL INFORMATION:   Selected notes from the [facility]    Lab Results  Component Value Date   HGBA1C 7.5 (H) 12/05/2020     CURRENT MEDICATIONS: No current outpatient medications on file. (Ophthalmic Drugs)   No current facility-administered medications for this visit. (Ophthalmic Drugs)   Current Outpatient Medications (Other)  Medication Sig   Ascorbic Acid (VITAMIN C PO) Take 1 tablet by mouth daily.   Calcium Citrate-Vitamin D (CITRACAL/VITAMIN D PO) Take 1 tablet by mouth daily.   Cholecalciferol (VITAMIN D PO) Take 5,000 Units by mouth daily.   DULoxetine (CYMBALTA) 30 MG capsule Take 1 capsule (30 mg total) by mouth daily.   glimepiride (AMARYL) 2 MG tablet Take  1 tablet  2 x /day  with Meals  for Diabetes   glucose blood (ACCU-CHEK GUIDE) test strip Check blood sugar 3 times a day-DX-E11.69 AND E78.5.   Magnesium 250 MG TABS Take 2 tablets by mouth daily.    metFORMIN (GLUCOPHAGE-XR) 500 MG 24 hr tablet TAKE 2 TABLETS TWICE DAILY FOR DIABETES   Omega-3 Fatty Acids (OMEGA-3 FISH OIL PO) Take 1 capsule by mouth daily.   zinc gluconate 50 MG tablet Take 50 mg by mouth daily.    No current facility-administered medications for this visit. (Other)      REVIEW OF SYSTEMS:    ALLERGIES Allergies  Allergen Reactions   Gabapentin Swelling    Swelling in legs and feet    PAST MEDICAL HISTORY Past Medical History:  Diagnosis Date   Abnormality of gait 07/28/2013   Diabetes ([place])    Diabetic peripheral neuropathy (HCC)    Diabetic retinopathy ([place])    Foot drop, bilateral 07/31/2014   Peripheral edema    Polyneuropathy in diabetes(357.2) 07/28/2013   Past Surgical History:  Procedure Laterality Date   ABDOMINAL HYSTERECTOMY     ANKLE FRACTURE SURGERY     left   CATARACT EXTRACTION W/PHACO Right 02/21/2014   Dr. Herbert Deaner   CATARACT EXTRACTION W/PHACO Left 04/10/2014   Dr. Herbert Deaner   CATARACT EXTRACTION, [place]     right   TONSILLECTOMY      FAMILY HISTORY Family History  Problem Relation Age of Onset   Diabetes Mother    Diabetes Brother    Neuropathy Neg Hx     SOCIAL HISTORY Social History   Tobacco Use   Smoking status: Never   Smokeless tobacco: Never  Substance Use Topics   Alcohol use: No  Drug use: No         OPHTHALMIC EXAM:  Base Eye Exam     Visual Acuity (ETDRS)       Right Left   Dist [place] 20/40 -2 20/70 -1   Dist ph [place] NI 20/60 -1         Tonometry (Tonopen, 4:20 PM)       Right Left   Pressure 21 18         Pupils       Pupils Dark Light React APD   Right PERRL 4 3 Minimal None   Left PERRL 4 3 Minimal None         Extraocular Movement       Right Left    Full Full         Neuro/Psych     Oriented x3: Yes   Mood/Affect: Normal         Dilation     Right eye: 1.0% Mydriacyl, 2.5% Phenylephrine @ 4:20 PM           Slit Lamp and Fundus Exam     External Exam       Right Left   External Normal Normal         Slit Lamp Exam       Right Left   Lids/Lashes Normal Normal   Conjunctiva/Sclera White and quiet White and quiet   Cornea Clear Clear    Anterior Chamber Deep and quiet Deep and quiet   Iris Round and reactive Round and reactive   Lens Posterior chamber intraocular lens Posterior chamber intraocular lens   Anterior Vitreous Normal Normal         Fundus Exam       Right Left   Posterior Vitreous Posterior vitreous detachment    Disc Normal    C/D Ratio 0.3    Macula Epiretinal membrane, good focal laser, Macular thickening    Vessels PDR-quiet    Periphery good PRP,, ATTACHED             IMAGING AND PROCEDURES  Imaging and Procedures for 05/13/21  OCT, Retina - OU - Both Eyes       Right Eye Quality was good. Scan locations included subfoveal. Central Foveal Thickness: 296. Progression has improved.   Left Eye Quality was good. Scan locations included subfoveal. Central Foveal Thickness: 301. Progression has improved.   Notes OD 5 week postinjection for diabetic CSME.  Epiretinal membrane remains.  OS also spontaneous improvement, today at 3 weeks 5 days postinjection OS follow-up as scheduled     Intravitreal Injection, Pharmacologic Agent - OD - Right Eye       Time Out 05/13/2021. 5:08 PM. Confirmed correct patient, procedure, site, and patient consented.   Anesthesia Topical anesthesia was used. Anesthetic medications included [place] 3.5%.   Procedure Preparation included 10% betadine to eyelids, 5% betadine to ocular surface, Ofloxacin . A 30 gauge needle was used.   Injection: 2 mg aflibercept 2 MG/0.05ML   Route: Intravitreal, Site: Right Eye   NDC: [id number], Lot: [id number], Waste: 0 mL   Post-op Post injection exam found visual acuity of at least counting fingers. The patient tolerated the procedure well. There were no complications. The patient received written and verbal post procedure care education. Post injection medications included ocuflox.              ASSESSMENT/PLAN:  Right epiretinal membrane OD stable no impact on acuity  Diabetic macular edema of  right eye with proliferative retinopathy associated with type 2 diabetes mellitus ([place]) Vastly improved CSME OD at 5-week interval.  Repeat injection today and again in 5 weeks  Proliferative diabetic retinopathy of left eye with macular edema associated with type 2 diabetes mellitus (HCC) OS today at 3 weeks and 5 days interval postinjection looks great.  Follow-up as scheduled  OSA on CPAP Now up to 5 hours consistent use nightly as BiPAP via nasal pillars, which is a vast improvement over initial compliance capability      ICD-10-CM   1. Diabetic macular edema of right eye with proliferative retinopathy associated with type 2 diabetes mellitus (HCC)  E11.3511 OCT, Retina - OU - Both Eyes    Intravitreal Injection, Pharmacologic Agent - OD - Right Eye    aflibercept (EYLEA) SOLN 2 mg    2. Right epiretinal membrane  H35.371     3. Proliferative diabetic retinopathy of left eye with macular edema associated with type 2 diabetes mellitus ([place])  T46.5681     4. OSA on CPAP  G47.33    Z99.89       1.  Bilateral CSME vastly improved and stable on combination therapy including antivegF but also treating previously undocumented and untreated sleep apnea.  Successful BiPAP use allows the patient to avoid nightly macular hypoxic stress  2.  3.  Ophthalmic Meds Ordered this visit:  Meds ordered this encounter  Medications   aflibercept (EYLEA) SOLN 2 mg       Return in about 5 weeks (around 06/17/2021) for dilate, OD, EYLEA OCT,, and follow-up OS as scheduled.  There are no Patient Instructions on file for this visit.   Explained the diagnoses, plan, and follow up with the patient and they expressed understanding.  Patient expressed understanding of the importance of proper follow up care.   Clent Demark Ezekeil Bethel M.D. Diseases & Surgery of the Retina and Vitreous Retina & Diabetic [place] 05/13/21     Abbreviations: M myopia (nearsighted); A astigmatism; H hyperopia  (farsighted); P presbyopia; Mrx spectacle prescription;  CTL contact lenses; OD right eye; OS left eye; OU both eyes  XT exotropia; ET esotropia; PEK punctate epithelial keratitis; PEE punctate epithelial erosions; DES dry eye syndrome; MGD meibomian gland dysfunction; ATs artificial tears; PFAT's preservative free artificial tears; [place] nuclear sclerotic cataract; PSC posterior subcapsular cataract; ERM epi-retinal membrane; PVD posterior vitreous detachment; RD retinal detachment; DM diabetes mellitus; DR diabetic retinopathy; NPDR non-proliferative diabetic retinopathy; PDR proliferative diabetic retinopathy; CSME clinically significant macular edema; DME diabetic macular edema; dbh dot blot hemorrhages; CWS cotton wool spot; POAG primary open angle glaucoma; C/D cup-to-disc ratio; HVF humphrey visual field; GVF goldmann visual field; OCT optical coherence tomography; IOP intraocular pressure; BRVO Branch retinal vein occlusion; CRVO central retinal vein occlusion; CRAO central retinal artery occlusion; BRAO branch retinal artery occlusion; RT retinal tear; SB scleral buckle; PPV pars plana vitrectomy; VH Vitreous hemorrhage; PRP panretinal laser photocoagulation; IVK intravitreal kenalog; VMT vitreomacular traction; MH Macular hole;  NVD neovascularization of the disc; NVE neovascularization elsewhere; AREDS age related eye disease study; ARMD age related macular degeneration; POAG primary open angle glaucoma; EBMD epithelial/anterior basement membrane dystrophy; ACIOL anterior chamber intraocular lens; IOL intraocular lens; PCIOL posterior chamber intraocular lens; Phaco/IOL phacoemulsification with intraocular lens placement; [place] photorefractive keratectomy; LASIK laser assisted in situ keratomileusis; HTN hypertension; DM diabetes mellitus; COPD chronic obstructive pulmonary disease

## 2021-06-04 ENCOUNTER — Encounter (INDEPENDENT_AMBULATORY_CARE_PROVIDER_SITE_OTHER): Payer: Medicare Other | Admitting: Ophthalmology

## 2021-06-05 NOTE — Progress Notes (Signed)
Complete Physical  Assessment and Plan:  Grace [name] was seen today for annual exam.  Diagnoses and all orders for this visit:  Hyperlipidemia associated with type 2 diabetes mellitus ([place]) -     COMPLETE METABOLIC PANEL WITH GFR -     Lipid panel -     TSH Continue diet and exercise  Type 2 diabetes mellitus with stage 2 chronic kidney disease, without long-term current use of insulin (HCC) -     Urinalysis, Routine w reflex microscopic -     Microalbumin / creatinine urine ratio Continue medications, diet and exercise Discussed adding small dose of losartan for preserving kidney function, pt will consider  Diabetic polyneuropathy associated with diabetes mellitus due to underlying condition ([place]) Continue to control blood sugars Continue medications: Continue diet and exercise.  Perform daily foot/skin check, notify office of any concerning changes.  Check A1C   Type 2 diabetes mellitus with hyperlipidemia (HCC) -     Hemoglobin A1c Continue medications, diet and exercise  Diabetes mellitus type 2 with peripheral artery disease (HCC) -     Hemoglobin A1c Continue medications, diet and exercise  Diabetes mellitus with macular edema (HCC)  Continue to follow with Dr. [name], monitor and control blood sugars  Diabetic retinopathy of both eyes with macular edema associated with diabetes mellitus due to underlying condition, unspecified retinopathy severity ([place])  Continue to follow with Dr. [name], monitor and control blood sugars  Essential hypertension -     CBC with Differential/Platelet - Currently on no medications, continue DASH diet, exercise and monitor at home. Call if greater than 130/80.    Discuss addition of Losartan to lower BP and preserve kidney function, pt will consider  Vitamin D deficiency -     VITAMIN D 25 Hydroxy (Vit-D Deficiency, Fractures) Currently on no Vit D supplementation  Medication management -     Magnesium  Screening for hematuria or  proteinuria -     Urinalysis, Routine w reflex microscopic -     Microalbumin / creatinine urine ratio  Screening for ischemic heart disease -     EKG 12-Lead  Colon cancer screening -     Cologuard  Left cervical lymphadenopathy -     US SOFT TISSUE HEAD & NECK (NON-THYROID); Future Will treat pending results, may be benign lymph node  Skin tear of left lower leg without complication, initial encounter -     cephALEXin (KEFLEX) 500 MG capsule; Take 1 capsule (500 mg total) by mouth 4 (four) times daily for 10 days. Good skin care discussed, keep clean and dry.  Monitor and if no improvement call the office   Skin lesion of hand Send to Skin surgery center for evaluation and biopsy to rule out Squamous or basal cell CA  Discussed med's effects and SE's. Screening labs and tests as requested with regular follow-up as recommended. Over 40 minutes of exam, counseling, chart review, and complex, high level critical decision making was performed this visit.  Future Appointments  Date Time Provider [place]  06/11/2021  3:30 PM [name] Clent Demark, MD RDE-RDE None  06/17/2021  3:45 PM Rankin, Clent Demark, MD RDE-RDE None  01/22/2022 11:15 AM Suzzanne Cloud, NP GNA-GNA None    HPI  70 y.o. female  presents for a complete physical and follow up for has Diabetic polyneuropathy ([place]); Foot drop, bilateral; T2_NIDDM; Mixed hyperlipidemia; Vitamin D deficiency; Medication management; Essential hypertension; Fatty liver; Cholelithiases; Hyperlipidemia associated with type 2 diabetes mellitus ([place]); Diabetes mellitus  type 2 with peripheral artery disease ([place]); Diabetic macular edema of right eye with proliferative retinopathy associated with type 2 diabetes mellitus ([place]); Proliferative diabetic retinopathy of left eye with macular edema associated with type 2 diabetes mellitus ([place]); Right epiretinal membrane; Non-restorative sleep; Snoring; RLS (restless legs syndrome); OSA on CPAP; CKD stage 3 due  to type 2 diabetes mellitus ([place]); and Osteoporosis on their problem list..  Her blood pressure has been controlled at home, today their BP is BP: 140/70 BP Readings from Last 3 Encounters:  06/10/21 140/70  01/22/21 (!) 148/67  12/05/20 140/88    She does workout. She denies chest pain, shortness of breath, dizziness.   BMI is Body mass index is 28.76 kg/m., she has not been working on diet and exercise. Wt Readings from Last 3 Encounters:  06/10/21 170 lb 3.2 oz (77.2 kg)  01/22/21 174 lb (78.9 kg)  12/05/20 173 lb 9.6 oz (78.7 kg)    She is not on cholesterol medication and denies myalgias. Her cholesterol is at goal. She was prescribed Crestor in the past but never toook. The cholesterol last visit was:   Lab Results  Component Value Date   CHOL 192 12/05/2020   HDL 55 12/05/2020   LDLCALC 88 12/05/2020   TRIG 369 (H) 12/05/2020   CHOLHDL 3.5 12/05/2020    She has been working on diet and exercise for diabetes, she is not on bASA, she is not on ACE/ARB and denies hyperglycemia, increased appetite, paresthesia of the feet, and polyuria. Blood sugars have been running 110's. Last A1C in the office was:  Lab Results  Component Value Date   HGBA1C 7.5 (H) 12/05/2020   She has not been drinking many fluids throughout the day.  Last GFR: Lab Results  Component Value Date   GFRNONAA 59 (L) 12/05/2020   Lab Results  Component Value Date   GFRAA 69 12/05/2020    Patient is on Vitamin D supplement.   Lab Results  Component Value Date   VD25OH 73 12/05/2020      Current Medications:  Current Outpatient Medications on File Prior to Visit  Medication Sig Dispense Refill   Ascorbic Acid (VITAMIN C PO) Take 1 tablet by mouth daily.     Calcium 200 MG TABS Take by mouth. Unsure the mg     Cholecalciferol (VITAMIN D PO) Take 5,000 Units by mouth daily.     DULoxetine (CYMBALTA) 30 MG capsule Take 1 capsule (30 mg total) by mouth daily. 90 capsule 3   glimepiride (AMARYL) 2  MG tablet Take  1 tablet  2 x /day  with Meals  for Diabetes 180 tablet 3   glucose blood (ACCU-CHEK GUIDE) test strip Check blood sugar 3 times a day-DX-E11.69 AND E78.5. 100 each 12   Magnesium 250 MG TABS Take 2 tablets by mouth daily.      metFORMIN (GLUCOPHAGE-XR) 500 MG 24 hr tablet TAKE 2 TABLETS TWICE DAILY FOR DIABETES 360 tablet 1   Omega-3 Fatty Acids (OMEGA-3 FISH OIL PO) Take 1 capsule by mouth daily.     zinc gluconate 50 MG tablet Take 50 mg by mouth daily.     Calcium Citrate-Vitamin D (CITRACAL/VITAMIN D PO) Take 1 tablet by mouth daily. (Patient not taking: Reported on 06/10/2021)     No current facility-administered medications on file prior to visit.   Allergies:  Allergies  Allergen Reactions   Gabapentin Swelling    Swelling in legs and feet   Medical History:  She has Diabetic polyneuropathy ([place]); Foot drop, bilateral; T2_NIDDM; Mixed hyperlipidemia; Vitamin D deficiency; Medication management; Essential hypertension; Fatty liver; Cholelithiases; Hyperlipidemia associated with type 2 diabetes mellitus ([place]); Diabetes mellitus type 2 with peripheral artery disease ([place]); Diabetic macular edema of right eye with proliferative retinopathy associated with type 2 diabetes mellitus ([place]); Proliferative diabetic retinopathy of left eye with macular edema associated with type 2 diabetes mellitus ([place]); Right epiretinal membrane; Non-restorative sleep; Snoring; RLS (restless legs syndrome); OSA on CPAP; CKD stage 3 due to type 2 diabetes mellitus ([place]); and Osteoporosis on their problem list. Health Maintenance:   Immunization History  Administered Date(s) Administered   Influenza, High Dose Seasonal PF 05/12/2016, 07/03/2017, 05/24/2018, 06/10/2019, 05/02/2021   Influenza-Unspecified 07/03/2017   Pneumococcal Conjugate-13 02/07/2016   Pneumococcal Polysaccharide-23 09/03/2017   Td 02/07/2016    Tetanus:2017 Pneumovax: 2019 Prevnar 13: 2017 Flu vaccine:  05/02/21 Zostavax:declines LMP: n/a Pap: uncertain MGM: 09/24/04 bilateral mammogram and U/S due in 1 year DEXA: 07/2019 osteoporosis Colonoscopy: Cologuard 04/2018 negative due 2022 EGD:  Last Dental Exam: Last Eye Exam:Dr. Rankin sees monthly for diabetic retinopathy appt 06/11/21 Patient Care Team: Unk Pinto, MD as PCP - General (Internal Medicine) Hurman Horn, MD as Consulting Physician (Ophthalmology) Kathrynn Ducking, MD as Consulting Physician (Neurology) Monna Fam, MD as Consulting Physician (Ophthalmology) Earnstine Regal, PA-C as Physician Assistant (Obstetrics and Gynecology)  Surgical History:  She has a past surgical history that includes Tonsillectomy; Abdominal hysterectomy; Hip surgery; Ankle fracture surgery; Cataract extraction, bilateral; Cataract extraction w/PHACO (Right, 02/21/2014); and Cataract extraction w/PHACO (Left, 04/10/2014). Family History:  Herfamily history includes Diabetes in her brother and mother. Social History:  She reports that she has never smoked. She has never used smokeless tobacco. She reports that she does not drink alcohol and does not use drugs.  Review of Systems: Review of Systems  Constitutional:  Negative for malaise/fatigue and weight loss.  HENT:  Negative for congestion, hearing loss, sore throat and tinnitus.   Eyes:  Positive for blurred vision (retinopathy bilaterally).  Respiratory:  Negative for cough, sputum production, shortness of breath and wheezing.   Cardiovascular:  Negative for chest pain, palpitations, claudication and leg swelling.  Gastrointestinal:  Positive for constipation and heartburn. Negative for abdominal pain, diarrhea, nausea and vomiting.  Genitourinary:  Negative for dysuria.  Musculoskeletal:  Positive for joint pain. Negative for myalgias.  Skin:  Negative for rash.       Skin tear left 3rd toe, swollen area right middle finger  Neurological:  Positive for sensory change (feet and  legs are altered sensation) and weakness. Negative for dizziness.  Endo/Heme/Allergies:  Does not bruise/bleed easily.  Psychiatric/Behavioral:  Negative for depression. The patient does not have insomnia.    Physical Exam: Estimated body mass index is 28.76 kg/m as calculated from the following:   Height as of this encounter: 5' 4.5" (1.638 m).   Weight as of this encounter: 170 lb 3.2 oz (77.2 kg). BP 140/70   Pulse 75   Temp (!) 97.3 F (36.3 C)   Ht 5' 4.5" (1.638 m)   Wt 170 lb 3.2 oz (77.2 kg)   SpO2 99%   BMI 28.76 kg/m  General Appearance: Well nourished, in no apparent distress.  Eyes: PERRLA, EOMs, conjunctiva no swelling or erythema, normal fundi and vessels.  Sinuses: No Frontal/maxillary tenderness  ENT/Mouth: Ext aud canals clear, normal light reflex with TMs without erythema, bulging. Good dentition. No erythema, swelling, or exudate on post pharynx. Tonsils not  swollen or erythematous. Hearing normal.  Neck: Supple, thyroid normal. No bruits  Respiratory: Respiratory effort normal, BS equal bilaterally without rales, rhonchi, wheezing or stridor.  Cardio: RRR without murmurs, rubs or gallops. DP and PT slighlty diminished Chest: symmetric, with normal excursions and percussion.  Breasts: Defer to GYN Abdomen: Positive bowel sounds all 4 quadrants, Soft, nontender, no guarding, rebound, hernias, masses, or organomegaly.  Lymphatics: Non tender without lymphadenopathy.  Genitourinary:Defer Musculoskeletal: Full ROM all peripheral extremities,5/5 strength, and normal gait.  Skin: Feet and purple discoloration and cool to touch, Left 3rd toe has skin tear on dorsal side, slight purulent exudate noted. Right 3rd finger has a red,swollen area which is raised and has fluid , 1cm and raised approx 5 cm. Neuro: Cranial nerves intact, reflexes equal bilaterally. Normal muscle tone, no cerebellar symptoms. Sensation intact.  Psych: Awake and oriented X 3, normal affect, Insight  and Judgment appropriate.   EKG: WNL no ST changes. AORTA SCAN: defer   Grace [name] 10:12 AM  Adult & Adolescent Internal Medicine

## 2021-06-06 ENCOUNTER — Encounter: Payer: Medicare Other | Admitting: Internal Medicine

## 2021-06-10 ENCOUNTER — Other Ambulatory Visit: Payer: Self-pay

## 2021-06-10 ENCOUNTER — Ambulatory Visit (INDEPENDENT_AMBULATORY_CARE_PROVIDER_SITE_OTHER): Payer: Medicare Other | Admitting: Nurse Practitioner

## 2021-06-10 ENCOUNTER — Encounter: Payer: Self-pay | Admitting: Nurse Practitioner

## 2021-06-10 VITALS — BP 140/70 | HR 75 | Temp 97.3°F | Ht 64.5 in | Wt 170.2 lb

## 2021-06-10 DIAGNOSIS — L989 Disorder of the skin and subcutaneous tissue, unspecified: Secondary | ICD-10-CM

## 2021-06-10 DIAGNOSIS — E1122 Type 2 diabetes mellitus with diabetic chronic kidney disease: Secondary | ICD-10-CM | POA: Diagnosis not present

## 2021-06-10 DIAGNOSIS — S81812A Laceration without foreign body, left lower leg, initial encounter: Secondary | ICD-10-CM

## 2021-06-10 DIAGNOSIS — E11311 Type 2 diabetes mellitus with unspecified diabetic retinopathy with macular edema: Secondary | ICD-10-CM

## 2021-06-10 DIAGNOSIS — E559 Vitamin D deficiency, unspecified: Secondary | ICD-10-CM | POA: Diagnosis not present

## 2021-06-10 DIAGNOSIS — I10 Essential (primary) hypertension: Secondary | ICD-10-CM

## 2021-06-10 DIAGNOSIS — Z1211 Encounter for screening for malignant neoplasm of colon: Secondary | ICD-10-CM

## 2021-06-10 DIAGNOSIS — N182 Chronic kidney disease, stage 2 (mild): Secondary | ICD-10-CM | POA: Diagnosis not present

## 2021-06-10 DIAGNOSIS — Z1389 Encounter for screening for other disorder: Secondary | ICD-10-CM | POA: Diagnosis not present

## 2021-06-10 DIAGNOSIS — E1151 Type 2 diabetes mellitus with diabetic peripheral angiopathy without gangrene: Secondary | ICD-10-CM | POA: Diagnosis not present

## 2021-06-10 DIAGNOSIS — Z136 Encounter for screening for cardiovascular disorders: Secondary | ICD-10-CM | POA: Diagnosis not present

## 2021-06-10 DIAGNOSIS — E08311 Diabetes mellitus due to underlying condition with unspecified diabetic retinopathy with macular edema: Secondary | ICD-10-CM

## 2021-06-10 DIAGNOSIS — Z0000 Encounter for general adult medical examination without abnormal findings: Secondary | ICD-10-CM | POA: Diagnosis not present

## 2021-06-10 DIAGNOSIS — E785 Hyperlipidemia, unspecified: Secondary | ICD-10-CM | POA: Diagnosis not present

## 2021-06-10 DIAGNOSIS — Z79899 Other long term (current) drug therapy: Secondary | ICD-10-CM | POA: Diagnosis not present

## 2021-06-10 DIAGNOSIS — R590 Localized enlarged lymph nodes: Secondary | ICD-10-CM

## 2021-06-10 DIAGNOSIS — E1169 Type 2 diabetes mellitus with other specified complication: Secondary | ICD-10-CM

## 2021-06-10 DIAGNOSIS — E0842 Diabetes mellitus due to underlying condition with diabetic polyneuropathy: Secondary | ICD-10-CM

## 2021-06-10 MED ORDER — CEPHALEXIN 500 MG PO CAPS: 500.0000 mg | ORAL_CAPSULE | Freq: Four times a day (QID) | ORAL | 0 refills | Status: AC

## 2021-06-10 NOTE — Patient Instructions (Addendum)
YOU CAN CALL TO MAKE AN ULTRASOUND.. I have put in an order for an ultrasound for you to have You can set them up at your convenience by calling this number [phone] You will likely have the ultrasound at [facility]  If you have any issues call our office and we will set this up for you.     Losartan is Blood Pressure medicine I would like you to consider for preservation of kidney function   Constipation, Adult Constipation is when a person has fewer than three bowel movements in a week, has difficulty having a bowel movement, or has stools (feces) that are dry, hard, or larger than normal. Constipation may be caused by an underlying condition. It may become worse with age if a person takes certain medicines and does not take in enough fluids. Follow these instructions at home: Eating and drinking  Eat foods that have a lot of fiber, such as beans, whole grains, and fresh fruits and vegetables. Limit foods that are low in fiber and high in fat and processed sugars, such as fried or sweet foods. These include french fries, hamburgers, cookies, candies, and soda. Drink enough fluid to keep your urine pale yellow. General instructions Exercise regularly or as told by your health care provider. Try to do 150 minutes of moderate exercise each week. Use the bathroom when you have the urge to go. Do not hold it in. Take over-the-counter and prescription medicines only as told by your health care provider. This includes any fiber supplements. During bowel movements: Practice deep breathing while relaxing the lower abdomen. Practice pelvic floor relaxation. Watch your condition for any changes. Let your health care provider know about them. Keep all follow-up visits as told by your health care provider. This is important. Contact a health care provider if: You have pain that gets worse. You have a fever. You do not have a bowel movement after 4 days. You vomit. You are  not hungry or you lose weight. You are bleeding from the opening between the buttocks (anus). You have thin, pencil-like stools. Get help right away if: You have a fever and your symptoms suddenly get worse. You leak stool or have blood in your stool. Your abdomen is bloated. You have severe pain in your abdomen. You feel dizzy or you faint. Summary Constipation is when a person has fewer than three bowel movements in a week, has difficulty having a bowel movement, or has stools (feces) that are dry, hard, or larger than normal. Eat foods that have a lot of fiber, such as beans, whole grains, and fresh fruits and vegetables. Drink enough fluid to keep your urine pale yellow. Take over-the-counter and prescription medicines only as told by your health care provider. This includes any fiber supplements. This information is not intended to replace advice given to you by your health care provider. Make sure you discuss any questions you have with your health care provider. Document Revised: 06/22/2019 Document Reviewed: 06/22/2019 Elsevier Patient Education  [place].

## 2021-06-10 NOTE — Addendum Note (Signed)
Addended by: Magda Bernheim on: 06/10/2021 03:19 PM   Modules accepted: Orders

## 2021-06-11 ENCOUNTER — Ambulatory Visit (INDEPENDENT_AMBULATORY_CARE_PROVIDER_SITE_OTHER): Payer: Medicare Other | Admitting: Ophthalmology

## 2021-06-11 ENCOUNTER — Other Ambulatory Visit: Payer: Self-pay | Admitting: Nurse Practitioner

## 2021-06-11 ENCOUNTER — Encounter (INDEPENDENT_AMBULATORY_CARE_PROVIDER_SITE_OTHER): Payer: Self-pay | Admitting: Ophthalmology

## 2021-06-11 DIAGNOSIS — E113512 Type 2 diabetes mellitus with proliferative diabetic retinopathy with macular edema, left eye: Secondary | ICD-10-CM | POA: Diagnosis not present

## 2021-06-11 DIAGNOSIS — E113511 Type 2 diabetes mellitus with proliferative diabetic retinopathy with macular edema, right eye: Secondary | ICD-10-CM | POA: Diagnosis not present

## 2021-06-11 DIAGNOSIS — R8290 Unspecified abnormal findings in urine: Secondary | ICD-10-CM | POA: Diagnosis not present

## 2021-06-11 LAB — CBC WITH DIFFERENTIAL/PLATELET
Absolute Monocytes: 450 cells/uL (ref 200–950)
Basophils Absolute: 63 cells/uL (ref 0–200)
Basophils Relative: 0.8 %
Eosinophils Absolute: 269 cells/uL (ref 15–500)
Eosinophils Relative: 3.4 %
HCT: 38.7 % (ref 35.0–45.0)
Hemoglobin: 12.5 g/dL (ref 11.7–15.5)
Lymphs Abs: 2101 cells/uL (ref 850–3900)
MCH: 30.9 pg (ref 27.0–33.0)
MCHC: 32.3 g/dL (ref 32.0–36.0)
MCV: 95.8 fL (ref 80.0–100.0)
MPV: 9.3 fL (ref 7.5–12.5)
Monocytes Relative: 5.7 %
Neutro Abs: 5017 cells/uL (ref 1500–7800)
Neutrophils Relative %: 63.5 %
Platelets: 405 10*3/uL — ABNORMAL HIGH (ref 140–400)
RBC: 4.04 10*6/uL (ref 3.80–5.10)
RDW: 12.6 % (ref 11.0–15.0)
Total Lymphocyte: 26.6 %
WBC: 7.9 10*3/uL (ref 3.8–10.8)

## 2021-06-11 LAB — MAGNESIUM: Magnesium: 1.6 mg/dL (ref 1.5–2.5)

## 2021-06-11 LAB — HEMOGLOBIN A1C
Hgb A1c MFr Bld: 7.5 % of total Hgb — ABNORMAL HIGH (ref <5.7)
Mean Plasma Glucose: 169 mg/dL
eAG (mmol/L): 9.3 mmol/L

## 2021-06-11 LAB — LIPID PANEL
Cholesterol: 172 mg/dL (ref <200)
HDL: 57 mg/dL (ref >=50)
LDL Cholesterol (Calc): 84 mg/dL (calc)
Non-HDL Cholesterol (Calc): 115 mg/dL (calc) (ref <130)
Total CHOL/HDL Ratio: 3 (calc) (ref <5.0)
Triglycerides: 221 mg/dL — ABNORMAL HIGH (ref <150)

## 2021-06-11 LAB — URINALYSIS, ROUTINE W REFLEX MICROSCOPIC
Bilirubin Urine: NEGATIVE
Glucose, UA: NEGATIVE
Hgb urine dipstick: NEGATIVE
Hyaline Cast: NONE SEEN /LPF
Nitrite: NEGATIVE
Protein, ur: NEGATIVE
Specific Gravity, Urine: 1.022 (ref 1.001–1.035)
pH: 6 (ref 5.0–8.0)

## 2021-06-11 LAB — COMPLETE METABOLIC PANEL WITH GFR
AG Ratio: 1.7 (calc) (ref 1.0–2.5)
ALT: 20 U/L (ref 6–29)
AST: 16 U/L (ref 10–35)
Albumin: 4 g/dL (ref 3.6–5.1)
Alkaline phosphatase (APISO): 49 U/L (ref 37–153)
BUN: 24 mg/dL (ref 7–25)
CO2: 33 mmol/L — ABNORMAL HIGH (ref 20–32)
Calcium: 10.4 mg/dL (ref 8.6–10.4)
Chloride: 101 mmol/L (ref 98–110)
Creat: 0.95 mg/dL (ref 0.60–1.00)
Globulin: 2.3 g/dL (calc) (ref 1.9–3.7)
Glucose, Bld: 157 mg/dL — ABNORMAL HIGH (ref 65–99)
Potassium: 5.6 mmol/L — ABNORMAL HIGH (ref 3.5–5.3)
Sodium: 141 mmol/L (ref 135–146)
Total Bilirubin: 0.3 mg/dL (ref 0.2–1.2)
Total Protein: 6.3 g/dL (ref 6.1–8.1)
eGFR: 64 mL/min/{1.73_m2} (ref >=60)

## 2021-06-11 LAB — MICROALBUMIN / CREATININE URINE RATIO
Creatinine, Urine: 142 mg/dL (ref 20–275)
Microalb Creat Ratio: 3 mcg/mg creat (ref <30)
Microalb, Ur: 0.4 mg/dL

## 2021-06-11 LAB — TSH: TSH: 1.62 mIU/L (ref 0.40–4.50)

## 2021-06-11 LAB — VITAMIN D 25 HYDROXY (VIT D DEFICIENCY, FRACTURES): Vit D, 25-Hydroxy: 68 ng/mL (ref 30–100)

## 2021-06-11 MED ORDER — AFLIBERCEPT 2MG/0.05ML IZ SOLN FOR KALEIDOSCOPE
2.0000 mg | INTRAVITREAL | Status: AC | PRN
  Administered 2021-06-11: 2 mg via INTRAVITREAL

## 2021-06-11 NOTE — Progress Notes (Signed)
06/11/2021     CHIEF COMPLAINT Patient presents for  Chief Complaint  Patient presents with   Retina Follow Up      HISTORY OF PRESENT ILLNESS: Grace [name] is a 70 y.o. female who presents to the clinic today for:   HPI     Retina Follow Up   Patient presents with  Diabetic Retinopathy.  In left eye.  This started 8 weeks ago.  Severity is mild.  Duration of 8 weeks.  Since onset it is stable.        Comments   8 week fu os and oct and Eylea os Pt states, "I still have the same spider webs in my right eye that have been there for the last couple of months. No other changes." A1C:7.5 LBS:140      Last edited by Kendra Opitz, COA on 06/11/2021  3:33 PM.      Referring physician: Unk Pinto, MD [address] [place],  [place] [address]  HISTORICAL INFORMATION:   Selected notes from the MEDICAL RECORD NUMBER    Lab Results  Component Value Date   HGBA1C 7.5 (H) 06/10/2021     CURRENT MEDICATIONS: No current outpatient medications on file. (Ophthalmic Drugs)   No current facility-administered medications for this visit. (Ophthalmic Drugs)   Current Outpatient Medications (Other)  Medication Sig   Ascorbic Acid (VITAMIN C PO) Take 1 tablet by mouth daily.   Calcium 200 MG TABS Take by mouth. Unsure the mg   cephALEXin (KEFLEX) 500 MG capsule Take 1 capsule (500 mg total) by mouth 4 (four) times daily for 10 days.   Cholecalciferol (VITAMIN D PO) Take 5,000 Units by mouth daily.   DULoxetine (CYMBALTA) 30 MG capsule Take 1 capsule (30 mg total) by mouth daily.   glimepiride (AMARYL) 2 MG tablet Take  1 tablet  2 x /day  with Meals  for Diabetes   glucose blood (ACCU-CHEK GUIDE) test strip Check blood sugar 3 times a day-DX-E11.69 AND E78.5.   Magnesium 250 MG TABS Take 2 tablets by mouth daily.    metFORMIN (GLUCOPHAGE-XR) 500 MG 24 hr tablet TAKE 2 TABLETS TWICE DAILY FOR DIABETES   Omega-3 Fatty Acids (OMEGA-3 FISH OIL PO)  Take 1 capsule by mouth daily.   zinc gluconate 50 MG tablet Take 50 mg by mouth daily.   No current facility-administered medications for this visit. (Other)      REVIEW OF SYSTEMS:    ALLERGIES Allergies  Allergen Reactions   Gabapentin Swelling    Swelling in legs and feet    PAST MEDICAL HISTORY Past Medical History:  Diagnosis Date   Abnormality of gait 07/28/2013   Diabetes ([place])    Diabetic peripheral neuropathy (HCC)    Diabetic retinopathy ([place])    Foot drop, bilateral 07/31/2014   Peripheral edema    Polyneuropathy in diabetes(357.2) 07/28/2013   Past Surgical History:  Procedure Laterality Date   ABDOMINAL HYSTERECTOMY     ANKLE FRACTURE SURGERY     left   CATARACT EXTRACTION W/PHACO Right 02/21/2014   Dr. Herbert Deaner   CATARACT EXTRACTION W/PHACO Left 04/10/2014   Dr. Herbert Deaner   CATARACT EXTRACTION, [place]     right   TONSILLECTOMY      FAMILY HISTORY Family History  Problem Relation Age of Onset   Diabetes Mother    Diabetes Brother    Neuropathy Neg Hx     SOCIAL HISTORY Social  History   Tobacco Use   Smoking status: Never   Smokeless tobacco: Never  Substance Use Topics   Alcohol use: No   Drug use: No         OPHTHALMIC EXAM:  Base Eye Exam     Visual Acuity (ETDRS)       Right Left   Dist [place] 20/50 -1 20/80 -1   Dist ph [place] NI 20/50         Tonometry (Tonopen, 3:37 PM)       Right Left   Pressure 22 21         Pupils       Pupils Dark Light Shape React APD   Right PERRL 3 3 Round Minimal None   Left PERRL 3 3 Round Minimal None         Visual Fields       Left Right   Restrictions Partial outer superior temporal deficiency          Extraocular Movement       Right Left    Full Full         Neuro/Psych     Oriented x3: Yes   Mood/Affect: Normal         Dilation     Left eye: 1.0% Mydriacyl, 2.5% Phenylephrine @ 3:37 PM           Slit Lamp and Fundus Exam      External Exam       Right Left   External Normal Normal         Slit Lamp Exam       Right Left   Lids/Lashes Normal Normal   Conjunctiva/Sclera White and quiet White and quiet   Cornea Clear Clear   Anterior Chamber Deep and quiet Deep and quiet   Iris Round and reactive Round and reactive   Lens Posterior chamber intraocular lens Posterior chamber intraocular lens   Anterior Vitreous Normal Normal         Fundus Exam       Right Left   Posterior Vitreous  Posterior vitreous detachment   Disc  Normal   C/D Ratio  0.25   Macula  Microaneurysms, Macular thickening, Clinically significant macular edema, Mild clinically significant macular edema   Vessels  Severe nonproliferative diabetic retinopathy with retinal nonperfusion   Periphery  Good peripheral PRP            IMAGING AND PROCEDURES  Imaging and Procedures for 06/11/21  OCT, Retina - OU - Both Eyes       Right Eye Quality was good. Scan locations included subfoveal. Central Foveal Thickness: 297. Progression has improved. Findings include abnormal foveal contour.   Left Eye Quality was good. Scan locations included subfoveal. Central Foveal Thickness: 299. Progression has improved. Findings include abnormal foveal contour.   Notes OD 4 week postinjection for diabetic CSME.  Epiretinal membrane remains.  Repeat follow-up OD as scheduled vastly improved overall  OS also spontaneous improvement, today at  8 weeks postinjection OS follow-up as scheduled, much improved overall and sustained improvement at 8 weeks this visit.     Intravitreal Injection, Pharmacologic Agent - OS - Left Eye       Time Out 06/11/2021. 4:43 PM. Confirmed correct patient, procedure, site, and patient consented.   Anesthesia Topical anesthesia was used. Anesthetic medications included [place] 3.5%.   Procedure Preparation included Tobramycin 0.3%, 5% betadine to ocular surface, 10% betadine to eyelids, Ofloxacin . A 30  gauge  needle was used.   Injection: 2 mg aflibercept 2 MG/0.05ML   Route: Intravitreal, Site: Left Eye   NDC: [id number], Lot: [id number], Waste: 0 mL   Post-op Post injection exam found visual acuity of at least counting fingers. The patient tolerated the procedure well. There were no complications. The patient received written and verbal post procedure care education. Post injection medications included ocuflox.              ASSESSMENT/PLAN:  Diabetic macular edema of right eye with proliferative retinopathy associated with type 2 diabetes mellitus ([place])  The nature of diabetic macular edema was discussed with the patient. Treatment options were outlined including medical therapy, laser & vitrectomy. The use of injectable medications reviewed, including Avastin, Lucentis, and Eylea. Periodic injections into the eye are likely to resolve diabetic macular edema (swelling in the center of vision). Initially, injections are delivered are delivered every 4-6 weeks, and the interval extended as the condition improves. On average, 8-9 injections the first year, and 5 in year 2. Improvement in the condition most often improves on medical therapy. Occasional use of focal laser is also recommended for residual macular edema (swelling). Excellent control of blood glucose and blood pressure are encouraged under the care of a primary physician or endocrinologist. Similarly, attempts to maintain serum cholesterol, low density lipoproteins, and high-density lipoproteins in a favorable range were recommended.   OD sustained less CME post treatment injection into vegF some 4 weeks previous follow-up as scheduled  Proliferative diabetic retinopathy of left eye with macular edema associated with type 2 diabetes mellitus (HCC) CSME OS much improved compared to 8 weeks previous post Eylea injection repeat injection today and maintain 8-week follow-up OS     ICD-10-CM   1. Proliferative diabetic retinopathy of  left eye with macular edema associated with type 2 diabetes mellitus (HCC)  P82.4235 OCT, Retina - OU - Both Eyes    Intravitreal Injection, Pharmacologic Agent - OS - Left Eye    aflibercept (EYLEA) SOLN 2 mg    2. Diabetic macular edema of right eye with proliferative retinopathy associated with type 2 diabetes mellitus ([place])  E11.3511       1.  Much improved CSME OS post injection intravitreal Eylea some 8 weeks previous.  We will maintain same interval follow-up after injection OS today  2.  OD at 4 weeks postinjection Eylea sustained CSME resolution we will continue to monitor and follow-up as scheduled  3.  Ophthalmic Meds Ordered this visit:  Meds ordered this encounter  Medications   aflibercept (EYLEA) SOLN 2 mg       Return in about 8 weeks (around 08/06/2021) for dilate, OS, EYLEA OCT,, and follow-up OD as scheduled.  There are no Patient Instructions on file for this visit.   Explained the diagnoses, plan, and follow up with the patient and they expressed understanding.  Patient expressed understanding of the importance of proper follow up care.   Clent Demark Irene Mitcham M.D. Diseases & Surgery of the Retina and Vitreous Retina & Diabetic [place] 06/11/21     Abbreviations: M myopia (nearsighted); A astigmatism; H hyperopia (farsighted); P presbyopia; Mrx spectacle prescription;  CTL contact lenses; OD right eye; OS left eye; OU both eyes  XT exotropia; ET esotropia; PEK punctate epithelial keratitis; PEE punctate epithelial erosions; DES dry eye syndrome; MGD meibomian gland dysfunction; ATs artificial tears; PFAT's preservative free artificial tears; [place] nuclear sclerotic cataract; PSC posterior subcapsular cataract; ERM epi-retinal membrane; PVD posterior vitreous  detachment; RD retinal detachment; DM diabetes mellitus; DR diabetic retinopathy; NPDR non-proliferative diabetic retinopathy; PDR proliferative diabetic retinopathy; CSME clinically significant macular edema;  DME diabetic macular edema; dbh dot blot hemorrhages; CWS cotton wool spot; POAG primary open angle glaucoma; C/D cup-to-disc ratio; HVF humphrey visual field; GVF goldmann visual field; OCT optical coherence tomography; IOP intraocular pressure; BRVO Branch retinal vein occlusion; CRVO central retinal vein occlusion; CRAO central retinal artery occlusion; BRAO branch retinal artery occlusion; RT retinal tear; SB scleral buckle; PPV pars plana vitrectomy; VH Vitreous hemorrhage; PRP panretinal laser photocoagulation; IVK intravitreal kenalog; VMT vitreomacular traction; MH Macular hole;  NVD neovascularization of the disc; NVE neovascularization elsewhere; AREDS age related eye disease study; ARMD age related macular degeneration; POAG primary open angle glaucoma; EBMD epithelial/anterior basement membrane dystrophy; ACIOL anterior chamber intraocular lens; IOL intraocular lens; PCIOL posterior chamber intraocular lens; Phaco/IOL phacoemulsification with intraocular lens placement; [place] photorefractive keratectomy; LASIK laser assisted in situ keratomileusis; HTN hypertension; DM diabetes mellitus; COPD chronic obstructive pulmonary disease

## 2021-06-11 NOTE — Assessment & Plan Note (Signed)
CSME OS much improved compared to 8 weeks previous post Eylea injection repeat injection today and maintain 8-week follow-up OS

## 2021-06-11 NOTE — Assessment & Plan Note (Signed)
The nature of diabetic macular edema was discussed with the patient. Treatment options were outlined including medical therapy, laser & vitrectomy. The use of injectable medications reviewed, including Avastin, Lucentis, and Eylea. Periodic injections into the eye are likely to resolve diabetic macular edema (swelling in the center of vision). Initially, injections are delivered are delivered every 4-6 weeks, and the interval extended as the condition improves. On average, 8-9 injections the first year, and 5 in year 2. Improvement in the condition most often improves on medical therapy. Occasional use of focal laser is also recommended for residual macular edema (swelling). Excellent control of blood glucose and blood pressure are encouraged under the care of a primary physician or endocrinologist. Similarly, attempts to maintain serum cholesterol, low density lipoproteins, and high-density lipoproteins in a favorable range were recommended.   OD sustained less CME post treatment injection into vegF some 4 weeks previous follow-up as scheduled

## 2021-06-12 LAB — URINE CULTURE
MICRO NUMBER:: 12548725
Result:: NO GROWTH
SPECIMEN QUALITY:: ADEQUATE

## 2021-06-14 DIAGNOSIS — Z1211 Encounter for screening for malignant neoplasm of colon: Secondary | ICD-10-CM | POA: Diagnosis not present

## 2021-06-17 ENCOUNTER — Encounter (INDEPENDENT_AMBULATORY_CARE_PROVIDER_SITE_OTHER): Payer: Medicare Other | Admitting: Ophthalmology

## 2021-06-17 ENCOUNTER — Other Ambulatory Visit: Payer: Self-pay

## 2021-06-17 ENCOUNTER — Encounter (INDEPENDENT_AMBULATORY_CARE_PROVIDER_SITE_OTHER): Payer: Self-pay | Admitting: Ophthalmology

## 2021-06-17 ENCOUNTER — Ambulatory Visit (INDEPENDENT_AMBULATORY_CARE_PROVIDER_SITE_OTHER): Payer: Medicare Other | Admitting: Ophthalmology

## 2021-06-17 DIAGNOSIS — Z9989 Dependence on other enabling machines and devices: Secondary | ICD-10-CM

## 2021-06-17 DIAGNOSIS — E113511 Type 2 diabetes mellitus with proliferative diabetic retinopathy with macular edema, right eye: Secondary | ICD-10-CM | POA: Diagnosis not present

## 2021-06-17 DIAGNOSIS — G4733 Obstructive sleep apnea (adult) (pediatric): Secondary | ICD-10-CM | POA: Diagnosis not present

## 2021-06-17 DIAGNOSIS — E113512 Type 2 diabetes mellitus with proliferative diabetic retinopathy with macular edema, left eye: Secondary | ICD-10-CM | POA: Diagnosis not present

## 2021-06-17 MED ORDER — AFLIBERCEPT 2MG/0.05ML IZ SOLN FOR KALEIDOSCOPE
2.0000 mg | INTRAVITREAL | Status: AC | PRN
  Administered 2021-06-17: 2 mg via INTRAVITREAL

## 2021-06-17 NOTE — Assessment & Plan Note (Signed)
Patient continues to do well with compliance on CPAP BiPAP

## 2021-06-17 NOTE — Assessment & Plan Note (Signed)
OS improved 6 days post most recent injection with small amount of CME temporally resolved.  Follow-up as scheduled OS

## 2021-06-17 NOTE — Assessment & Plan Note (Signed)
OD, vastly improved overall currently at 5-week follow-up.  We will repeat injection today and examination next in 5 to 6 weeks

## 2021-06-17 NOTE — Progress Notes (Signed)
06/17/2021     CHIEF COMPLAINT Patient presents for  Chief Complaint  Patient presents with   Retina Follow Up      HISTORY OF PRESENT ILLNESS: Grace [name] is a 70 y.o. female who presents to the clinic today for:   HPI     Retina Follow Up   Patient presents with  Diabetic Retinopathy.  In right eye.  This started 5 weeks ago.  Severity is mild.  Duration of 5 weeks.  Since onset it is stable.        Comments   5 weeks fu OD oct Eylea OD. Pt states "my vision seems a little better. I still have the spider webs in my vision." Denies new FOL or floaters.      Last edited by Laurin Coder on 06/17/2021  3:37 PM.      Referring physician: Unk Pinto, MD [address] [place],  [place] [address]  HISTORICAL INFORMATION:   Selected notes from the MEDICAL RECORD NUMBER    Lab Results  Component Value Date   HGBA1C 7.5 (H) 06/10/2021     CURRENT MEDICATIONS: No current outpatient medications on file. (Ophthalmic Drugs)   No current facility-administered medications for this visit. (Ophthalmic Drugs)   Current Outpatient Medications (Other)  Medication Sig   Ascorbic Acid (VITAMIN C PO) Take 1 tablet by mouth daily.   Calcium 200 MG TABS Take by mouth. Unsure the mg   cephALEXin (KEFLEX) 500 MG capsule Take 1 capsule (500 mg total) by mouth 4 (four) times daily for 10 days.   Cholecalciferol (VITAMIN D PO) Take 5,000 Units by mouth daily.   DULoxetine (CYMBALTA) 30 MG capsule Take 1 capsule (30 mg total) by mouth daily.   glimepiride (AMARYL) 2 MG tablet Take  1 tablet  2 x /day  with Meals  for Diabetes   glucose blood (ACCU-CHEK GUIDE) test strip Check blood sugar 3 times a day-DX-E11.69 AND E78.5.   Magnesium 250 MG TABS Take 2 tablets by mouth daily.    metFORMIN (GLUCOPHAGE-XR) 500 MG 24 hr tablet TAKE 2 TABLETS TWICE DAILY FOR DIABETES   Omega-3 Fatty Acids (OMEGA-3 FISH OIL PO) Take 1 capsule by mouth daily.   zinc  gluconate 50 MG tablet Take 50 mg by mouth daily.   No current facility-administered medications for this visit. (Other)      REVIEW OF SYSTEMS: ROS   Negative for: Constitutional, Gastrointestinal, Neurological, Skin, Genitourinary, Musculoskeletal, HENT, Endocrine, Cardiovascular, Eyes, Respiratory, Psychiatric, Allergic/Imm, Heme/Lymph Last edited by Hurman Horn, MD on 06/17/2021  4:09 PM.       ALLERGIES Allergies  Allergen Reactions   Gabapentin Swelling    Swelling in legs and feet    PAST MEDICAL HISTORY Past Medical History:  Diagnosis Date   Abnormality of gait 07/28/2013   Diabetes ([place])    Diabetic peripheral neuropathy ([place])    Diabetic retinopathy ([place])    Foot drop, bilateral 07/31/2014   Peripheral edema    Polyneuropathy in diabetes(357.2) 07/28/2013   Past Surgical History:  Procedure Laterality Date   ABDOMINAL HYSTERECTOMY     ANKLE FRACTURE SURGERY     left   CATARACT EXTRACTION W/PHACO Right 02/21/2014   Dr. Herbert Deaner   CATARACT EXTRACTION W/PHACO Left 04/10/2014   Dr. Herbert Deaner   CATARACT EXTRACTION, BILATERAL     HIP SURGERY     right   TONSILLECTOMY      FAMILY HISTORY Family History  Problem Relation  Age of Onset   Diabetes Mother    Diabetes Brother    Neuropathy Neg Hx     SOCIAL HISTORY Social History   Tobacco Use   Smoking status: Never   Smokeless tobacco: Never  Substance Use Topics   Alcohol use: No   Drug use: No         OPHTHALMIC EXAM:  Base Eye Exam     Visual Acuity (ETDRS)       Right Left   Dist [place] 20/50 -1 20/60 -1   Dist ph [place] 20/30 -1 20/60 +2         Tonometry (Tonopen, 3:41 PM)       Right Left   Pressure 13 15         Pupils       Pupils Dark Light Shape React APD   Right PERRL 3 3 Round Minimal None   Left PERRL 3 3 Round Minimal None         Extraocular Movement       Right Left    Full Full         Neuro/Psych     Oriented x3: Yes   Mood/Affect: Normal          Dilation     Right eye: 1.0% Mydriacyl, 2.5% Phenylephrine @ 3:41 PM           Slit Lamp and Fundus Exam     External Exam       Right Left   External Normal Normal         Slit Lamp Exam       Right Left   Lids/Lashes Normal Normal   Conjunctiva/Sclera White and quiet White and quiet   Cornea Clear Clear   Anterior Chamber Deep and quiet Deep and quiet   Iris Round and reactive Round and reactive   Lens Posterior chamber intraocular lens Posterior chamber intraocular lens   Anterior Vitreous Normal Normal         Fundus Exam       Right Left   Posterior Vitreous Posterior vitreous detachment    Disc Normal    C/D Ratio 0.3    Macula Epiretinal membrane, good focal laser, Macular thickening minor    Vessels PDR-quiet    Periphery good PRP,, ATTACHED             IMAGING AND PROCEDURES  Imaging and Procedures for 06/17/21  OCT, Retina - OU - Both Eyes       Right Eye Quality was good. Scan locations included subfoveal. Central Foveal Thickness: 304. Progression has improved. Findings include abnormal foveal contour.   Left Eye Quality was good. Scan locations included subfoveal. Central Foveal Thickness: 300. Progression has improved. Findings include abnormal foveal contour.   Notes OD 5 week postinjection for diabetic CSME.  Epiretinal membrane remains.  Repeat injection intravitreal Eylea today at 5 weeks and follow-up again in 5 to 6 weeks  OS also spontaneous improvement, today at 6 days postinjection OS,  follow-up as scheduled, much improved overall and sustained improvement at 8 weeks this visit.     Intravitreal Injection, Pharmacologic Agent - OD - Right Eye       Time Out 06/17/2021. 4:06 PM. Confirmed correct patient, procedure, site, and patient consented.   Procedure Injection: 2 mg aflibercept 2 MG/0.05ML   Route: Intravitreal, Site: Right Eye   NDC: [id number], Lot: [id number], Waste: 0 mL   Post-op Post injection  exam found visual  acuity of at least counting fingers. The patient tolerated the procedure well. There were no complications. The patient received written and verbal post procedure care education. Post injection medications were not given.              ASSESSMENT/PLAN:  OSA on CPAP Patient continues to do well with compliance on CPAP BiPAP  Proliferative diabetic retinopathy of left eye with macular edema associated with type 2 diabetes mellitus (HCC) OS improved 6 days post most recent injection with small amount of CME temporally resolved.  Follow-up as scheduled OS  Diabetic macular edema of right eye with proliferative retinopathy associated with type 2 diabetes mellitus ([place]) OD, vastly improved overall currently at 5-week follow-up.  We will repeat injection today and examination next in 5 to 6 weeks     ICD-10-CM   1. Diabetic macular edema of right eye with proliferative retinopathy associated with type 2 diabetes mellitus (HCC)  E11.3511 OCT, Retina - OU - Both Eyes    Intravitreal Injection, Pharmacologic Agent - OD - Right Eye    aflibercept (EYLEA) SOLN 2 mg    2. OSA on CPAP  G47.33    Z99.89     3. Proliferative diabetic retinopathy of left eye with macular edema associated with type 2 diabetes mellitus ([place])  V37.1062       1.  OD vastly improved overall intravitreal Eylea with preserved acuity.  Repeat injection today and follow-up again in 5 to 6 weeks  2.  OS also slightly improved at 6 days postinjection follow-up as scheduled  3.  Ophthalmic Meds Ordered this visit:  Meds ordered this encounter  Medications   aflibercept (EYLEA) SOLN 2 mg       Return in about 5 weeks (around 07/22/2021) for RV 5 to 6 weeks, dilate, OD, EYLEA OCT, and OS as scheduled.  There are no Patient Instructions on file for this visit.   Explained the diagnoses, plan, and follow up with the patient and they expressed understanding.  Patient expressed understanding of the  importance of proper follow up care.   Clent Demark Britni Driscoll M.D. Diseases & Surgery of the Retina and Vitreous Retina & Diabetic [place] 06/17/21     Abbreviations: M myopia (nearsighted); A astigmatism; H hyperopia (farsighted); P presbyopia; Mrx spectacle prescription;  CTL contact lenses; OD right eye; OS left eye; OU both eyes  XT exotropia; ET esotropia; PEK punctate epithelial keratitis; PEE punctate epithelial erosions; DES dry eye syndrome; MGD meibomian gland dysfunction; ATs artificial tears; PFAT's preservative free artificial tears; [place] nuclear sclerotic cataract; PSC posterior subcapsular cataract; ERM epi-retinal membrane; PVD posterior vitreous detachment; RD retinal detachment; DM diabetes mellitus; DR diabetic retinopathy; NPDR non-proliferative diabetic retinopathy; PDR proliferative diabetic retinopathy; CSME clinically significant macular edema; DME diabetic macular edema; dbh dot blot hemorrhages; CWS cotton wool spot; POAG primary open angle glaucoma; C/D cup-to-disc ratio; HVF humphrey visual field; GVF goldmann visual field; OCT optical coherence tomography; IOP intraocular pressure; BRVO Branch retinal vein occlusion; CRVO central retinal vein occlusion; CRAO central retinal artery occlusion; BRAO branch retinal artery occlusion; RT retinal tear; SB scleral buckle; PPV pars plana vitrectomy; VH Vitreous hemorrhage; PRP panretinal laser photocoagulation; IVK intravitreal kenalog; VMT vitreomacular traction; MH Macular hole;  NVD neovascularization of the disc; NVE neovascularization elsewhere; AREDS age related eye disease study; ARMD age related macular degeneration; POAG primary open angle glaucoma; EBMD epithelial/anterior basement membrane dystrophy; ACIOL anterior chamber intraocular lens; IOL intraocular lens; PCIOL posterior chamber intraocular lens; Phaco/IOL phacoemulsification  with intraocular lens placement; [place] photorefractive keratectomy; LASIK laser assisted in situ  keratomileusis; HTN hypertension; DM diabetes mellitus; COPD chronic obstructive pulmonary disease

## 2021-06-18 ENCOUNTER — Ambulatory Visit
Admission: RE | Admit: 2021-06-18 | Discharge: 2021-06-18 | Disposition: A | Discharge status: Home / Self Care | Payer: Medicare Other | Source: Ambulatory Visit | Attending: Nurse Practitioner | Admitting: Nurse Practitioner

## 2021-06-18 DIAGNOSIS — R590 Localized enlarged lymph nodes: Secondary | ICD-10-CM

## 2021-06-22 LAB — COLOGUARD: COLOGUARD: NEGATIVE

## 2021-06-24 NOTE — Progress Notes (Addendum)
MEDICARE ANNUAL WELLNESS VISIT AND FOLLOW UP  Assessment:   Encounter for Medicare annual wellness exam 1 year  Diabetic polyneuropathy associated with diabetes mellitus due to underlying condition ([place]) Continue follow up  Type 2 diabetes mellitus with stage 2 chronic kidney disease, without long-term current use of insulin (HCC) Increase fluids, avoid NSAIDS, monitor sugars, will monitor Discussed trulicity to help lower A1c , pt will consider- is not ready to start now  Type 2 diabetes mellitus with hyperlipidemia ([place]) STRONGLY suggest getting on low dose statin, patient declines at this time, will consider.  decrease fatty foods increase activity.   Current moderate episode of depression(HCC) Suggested therapy and possible adjustment of medication, currently on Cymbalta for pain- pt refuses at this time.  Will try to increase exercise Instructed patient to contact office or on-call physician promptly should condition worsen or any new symptoms appear. IF THE PATIENT HAS ANY SUICIDAL OR HOMICIDAL IDEATIONS, CALL THE OFFICE, DISCUSS WITH A SUPPORT MEMBER, OR GO TO THE ER IMMEDIATELY. Patient was agreeable with this plan.   Diabetes mellitus type 2 with peripheral artery disease ([place]) - suggest repeat of ABI, patient declines at this time.   Diabetes mellitus with macular edema ([place]) - continue follow up every 6 weeks.   Medication management Continued  Mixed hyperlipidemia -     Continue diet and exercise  Essential hypertension - continue medications, DASH diet, exercise and monitor at home. Call if greater than 130/80. CBC  Foot drop, bilateral Has leg braces and follows with ortho  Vitamin D deficiency Continue Vit D supplementation  Skin lesion of hand Keep appt with Skin surgery center for evaluation and biopsy to rule out Squamous or basal cell CA  Left cervical lymphadenopathy -     US SOFT TISSUE HEAD & NECK (NON-THYROID)scheduled Will treat pending  results, may be benign lymph node   Fatty liver Check labs, avoid tylenol, alcohol, weight loss advised.   Elevated Serum Potassium CMP Advised to avoid no salt products   Over 30 minutes of exam, counseling, chart review, and critical decision making was performed  Future Appointments  Date Time Provider [place]  07/03/2021 10:45 AM [facility] [place] [facility]  07/22/2021  3:30 PM Rankin, Clent Demark, MD [facility] None  08/06/2021  3:45 PM Rankin, Clent Demark, MD [facility] None  09/11/2021 10:30 AM Magda Bernheim, NP [facility] None  01/22/2022 11:15 AM Suzzanne Cloud, NP [facility] None  06/11/2022 10:00 AM Magda Bernheim, NP [facility] None  06/26/2022 11:00 AM Magda Bernheim, NP [facility] None    Plan:   During the course of the visit the patient was educated and counseled about appropriate screening and preventive services including:   Pneumococcal vaccine  Influenza vaccine Td vaccine Prevnar 13 Screening electrocardiogram Screening mammography Bone densitometry screening Colorectal cancer screening Diabetes screening Glaucoma screening Nutrition counseling  Advanced directives: given info/requested copies   Subjective:   Grace [name] is a 70 y.o. female who presents for Medicare Annual Wellness Visit and 3 month follow up on hypertension, diabetes, hyperlipidemia, vitamin D def.   U/S of cervical lymph node in 1-2 weeks is scheduled She is interested in COVID vaccine when it becomes available.   BMI is Body mass index is 29 kg/m., she is working on diet and exercise. Wt Readings from Last 3 Encounters:  06/26/21 171 lb 9.6 oz (77.8 kg)  06/10/21 170 lb 3.2 oz (77.2 kg)  01/22/21 174 lb (78.9 kg)  Her blood pressure is not checked at home, today their BP is BP: (!) 144/70 She does not workout. She denies chest pain, shortness of breath, dizziness.   She does have a history of diabetes managed with medication, diet, and exercise.  With PAD had ABI in  2015 that showed mild disease, not on ASA due to Mac Degen She has CKD She has neuropathy which is extensive and requires her to use a walker to avoid falls, she is on cymbalta for neuropathy. She has braces on her legs to help prevent falls, no falls in past year but she is high risk.  With hyperlipidemia not at goal- long discussion about statins, will check today and see where we are at She has macular degeneration and edema following with Dr. [name], not on HCTZ due to this She has not checked her blood sugars Has accucheck she is on 4 metformin a day   1 amaryl a day with largest meal no low blood sugar.    Lab Results  Component Value Date   HGBA1C 7.5 (H) 06/10/2021   Last GFR Lab Results  Component Value Date   GFRNONAA 59 (L) 12/05/2020   Lab Results  Component Value Date   CHOL 172 06/10/2021   HDL 57 06/10/2021   LDLCALC 84 06/10/2021   TRIG 221 (H) 06/10/2021   CHOLHDL 3.0 06/10/2021    Patient is on Vitamin D supplement. Lab Results  Component Value Date   VD25OH 68 06/10/2021      Medication Review  Current Outpatient Medications (Endocrine & Metabolic):    glimepiride (AMARYL) 2 MG tablet, Take  1 tablet  2 x /day  with Meals  for Diabetes   metFORMIN (GLUCOPHAGE-XR) 500 MG 24 hr tablet, TAKE 2 TABLETS TWICE DAILY FOR DIABETES      Current Outpatient Medications (Other):    Ascorbic Acid (VITAMIN C PO), Take 1 tablet by mouth daily.   Calcium 200 MG TABS, Take by mouth. Unsure the mg   Cholecalciferol (VITAMIN D PO), Take 5,000 Units by mouth daily.   DULoxetine (CYMBALTA) 30 MG capsule, Take 1 capsule (30 mg total) by mouth daily.   glucose blood (ACCU-CHEK GUIDE) test strip, Check blood sugar 3 times a day-DX-E11.69 AND E78.5.   Magnesium 250 MG TABS, Take 2 tablets by mouth daily.    Omega-3 Fatty Acids (OMEGA-3 FISH OIL PO), Take 1 capsule by mouth daily.   zinc gluconate 50 MG tablet, Take 50 mg by mouth daily.  Allergies: Allergies   Allergen Reactions   Gabapentin Swelling    Swelling in legs and feet    Current Problems (verified) has Diabetic polyneuropathy ([place]); Foot drop, bilateral; T2_NIDDM; Mixed hyperlipidemia; Vitamin D deficiency; Medication management; Essential hypertension; Fatty liver; Cholelithiases; Hyperlipidemia associated with type 2 diabetes mellitus ([place]); Diabetes mellitus type 2 with peripheral artery disease ([place]); Diabetic macular edema of right eye with proliferative retinopathy associated with type 2 diabetes mellitus ([place]); Proliferative diabetic retinopathy of left eye with macular edema associated with type 2 diabetes mellitus ([place]); Right epiretinal membrane; Non-restorative sleep; Snoring; RLS (restless legs syndrome); OSA on CPAP; CKD stage 3 due to type 2 diabetes mellitus ([place]); and Osteoporosis on their problem list.  Screening Tests Immunization History  Administered Date(s) Administered   Influenza, High Dose Seasonal PF 05/12/2016, 07/03/2017, 05/24/2018, 06/10/2019, 05/02/2021   Influenza-Unspecified 07/03/2017   Pneumococcal Conjugate-13 02/07/2016   Pneumococcal Polysaccharide-23 09/03/2017   Td 02/07/2016    Preventative care: Cologuard 06/14/21 negative Last mammogram:  08/24/20 Last pap smear/pelvic exam: s/p TAH   DEXA: Ordered with MGM CXr 2012  Prior vaccinations: TD or Tdap: 2017 Influenza: 2015  Pneumococcal: 2019 Prevnar13: 2017 Shingles/Zostavax: 2012  Names of Other Physician/Practitioners you currently use: 1. [facility]- here for primary care 2. Dr. Nicki Reaper Minor, eye doctor, last visit 05/2021 Dr. Zenaida Niece, dentist, last visit 12/2020 Patient Care Team: Grace Pinto, MD as PCP - General (Internal Medicine) [name] Clent Demark, MD as Consulting Physician (Ophthalmology) Kathrynn Ducking, MD as Consulting Physician (Neurology) Monna Fam, MD as Consulting Physician (Ophthalmology) Earnstine Regal, PA-C as Physician  Assistant (Obstetrics and Gynecology)  Surgical: She  has a past surgical history that includes Tonsillectomy; Abdominal hysterectomy; Hip surgery; Ankle fracture surgery; Cataract extraction, bilateral; Cataract extraction w/PHACO (Right, 02/21/2014); and Cataract extraction w/PHACO (Left, 04/10/2014). Family Her family history includes Diabetes in her brother and mother. Social history  She reports that she has never smoked. She has never used smokeless tobacco. She reports that she does not drink alcohol and does not use drugs.  MEDICARE WELLNESS OBJECTIVES: Physical activity: Current Exercise Habits: The patient does not participate in regular exercise at present, Exercise limited by: orthopedic condition(s);neurologic condition(s) Cardiac risk factors: Cardiac Risk Factors include: advanced age (>68mn, >>57women);diabetes mellitus;dyslipidemia;hypertension;obesity (BMI >30kg/m2);sedentary lifestyle Depression/mood screen:   [facility] 06/26/2021  Decreased Interest 2  Down, Depressed, Hopeless 2  PHQ - 2 Score 4  Altered sleeping 0  Tired, decreased energy 3  Change in appetite 0  Feeling bad or failure about yourself  3  Trouble concentrating 0  Moving slowly or fidgety/restless 3  Suicidal thoughts 0  PHQ-9 Score 13  Difficult doing work/chores Somewhat difficult    ADLs:  In your present state of health, do you have any difficulty performing the following activities: 06/26/2021 12/05/2020  Hearing? N N  Vision? N -  Difficulty concentrating or making decisions? N N  Walking or climbing stairs? N Y  Comment - sebere daibetic neuropathy with bilay foot drop  Dressing or bathing? N N  Doing errands, shopping? YTempie Donning Comment husband drives the patient husband transports  PConservation officer, natureand eating ? N -  Using the Toilet? N -  In the past six months, have you accidently leaked urine? N -  Do you have problems with loss of bowel control? N -  Managing your  Medications? N -  Managing your Finances? N -  Housekeeping or managing your Housekeeping? N -  Some recent data might be hidden     Cognitive Testing  Alert? Yes  Normal Appearance?Yes  Oriented to person? Yes  Place? Yes   Time? Yes  Recall of three objects?  Yes  Can perform simple calculations? Yes  Displays appropriate judgment?Yes  Can read the correct time from a watch face?Yes  EOL planning: Does Patient Have a Medical Advance Directive?: No Would patient like information on creating a medical advance directive?: Yes (MAU/Ambulatory/Procedural Areas - Information given)   Objective:   Today's Vitals   06/26/21 1049  BP: (!) 144/70  Pulse: 72  Resp: 16  Temp: 98 F (36.7 C)  SpO2: 98%  Weight: 171 lb 9.6 oz (77.8 kg)  Height: 5' 4.5" (1.638 m)    Body mass index is 29 kg/m.  General Appearance: Well nourished, in no apparent distress.  Eyes: PERRLA, EOMs, conjunctiva no swelling or erythema, normal fundi and vessels.  Sinuses: No Frontal/maxillary tenderness  ENT/Mouth: Ext aud canals  clear, normal light reflex with TMs without erythema, bulging. Good dentition. No erythema, swelling, or exudate on post pharynx. Tonsils not swollen or erythematous. Hearing normal.  Neck: Supple, thyroid normal. No bruits Swelling left neck, possible lymph node Respiratory: Respiratory effort normal, BS equal bilaterally without rales, rhonchi, wheezing or stridor.  Cardio: RRR without murmurs, rubs or gallops. DP and PT slighlty diminished Chest: symmetric, with normal excursions and percussion.  Breasts: Defer to GYN Abdomen: Positive bowel sounds all 4 quadrants, Soft, nontender, no guarding, rebound, hernias, masses, or organomegaly.  Lymphatics: Possible enlarged lymph node left cervical chain, nontender Genitourinary:Defer Musculoskeletal: Full ROM all peripheral extremities,5/5 strength, and normal gait.  Skin: Feet and purple discoloration and cool to touch, Left 3rd toe  has skin tear on dorsal side, slight purulent exudate noted. Right 3rd finger has a red,swollen area which is raised and has fluid , 1cm and raised approx 5 cm. Neuro: Cranial nerves intact, reflexes equal bilaterally. Normal muscle tone, no cerebellar symptoms. Sensation intact with monofilament, PT and DP pulses 2+ Psych: Awake and oriented X 3, normal affect, Insight and Judgment appropriate.    Medicare Attestation I have personally reviewed: The patient's medical and social history Their use of alcohol, tobacco or illicit drugs Their current medications and supplements The patient's functional ability including ADLs,fall risks, home safety risks, cognitive, and hearing and visual impairment Diet and physical activities Evidence for depression or mood disorders  The patient's weight, height, BMI, and visual acuity have been recorded in the chart.  I have made referrals, counseling, and provided education to the patient based on review of the above and I have provided the patient with a written personalized care plan for preventive services.     Magda Bernheim, NP   06/26/2021

## 2021-06-26 ENCOUNTER — Encounter: Payer: Self-pay | Admitting: Nurse Practitioner

## 2021-06-26 ENCOUNTER — Other Ambulatory Visit: Payer: Self-pay

## 2021-06-26 ENCOUNTER — Telehealth: Payer: Self-pay

## 2021-06-26 ENCOUNTER — Ambulatory Visit (INDEPENDENT_AMBULATORY_CARE_PROVIDER_SITE_OTHER): Payer: Medicare Other | Admitting: Nurse Practitioner

## 2021-06-26 VITALS — BP 144/70 | HR 72 | Temp 98.0°F | Resp 16 | Ht 64.5 in | Wt 171.6 lb

## 2021-06-26 DIAGNOSIS — Z0001 Encounter for general adult medical examination with abnormal findings: Secondary | ICD-10-CM | POA: Diagnosis not present

## 2021-06-26 DIAGNOSIS — Z79899 Other long term (current) drug therapy: Secondary | ICD-10-CM | POA: Diagnosis not present

## 2021-06-26 DIAGNOSIS — R6889 Other general symptoms and signs: Secondary | ICD-10-CM | POA: Diagnosis not present

## 2021-06-26 DIAGNOSIS — M21371 Foot drop, right foot: Secondary | ICD-10-CM

## 2021-06-26 DIAGNOSIS — E785 Hyperlipidemia, unspecified: Secondary | ICD-10-CM

## 2021-06-26 DIAGNOSIS — I10 Essential (primary) hypertension: Secondary | ICD-10-CM | POA: Diagnosis not present

## 2021-06-26 DIAGNOSIS — E08311 Diabetes mellitus due to underlying condition with unspecified diabetic retinopathy with macular edema: Secondary | ICD-10-CM

## 2021-06-26 DIAGNOSIS — E559 Vitamin D deficiency, unspecified: Secondary | ICD-10-CM

## 2021-06-26 DIAGNOSIS — E875 Hyperkalemia: Secondary | ICD-10-CM | POA: Diagnosis not present

## 2021-06-26 DIAGNOSIS — E1122 Type 2 diabetes mellitus with diabetic chronic kidney disease: Secondary | ICD-10-CM | POA: Diagnosis not present

## 2021-06-26 DIAGNOSIS — E1151 Type 2 diabetes mellitus with diabetic peripheral angiopathy without gangrene: Secondary | ICD-10-CM | POA: Diagnosis not present

## 2021-06-26 DIAGNOSIS — M21372 Foot drop, left foot: Secondary | ICD-10-CM

## 2021-06-26 DIAGNOSIS — E11311 Type 2 diabetes mellitus with unspecified diabetic retinopathy with macular edema: Secondary | ICD-10-CM | POA: Diagnosis not present

## 2021-06-26 DIAGNOSIS — K760 Fatty (change of) liver, not elsewhere classified: Secondary | ICD-10-CM | POA: Diagnosis not present

## 2021-06-26 DIAGNOSIS — F321 Major depressive disorder, single episode, moderate: Secondary | ICD-10-CM | POA: Diagnosis not present

## 2021-06-26 DIAGNOSIS — L989 Disorder of the skin and subcutaneous tissue, unspecified: Secondary | ICD-10-CM

## 2021-06-26 DIAGNOSIS — E0842 Diabetes mellitus due to underlying condition with diabetic polyneuropathy: Secondary | ICD-10-CM

## 2021-06-26 DIAGNOSIS — E1169 Type 2 diabetes mellitus with other specified complication: Secondary | ICD-10-CM | POA: Diagnosis not present

## 2021-06-26 DIAGNOSIS — N182 Chronic kidney disease, stage 2 (mild): Secondary | ICD-10-CM | POA: Diagnosis not present

## 2021-06-26 DIAGNOSIS — Z0000 Encounter for general adult medical examination without abnormal findings: Secondary | ICD-10-CM

## 2021-06-26 NOTE — Patient Instructions (Signed)

## 2021-06-26 NOTE — Telephone Encounter (Signed)
Patient wanted to let you know that her optometrist is Scott Minor.

## 2021-06-27 LAB — BASIC METABOLIC PANEL WITH GFR
BUN: 24 mg/dL (ref 7–25)
CO2: 27 mmol/L (ref 20–32)
Calcium: 10.2 mg/dL (ref 8.6–10.4)
Chloride: 104 mmol/L (ref 98–110)
Creat: 0.87 mg/dL (ref 0.60–1.00)
Glucose, Bld: 128 mg/dL — ABNORMAL HIGH (ref 65–99)
Potassium: 5.8 mmol/L — ABNORMAL HIGH (ref 3.5–5.3)
Sodium: 146 mmol/L (ref 135–146)
eGFR: 71 mL/min/{1.73_m2} (ref >=60)

## 2021-06-27 LAB — CBC WITH DIFFERENTIAL/PLATELET
Absolute Monocytes: 569 cells/uL (ref 200–950)
Basophils Absolute: 62 cells/uL (ref 0–200)
Basophils Relative: 0.8 %
Eosinophils Absolute: 257 cells/uL (ref 15–500)
Eosinophils Relative: 3.3 %
HCT: 38.1 % (ref 35.0–45.0)
Hemoglobin: 12.6 g/dL (ref 11.7–15.5)
Lymphs Abs: 2036 cells/uL (ref 850–3900)
MCH: 32 pg (ref 27.0–33.0)
MCHC: 33.1 g/dL (ref 32.0–36.0)
MCV: 96.7 fL (ref 80.0–100.0)
MPV: 9.4 fL (ref 7.5–12.5)
Monocytes Relative: 7.3 %
Neutro Abs: 4875 cells/uL (ref 1500–7800)
Neutrophils Relative %: 62.5 %
Platelets: 378 10*3/uL (ref 140–400)
RBC: 3.94 10*6/uL (ref 3.80–5.10)
RDW: 12.6 % (ref 11.0–15.0)
Total Lymphocyte: 26.1 %
WBC: 7.8 10*3/uL (ref 3.8–10.8)

## 2021-07-01 NOTE — Progress Notes (Signed)
Patient is aware of instructions and follow up has been scheduled but patient is unable to come until after Thanksgiving due to other appointments that have already been set up. -e welch

## 2021-07-03 ENCOUNTER — Ambulatory Visit
Admission: RE | Admit: 2021-07-03 | Discharge: 2021-07-03 | Disposition: A | Discharge status: Home / Self Care | Payer: Medicare Other | Source: Ambulatory Visit | Attending: Nurse Practitioner | Admitting: Nurse Practitioner

## 2021-07-03 DIAGNOSIS — R590 Localized enlarged lymph nodes: Secondary | ICD-10-CM | POA: Diagnosis not present

## 2021-07-03 DIAGNOSIS — L723 Sebaceous cyst: Secondary | ICD-10-CM | POA: Diagnosis not present

## 2021-07-03 DIAGNOSIS — M71341 Other bursal cyst, right hand: Secondary | ICD-10-CM | POA: Diagnosis not present

## 2021-07-04 ENCOUNTER — Other Ambulatory Visit: Payer: Self-pay | Admitting: Nurse Practitioner

## 2021-07-04 DIAGNOSIS — R221 Localized swelling, mass and lump, neck: Secondary | ICD-10-CM

## 2021-07-17 ENCOUNTER — Ambulatory Visit: Payer: Medicare Other | Admitting: Nurse Practitioner

## 2021-07-22 ENCOUNTER — Encounter (INDEPENDENT_AMBULATORY_CARE_PROVIDER_SITE_OTHER): Payer: Medicare Other | Admitting: Ophthalmology

## 2021-07-23 ENCOUNTER — Other Ambulatory Visit: Payer: Self-pay

## 2021-07-23 ENCOUNTER — Ambulatory Visit (INDEPENDENT_AMBULATORY_CARE_PROVIDER_SITE_OTHER): Payer: Medicare Other | Admitting: Ophthalmology

## 2021-07-23 ENCOUNTER — Encounter (INDEPENDENT_AMBULATORY_CARE_PROVIDER_SITE_OTHER): Payer: Self-pay | Admitting: Ophthalmology

## 2021-07-23 DIAGNOSIS — E113512 Type 2 diabetes mellitus with proliferative diabetic retinopathy with macular edema, left eye: Secondary | ICD-10-CM | POA: Diagnosis not present

## 2021-07-23 DIAGNOSIS — E113511 Type 2 diabetes mellitus with proliferative diabetic retinopathy with macular edema, right eye: Secondary | ICD-10-CM

## 2021-07-23 DIAGNOSIS — H35371 Puckering of macula, right eye: Secondary | ICD-10-CM | POA: Diagnosis not present

## 2021-07-23 MED ORDER — AFLIBERCEPT 2MG/0.05ML IZ SOLN FOR KALEIDOSCOPE
2.0000 mg | INTRAVITREAL | Status: AC | PRN
Start: 2021-07-23 — End: 2021-07-23
  Administered 2021-07-23: 2 mg via INTRAVITREAL

## 2021-07-23 NOTE — Assessment & Plan Note (Signed)
Minor none foveal involved epiretinal membrane OD observe

## 2021-07-23 NOTE — Progress Notes (Signed)
07/23/2021     CHIEF COMPLAINT Patient presents for  Chief Complaint  Patient presents with   Retina Follow Up      HISTORY OF PRESENT ILLNESS: Grace [name] is a 70 y.o. female who presents to the clinic today for:   HPI     Retina Follow Up           Diagnosis: Diabetic Retinopathy   Laterality: right eye   Onset: 5 weeks ago   Severity: mild   Duration: 5 weeks   Course: stable         Comments   5 weeks fu OD oct Eylea OD. Pt states "about a week ago my vision has started getting bad, or changing. It is not as good." Denies new FOL or floaters.      Last edited by Laurin Coder on 07/23/2021  3:33 PM.      Referring physician: Unk Pinto, MD [address] [place],  [place] [address]  HISTORICAL INFORMATION:   Selected notes from the MEDICAL RECORD NUMBER    Lab Results  Component Value Date   HGBA1C 7.5 (H) 06/10/2021     CURRENT MEDICATIONS: No current outpatient medications on file. (Ophthalmic Drugs)   No current facility-administered medications for this visit. (Ophthalmic Drugs)   Current Outpatient Medications (Other)  Medication Sig   Ascorbic Acid (VITAMIN C PO) Take 1 tablet by mouth daily.   Calcium 200 MG TABS Take by mouth. Unsure the mg   Cholecalciferol (VITAMIN D PO) Take 5,000 Units by mouth daily.   DULoxetine (CYMBALTA) 30 MG capsule Take 1 capsule (30 mg total) by mouth daily.   glimepiride (AMARYL) 2 MG tablet Take  1 tablet  2 x /day  with Meals  for Diabetes   glucose blood (ACCU-CHEK GUIDE) test strip Check blood sugar 3 times a day-DX-E11.69 AND E78.5.   Magnesium 250 MG TABS Take 2 tablets by mouth daily.    metFORMIN (GLUCOPHAGE-XR) 500 MG 24 hr tablet TAKE 2 TABLETS TWICE DAILY FOR DIABETES   Omega-3 Fatty Acids (OMEGA-3 FISH OIL PO) Take 1 capsule by mouth daily.   zinc gluconate 50 MG tablet Take 50 mg by mouth daily.   No current facility-administered medications for this visit.  (Other)      REVIEW OF SYSTEMS:    ALLERGIES Allergies  Allergen Reactions   Gabapentin Swelling    Swelling in legs and feet    PAST MEDICAL HISTORY Past Medical History:  Diagnosis Date   Abnormality of gait 07/28/2013   Diabetes ([place])    Diabetic peripheral neuropathy (HCC)    Diabetic retinopathy ([place])    Foot drop, bilateral 07/31/2014   Peripheral edema    Polyneuropathy in diabetes(357.2) 07/28/2013   Past Surgical History:  Procedure Laterality Date   ABDOMINAL HYSTERECTOMY     ANKLE FRACTURE SURGERY     left   CATARACT EXTRACTION W/PHACO Right 02/21/2014   Dr. Herbert Deaner   CATARACT EXTRACTION W/PHACO Left 04/10/2014   Dr. Herbert Deaner   CATARACT EXTRACTION, [place]     right   TONSILLECTOMY      FAMILY HISTORY Family History  Problem Relation Age of Onset   Diabetes Mother    Diabetes Brother    Neuropathy Neg Hx     SOCIAL HISTORY Social History   Tobacco Use   Smoking status: Never   Smokeless tobacco: Never  Substance Use Topics   Alcohol use: No  Drug use: No         OPHTHALMIC EXAM:  Base Eye Exam     Visual Acuity (ETDRS)       Right Left   Dist [place] 20/50 -1 20/80 -1   Dist ph [place] 20/30 -2 20/60 -1         Tonometry (Tonopen, 3:38 PM)       Right Left   Pressure 21 14         Pupils       Pupils Dark Light React APD   Right PERRL 3 3 Minimal None   Left PERRL 3 3 Minimal None         Extraocular Movement       Right Left    Full Full         Neuro/Psych     Oriented x3: Yes   Mood/Affect: Normal         Dilation     Right eye: 1.0% Mydriacyl, 2.5% Phenylephrine @ 3:38 PM           Slit Lamp and Fundus Exam     External Exam       Right Left   External Normal Normal         Slit Lamp Exam       Right Left   Lids/Lashes Normal Normal   Conjunctiva/Sclera White and quiet White and quiet   Cornea Clear Clear   Anterior Chamber Deep and quiet Deep and quiet   [place]  Round and reactive Round and reactive   Lens Posterior chamber intraocular lens Posterior chamber intraocular lens   Anterior Vitreous Normal Normal         Fundus Exam       Right Left   Posterior Vitreous Posterior vitreous detachment    Disc Normal    C/D Ratio 0.3    Macula Epiretinal membrane, good focal laser, Macular thickening minor    Vessels PDR-quiet    Periphery good PRP,, ATTACHED             IMAGING AND PROCEDURES  Imaging and Procedures for 07/23/21  Intravitreal Injection, Pharmacologic Agent - OD - Right Eye       Time Out 07/23/2021. 4:13 PM. Confirmed correct patient, procedure, site, and patient consented.   Anesthesia Topical anesthesia was used. Anesthetic medications included Lidocaine 4%.   Procedure Preparation included 5% betadine to ocular surface, 10% betadine to eyelids, Tobramycin 0.3%.   Injection: 2 mg aflibercept 2 MG/0.05ML   Route: Intravitreal, Site: Right Eye   NDC: [id number], Lot: [id number], Waste: 0 mL   Post-op Post injection exam found visual acuity of at least counting fingers. The patient tolerated the procedure well. There were no complications. The patient received written and verbal post procedure care education. Post injection medications were not given.      OCT, Retina - OU - Both Eyes       Right Eye Quality was good. Scan locations included subfoveal. Central Foveal Thickness: 308. Progression has improved. Findings include abnormal foveal contour, epiretinal membrane.   Left Eye Quality was good. Scan locations included subfoveal. Central Foveal Thickness: 321. Progression has improved. Findings include abnormal foveal contour, epiretinal membrane.   Notes OD 5 week postinjection for diabetic CSME.  Epiretinal membrane remains.  Repeat injection intravitreal Eylea today at 5 weeks and follow-up again in 5 to 6 weeks  OS also spontaneous improvement, today at 6 weeks postinjection OS,  follow-up as  scheduled, much  improved overall and sustained improvement              ASSESSMENT/PLAN:  Diabetic macular edema of right eye with proliferative retinopathy associated with type 2 diabetes mellitus ([place])  The nature of diabetic macular edema was discussed with the patient. Treatment options were outlined including medical therapy, laser & vitrectomy. The use of injectable medications reviewed, including Avastin, Lucentis, and Eylea. Periodic injections into the eye are likely to resolve diabetic macular edema (swelling in the center of vision). Initially, injections are delivered are delivered every 4-6 weeks, and the interval extended as the condition improves. On average, 8-9 injections the first year, and 5 in year 2. Improvement in the condition most often improves on medical therapy. Occasional use of focal laser is also recommended for residual macular edema (swelling). Excellent control of blood glucose and blood pressure are encouraged under the care of a primary physician or endocrinologist. Similarly, attempts to maintain serum cholesterol, low density lipoproteins, and high-density lipoproteins in a favorable range were recommended.   OD overall vastly improved yet still with some recurrence of blurred vision per patient report over the last week.  Repeat injection today to maintain.  Proliferative diabetic retinopathy of left eye with macular edema associated with type 2 diabetes mellitus (HCC) OS follow-up as scheduled  Right epiretinal membrane Minor none foveal involved epiretinal membrane OD observe     ICD-10-CM   1. Diabetic macular edema of right eye with proliferative retinopathy associated with type 2 diabetes mellitus (HCC)  E11.3511 Intravitreal Injection, Pharmacologic Agent - OD - Right Eye    OCT, Retina - OU - Both Eyes    aflibercept (EYLEA) SOLN 2 mg    2. Proliferative diabetic retinopathy of left eye with macular edema associated with type 2 diabetes mellitus  ([place])  N56.2130     3. Right epiretinal membrane  H35.371       1.  Patient continues to remain stable with stable acuity OD on chronic suppression antivegF with intravitreal Eylea.  Macular condition remains slightly more responsive as patient is mostly compliant with CPAP use nightly  2.  OS follow-up as scheduled  3.  Epiretinal membrane OU no indication for intervention at this time but no impact on acuity  Ophthalmic Meds Ordered this visit:  Meds ordered this encounter  Medications   aflibercept (EYLEA) SOLN 2 mg       Return in about 5 weeks (around 08/27/2021) for dilate, OD, EYLEA OCT.  There are no Patient Instructions on file for this visit.   Explained the diagnoses, plan, and follow up with the patient and they expressed understanding.  Patient expressed understanding of the importance of proper follow up care.   Clent Demark Rishabh Rinkenberger M.D. Diseases & Surgery of the Retina and Vitreous Retina & Diabetic [place] 07/23/21     Abbreviations: M myopia (nearsighted); A astigmatism; H hyperopia (farsighted); P presbyopia; Mrx spectacle prescription;  CTL contact lenses; OD right eye; OS left eye; OU both eyes  XT exotropia; ET esotropia; PEK punctate epithelial keratitis; PEE punctate epithelial erosions; DES dry eye syndrome; MGD meibomian gland dysfunction; ATs artificial tears; PFAT's preservative free artificial tears; [place] nuclear sclerotic cataract; PSC posterior subcapsular cataract; ERM epi-retinal membrane; PVD posterior vitreous detachment; RD retinal detachment; DM diabetes mellitus; DR diabetic retinopathy; NPDR non-proliferative diabetic retinopathy; PDR proliferative diabetic retinopathy; CSME clinically significant macular edema; DME diabetic macular edema; dbh dot blot hemorrhages; CWS cotton wool spot; POAG primary open angle glaucoma; C/D cup-to-disc  ratio; HVF humphrey visual field; GVF goldmann visual field; OCT optical coherence tomography; IOP intraocular  pressure; BRVO Branch retinal vein occlusion; CRVO central retinal vein occlusion; CRAO central retinal artery occlusion; BRAO branch retinal artery occlusion; RT retinal tear; SB scleral buckle; PPV pars plana vitrectomy; VH Vitreous hemorrhage; PRP panretinal laser photocoagulation; IVK intravitreal kenalog; VMT vitreomacular traction; MH Macular hole;  NVD neovascularization of the disc; NVE neovascularization elsewhere; AREDS age related eye disease study; ARMD age related macular degeneration; POAG primary open angle glaucoma; EBMD epithelial/anterior basement membrane dystrophy; ACIOL anterior chamber intraocular lens; IOL intraocular lens; PCIOL posterior chamber intraocular lens; Phaco/IOL phacoemulsification with intraocular lens placement; [place] photorefractive keratectomy; LASIK laser assisted in situ keratomileusis; HTN hypertension; DM diabetes mellitus; COPD chronic obstructive pulmonary disease

## 2021-07-23 NOTE — Assessment & Plan Note (Signed)
The nature of diabetic macular edema was discussed with the patient. Treatment options were outlined including medical therapy, laser & vitrectomy. The use of injectable medications reviewed, including Avastin, Lucentis, and Eylea. Periodic injections into the eye are likely to resolve diabetic macular edema (swelling in the center of vision). Initially, injections are delivered are delivered every 4-6 weeks, and the interval extended as the condition improves. On average, 8-9 injections the first year, and 5 in year 2. Improvement in the condition most often improves on medical therapy. Occasional use of focal laser is also recommended for residual macular edema (swelling). Excellent control of blood glucose and blood pressure are encouraged under the care of a primary physician or endocrinologist. Similarly, attempts to maintain serum cholesterol, low density lipoproteins, and high-density lipoproteins in a favorable range were recommended.   OD overall vastly improved yet still with some recurrence of blurred vision per patient report over the last week.  Repeat injection today to maintain.

## 2021-07-23 NOTE — Assessment & Plan Note (Signed)
OS follow-up as scheduled 

## 2021-07-26 ENCOUNTER — Ambulatory Visit
Admission: RE | Admit: 2021-07-26 | Discharge: 2021-07-26 | Disposition: A | Discharge status: Home / Self Care | Payer: Medicare Other | Source: Ambulatory Visit | Attending: Nurse Practitioner | Admitting: Nurse Practitioner

## 2021-07-26 DIAGNOSIS — R221 Localized swelling, mass and lump, neck: Secondary | ICD-10-CM | POA: Diagnosis not present

## 2021-07-26 MED ORDER — IOPAMIDOL (ISOVUE-300) INJECTION 61%: 75.0000 mL | Freq: Once | INTRAVENOUS | Status: DC | PRN

## 2021-07-26 MED ORDER — IOPAMIDOL (ISOVUE-300) INJECTION 61%
75.0000 mL | Freq: Once | INTRAVENOUS | Status: AC | PRN
  Administered 2021-07-26: 75 mL via INTRAVENOUS

## 2021-07-29 ENCOUNTER — Other Ambulatory Visit: Payer: Self-pay | Admitting: Nurse Practitioner

## 2021-07-29 ENCOUNTER — Ambulatory Visit (HOSPITAL_BASED_OUTPATIENT_CLINIC_OR_DEPARTMENT_OTHER)
Admission: RE | Admit: 2021-07-29 | Discharge: 2021-07-29 | Disposition: A | Discharge status: Home / Self Care | Payer: Medicare Other | Source: Ambulatory Visit | Attending: Nurse Practitioner | Admitting: Nurse Practitioner

## 2021-07-29 ENCOUNTER — Other Ambulatory Visit: Payer: Self-pay

## 2021-07-29 DIAGNOSIS — K148 Other diseases of tongue: Secondary | ICD-10-CM

## 2021-07-29 DIAGNOSIS — E042 Nontoxic multinodular goiter: Secondary | ICD-10-CM | POA: Diagnosis not present

## 2021-07-29 DIAGNOSIS — E041 Nontoxic single thyroid nodule: Secondary | ICD-10-CM | POA: Insufficient documentation

## 2021-07-29 NOTE — Progress Notes (Signed)
Notified patient of Thyroid US result. Patient understands and will keep ENT appointment 07/30/21 w/ Dr. Leta Baptist.  Faxed US Thyroid result and Tongue Biopsy ordero Dr. Benjamine Mola , asked Dr. Benjamine Mola to advise if he prefers to order & manage biopsy.

## 2021-07-29 NOTE — Progress Notes (Signed)
U/S of thyroid was ok, just want to repeat in 1 year to make sure it is stable.  Keep appointment with ENT

## 2021-07-30 ENCOUNTER — Other Ambulatory Visit: Payer: Self-pay

## 2021-07-30 ENCOUNTER — Encounter (HOSPITAL_COMMUNITY): Payer: Self-pay | Admitting: Radiology

## 2021-07-30 ENCOUNTER — Other Ambulatory Visit: Payer: Self-pay | Admitting: Otolaryngology

## 2021-07-30 ENCOUNTER — Encounter (HOSPITAL_BASED_OUTPATIENT_CLINIC_OR_DEPARTMENT_OTHER): Payer: Self-pay | Admitting: Otolaryngology

## 2021-07-30 DIAGNOSIS — D487 Neoplasm of uncertain behavior of other specified sites: Secondary | ICD-10-CM | POA: Diagnosis not present

## 2021-07-30 DIAGNOSIS — D3705 Neoplasm of uncertain behavior of pharynx: Secondary | ICD-10-CM | POA: Diagnosis not present

## 2021-07-30 DIAGNOSIS — R590 Localized enlarged lymph nodes: Secondary | ICD-10-CM | POA: Diagnosis not present

## 2021-07-30 NOTE — Progress Notes (Signed)
Patient Name  Grace [name], Grace [name] Legal Sex  Female DOB  Oct 19, 1950 SSN  [id number] Address  [place]  [place] [address] Phone  [phone] [facility])  [phone] (Mobile) *Preferred*    RE: [place] FNA SOFT TISSUE Received: Today Arne Cleveland, MD  [place], Yannick Steuber D Ok   [place] core L neck mass/LAN   DDH        Previous Messages   ----- Message -----  From: Garth Bigness D  Sent: 07/29/2021   4:43 PM EST  To: [facility] Procedure Requests  Subject: [place] FNA SOFT TISSUE                             Procedure:  [place] FNA SOFT TISSUE   Reason:  Lesion of tongue   History:  [place], CT in computer   Provider:  Magda Bernheim   Provider Contact:  [phone]

## 2021-08-01 ENCOUNTER — Encounter (HOSPITAL_BASED_OUTPATIENT_CLINIC_OR_DEPARTMENT_OTHER)
Admission: RE | Admit: 2021-08-01 | Discharge: 2021-08-01 | Disposition: A | Discharge status: Home / Self Care | Payer: Medicare Other | Source: Ambulatory Visit | Attending: Otolaryngology | Admitting: Otolaryngology

## 2021-08-01 DIAGNOSIS — R221 Localized swelling, mass and lump, neck: Secondary | ICD-10-CM | POA: Diagnosis not present

## 2021-08-01 DIAGNOSIS — K149 Disease of tongue, unspecified: Secondary | ICD-10-CM | POA: Diagnosis not present

## 2021-08-01 DIAGNOSIS — G4730 Sleep apnea, unspecified: Secondary | ICD-10-CM | POA: Diagnosis not present

## 2021-08-01 DIAGNOSIS — G709 Myoneural disorder, unspecified: Secondary | ICD-10-CM | POA: Diagnosis not present

## 2021-08-01 DIAGNOSIS — N289 Disorder of kidney and ureter, unspecified: Secondary | ICD-10-CM | POA: Diagnosis not present

## 2021-08-01 DIAGNOSIS — Z7984 Long term (current) use of oral hypoglycemic drugs: Secondary | ICD-10-CM | POA: Diagnosis not present

## 2021-08-01 DIAGNOSIS — E1151 Type 2 diabetes mellitus with diabetic peripheral angiopathy without gangrene: Secondary | ICD-10-CM | POA: Diagnosis not present

## 2021-08-01 DIAGNOSIS — I10 Essential (primary) hypertension: Secondary | ICD-10-CM | POA: Diagnosis not present

## 2021-08-01 LAB — BASIC METABOLIC PANEL
Anion gap: 8 (ref 5–15)
BUN: 22 mg/dL (ref 8–23)
CO2: 29 mmol/L (ref 22–32)
Calcium: 9.6 mg/dL (ref 8.9–10.3)
Chloride: 99 mmol/L (ref 98–111)
Creatinine, Ser: 1.01 mg/dL — ABNORMAL HIGH (ref 0.44–1.00)
GFR, Estimated: 60 mL/min — ABNORMAL LOW (ref >60)
Glucose, Bld: 294 mg/dL — ABNORMAL HIGH (ref 70–99)
Potassium: 5.4 mmol/L — ABNORMAL HIGH (ref 3.5–5.1)
Sodium: 136 mmol/L (ref 135–145)

## 2021-08-01 NOTE — Progress Notes (Signed)
Sent text reminding to come today for lab work before surgery on 08/02/2021.

## 2021-08-02 ENCOUNTER — Other Ambulatory Visit: Payer: Self-pay

## 2021-08-02 ENCOUNTER — Ambulatory Visit (HOSPITAL_BASED_OUTPATIENT_CLINIC_OR_DEPARTMENT_OTHER): Payer: Medicare Other | Admitting: Anesthesiology

## 2021-08-02 ENCOUNTER — Encounter (HOSPITAL_BASED_OUTPATIENT_CLINIC_OR_DEPARTMENT_OTHER)
Admission: RE | Disposition: A | Discharge status: Home / Self Care | Payer: Self-pay | Source: Home / Self Care | Attending: Otolaryngology

## 2021-08-02 ENCOUNTER — Ambulatory Visit (HOSPITAL_BASED_OUTPATIENT_CLINIC_OR_DEPARTMENT_OTHER)
Admission: RE | Admit: 2021-08-02 | Discharge: 2021-08-02 | Disposition: A | Discharge status: Home / Self Care | Payer: Medicare Other | Attending: Otolaryngology | Admitting: Otolaryngology

## 2021-08-02 ENCOUNTER — Encounter (HOSPITAL_BASED_OUTPATIENT_CLINIC_OR_DEPARTMENT_OTHER): Payer: Self-pay | Admitting: Otolaryngology

## 2021-08-02 DIAGNOSIS — K149 Disease of tongue, unspecified: Secondary | ICD-10-CM | POA: Insufficient documentation

## 2021-08-02 DIAGNOSIS — K148 Other diseases of tongue: Secondary | ICD-10-CM | POA: Diagnosis not present

## 2021-08-02 DIAGNOSIS — N289 Disorder of kidney and ureter, unspecified: Secondary | ICD-10-CM | POA: Diagnosis not present

## 2021-08-02 DIAGNOSIS — Z7984 Long term (current) use of oral hypoglycemic drugs: Secondary | ICD-10-CM | POA: Diagnosis not present

## 2021-08-02 DIAGNOSIS — K135 Oral submucous fibrosis: Secondary | ICD-10-CM | POA: Diagnosis not present

## 2021-08-02 DIAGNOSIS — D3705 Neoplasm of uncertain behavior of pharynx: Secondary | ICD-10-CM | POA: Diagnosis not present

## 2021-08-02 DIAGNOSIS — G4730 Sleep apnea, unspecified: Secondary | ICD-10-CM | POA: Diagnosis not present

## 2021-08-02 DIAGNOSIS — G709 Myoneural disorder, unspecified: Secondary | ICD-10-CM | POA: Insufficient documentation

## 2021-08-02 DIAGNOSIS — E1151 Type 2 diabetes mellitus with diabetic peripheral angiopathy without gangrene: Secondary | ICD-10-CM | POA: Diagnosis not present

## 2021-08-02 DIAGNOSIS — K140 Glossitis: Secondary | ICD-10-CM | POA: Diagnosis not present

## 2021-08-02 DIAGNOSIS — I10 Essential (primary) hypertension: Secondary | ICD-10-CM | POA: Diagnosis not present

## 2021-08-02 DIAGNOSIS — R221 Localized swelling, mass and lump, neck: Secondary | ICD-10-CM | POA: Insufficient documentation

## 2021-08-02 HISTORY — DX: Chronic kidney disease, unspecified: N18.9

## 2021-08-02 HISTORY — DX: Sleep apnea, unspecified: G47.30

## 2021-08-02 HISTORY — PX: DIRECT LARYNGOSCOPY: SHX5326

## 2021-08-02 HISTORY — DX: Other diseases of tongue: K14.8

## 2021-08-02 LAB — BASIC METABOLIC PANEL
Anion gap: 9 (ref 5–15)
BUN: 19 mg/dL (ref 8–23)
CO2: 28 mmol/L (ref 22–32)
Calcium: 9.6 mg/dL (ref 8.9–10.3)
Chloride: 102 mmol/L (ref 98–111)
Creatinine, Ser: 1.07 mg/dL — ABNORMAL HIGH (ref 0.44–1.00)
GFR, Estimated: 56 mL/min — ABNORMAL LOW (ref >60)
Glucose, Bld: 125 mg/dL — ABNORMAL HIGH (ref 70–99)
Potassium: 4.6 mmol/L (ref 3.5–5.1)
Sodium: 139 mmol/L (ref 135–145)

## 2021-08-02 LAB — GLUCOSE, CAPILLARY
Glucose-Capillary: 124 mg/dL — ABNORMAL HIGH (ref 70–99)
Glucose-Capillary: 103 mg/dL — ABNORMAL HIGH (ref 70–99)

## 2021-08-02 SURGERY — LARYNGOSCOPY, DIRECT
Anesthesia: General | Site: Mouth | Laterality: Left

## 2021-08-02 MED ORDER — SUGAMMADEX SODIUM 500 MG/5ML IV SOLN
INTRAVENOUS | Status: DC | PRN
  Administered 2021-08-02: 500 mg via INTRAVENOUS

## 2021-08-02 MED ORDER — EPINEPHRINE PF 1 MG/ML IJ SOLN
INTRAMUSCULAR | Status: DC | PRN
  Administered 2021-08-02: .25 mg

## 2021-08-02 MED ORDER — CEFAZOLIN SODIUM-DEXTROSE 2-3 GM-%(50ML) IV SOLR
INTRAVENOUS | Status: DC | PRN
  Administered 2021-08-02: 2 g via INTRAVENOUS

## 2021-08-02 MED ORDER — LACTATED RINGERS IV SOLN: INTRAVENOUS | Status: DC

## 2021-08-02 MED ORDER — FENTANYL CITRATE (PF) 100 MCG/2ML IJ SOLN
INTRAMUSCULAR | Status: DC | PRN
  Administered 2021-08-02 (×2): 50 ug via INTRAVENOUS

## 2021-08-02 MED ORDER — MIDAZOLAM HCL 2 MG/2ML IJ SOLN
INTRAMUSCULAR | Status: AC
  Filled 2021-08-02: qty 2

## 2021-08-02 MED ORDER — PROPOFOL 10 MG/ML IV BOLUS
INTRAVENOUS | Status: DC | PRN
  Administered 2021-08-02: 180 mg via INTRAVENOUS

## 2021-08-02 MED ORDER — ONDANSETRON HCL 4 MG/2ML IJ SOLN
INTRAMUSCULAR | Status: DC | PRN
  Administered 2021-08-02: 4 mg via INTRAVENOUS

## 2021-08-02 MED ORDER — DEXAMETHASONE SODIUM PHOSPHATE 10 MG/ML IJ SOLN
INTRAMUSCULAR | Status: AC
  Filled 2021-08-02: qty 1

## 2021-08-02 MED ORDER — ONDANSETRON HCL 4 MG/2ML IJ SOLN
INTRAMUSCULAR | Status: AC
  Filled 2021-08-02: qty 2

## 2021-08-02 MED ORDER — ACETAMINOPHEN 500 MG PO TABS
1000.0000 mg | ORAL_TABLET | Freq: Once | ORAL | Status: AC
  Administered 2021-08-02: 1000 mg via ORAL

## 2021-08-02 MED ORDER — LIDOCAINE 2% (20 MG/ML) 5 ML SYRINGE
INTRAMUSCULAR | Status: AC
  Filled 2021-08-02: qty 5

## 2021-08-02 MED ORDER — ROCURONIUM BROMIDE 100 MG/10ML IV SOLN
INTRAVENOUS | Status: DC | PRN
  Administered 2021-08-02: 60 mg via INTRAVENOUS

## 2021-08-02 MED ORDER — ACETAMINOPHEN 500 MG PO TABS
ORAL_TABLET | ORAL | Status: AC
  Filled 2021-08-02: qty 2

## 2021-08-02 MED ORDER — ONDANSETRON HCL 4 MG/2ML IJ SOLN: 4.0000 mg | Freq: Once | INTRAMUSCULAR | Status: DC | PRN

## 2021-08-02 MED ORDER — EPINEPHRINE PF 1 MG/ML IJ SOLN
INTRAMUSCULAR | Status: AC
  Filled 2021-08-02: qty 1

## 2021-08-02 MED ORDER — DEXAMETHASONE SODIUM PHOSPHATE 4 MG/ML IJ SOLN
INTRAMUSCULAR | Status: DC | PRN
  Administered 2021-08-02: 4 mg via INTRAVENOUS

## 2021-08-02 MED ORDER — PROPOFOL 10 MG/ML IV BOLUS
INTRAVENOUS | Status: AC
  Filled 2021-08-02: qty 20

## 2021-08-02 MED ORDER — LIDOCAINE HCL (CARDIAC) PF 100 MG/5ML IV SOSY
PREFILLED_SYRINGE | INTRAVENOUS | Status: DC | PRN
  Administered 2021-08-02: 80 mg via INTRAVENOUS

## 2021-08-02 MED ORDER — ROCURONIUM BROMIDE 10 MG/ML (PF) SYRINGE
PREFILLED_SYRINGE | INTRAVENOUS | Status: AC
  Filled 2021-08-02: qty 10

## 2021-08-02 MED ORDER — FENTANYL CITRATE (PF) 100 MCG/2ML IJ SOLN
INTRAMUSCULAR | Status: AC
  Filled 2021-08-02: qty 2

## 2021-08-02 MED ORDER — FENTANYL CITRATE (PF) 100 MCG/2ML IJ SOLN: 25.0000 ug | INTRAMUSCULAR | Status: DC | PRN

## 2021-08-02 SURGICAL SUPPLY — 28 items
ADAPTER TUBE FLEX ULTRASET (MISCELLANEOUS) IMPLANT
CANISTER SUCT 1200ML W/VALVE (MISCELLANEOUS) ×3 IMPLANT
CNTNR URN SCR LID CUP LEK RST (MISCELLANEOUS) IMPLANT
CONT SPEC 4OZ STRL OR WHT (MISCELLANEOUS)
DEFOGGER MIRROR 1QT (MISCELLANEOUS) ×1 IMPLANT
GAUZE SPONGE 4X4 12PLY STRL LF (GAUZE/BANDAGES/DRESSINGS) ×6 IMPLANT
GLOVE SURG ENC MOIS LTX SZ7.5 (GLOVE) ×3 IMPLANT
GOWN STRL REUS W/ TWL LRG LVL3 (GOWN DISPOSABLE) ×1 IMPLANT
GOWN STRL REUS W/TWL LRG LVL3 (GOWN DISPOSABLE) ×6
GUARD TEETH (MISCELLANEOUS) ×2 IMPLANT
MARKER SKIN DUAL TIP RULER LAB (MISCELLANEOUS) IMPLANT
NDL HYPO 18GX1.5 BLUNT FILL (NEEDLE) ×1 IMPLANT
NDL SPNL 22GX7 QUINCKE BK (NEEDLE) IMPLANT
NDL SPNL 25GX3.5 QUINCKE BL (NEEDLE) IMPLANT
NEEDLE HYPO 18GX1.5 BLUNT FILL (NEEDLE) ×3 IMPLANT
NEEDLE SPNL 22GX7 QUINCKE BK (NEEDLE) IMPLANT
NEEDLE SPNL 25GX3.5 QUINCKE BL (NEEDLE) IMPLANT
NS IRRIG 1000ML POUR BTL (IV SOLUTION) ×3 IMPLANT
PACK BASIN DAY SURGERY FS (CUSTOM PROCEDURE TRAY) ×1 IMPLANT
PATTIES SURGICAL .5 X3 (DISPOSABLE) ×3 IMPLANT
SHEET MEDIUM DRAPE 40X70 STRL (DRAPES) ×3 IMPLANT
SLEEVE SCD COMPRESS KNEE MED (STOCKING) IMPLANT
SURGILUBE 2OZ TUBE FLIPTOP (MISCELLANEOUS) IMPLANT
SYR CONTROL 10ML LL (SYRINGE) IMPLANT
SYR TB 1ML LL NO SAFETY (SYRINGE) ×2 IMPLANT
TOWEL GREEN STERILE FF (TOWEL DISPOSABLE) ×3 IMPLANT
TUBE CONNECTING 20'X1/4 (TUBING) ×1
TUBE CONNECTING 20X1/4 (TUBING) ×2 IMPLANT

## 2021-08-02 NOTE — Op Note (Signed)
DATE OF PROCEDURE:  08/02/2021                              OPERATIVE REPORT  SURGEON:  Leta Baptist, MD  PREOPERATIVE DIAGNOSES: 1.  Left base of tongue mass  POSTOPERATIVE DIAGNOSES: 1.  Left base of tongue mass  PROCEDURE PERFORMED: Direct laryngoscopy with biopsy of the tongue mass  ANESTHESIA:  General endotracheal tube anesthesia.  COMPLICATIONS:  None.  ESTIMATED BLOOD LOSS:  Minimal.  INDICATION FOR PROCEDURE:  Clare Fennimore is a 70 y.o. female with a history of a left base of tongue mass and left Level II neck mass.  According to the patient, she first noted her left neck mass 10 years ago. It has slightly increased in size.  The patient recently underwent a neck CT scan.  The CT showed an enhancing 1 cm left base of tongue mass. The appearance was suggestive of malignancy.  She also has a 2.8 cm necrotic left Level II neck mass. The radiographic findings were concerning for metastatic disease.   Based on the above findings, the decision was made for the patient to undergo the above-stated procedure. The risks, benefits, alternatives, and details of the procedure were discussed with the patient.  Questions were invited and answered.  Informed consent was obtained.  DESCRIPTION:  The patient was taken to the operating room and placed supine on the operating table.  General endotracheal tube anesthesia was administered by the anesthesiologist.  The patient was positioned and prepped and draped in a standard fashion for direct laryngoscopy.  A Dedo laryngoscope was used for examination.  The laryngoscope was inserted via the oral cavity into the pharynx.  Examination of the pharyngeal and laryngeal mucosa revealed no obvious lesion.  The epiglottis, vallecula, piriform sinuses, and the laryngeal inlet were all normal.  Slight fullness was noted within the left base of tongue.  Multiple biopsy specimens were obtained from the left base of tongue.  The specimens were sent to the  pathology department for permanent histologic identification.  Hemostasis was achieved with pledgets soaked with epinephrine.  The care of the patient was turned over to the anesthesiologist.  The patient was awakened from anesthesia without difficulty.  The patient was extubated and transferred to the recovery room in good condition.  OPERATIVE FINDINGS: Slight firmness was noted within the left base of tongue.  No obvious mucosal ulceration was noted.  SPECIMEN: Left base of tongue biopsy specimens  FOLLOWUP CARE:  The patient will be discharged home once awake and alert.  The patient will follow up in my office in approximately 1 week.  Tahmid Stonehocker W Elanie Hammitt 08/02/2021 4:05 PM

## 2021-08-02 NOTE — Discharge Instructions (Addendum)
The patient may resume all her previous activities and diet.  She has no postop restriction.   Post Anesthesia Home Care Instructions  Activity: Get plenty of rest for the remainder of the day. A responsible individual must stay with you for 24 hours following the procedure.  For the next 24 hours, DO NOT: -Drive a car -Paediatric nurse -Drink alcoholic beverages -Take any medication unless instructed by your physician -Make any legal decisions or sign important papers.  Meals: Start with liquid foods such as gelatin or soup. Progress to regular foods as tolerated. Avoid greasy, spicy, heavy foods. If nausea and/or vomiting occur, drink only clear liquids until the nausea and/or vomiting subsides. Call your physician if vomiting continues.  Special Instructions/Symptoms: Your throat may feel dry or sore from the anesthesia or the breathing tube placed in your throat during surgery. If this causes discomfort, gargle with warm salt water. The discomfort should disappear within 24 hours.  If you had a scopolamine patch placed behind your ear for the management of post- operative nausea and/or vomiting:  1. The medication in the patch is effective for 72 hours, after which it should be removed.  Wrap patch in a tissue and discard in the trash. Wash hands thoroughly with soap and water. 2. You may remove the patch earlier than 72 hours if you experience unpleasant side effects which may include dry mouth, dizziness or visual disturbances. 3. Avoid touching the patch. Wash your hands with soap and water after contact with the patch.     No tylenol until after 4pm today, if needed.

## 2021-08-02 NOTE — Anesthesia Postprocedure Evaluation (Signed)
Anesthesia Post Note  Patient: Grace [name]  Procedure(s) Performed: DIRECT LARYNGOSCOPY WITH TONGUE BIOPSY (Left: Mouth)     Patient location during evaluation: PACU Anesthesia Type: General Level of consciousness: awake and alert Pain management: pain level controlled Vital Signs Assessment: post-procedure vital signs reviewed and stable Respiratory status: spontaneous breathing, nonlabored ventilation and respiratory function stable Cardiovascular status: blood pressure returned to baseline and stable Postop Assessment: no apparent nausea or vomiting Anesthetic complications: no   No notable events documented.  Last Vitals:  Vitals:   08/02/21 1300 08/02/21 1327  BP: (!) 149/57 (!) 151/62  Pulse: 73 74  Resp: 14 16  Temp:  (!) 36.3 C  SpO2: 94% 96%    Last Pain:  Vitals:   08/02/21 1327  TempSrc: Oral  PainSc: 2                  Lynda Rainwater

## 2021-08-02 NOTE — Transfer of Care (Signed)
Immediate Anesthesia Transfer of Care Note  Patient: Grace [name]  Procedure(s) Performed: DIRECT LARYNGOSCOPY WITH TONGUE BIOPSY (Left: Mouth)  Patient Location: PACU  Anesthesia Type:General  Level of Consciousness: awake, alert  and oriented  Airway & Oxygen Therapy: Patient Spontanous Breathing and Patient connected to face mask oxygen  Post-op Assessment: Report given to RN and Post -op Vital signs reviewed and stable  Post vital signs: Reviewed and stable  Last Vitals:  Vitals Value Taken Time  BP 182/81 08/02/21 1240  Temp 36.5 C 08/02/21 1240  Pulse 74 08/02/21 1245  Resp 16 08/02/21 1245  SpO2 100 % 08/02/21 1245  Vitals shown include unvalidated device data.  Last Pain:  Vitals:   08/02/21 1240  TempSrc:   PainSc: 0-No pain      Patients Stated Pain Goal: 3 (11/64/35 3912)  Complications: No notable events documented.

## 2021-08-02 NOTE — H&P (Signed)
Cc: Left base of tongue mass, left Level II neck mass  HPI: The patient is a 70 year old female who presents today for evaluation of her left base of tongue mass and left Level II neck mass.  According to the patient, she first noted her left neck mass 10 years ago. It has slightly increased in size.  The patient recently underwent a neck CT scan.  The CT showed an enhancing 1 cm left base of tongue mass. The appearance was suggestive of malignancy.  She also has a 2.8 cm necrotic left Level II neck mass. The radiographic findings are concerning for metastatic disease.  The patient has no history of tobacco use.  She has no previous ENT surgery.  Currently she is minimally symptomatic.  She denies any significant dysphagia, odynophagia, sore throat, hoarseness or dyspnea.    The patient's review of systems (constitutional, eyes, ENT, cardiovascular, respiratory, GI, musculoskeletal, skin, neurologic, psychiatric, endocrine, hematologic, allergic) is noted in the ROS questionnaire.  It is reviewed with the patient.  Major events: None.  Ongoing medical problems: Diabetes, reflux, nausea, osteoporosis, dermatitis.  Family health history: Diabetes.  Social history: The patient is married. She denies the use of tobacco, alcohol or illegal drugs.    Exam: General: Communicates without difficulty, well nourished, no acute distress. Head: Normocephalic, no evidence injury, no tenderness, facial buttresses intact without stepoff. Face/sinus: No tenderness to palpation and percussion. Facial movement is normal and symmetric. Eyes: PERRL, EOMI. No scleral icterus, conjunctivae clear. Neuro: CN II exam reveals vision grossly intact.  No nystagmus at any point of gaze. Ears: Auricles well formed without lesions.  Ear canals are intact without mass or lesion.  No erythema or edema is appreciated.  The TMs are intact without fluid. Nose: External evaluation reveals normal support and skin without lesions.  Dorsum is  intact.  Anterior rhinoscopy reveals pink mucosa over anterior aspect of inferior turbinates and intact septum.  No purulence noted. Oral:  Oral cavity and oropharynx are intact, symmetric, without erythema or edema.  Mucosa is moist without visible lesions. Firmness is noted within the left base of tongue on palpation.  Neck: Full range of motion without pain.  Left level 2 mass. Thyroid bed within normal limits to palpation.  Parotid glands and submandibular glands equal bilaterally without mass.  Trachea is midline. Neuro:  CN 2-12 grossly intact. Gait normal. A flexible scope was inserted into the right nasal cavity and advanced towards the nasopharynx.  Visualized mucosa over the turbinates and septum were as described above.  The nasopharynx was clear.  Oropharyngeal walls were symmetric and mobile without lesion, mass, or edema.  Hypopharynx was also without  lesion or edema.  Larynx was mobile without lesions. Supraglottic structures were free of edema, mass, and asymmetry.  True vocal folds were white without mass or lesion.    Assessment  1.  The patient has a suspicious 1 cm left base of tongue mass and a 2.8 cm left Level II necrotic lymph node. The findings were concerning for base of tongue squamous cell carcinoma and left neck metastases.   2.  No obvious mucosal lesion is noted on todays physical exam and laryngoscopy evaluation.  However, firmness is noted within the left base of tongue on palpation.   Plan  1.  The physical exam and laryngoscopy findings are reviewed with the patient.  Her CT findings are also reviewed.  2.  Based on the above findings, the patient will benefit from undergoing direct laryngoscopy  and biopsy of her base of tongue lesion.   3.  The risks, benefits, alternatives and details of the procedure are reviewed with the patient.  She would like to proceed with the procedure.

## 2021-08-02 NOTE — Anesthesia Preprocedure Evaluation (Addendum)
Anesthesia Evaluation  Patient identified by MRN, date of birth, ID band Patient awake  General Assessment Comment:TONGUE MASS  Reviewed: Allergy & Precautions, NPO status , Patient's Chart, lab work & pertinent test results  History of Anesthesia Complications Negative for: history of anesthetic complications  Airway Mallampati: III  TM Distance: >3 FB Neck ROM: Full    Dental  (+) Teeth Intact, Dental Advisory Given, Caps, Poor Dentition   Pulmonary sleep apnea and Continuous Positive Airway Pressure Ventilation ,    Pulmonary exam normal breath sounds clear to auscultation       Cardiovascular hypertension, + Peripheral Vascular Disease  Normal cardiovascular exam Rhythm:Regular Rate:Normal     Neuro/Psych  Neuromuscular disease    GI/Hepatic negative GI ROS, Neg liver ROS,   Endo/Other  diabetes, Type 2, Oral Hypoglycemic Agents  Renal/GU Renal InsufficiencyRenal disease     Musculoskeletal negative musculoskeletal ROS (+)   Abdominal   Peds  Hematology negative hematology ROS (+)   Anesthesia Other Findings Day of surgery medications reviewed with the patient.  Reproductive/Obstetrics                            Anesthesia Physical Anesthesia Plan  ASA: 2  Anesthesia Plan: General   Post-op Pain Management: Tylenol PO (pre-op)   Induction: Intravenous  PONV Risk Score and Plan: 3 and Dexamethasone, Ondansetron and Treatment may vary due to age or medical condition  Airway Management Planned: Oral ETT  Additional Equipment:   Intra-op Plan:   Post-operative Plan: Extubation in OR  Informed Consent: I have reviewed the patients History and Physical, chart, labs and discussed the procedure including the risks, benefits and alternatives for the proposed anesthesia with the patient or authorized representative who has indicated his/her understanding and acceptance.      Dental advisory given  Plan Discussed with: CRNA  Anesthesia Plan Comments:         Anesthesia Quick Evaluation

## 2021-08-02 NOTE — Anesthesia Procedure Notes (Signed)
Procedure Name: Intubation Date/Time: 08/02/2021 12:04 PM Performed by: Maryella Shivers, CRNA Pre-anesthesia Checklist: Patient identified, Emergency Drugs available, Suction available and Patient being monitored Patient Re-evaluated:Patient Re-evaluated prior to induction Oxygen Delivery Method: Circle system utilized Preoxygenation: Pre-oxygenation with 100% oxygen Induction Type: IV induction Ventilation: Mask ventilation without difficulty Laryngoscope Size: Mac and 3 Grade View: Grade IV Tube type: Oral Tube size: 7.0 mm Number of attempts: 2 Airway Equipment and Method: Stylet, Oral airway and Bougie stylet Placement Confirmation: ETT inserted through vocal cords under direct vision, positive ETCO2 and breath sounds checked- equal and bilateral Tube secured with: Tape Dental Injury: Teeth and Oropharynx as per pre-operative assessment

## 2021-08-05 ENCOUNTER — Encounter (HOSPITAL_BASED_OUTPATIENT_CLINIC_OR_DEPARTMENT_OTHER): Payer: Self-pay | Admitting: Otolaryngology

## 2021-08-06 ENCOUNTER — Encounter (INDEPENDENT_AMBULATORY_CARE_PROVIDER_SITE_OTHER): Payer: Self-pay | Admitting: Ophthalmology

## 2021-08-06 ENCOUNTER — Other Ambulatory Visit: Payer: Self-pay

## 2021-08-06 ENCOUNTER — Ambulatory Visit (INDEPENDENT_AMBULATORY_CARE_PROVIDER_SITE_OTHER): Payer: Medicare Other | Admitting: Ophthalmology

## 2021-08-06 DIAGNOSIS — E113512 Type 2 diabetes mellitus with proliferative diabetic retinopathy with macular edema, left eye: Secondary | ICD-10-CM

## 2021-08-06 DIAGNOSIS — E113511 Type 2 diabetes mellitus with proliferative diabetic retinopathy with macular edema, right eye: Secondary | ICD-10-CM | POA: Diagnosis not present

## 2021-08-06 LAB — SURGICAL PATHOLOGY

## 2021-08-06 MED ORDER — AFLIBERCEPT 2MG/0.05ML IZ SOLN FOR KALEIDOSCOPE
2.0000 mg | INTRAVITREAL | Status: AC | PRN
  Administered 2021-08-06: 16:00:00 2 mg via INTRAVITREAL

## 2021-08-06 NOTE — Progress Notes (Signed)
08/06/2021     CHIEF COMPLAINT Patient presents for  Chief Complaint  Patient presents with   Retina Follow Up      HISTORY OF PRESENT ILLNESS: Grace [name] is a 70 y.o. female who presents to the clinic today for:   HPI     Retina Follow Up           Diagnosis: Diabetic Retinopathy   Laterality: left eye   Onset: 8 weeks ago   Severity: mild   Duration: 8 weeks   Course: gradually improving         Comments   8 week fu OS and oct and Eylea OS- Diabetic Macular Edema of the left eye  Pt states, "I actually feel like my vision is better than it has been. I feel like I can see an improvement."  LBS: 124      Last edited by Kendra Opitz, COA on 08/06/2021  3:49 PM.      Referring physician: Unk Pinto, MD [address] [place],  [place] [address]  HISTORICAL INFORMATION:   Selected notes from the MEDICAL RECORD NUMBER    Lab Results  Component Value Date   HGBA1C 7.5 (H) 06/10/2021     CURRENT MEDICATIONS: No current outpatient medications on file. (Ophthalmic Drugs)   No current facility-administered medications for this visit. (Ophthalmic Drugs)   Current Outpatient Medications (Other)  Medication Sig   Ascorbic Acid (VITAMIN C PO) Take 1 tablet by mouth daily.   Calcium 200 MG TABS Take by mouth. Unsure the mg   Cholecalciferol (VITAMIN D PO) Take 5,000 Units by mouth daily.   DULoxetine (CYMBALTA) 30 MG capsule Take 1 capsule (30 mg total) by mouth daily.   glimepiride (AMARYL) 2 MG tablet Take  1 tablet  2 x /day  with Meals  for Diabetes (Patient taking differently: Take  1 tablet  qday with Meals  for Diabetes)   glucose blood (ACCU-CHEK GUIDE) test strip Check blood sugar 3 times a day-DX-E11.69 AND E78.5.   Magnesium 250 MG TABS Take 2 tablets by mouth daily.    metFORMIN (GLUCOPHAGE-XR) 500 MG 24 hr tablet TAKE 2 TABLETS TWICE DAILY FOR DIABETES   Omega-3 Fatty Acids (OMEGA-3 FISH OIL PO) Take 1 capsule  by mouth daily.   zinc gluconate 50 MG tablet Take 50 mg by mouth daily.   No current facility-administered medications for this visit. (Other)      REVIEW OF SYSTEMS:    ALLERGIES Allergies  Allergen Reactions   Gabapentin Swelling    Swelling in legs and feet    PAST MEDICAL HISTORY Past Medical History:  Diagnosis Date   Abnormality of gait 07/28/2013   Chronic kidney disease    Diabetes ([place])    Diabetic peripheral neuropathy (HCC)    Diabetic retinopathy (HCC)    Foot drop, bilateral 07/31/2014   Peripheral edema    Polyneuropathy in diabetes(357.2) 07/28/2013   Sleep apnea    uses cpap   Tongue mass    Past Surgical History:  Procedure Laterality Date   ABDOMINAL HYSTERECTOMY     ANKLE FRACTURE SURGERY     left   CATARACT EXTRACTION W/PHACO Right 02/21/2014   Dr. Herbert Deaner   CATARACT EXTRACTION W/PHACO Left 04/10/2014   Dr. Herbert Deaner   CATARACT EXTRACTION, BILATERAL     DIRECT LARYNGOSCOPY Left 08/02/2021   Procedure: DIRECT LARYNGOSCOPY WITH TONGUE BIOPSY;  Surgeon: Leta Baptist, MD;  Location: [facility];  Service: ENT;  Laterality: Left;   HIP SURGERY     right   TONSILLECTOMY      FAMILY HISTORY Family History  Problem Relation Age of Onset   Diabetes Mother    Diabetes Brother    Neuropathy Neg Hx     SOCIAL HISTORY Social History   Tobacco Use   Smoking status: Never   Smokeless tobacco: Never  Substance Use Topics   Alcohol use: No   Drug use: No         OPHTHALMIC EXAM:  Base Eye Exam     Visual Acuity (Snellen - Linear)       Right Left   Dist [place] 20/40 20/100   Dist ph [place] 20/30 -2 20/50         Tonometry (Tonopen, 3:53 PM)       Right Left   Pressure 18 18         Pupils       Pupils Dark Light Shape React APD   Right PERRL 3 3 Round Minimal None   Left PERRL 3 3 Round Minimal None         Visual Fields (Counting fingers)       Left Right    Full Full         Extraocular Movement        Right Left    Full, Ortho Full, Ortho         Neuro/Psych     Oriented x3: Yes   Mood/Affect: Normal         Dilation     Left eye: 1.0% Mydriacyl, 2.5% Phenylephrine @ 3:53 PM           Slit Lamp and Fundus Exam     External Exam       Right Left   External Normal Normal         Slit Lamp Exam       Right Left   Lids/Lashes Normal Normal   Conjunctiva/Sclera White and quiet White and quiet   Cornea Clear Clear   Anterior Chamber Deep and quiet Deep and quiet   Iris Round and reactive Round and reactive   Lens Posterior chamber intraocular lens Posterior chamber intraocular lens   Anterior Vitreous Normal Normal         Fundus Exam       Right Left   Posterior Vitreous  Posterior vitreous detachment   Disc  Normal   C/D Ratio  0.25   Macula  Microaneurysms, Macular thickening, Mild clinically significant macular edema   Vessels  Severe nonproliferative diabetic retinopathy with retinal nonperfusion   Periphery  Good peripheral PRP            IMAGING AND PROCEDURES  Imaging and Procedures for 08/06/21  OCT, Retina - OU - Both Eyes       Left Eye Quality was good. Scan locations included subfoveal. Central Foveal Thickness: 340. Progression has been stable. Findings include abnormal foveal contour, cystoid macular edema.   Notes OS much improved CSME at 6-week interval.  Overall left eye much improved CSME at 8 weeks today, however slight worsening over the last 2 weeks.  Repeat injection OS today follow-up in 7 weeks  OD at 2 weeks post most recent injection looks great     Intravitreal Injection, Pharmacologic Agent - OS - Left Eye       Time Out 08/06/2021. 4:18 PM. Confirmed correct patient, procedure, site, and patient consented.  Anesthesia Topical anesthesia was used. Anesthetic medications included Proparacaine 0.5%.   Procedure Preparation included 5% betadine to ocular surface, 10% betadine to eyelids, Tobramycin 0.3%.  A 30 gauge needle was used.   Injection: 2 mg aflibercept 2 MG/0.05ML   Route: Intravitreal, Site: Left Eye   NDC: [id number], Lot: [id number], Waste: 0 mL   Post-op Post injection exam found visual acuity of at least counting fingers. The patient tolerated the procedure well. There were no complications. The patient received written and verbal post procedure care education. Post injection medications included ocuflox.              ASSESSMENT/PLAN:  Diabetic macular edema of right eye with proliferative retinopathy associated with type 2 diabetes mellitus (HCC) Today at 2 weeks postinjection improved macular findings and acuity  Proliferative diabetic retinopathy of left eye with macular edema associated with type 2 diabetes mellitus ([place]) Today at 8 weeks postinjection, pinhole acuity 20/50 remains excellent given the underlying CME.  The underlying CSME today is worse at 2 weeks after recent 6-week follow-up visit when the right eye was intentionally studied, yet now worse 2 weeks later OS, we will thus repeat injection today and anticipate follow-up OS in 7-week intervals      ICD-10-CM   1. Proliferative diabetic retinopathy of left eye with macular edema associated with type 2 diabetes mellitus (HCC)  Q30.0923 OCT, Retina - OU - Both Eyes    Intravitreal Injection, Pharmacologic Agent - OS - Left Eye    aflibercept (EYLEA) SOLN 2 mg    2. Diabetic macular edema of right eye with proliferative retinopathy associated with type 2 diabetes mellitus ([place])  E11.3511       1.  OS today at 8-week follow-up overall improved yet some recurrence of CSME over the last 2 weeks during the previous interval of 8 weeks.  Repeat injection Eylea today and follow-up henceforth at 7-week intervals  2.  OD improved 2 weeks after most recent injection  3.  Ophthalmic Meds Ordered this visit:  Meds ordered this encounter  Medications   aflibercept (EYLEA) SOLN 2 mg       Return in  about 7 weeks (around 09/24/2021) for dilate, OS, EYLEA OCT.  There are no Patient Instructions on file for this visit.   Explained the diagnoses, plan, and follow up with the patient and they expressed understanding.  Patient expressed understanding of the importance of proper follow up care.   Clent Demark Krrish Freund M.D. Diseases & Surgery of the Retina and Vitreous Retina & Diabetic [place] 08/06/21     Abbreviations: M myopia (nearsighted); A astigmatism; H hyperopia (farsighted); P presbyopia; Mrx spectacle prescription;  CTL contact lenses; OD right eye; OS left eye; OU both eyes  XT exotropia; ET esotropia; PEK punctate epithelial keratitis; PEE punctate epithelial erosions; DES dry eye syndrome; MGD meibomian gland dysfunction; ATs artificial tears; PFAT's preservative free artificial tears; Oto nuclear sclerotic cataract; PSC posterior subcapsular cataract; ERM epi-retinal membrane; PVD posterior vitreous detachment; RD retinal detachment; DM diabetes mellitus; DR diabetic retinopathy; NPDR non-proliferative diabetic retinopathy; PDR proliferative diabetic retinopathy; CSME clinically significant macular edema; DME diabetic macular edema; dbh dot blot hemorrhages; CWS cotton wool spot; POAG primary open angle glaucoma; C/D cup-to-disc ratio; HVF humphrey visual field; GVF goldmann visual field; OCT optical coherence tomography; IOP intraocular pressure; BRVO Branch retinal vein occlusion; CRVO central retinal vein occlusion; CRAO central retinal artery occlusion; BRAO branch retinal artery occlusion; RT retinal tear; SB scleral buckle; PPV pars  plana vitrectomy; VH Vitreous hemorrhage; PRP panretinal laser photocoagulation; IVK intravitreal kenalog; VMT vitreomacular traction; MH Macular hole;  NVD neovascularization of the disc; NVE neovascularization elsewhere; AREDS age related eye disease study; ARMD age related macular degeneration; POAG primary open angle glaucoma; EBMD epithelial/anterior  basement membrane dystrophy; ACIOL anterior chamber intraocular lens; IOL intraocular lens; PCIOL posterior chamber intraocular lens; Phaco/IOL phacoemulsification with intraocular lens placement; [place] photorefractive keratectomy; LASIK laser assisted in situ keratomileusis; HTN hypertension; DM diabetes mellitus; COPD chronic obstructive pulmonary disease

## 2021-08-06 NOTE — Assessment & Plan Note (Signed)
Today at 2 weeks postinjection improved macular findings and acuity

## 2021-08-06 NOTE — Assessment & Plan Note (Signed)
Today at 8 weeks postinjection, pinhole acuity 20/50 remains excellent given the underlying CME.  The underlying CSME today is worse at 2 weeks after recent 6-week follow-up visit when the right eye was intentionally studied, yet now worse 2 weeks later OS, we will thus repeat injection today and anticipate follow-up OS in 7-week intervals

## 2021-08-08 DIAGNOSIS — D487 Neoplasm of uncertain behavior of other specified sites: Secondary | ICD-10-CM | POA: Diagnosis not present

## 2021-08-08 DIAGNOSIS — D3705 Neoplasm of uncertain behavior of pharynx: Secondary | ICD-10-CM | POA: Diagnosis not present

## 2021-08-09 ENCOUNTER — Other Ambulatory Visit: Payer: Self-pay | Admitting: Radiology

## 2021-08-13 ENCOUNTER — Ambulatory Visit (HOSPITAL_COMMUNITY)
Admission: RE | Admit: 2021-08-13 | Discharge: 2021-08-13 | Disposition: A | Discharge status: Home / Self Care | Payer: Medicare Other | Source: Ambulatory Visit | Attending: Nurse Practitioner | Admitting: Nurse Practitioner

## 2021-08-13 ENCOUNTER — Other Ambulatory Visit: Payer: Self-pay

## 2021-08-13 DIAGNOSIS — K148 Other diseases of tongue: Secondary | ICD-10-CM | POA: Insufficient documentation

## 2021-08-13 DIAGNOSIS — M21372 Foot drop, left foot: Secondary | ICD-10-CM | POA: Diagnosis not present

## 2021-08-13 DIAGNOSIS — D3702 Neoplasm of uncertain behavior of tongue: Secondary | ICD-10-CM | POA: Diagnosis not present

## 2021-08-13 DIAGNOSIS — E1142 Type 2 diabetes mellitus with diabetic polyneuropathy: Secondary | ICD-10-CM | POA: Insufficient documentation

## 2021-08-13 DIAGNOSIS — E1122 Type 2 diabetes mellitus with diabetic chronic kidney disease: Secondary | ICD-10-CM | POA: Insufficient documentation

## 2021-08-13 DIAGNOSIS — M21371 Foot drop, right foot: Secondary | ICD-10-CM | POA: Insufficient documentation

## 2021-08-13 DIAGNOSIS — N189 Chronic kidney disease, unspecified: Secondary | ICD-10-CM | POA: Insufficient documentation

## 2021-08-13 DIAGNOSIS — E11311 Type 2 diabetes mellitus with unspecified diabetic retinopathy with macular edema: Secondary | ICD-10-CM | POA: Diagnosis not present

## 2021-08-13 DIAGNOSIS — R590 Localized enlarged lymph nodes: Secondary | ICD-10-CM | POA: Insufficient documentation

## 2021-08-13 LAB — GLUCOSE, CAPILLARY: Glucose-Capillary: 130 mg/dL — ABNORMAL HIGH (ref 70–99)

## 2021-08-13 MED ORDER — LIDOCAINE HCL (PF) 1 % IJ SOLN
INTRAMUSCULAR | Status: AC
  Filled 2021-08-13: qty 30

## 2021-08-13 MED ORDER — MIDAZOLAM HCL 2 MG/2ML IJ SOLN
INTRAMUSCULAR | Status: AC
  Filled 2021-08-13: qty 2

## 2021-08-13 MED ORDER — FENTANYL CITRATE (PF) 100 MCG/2ML IJ SOLN
INTRAMUSCULAR | Status: AC
  Filled 2021-08-13: qty 2

## 2021-08-13 MED ORDER — MIDAZOLAM HCL 2 MG/2ML IJ SOLN
INTRAMUSCULAR | Status: AC | PRN
  Administered 2021-08-13: 1 mg via INTRAVENOUS

## 2021-08-13 MED ORDER — FENTANYL CITRATE (PF) 100 MCG/2ML IJ SOLN
INTRAMUSCULAR | Status: AC | PRN
  Administered 2021-08-13: 50 ug via INTRAVENOUS

## 2021-08-13 MED ORDER — SODIUM CHLORIDE 0.9 % IV SOLN: INTRAVENOUS | Status: DC

## 2021-08-13 NOTE — H&P (Signed)
Chief Complaint: Patient was seen in consultation today for left neck swelling/mass  Referring Physician(s): Magda Bernheim  Supervising Physician: Mir, Sharen Heck  Patient Status: [facility] - Out-pt  History of Present Illness: Grace [name] is a 70 y.o. female with a medical history significant for CKD, DM with neuropathy/retinopathy, bilateral foot drop and left neck swelling. During her annual exam she was noted to have left cervical lymphadenopathy and imaging was ordered.   CT Soft Tissue Neck w/contrast 07/26/21 IMPRESSION: 1. 1 cm enhancing mass base of tongue on the left suspicious for carcinoma. 2. Large complex cyst in the left lateral mid neck suspicious for metastatic disease given the above finding. This could also be an inflammatory cyst. In addition there is a 6 mm low-density lymph node in the left neck which could be metastatic disease. Biopsy recommended 3. 4 mm nodule right upper lobe. Calcified granuloma right upper lobe. No follow-up needed if patient is low-risk. Non-contrast chest CT can be considered in 12 months if patient is high-risk. This recommendation follows the consensus statement: Guidelines for Management of Incidental Pulmonary Nodules Detected on CT Images: From the Fleischner Society 2017; Radiology 2017; 284:228-243.  She was referred to ENT and she underwent direct laryngoscopy with biopsy of the tongue mass on 08/02/21 - pathology was negative for malignancy.   Interventional Radiology has been asked to evaluate this patient for an image-guided biopsy of the left neck mass or the left neck lymph node. Imaging reviewed and procedure approved by Dr. Vernard Gambles.     Past Medical History:  Diagnosis Date   Abnormality of gait 07/28/2013   Chronic kidney disease    Diabetes ([place])    Diabetic peripheral neuropathy (HCC)    Diabetic retinopathy ([place])    Foot drop, bilateral 07/31/2014   Peripheral edema    Polyneuropathy in diabetes(357.2)  07/28/2013   Sleep apnea    uses cpap   Tongue mass     Past Surgical History:  Procedure Laterality Date   ABDOMINAL HYSTERECTOMY     ANKLE FRACTURE SURGERY     left   CATARACT EXTRACTION W/PHACO Right 02/21/2014   Dr. Herbert Deaner   CATARACT EXTRACTION W/PHACO Left 04/10/2014   Dr. Herbert Deaner   CATARACT EXTRACTION, BILATERAL     DIRECT LARYNGOSCOPY Left 08/02/2021   Procedure: DIRECT LARYNGOSCOPY WITH TONGUE BIOPSY;  Surgeon: Leta Baptist, MD;  Location: [facility];  Service: ENT;  Laterality: Left;   HIP SURGERY     right   TONSILLECTOMY      Allergies: Gabapentin  Medications: Prior to Admission medications   Medication Sig Start Date End Date Taking? Authorizing Provider  acetaminophen (TYLENOL) 500 MG tablet Take 1,500 mg by mouth every 8 (eight) hours as needed for moderate pain.   Yes [provider]  Calcium 200 MG TABS Take 400 mg by mouth daily.   Yes [provider]  Cholecalciferol (DIALYVITE VITAMIN D 5000) 125 MCG (5000 UT) capsule Take 5,000 Units by mouth daily.   Yes [provider]  DULoxetine (CYMBALTA) 30 MG capsule Take 1 capsule (30 mg total) by mouth daily. 01/22/21  Yes Suzzanne Cloud, NP  glimepiride (AMARYL) 2 MG tablet Take  1 tablet  2 x /day  with Meals  for Diabetes Patient taking differently: Take 2 mg by mouth daily. 02/03/21  Yes Unk Pinto, MD  Magnesium 250 MG TABS Take 500 mg by mouth daily.   Yes [provider]  metFORMIN (GLUCOPHAGE-XR) 500 MG 24  hr tablet TAKE 2 TABLETS TWICE DAILY FOR DIABETES 03/14/21  Yes Magda Bernheim, NP  Omega-3 350 MG CPDR Take 350 mg by mouth 2 (two) times daily.   Yes [provider]  vitamin C (ASCORBIC ACID) 500 MG tablet Take 500 mg by mouth daily.   Yes [provider]  zinc gluconate 50 MG tablet Take 50 mg by mouth daily.   Yes [provider]  glucose blood (ACCU-CHEK GUIDE) test strip Check blood sugar 3 times a day-DX-E11.69 AND E78.5.  11/10/19   Unk Pinto, MD     Family History  Problem Relation Age of Onset   Diabetes Mother    Diabetes Brother    Neuropathy Neg Hx     Social History   Socioeconomic History   Marital status: Married    Spouse name: Not on file   Number of children: 2   Years of education: college   Highest education level: Not on file  Occupational History   Occupation: part time  Tobacco Use   Smoking status: Never   Smokeless tobacco: Never  Substance and Sexual Activity   Alcohol use: No   Drug use: No   Sexual activity: Not on file  Other Topics Concern   Not on file  Social History Narrative   Patient is right handed.   Patient drinks about 4 cups of tea daily.   Social Determinants of Health   Financial Resource Strain: Not on file  Food Insecurity: Not on file  Transportation Needs: Not on file  Physical Activity: Not on file  Stress: Not on file  Social Connections: Not on file    Review of Systems: A 12 point ROS discussed and pertinent positives are indicated in the HPI above.  All other systems are negative.  Review of Systems  Constitutional:  Negative for appetite change and fatigue.  HENT:  Positive for trouble swallowing and voice change.        Occasional trouble swallowing; patient states voice is hoarse   Respiratory:  Negative for cough and shortness of breath.   Gastrointestinal:  Negative for diarrhea, nausea and vomiting.  Neurological:  Negative for dizziness and headaches.   Vital Signs: BP (!) 154/73    Pulse 68    Temp 98 F (36.7 C) (Oral)    Resp 15    Ht 5\' 4"  (1.626 m)    Wt 169 lb (76.7 kg)    SpO2 99%    BMI 29.01 kg/m   Physical Exam Constitutional:      General: She is not in acute distress.    Appearance: She is not ill-appearing.  HENT:     Head:     Comments: Left neck swelling; palpable mass     Mouth/Throat:     Mouth: Mucous membranes are moist.     Pharynx: Oropharynx is clear.  Cardiovascular:     Rate and Rhythm:  Normal rate and regular rhythm.     Pulses: Normal pulses.     Heart sounds: Normal heart sounds.  Pulmonary:     Effort: Pulmonary effort is normal.     Breath sounds: Normal breath sounds.  Abdominal:     General: Bowel sounds are normal.     Palpations: Abdomen is soft.     Tenderness: There is no abdominal tenderness.  Musculoskeletal:     Right lower leg: No edema.     Left lower leg: No edema.     Comments: Bilateral foot braces  for foot drop   Skin:    General: Skin is warm and dry.  Neurological:     Mental Status: She is alert and oriented to person, place, and time.    Imaging: CT Soft Tissue Neck W Contrast  Result Date: 07/27/2021 CLINICAL DATA:  Left neck mass EXAM: CT NECK WITH CONTRAST TECHNIQUE: Multidetector CT imaging of the neck was performed using the standard protocol following the bolus administration of intravenous contrast. CONTRAST:  14mL ISOVUE-300 IOPAMIDOL (ISOVUE-300) INJECTION 61% COMPARISON:  Neck ultrasound 07/03/2021 FINDINGS: Pharynx and larynx: 10 mm enhancing mass in the base of tongue on the left. This is concerning for carcinoma. Epiglottis and larynx normal Salivary glands: No inflammation, mass, or stone. Thyroid: Right upper pole nodule 12 mm. Right lower pole nodule 15 mm. Left upper pole nodule 10 mm. Thyroid ultrasound recommended. (Ref: J Am Coll Radiol. 2015 Feb;12(2): 143-50). Lymph nodes: Complex cystic mass in the left mid neck may represent a necrotic lymph node or other inflammatory cyst. This cyst corresponds to the prior ultrasound and measures 28 x 19 mm. There is mild mural nodularity along the wall the cyst. 6 mm cystic lymph node just below the larger cyst and posterior to the left submandibular gland. This may represent metastatic lymph node given the low density. No suspicious lymph nodes in the right neck. Vascular: Normal vascular enhancement. Limited intracranial: Negative Visualized orbits: Not adequately imaged. Mastoids and  visualized paranasal sinuses: Negative Skeleton: No acute skeletal abnormality. Upper chest: 4 mm nodule right upper lobe laterally. Calcified granuloma right upper lobe. Other: None IMPRESSION: 1 cm enhancing mass base of tongue on the left suspicious for carcinoma. Large complex cyst in the left lateral mid neck suspicious for metastatic disease given the above finding. This could also be an inflammatory cyst. In addition there is a 6 mm low-density lymph node in the left neck which could be metastatic disease. Biopsy recommended 4 mm nodule right upper lobe. Calcified granuloma right upper lobe. No follow-up needed if patient is low-risk. Non-contrast chest CT can be considered in 12 months if patient is high-risk. This recommendation follows the consensus statement: Guidelines for Management of Incidental Pulmonary Nodules Detected on CT Images: From the Fleischner Society 2017; Radiology 2017; 284:228-243. Electronically Signed   By: Franchot Gallo M.D.   On: 07/27/2021 14:52   Intravitreal Injection, Pharmacologic Agent - OD - Right Eye  Result Date: 07/23/2021 Time Out 07/23/2021. 4:13 PM. Confirmed correct patient, procedure, site, and patient consented. Anesthesia Topical anesthesia was used. Anesthetic medications included Lidocaine 4%. Procedure Preparation included 5% betadine to ocular surface, 10% betadine to eyelids, Tobramycin 0.3%. Injection: 2 mg aflibercept 2 MG/0.05ML   Route: Intravitreal, Site: Right Eye   NDC: A3590391, Lot: [id number], Waste: 0 mL Post-op Post injection exam found visual acuity of at least counting fingers. The patient tolerated the procedure well. There were no complications. The patient received written and verbal post procedure care education. Post injection medications were not given.   Intravitreal Injection, Pharmacologic Agent - OS - Left Eye  Result Date: 08/06/2021 Time Out 08/06/2021. 4:18 PM. Confirmed correct patient, procedure, site, and patient  consented. Anesthesia Topical anesthesia was used. Anesthetic medications included Proparacaine 0.5%. Procedure Preparation included 5% betadine to ocular surface, 10% betadine to eyelids, Tobramycin 0.3%. A 30 gauge needle was used. Injection: 2 mg aflibercept 2 MG/0.05ML   Route: Intravitreal, Site: Left Eye   NDC: A3590391, Lot: [id number], Waste: 0 mL Post-op Post injection exam found visual acuity  of at least counting fingers. The patient tolerated the procedure well. There were no complications. The patient received written and verbal post procedure care education. Post injection medications included ocuflox.   OCT, Retina - OU - Both Eyes  Result Date: 08/06/2021 Left Eye Quality was good. Scan locations included subfoveal. Central Foveal Thickness: 340. Progression has been stable. Findings include abnormal foveal contour, cystoid macular edema. Notes OS much improved CSME at 6-week interval.  Overall left eye much improved CSME at 8 weeks today, however slight worsening over the last 2 weeks.  Repeat injection OS today follow-up in 7 weeks OD at 2 weeks post most recent injection looks great  OCT, Retina - OU - Both Eyes  Result Date: 07/23/2021 Right Eye Quality was good. Scan locations included subfoveal. Central Foveal Thickness: 308. Progression has improved. Findings include abnormal foveal contour, epiretinal membrane. Left Eye Quality was good. Scan locations included subfoveal. Central Foveal Thickness: 321. Progression has improved. Findings include abnormal foveal contour, epiretinal membrane. Notes OD 5 week postinjection for diabetic CSME.  Epiretinal membrane remains.  Repeat injection intravitreal Eylea today at 5 weeks and follow-up again in 5 to 6 weeks OS also spontaneous improvement, today at 6 weeks postinjection OS,  follow-up as scheduled, much improved overall and sustained improvement   US THYROID  Result Date: 07/29/2021 CLINICAL DATA:  Incidental on CT. EXAM:  THYROID ULTRASOUND TECHNIQUE: Ultrasound examination of the thyroid gland and adjacent soft tissues was performed. COMPARISON:  07/26/2021 FINDINGS: Parenchymal Echotexture: Moderately heterogenous Isthmus: 0.2 cm Right lobe: 5.8 x 3.3 x 1.7 cm Left lobe: 5.0 x 2.2 x 1.2 cm _________________________________________________________ Estimated total number of nodules >/= 1 cm: 4 Number of spongiform nodules >/=  2 cm not described below (TR1): 0 Number of mixed cystic and solid nodules >/= 1.5 cm not described below (TR2): 0 _________________________________________________________ Nodule # 1: Location: Right; Mid Maximum size: 1.9 cm; Other 2 dimensions: 1.3 x 1.1 cm Composition: spongiform (0) Echogenicity: hypoechoic (2) Shape: not taller-than-wide (0) Margins: smooth (0) Echogenic foci: none (0) ACR TI-RADS total points: 2. ACR TI-RADS risk category: TR2 (2 points). ACR TI-RADS recommendations: This nodule does NOT meet TI-RADS criteria for biopsy or dedicated follow-up. _________________________________________________________ Nodule # 2: Location: Right; Mid Maximum size: 2.2 cm; Other 2 dimensions: 2.0 x 1.3 cm Composition: spongiform (0) Echogenicity: hypoechoic (2) Shape: not taller-than-wide (0) Margins: ill-defined (0) Echogenic foci: none (0) ACR TI-RADS total points: 2. ACR TI-RADS risk category: TR2 (2 points). ACR TI-RADS recommendations: This nodule does NOT meet TI-RADS criteria for biopsy or dedicated follow-up. _________________________________________________________ Nodule # 3: Location: Right; Inferior Maximum size: 2.4 cm; Other 2 dimensions: 1.9 x 1.5 cm Composition: cannot determine (2) Echogenicity: cannot determine (1) Shape: not taller-than-wide (0) Margins: cannot determine (0) Echogenic foci: none (0) ACR TI-RADS total points: 3. ACR TI-RADS risk category: TR3 (3 points). ACR TI-RADS recommendations: *Given size (>/= 1.5 - 2.4 cm) and appearance, a follow-up ultrasound in 1 year should be  considered based on TI-RADS criteria. _________________________________________________________ Nodule # 4: Location: Left; Mid Maximum size: 1.5 cm; Other 2 dimensions: 0.2 x 0.9 cm Composition: spongiform (0) Echogenicity: hypoechoic (2) Shape: not taller-than-wide (0) Margins: ill-defined (0) Echogenic foci: none (0) ACR TI-RADS total points: 2. ACR TI-RADS risk category: . ACR TI-RADS recommendations: This nodule does NOT meet TI-RADS criteria for biopsy or dedicated follow-up. _________________________________________________________ No cervical lymphadenopathy. IMPRESSION: 1. Multinodular goiter. 2. Indeterminate right inferior thyroid nodule (labeled 3, 2.4 cm) meets criteria (TI-RADS category 3)  for 1 year ultrasound surveillance. 3. The remaining visualized thyroid nodules appear benign and do not meet criteria (TI-RADS category 2) for dedicated ultrasound follow-up or tissue sampling. The above is in keeping with the ACR TI-RADS recommendations - J Am Coll Radiol 2017;14:587-595. Ruthann Cancer, MD [facility] Radiology Electronically Signed   By: Ruthann Cancer M.D.   On: 07/29/2021 15:34    Labs:  CBC: Recent Labs    12/05/20 0951 06/10/21 1104 06/26/21 1145  WBC 7.0 7.9 7.8  HGB 12.7 12.5 12.6  HCT 38.8 38.7 38.1  PLT 379 405* 378    COAGS: No results for input(s): INR, APTT in the last 8760 hours.  BMP: Recent Labs    12/05/20 0951 06/10/21 1104 06/26/21 1145 08/01/21 1430 08/02/21 0945  NA 142 141 146 136 139  K 5.3 5.6* 5.8* 5.4* 4.6  CL 101 101 104 99 102  CO2 32 33* 27 29 28   GLUCOSE 134* 157* 128* 294* 125*  BUN 21 24 24 22 19   CALCIUM 10.5* 10.4 10.2 9.6 9.6  CREATININE 0.97* 0.95 0.87 1.01* 1.07*  GFRNONAA 59*  --   --  60* 56*  GFRAA 69  --   --   --   --     LIVER FUNCTION TESTS: Recent Labs    12/05/20 0951 06/10/21 1104  BILITOT 0.3 0.3  AST 17 16  ALT 23 20  PROT 6.1 6.3    TUMOR MARKERS: No  results for input(s): AFPTM, CEA, CA199, CHROMGRNA in the last 8760 hours.  Assessment and Plan:  Tongue lesion; left neck mass; left neck lymphadenopathy: Grace [name], 70 year old female, presents today to the [facility] department for an image-guided biopsy of either the left neck mass or the left neck lymph node - to be determined by Dr. Dwaine Gale once ultrasound imaging has been obtained.   Risks and benefits of this procedure were discussed with the patient and/or patient's family including, but not limited to bleeding, infection, damage to adjacent structures or low yield requiring additional tests.  All of the questions were answered and there is agreement to proceed. She has been NPO.   Consent signed and in chart.   Thank you for this interesting consult.  I greatly enjoyed meeting Grace [name] and look forward to participating in their care.  A copy of this report was sent to the requesting provider on this date.  Electronically Signed: Soyla Dryer, AGACNP-BC [phone] 08/13/2021, 11:33 AM   I spent a total of  30 Minutes   in face to face in clinical consultation, greater than 50% of which was counseling/coordinating care for left neck mass biopsy or left neck lymph node biopsy

## 2021-08-13 NOTE — Procedures (Signed)
Interventional Radiology Procedure Note  Procedure: US guided left neck lymph node biopsy  Indication: Tongue base lesion and enlarged left neck lymph node  Findings: Please refer to procedural dictation for full description.  Complications: None  EBL: < 10 mL  Miachel Roux, MD [phone]

## 2021-08-14 LAB — SURGICAL PATHOLOGY

## 2021-08-26 ENCOUNTER — Telehealth: Payer: Self-pay

## 2021-08-26 NOTE — Telephone Encounter (Signed)
Biopsy did not show cancer cells I am sending a note to Dr. Benjamine Mola to determine if he recommends removal of the lymph node or follow up

## 2021-08-26 NOTE — Telephone Encounter (Signed)
Hasn't heard from path results from biopsy of neck done on December 27th

## 2021-08-27 ENCOUNTER — Ambulatory Visit (INDEPENDENT_AMBULATORY_CARE_PROVIDER_SITE_OTHER): Payer: Medicare Other | Admitting: Ophthalmology

## 2021-08-27 ENCOUNTER — Other Ambulatory Visit: Payer: Self-pay

## 2021-08-27 ENCOUNTER — Encounter (INDEPENDENT_AMBULATORY_CARE_PROVIDER_SITE_OTHER): Payer: Self-pay | Admitting: Ophthalmology

## 2021-08-27 DIAGNOSIS — H35371 Puckering of macula, right eye: Secondary | ICD-10-CM | POA: Diagnosis not present

## 2021-08-27 DIAGNOSIS — E113511 Type 2 diabetes mellitus with proliferative diabetic retinopathy with macular edema, right eye: Secondary | ICD-10-CM

## 2021-08-27 DIAGNOSIS — E113512 Type 2 diabetes mellitus with proliferative diabetic retinopathy with macular edema, left eye: Secondary | ICD-10-CM | POA: Diagnosis not present

## 2021-08-27 MED ORDER — AFLIBERCEPT 2MG/0.05ML IZ SOLN FOR KALEIDOSCOPE
2.0000 mg | INTRAVITREAL | Status: AC | PRN
  Administered 2021-08-27: 2 mg via INTRAVITREAL

## 2021-08-27 NOTE — Assessment & Plan Note (Signed)
Today at 3 weeks post most recent injection looks great with no residual CSME follow-up as scheduled

## 2021-08-27 NOTE — Assessment & Plan Note (Signed)
Peripheral PRP may be required in the future to decrease vegF load so as to minimize ongoing need for antivegF for recurrences of CME, CSME.  Nonetheless at 5-week interval no recurrence of CSME OD today.  We will repeat injection today and maintain 5 to 6-week interval OD

## 2021-08-27 NOTE — Assessment & Plan Note (Signed)
Minor epiretinal membrane nasal to FAZ OD, no impact on acuity continue to observe no surgical intervention required

## 2021-08-27 NOTE — Progress Notes (Signed)
08/27/2021     CHIEF COMPLAINT Patient presents for  Chief Complaint  Patient presents with   Retina Follow Up    History of PDR with CSME OU, most recent evaluation was OS 3 weeks previous  OD at 5-week follow-up today postinjection antivegF, likely injection antivegF today  HISTORY OF PRESENT ILLNESS: Grace [name] is a 71 y.o. female who presents to the clinic today for:   HPI     Retina Follow Up           Diagnosis: Diabetic Retinopathy   Laterality: right eye   Onset: 5 weeks ago   Severity: mild   Duration: 5 weeks   Course: stable         Comments   5 week fu OD, OCT and Eylea OD- Diabetic macular edema of right eye with PDR- Last injection was 07/23/2021  Pt states [facility] OU stable since last visit. Pt denies FOL, floaters, or ocular pain OU.        Last edited by Kendra Opitz, COA on 08/27/2021  3:41 PM.      Referring physician: Unk Pinto, MD [address] [place],  [place] [address]  HISTORICAL INFORMATION:   Selected notes from the MEDICAL RECORD NUMBER    Lab Results  Component Value Date   HGBA1C 7.5 (H) 06/10/2021     CURRENT MEDICATIONS: No current outpatient medications on file. (Ophthalmic Drugs)   No current facility-administered medications for this visit. (Ophthalmic Drugs)   Current Outpatient Medications (Other)  Medication Sig   acetaminophen (TYLENOL) 500 MG tablet Take 1,500 mg by mouth every 8 (eight) hours as needed for moderate pain.   Calcium 200 MG TABS Take 400 mg by mouth daily.   Cholecalciferol (DIALYVITE VITAMIN D 5000) 125 MCG (5000 UT) capsule Take 5,000 Units by mouth daily.   DULoxetine (CYMBALTA) 30 MG capsule Take 1 capsule (30 mg total) by mouth daily.   glimepiride (AMARYL) 2 MG tablet Take  1 tablet  2 x /day  with Meals  for Diabetes (Patient taking differently: Take 2 mg by mouth daily.)   glucose blood (ACCU-CHEK GUIDE) test strip Check blood sugar 3 times a day-DX-E11.69  AND E78.5.   Magnesium 250 MG TABS Take 500 mg by mouth daily.   metFORMIN (GLUCOPHAGE-XR) 500 MG 24 hr tablet TAKE 2 TABLETS TWICE DAILY FOR DIABETES   Omega-3 350 MG CPDR Take 350 mg by mouth 2 (two) times daily.   vitamin C (ASCORBIC ACID) 500 MG tablet Take 500 mg by mouth daily.   zinc gluconate 50 MG tablet Take 50 mg by mouth daily.   No current facility-administered medications for this visit. (Other)      REVIEW OF SYSTEMS: ROS   Negative for: Constitutional, Gastrointestinal, Neurological, Skin, Genitourinary, Musculoskeletal, HENT, Endocrine, Cardiovascular, Eyes, Respiratory, Psychiatric, Allergic/Imm, Heme/Lymph Last edited by Hurman Horn, MD on 08/27/2021  4:16 PM.       ALLERGIES Allergies  Allergen Reactions   Gabapentin Swelling    Swelling in legs and feet    PAST MEDICAL HISTORY Past Medical History:  Diagnosis Date   Abnormality of gait 07/28/2013   Chronic kidney disease    Diabetes ([place])    Diabetic peripheral neuropathy ([place])    Diabetic retinopathy ([place])    Foot drop, bilateral 07/31/2014   Peripheral edema    Polyneuropathy in diabetes(357.2) 07/28/2013   Sleep apnea    uses cpap   Tongue mass  Past Surgical History:  Procedure Laterality Date   ABDOMINAL HYSTERECTOMY     ANKLE FRACTURE SURGERY     left   CATARACT EXTRACTION W/PHACO Right 02/21/2014   Dr. Herbert Deaner   CATARACT EXTRACTION W/PHACO Left 04/10/2014   Dr. Herbert Deaner   CATARACT EXTRACTION, BILATERAL     DIRECT LARYNGOSCOPY Left 08/02/2021   Procedure: DIRECT LARYNGOSCOPY WITH TONGUE BIOPSY;  Surgeon: Leta Baptist, MD;  Location: [facility];  Service: ENT;  Laterality: Left;   HIP SURGERY     right   TONSILLECTOMY      FAMILY HISTORY Family History  Problem Relation Age of Onset   Diabetes Mother    Diabetes Brother    Neuropathy Neg Hx     SOCIAL HISTORY Social History   Tobacco Use   Smoking status: Never   Smokeless tobacco: Never  Substance Use  Topics   Alcohol use: No   Drug use: No         OPHTHALMIC EXAM:  Base Eye Exam     Visual Acuity (ETDRS)       Right Left   Dist [place] 20/50 -2 20/80 -2   Dist ph Grace [name] 20/40 20/50 +1         Tonometry (Tonopen, 3:45 PM)       Right Left   Pressure 17 15         Pupils       Pupils Dark Light Shape React APD   Right PERRL 3 3 Round Minimal None   Left PERRL 3 3 Round Minimal None         Visual Fields (Counting fingers)       Left Right    Full Full         Extraocular Movement       Right Left    Full, Ortho Full, Ortho         Neuro/Psych     Oriented x3: Yes   Mood/Affect: Normal         Dilation     Right eye: 1.0% Mydriacyl, 2.5% Phenylephrine @ 3:46 PM           Slit Lamp and Fundus Exam     External Exam       Right Left   External Normal Normal         Slit Lamp Exam       Right Left   Lids/Lashes Normal Normal   Conjunctiva/Sclera White and quiet White and quiet   Cornea Clear Clear   Anterior Chamber Deep and quiet Deep and quiet   Iris Round and reactive Round and reactive   Lens Posterior chamber intraocular lens Posterior chamber intraocular lens   Anterior Vitreous Normal Normal         Fundus Exam       Right Left   Posterior Vitreous Posterior vitreous detachment    Disc Normal    C/D Ratio 0.3    Macula Epiretinal membrane, good focal laser, Macular thickening minor    Vessels PDR-quiet    Periphery good PRP, room for anterior PRP present temporally and nasally and superiorly             IMAGING AND PROCEDURES  Imaging and Procedures for 08/27/21  OCT, Retina - OU - Both Eyes       Right Eye Quality was good. Scan locations included subfoveal. Central Foveal Thickness: 300. Findings include abnormal foveal contour, epiretinal membrane.   Left Eye Quality  was good. Scan locations included subfoveal. Central Foveal Thickness: 340. Progression has been stable. Findings include abnormal  foveal contour, cystoid macular edema.   Notes OS much improved CSME at 3week interval.  Follow-up OS as scheduled   OD at 5 weeks post most recent injection looks great, with no recurrence of macular edema OD     Intravitreal Injection, Pharmacologic Agent - OD - Right Eye       Time Out 08/27/2021. 4:17 PM. Confirmed correct patient, procedure, site, and patient consented.   Anesthesia Topical anesthesia was used. Anesthetic medications included Lidocaine 4%.   Procedure Preparation included 5% betadine to ocular surface, 10% betadine to eyelids, Tobramycin 0.3%.   Injection: 2 mg aflibercept 2 MG/0.05ML   Route: Intravitreal, Site: Right Eye   NDC: [id number], Lot: [id number], Waste: 0 mL   Post-op Post injection exam found visual acuity of at least counting fingers. The patient tolerated the procedure well. There were no complications. The patient received written and verbal post procedure care education. Post injection medications were not given.              ASSESSMENT/PLAN:  Diabetic macular edema of right eye with proliferative retinopathy associated with type 2 diabetes mellitus (HCC) Peripheral PRP may be required in the future to decrease vegF load so as to minimize ongoing need for antivegF for recurrences of CME, CSME.  Nonetheless at 5-week interval no recurrence of CSME OD today.  We will repeat injection today and maintain 5 to 6-week interval OD  Proliferative diabetic retinopathy of left eye with macular edema associated with type 2 diabetes mellitus ([place]) Today at 3 weeks post most recent injection looks great with no residual CSME follow-up as scheduled  Right epiretinal membrane Minor epiretinal membrane nasal to FAZ OD, no impact on acuity continue to observe no surgical intervention required     ICD-10-CM   1. Diabetic macular edema of right eye with proliferative retinopathy associated with type 2 diabetes mellitus (HCC)  E11.3511 OCT, Retina  - OU - Both Eyes    Intravitreal Injection, Pharmacologic Agent - OD - Right Eye    aflibercept (EYLEA) SOLN 2 mg    2. Proliferative diabetic retinopathy of left eye with macular edema associated with type 2 diabetes mellitus ([place])  J24.2683     3. Right epiretinal membrane  H35.371       1.  OD, no residual CSME today at shorter interval follow-up of 5 weeks repeat injection today to maintain.  May 1-Day attempt further anterior PRP to decrease patient's treatment burden by decreasing vegF burden in the eye.  2.  OS follow-up as scheduled  3.  Ophthalmic Meds Ordered this visit:  Meds ordered this encounter  Medications   aflibercept (EYLEA) SOLN 2 mg       Return in about 5 weeks (around 10/01/2021) for dilate, OD, EYLEA OCT,,, and follow-up OS as scheduled.  There are no Patient Instructions on file for this visit.   Explained the diagnoses, plan, and follow up with the patient and they expressed understanding.  Patient expressed understanding of the importance of proper follow up care.   Clent Demark Bryar Dahms M.D. Diseases & Surgery of the Retina and Vitreous [facility]ina [facility] Diabetic [place] 08/27/21     Abbreviations: M myopia (nearsighted); A astigmatism; H hyperopia (farsighted); P presbyopia; Mrx spectacle prescription;  CTL contact lenses; OD right eye; OS left eye; OU both eyes  XT exotropia; ET esotropia; PEK punctate epithelial keratitis; PEE punctate  epithelial erosions; DES dry eye syndrome; MGD meibomian gland dysfunction; ATs artificial tears; PFAT's preservative free artificial tears; [place] nuclear sclerotic cataract; PSC posterior subcapsular cataract; ERM epi-retinal membrane; PVD posterior vitreous detachment; RD retinal detachment; DM diabetes mellitus; DR diabetic retinopathy; NPDR non-proliferative diabetic retinopathy; PDR proliferative diabetic retinopathy; CSME clinically significant macular edema; DME diabetic macular edema; dbh dot blot hemorrhages; CWS cotton  wool spot; POAG primary open angle glaucoma; C/D cup-to-disc ratio; HVF humphrey visual field; GVF goldmann visual field; OCT optical coherence tomography; IOP intraocular pressure; BRVO Branch retinal vein occlusion; CRVO central retinal vein occlusion; CRAO central retinal artery occlusion; BRAO branch retinal artery occlusion; RT retinal tear; SB scleral buckle; PPV pars plana vitrectomy; VH Vitreous hemorrhage; PRP panretinal laser photocoagulation; IVK intravitreal kenalog; VMT vitreomacular traction; MH Macular hole;  NVD neovascularization of the disc; NVE neovascularization elsewhere; AREDS age related eye disease study; ARMD age related macular degeneration; POAG primary open angle glaucoma; EBMD epithelial/anterior basement membrane dystrophy; ACIOL anterior chamber intraocular lens; IOL intraocular lens; PCIOL posterior chamber intraocular lens; Phaco/IOL phacoemulsification with intraocular lens placement; [place] photorefractive keratectomy; LASIK laser assisted in situ keratomileusis; HTN hypertension; DM diabetes mellitus; COPD chronic obstructive pulmonary disease

## 2021-08-28 ENCOUNTER — Other Ambulatory Visit: Payer: Self-pay | Admitting: Otolaryngology

## 2021-08-28 DIAGNOSIS — D487 Neoplasm of uncertain behavior of other specified sites: Secondary | ICD-10-CM | POA: Diagnosis not present

## 2021-08-28 DIAGNOSIS — D3705 Neoplasm of uncertain behavior of pharynx: Secondary | ICD-10-CM | POA: Diagnosis not present

## 2021-08-29 ENCOUNTER — Other Ambulatory Visit: Payer: Self-pay

## 2021-08-29 ENCOUNTER — Encounter (HOSPITAL_BASED_OUTPATIENT_CLINIC_OR_DEPARTMENT_OTHER): Payer: Self-pay | Admitting: Otolaryngology

## 2021-09-02 NOTE — Telephone Encounter (Signed)
Done by accident.  °

## 2021-09-04 ENCOUNTER — Encounter (HOSPITAL_BASED_OUTPATIENT_CLINIC_OR_DEPARTMENT_OTHER)
Admission: RE | Admit: 2021-09-04 | Discharge: 2021-09-04 | Disposition: A | Discharge status: Home / Self Care | Payer: Medicare Other | Source: Ambulatory Visit | Attending: Otolaryngology | Admitting: Otolaryngology

## 2021-09-04 DIAGNOSIS — Z01812 Encounter for preprocedural laboratory examination: Secondary | ICD-10-CM | POA: Insufficient documentation

## 2021-09-04 LAB — BASIC METABOLIC PANEL
Anion gap: 17 — ABNORMAL HIGH (ref 5–15)
BUN: 22 mg/dL (ref 8–23)
CO2: 30 mmol/L (ref 22–32)
Calcium: 10.4 mg/dL — ABNORMAL HIGH (ref 8.9–10.3)
Chloride: 96 mmol/L — ABNORMAL LOW (ref 98–111)
Creatinine, Ser: 0.96 mg/dL (ref 0.44–1.00)
GFR, Estimated: >60 mL/min (ref >60)
Glucose, Bld: 200 mg/dL — ABNORMAL HIGH (ref 70–99)
Potassium: 4.6 mmol/L (ref 3.5–5.1)
Sodium: 143 mmol/L (ref 135–145)

## 2021-09-06 ENCOUNTER — Ambulatory Visit (HOSPITAL_BASED_OUTPATIENT_CLINIC_OR_DEPARTMENT_OTHER)
Admission: RE | Admit: 2021-09-06 | Discharge: 2021-09-06 | Disposition: A | Discharge status: Home / Self Care | Payer: Medicare Other | Attending: Otolaryngology | Admitting: Otolaryngology

## 2021-09-06 ENCOUNTER — Encounter (HOSPITAL_BASED_OUTPATIENT_CLINIC_OR_DEPARTMENT_OTHER): Payer: Self-pay | Admitting: Otolaryngology

## 2021-09-06 ENCOUNTER — Other Ambulatory Visit: Payer: Self-pay

## 2021-09-06 ENCOUNTER — Ambulatory Visit (HOSPITAL_BASED_OUTPATIENT_CLINIC_OR_DEPARTMENT_OTHER): Payer: Medicare Other | Admitting: Anesthesiology

## 2021-09-06 ENCOUNTER — Encounter (HOSPITAL_BASED_OUTPATIENT_CLINIC_OR_DEPARTMENT_OTHER)
Admission: RE | Disposition: A | Discharge status: Home / Self Care | Payer: Self-pay | Source: Home / Self Care | Attending: Otolaryngology

## 2021-09-06 DIAGNOSIS — G709 Myoneural disorder, unspecified: Secondary | ICD-10-CM | POA: Insufficient documentation

## 2021-09-06 DIAGNOSIS — Q180 Sinus, fistula and cyst of branchial cleft: Secondary | ICD-10-CM | POA: Diagnosis not present

## 2021-09-06 DIAGNOSIS — E1151 Type 2 diabetes mellitus with diabetic peripheral angiopathy without gangrene: Secondary | ICD-10-CM

## 2021-09-06 DIAGNOSIS — I739 Peripheral vascular disease, unspecified: Secondary | ICD-10-CM | POA: Diagnosis not present

## 2021-09-06 DIAGNOSIS — E119 Type 2 diabetes mellitus without complications: Secondary | ICD-10-CM | POA: Insufficient documentation

## 2021-09-06 DIAGNOSIS — I10 Essential (primary) hypertension: Secondary | ICD-10-CM | POA: Insufficient documentation

## 2021-09-06 DIAGNOSIS — G4730 Sleep apnea, unspecified: Secondary | ICD-10-CM | POA: Insufficient documentation

## 2021-09-06 DIAGNOSIS — D487 Neoplasm of uncertain behavior of other specified sites: Secondary | ICD-10-CM | POA: Diagnosis not present

## 2021-09-06 DIAGNOSIS — K219 Gastro-esophageal reflux disease without esophagitis: Secondary | ICD-10-CM | POA: Diagnosis not present

## 2021-09-06 DIAGNOSIS — R221 Localized swelling, mass and lump, neck: Secondary | ICD-10-CM | POA: Diagnosis not present

## 2021-09-06 HISTORY — DX: Failed or difficult intubation, initial encounter: T88.4XXA

## 2021-09-06 HISTORY — PX: MASS BIOPSY: SHX5445

## 2021-09-06 HISTORY — DX: Gastro-esophageal reflux disease without esophagitis: K21.9

## 2021-09-06 LAB — GLUCOSE, CAPILLARY
Glucose-Capillary: 185 mg/dL — ABNORMAL HIGH (ref 70–99)
Glucose-Capillary: 191 mg/dL — ABNORMAL HIGH (ref 70–99)

## 2021-09-06 SURGERY — BIOPSY, MASS, NECK
Anesthesia: General | Site: Neck | Laterality: Left

## 2021-09-06 MED ORDER — ACETAMINOPHEN 500 MG PO TABS
ORAL_TABLET | ORAL | Status: AC
  Filled 2021-09-06: qty 2

## 2021-09-06 MED ORDER — ONDANSETRON HCL 4 MG/2ML IJ SOLN
INTRAMUSCULAR | Status: AC
  Filled 2021-09-06: qty 2

## 2021-09-06 MED ORDER — SUCCINYLCHOLINE CHLORIDE 200 MG/10ML IV SOSY
PREFILLED_SYRINGE | INTRAVENOUS | Status: DC | PRN
Start: 2021-09-06 — End: 2021-09-06
  Administered 2021-09-06: 100 mg via INTRAVENOUS
  Administered 2021-09-06: 40 mg via INTRAVENOUS

## 2021-09-06 MED ORDER — LIDOCAINE-EPINEPHRINE 1 %-1:100000 IJ SOLN
INTRAMUSCULAR | Status: DC | PRN
Start: 2021-09-06 — End: 2021-09-06
  Administered 2021-09-06: 3 mL

## 2021-09-06 MED ORDER — PROMETHAZINE HCL 25 MG/ML IJ SOLN: 6.2500 mg | INTRAMUSCULAR | Status: DC | PRN

## 2021-09-06 MED ORDER — FENTANYL CITRATE (PF) 100 MCG/2ML IJ SOLN
INTRAMUSCULAR | Status: DC | PRN
  Administered 2021-09-06: 50 ug via INTRAVENOUS

## 2021-09-06 MED ORDER — CEFAZOLIN SODIUM-DEXTROSE 1-4 GM/50ML-% IV SOLN
INTRAVENOUS | Status: DC | PRN
  Administered 2021-09-06: 2 g via INTRAVENOUS

## 2021-09-06 MED ORDER — LACTATED RINGERS IV SOLN: INTRAVENOUS | Status: DC

## 2021-09-06 MED ORDER — PROPOFOL 10 MG/ML IV BOLUS
INTRAVENOUS | Status: DC | PRN
  Administered 2021-09-06: 150 mg via INTRAVENOUS
  Administered 2021-09-06: 50 mg via INTRAVENOUS

## 2021-09-06 MED ORDER — PROPOFOL 10 MG/ML IV BOLUS
INTRAVENOUS | Status: AC
  Filled 2021-09-06: qty 20

## 2021-09-06 MED ORDER — ONDANSETRON HCL 4 MG/2ML IJ SOLN
INTRAMUSCULAR | Status: DC | PRN
  Administered 2021-09-06: 4 mg via INTRAVENOUS

## 2021-09-06 MED ORDER — 0.9 % SODIUM CHLORIDE (POUR BTL) OPTIME
TOPICAL | Status: DC | PRN
Start: 2021-09-06 — End: 2021-09-06
  Administered 2021-09-06: 60 mL

## 2021-09-06 MED ORDER — ACETAMINOPHEN 500 MG PO TABS
1000.0000 mg | ORAL_TABLET | Freq: Once | ORAL | Status: AC
  Administered 2021-09-06: 1000 mg via ORAL

## 2021-09-06 MED ORDER — LIDOCAINE HCL (CARDIAC) PF 100 MG/5ML IV SOSY
PREFILLED_SYRINGE | INTRAVENOUS | Status: DC | PRN
  Administered 2021-09-06: 50 mg via INTRAVENOUS

## 2021-09-06 MED ORDER — OXYMETAZOLINE HCL 0.05 % NA SOLN
NASAL | Status: AC
  Filled 2021-09-06: qty 30

## 2021-09-06 MED ORDER — AMISULPRIDE (ANTIEMETIC) 5 MG/2ML IV SOLN: 10.0000 mg | Freq: Once | INTRAVENOUS | Status: DC | PRN

## 2021-09-06 MED ORDER — BACITRACIN ZINC 500 UNIT/GM EX OINT
TOPICAL_OINTMENT | CUTANEOUS | Status: AC
  Filled 2021-09-06: qty 0.9

## 2021-09-06 MED ORDER — LIDOCAINE-EPINEPHRINE 1 %-1:100000 IJ SOLN
INTRAMUSCULAR | Status: AC
  Filled 2021-09-06: qty 1

## 2021-09-06 MED ORDER — DEXAMETHASONE SODIUM PHOSPHATE 4 MG/ML IJ SOLN
INTRAMUSCULAR | Status: DC | PRN
  Administered 2021-09-06: 5 mg via INTRAVENOUS

## 2021-09-06 MED ORDER — FENTANYL CITRATE (PF) 100 MCG/2ML IJ SOLN
INTRAMUSCULAR | Status: AC
  Filled 2021-09-06: qty 2

## 2021-09-06 MED ORDER — AMOXICILLIN 875 MG PO TABS: 875.0000 mg | ORAL_TABLET | Freq: Two times a day (BID) | ORAL | 0 refills | Status: AC

## 2021-09-06 MED ORDER — FENTANYL CITRATE (PF) 100 MCG/2ML IJ SOLN: 25.0000 ug | INTRAMUSCULAR | Status: DC | PRN

## 2021-09-06 SURGICAL SUPPLY — 70 items
ADH SKN CLS APL DERMABOND .7 (GAUZE/BANDAGES/DRESSINGS)
APL SKNCLS STERI-STRIP NONHPOA (GAUZE/BANDAGES/DRESSINGS)
BENZOIN TINCTURE PRP APPL 2/3 (GAUZE/BANDAGES/DRESSINGS) IMPLANT
BLADE SURG 15 STRL LF DISP TIS (BLADE) ×1 IMPLANT
BLADE SURG 15 STRL SS (BLADE) ×2
CANISTER SUCT 1200ML W/VALVE (MISCELLANEOUS) IMPLANT
CLEANER CAUTERY TIP 5X5 PAD (MISCELLANEOUS) IMPLANT
CLIP TI MEDIUM 6 (CLIP) IMPLANT
CLIP TI WIDE RED SMALL 6 (CLIP) IMPLANT
CORD BIPOLAR FORCEPS 12FT (ELECTRODE) IMPLANT
COVER BACK TABLE 60X90IN (DRAPES) ×2 IMPLANT
COVER MAYO STAND STRL (DRAPES) ×2 IMPLANT
DECANTER SPIKE VIAL GLASS SM (MISCELLANEOUS) IMPLANT
DERMABOND ADVANCED (GAUZE/BANDAGES/DRESSINGS)
DERMABOND ADVANCED .7 DNX12 (GAUZE/BANDAGES/DRESSINGS) IMPLANT
DRAIN JACKSON RD 7FR 3/32 (WOUND CARE) IMPLANT
DRAIN PENROSE 12X.25 LTX STRL (MISCELLANEOUS) IMPLANT
DRAPE U-SHAPE 76X120 STRL (DRAPES) ×2 IMPLANT
ELECT COATED BLADE 2.86 ST (ELECTRODE) ×2 IMPLANT
ELECT NDL BLADE 2-5/6 (NEEDLE) IMPLANT
ELECT NEEDLE BLADE 2-5/6 (NEEDLE) IMPLANT
ELECT PAIRED SUBDERMAL (MISCELLANEOUS)
ELECT REM PT RETURN 9FT ADLT (ELECTROSURGICAL) ×2
ELECTRODE PAIRED SUBDERMAL (MISCELLANEOUS) IMPLANT
ELECTRODE REM PT RTRN 9FT ADLT (ELECTROSURGICAL) ×1 IMPLANT
EVACUATOR SILICONE 100CC (DRAIN) IMPLANT
FORCEPS BIPOLAR SPETZLER 8 1.0 (NEUROSURGERY SUPPLIES) IMPLANT
GAUZE 4X4 16PLY ~~LOC~~+RFID DBL (SPONGE) IMPLANT
GAUZE SPONGE 4X4 12PLY STRL LF (GAUZE/BANDAGES/DRESSINGS) IMPLANT
GLOVE SURG ENC MOIS LTX SZ7.5 (GLOVE) ×2 IMPLANT
GOWN STRL REUS W/ TWL LRG LVL3 (GOWN DISPOSABLE) ×2 IMPLANT
GOWN STRL REUS W/TWL LRG LVL3 (GOWN DISPOSABLE) ×4
HEMOSTAT SNOW SURGICEL 2X4 (HEMOSTASIS) ×1 IMPLANT
HEMOSTAT SURGICEL .5X2 ABSORB (HEMOSTASIS) IMPLANT
LOCATOR NERVE 3 VOLT (DISPOSABLE) IMPLANT
NDL HYPO 25X1 1.5 SAFETY (NEEDLE) ×1 IMPLANT
NDL HYPO 27GX1-1/4 (NEEDLE) IMPLANT
NEEDLE HYPO 25X1 1.5 SAFETY (NEEDLE) ×2 IMPLANT
NEEDLE HYPO 27GX1-1/4 (NEEDLE) IMPLANT
NS IRRIG 1000ML POUR BTL (IV SOLUTION) ×2 IMPLANT
PACK BASIN DAY SURGERY FS (CUSTOM PROCEDURE TRAY) ×2 IMPLANT
PAD CLEANER CAUTERY TIP 5X5 (MISCELLANEOUS)
PENCIL SMOKE EVACUATOR (MISCELLANEOUS) ×2 IMPLANT
PIN SAFETY STERILE (MISCELLANEOUS) IMPLANT
PROBE NERVBE PRASS .33 (MISCELLANEOUS) IMPLANT
SHEARS HARMONIC 9CM CVD (BLADE) ×1 IMPLANT
SLEEVE SCD COMPRESS KNEE MED (STOCKING) IMPLANT
SPONGE GAUZE 2X2 8PLY STRL LF (GAUZE/BANDAGES/DRESSINGS) IMPLANT
SPONGE INTESTINAL PEANUT (DISPOSABLE) IMPLANT
STAPLER VISISTAT 35W (STAPLE) IMPLANT
STRIP CLOSURE SKIN 1/4X4 (GAUZE/BANDAGES/DRESSINGS) IMPLANT
SUCTION FRAZIER HANDLE 10FR (MISCELLANEOUS)
SUCTION TUBE FRAZIER 10FR DISP (MISCELLANEOUS) IMPLANT
SUT ETHILON 3 0 PS 1 (SUTURE) IMPLANT
SUT ETHILON 4 0 PS 2 18 (SUTURE) IMPLANT
SUT PROLENE 5 0 P 3 (SUTURE) IMPLANT
SUT SILK 3 0 TIES 17X18 (SUTURE)
SUT SILK 3-0 18XBRD TIE BLK (SUTURE) IMPLANT
SUT SILK 4 0 TIES 17X18 (SUTURE) IMPLANT
SUT VIC AB 3-0 FS2 27 (SUTURE) IMPLANT
SUT VIC AB 4-0 P-3 18XBRD (SUTURE) IMPLANT
SUT VIC AB 4-0 P3 18 (SUTURE)
SUT VIC AB 4-0 RB1 27 (SUTURE)
SUT VIC AB 4-0 RB1 27X BRD (SUTURE) IMPLANT
SUT VICRYL 4-0 PS2 18IN ABS (SUTURE) ×2 IMPLANT
SYR BULB EAR ULCER 3OZ GRN STR (SYRINGE) IMPLANT
SYR CONTROL 10ML LL (SYRINGE) ×2 IMPLANT
TOWEL GREEN STERILE FF (TOWEL DISPOSABLE) ×2 IMPLANT
TRAY DSU PREP LF (CUSTOM PROCEDURE TRAY) ×2 IMPLANT
TUBE CONNECTING 20X1/4 (TUBING) IMPLANT

## 2021-09-06 NOTE — Discharge Instructions (Addendum)
°  Post Anesthesia Home Care Instructions  Activity: Get plenty of rest for the remainder of the day. A responsible individual must stay with you for 24 hours following the procedure.  For the next 24 hours, DO NOT: -Drive a car -Paediatric nurse -Drink alcoholic beverages -Take any medication unless instructed by your physician -Make any legal decisions or sign important papers.  Meals: Start with liquid foods such as gelatin or soup. Progress to regular foods as tolerated. Avoid greasy, spicy, heavy foods. If nausea and/or vomiting occur, drink only clear liquids until the nausea and/or vomiting subsides. Call your physician if vomiting continues.  Special Instructions/Symptoms: Your throat may feel dry or sore from the anesthesia or the breathing tube placed in your throat during surgery. If this causes discomfort, gargle with warm salt water. The discomfort should disappear within 24 hours.  If you had a scopolamine patch placed behind your ear for the management of post- operative nausea and/or vomiting:  1. The medication in the patch is effective for 72 hours, after which it should be removed.  Wrap patch in a tissue and discard in the trash. Wash hands thoroughly with soap and water. 2. You may remove the patch earlier than 72 hours if you experience unpleasant side effects which may include dry mouth, dizziness or visual disturbances. 3. Avoid touching the patch. Wash your hands with soap and water after contact with the patch.    ----------------  The patient is instructed not to lift anything more than 20 pounds for the next 3 days.  The patient may resume all her other activities and diet.  She may use Tylenol or ibuprofen as needed for pain.

## 2021-09-06 NOTE — Anesthesia Procedure Notes (Addendum)
Procedure Name: Intubation Date/Time: 09/06/2021 2:29 PM Performed by: Duane Boston, MD Pre-anesthesia Checklist: Patient identified, Emergency Drugs available, Suction available and Patient being monitored Patient Re-evaluated:Patient Re-evaluated prior to induction Oxygen Delivery Method: Circle system utilized Preoxygenation: Pre-oxygenation with 100% oxygen Induction Type: IV induction Ventilation: Mask ventilation without difficulty Laryngoscope Size: Glidescope and 3 Grade View: Grade IV Tube type: Oral Tube size: 7.5 mm Number of attempts: 2 Airway Equipment and Method: Video-laryngoscopy Placement Confirmation: ETT inserted through vocal cords under direct vision, positive ETCO2 and breath sounds checked- equal and bilateral Secured at: 21 cm Tube secured with: Tape Difficulty Due To: Difficulty was anticipated, Difficult Airway- due to anterior larynx, Difficult Airway- due to limited oral opening and Difficult Airway- due to reduced neck mobility Future Recommendations: Recommend- induction with short-acting agent, and alternative techniques readily available

## 2021-09-06 NOTE — Anesthesia Preprocedure Evaluation (Addendum)
Anesthesia Evaluation  Patient identified by MRN, date of birth, ID band Patient awake  General Assessment Comment:TONGUE MASS  Reviewed: Allergy & Precautions, NPO status , Patient's Chart, lab work & pertinent test results  History of Anesthesia Complications Negative for: history of anesthetic complications  Airway Mallampati: III  TM Distance: <3 FB Neck ROM: Full  Mouth opening: Limited Mouth Opening  Dental  (+) Poor Dentition, Dental Advisory Given   Pulmonary sleep apnea and Continuous Positive Airway Pressure Ventilation ,    Pulmonary exam normal        Cardiovascular hypertension, + Peripheral Vascular Disease  Normal cardiovascular exam     Neuro/Psych  Neuromuscular disease    GI/Hepatic Neg liver ROS, GERD  ,  Endo/Other  diabetes, Type 2, Oral Hypoglycemic Agents  Renal/GU negative Renal ROS     Musculoskeletal negative musculoskeletal ROS (+)   Abdominal   Peds  Hematology negative hematology ROS (+)   Anesthesia Other Findings Day of surgery medications reviewed with the patient.  Reproductive/Obstetrics                           Anesthesia Physical  Anesthesia Plan  ASA: 2  Anesthesia Plan: General   Post-op Pain Management: Tylenol PO (pre-op)   Induction: Intravenous  PONV Risk Score and Plan: 3 and Dexamethasone, Ondansetron and Treatment may vary due to age or medical condition  Airway Management Planned: Oral ETT and LMA  Additional Equipment:   Intra-op Plan:   Post-operative Plan: Extubation in OR  Informed Consent: I have reviewed the patients History and Physical, chart, labs and discussed the procedure including the risks, benefits and alternatives for the proposed anesthesia with the patient or authorized representative who has indicated his/her understanding and acceptance.     Dental advisory given  Plan Discussed with: CRNA and  Anesthesiologist  Anesthesia Plan Comments:        Anesthesia Quick Evaluation

## 2021-09-06 NOTE — Transfer of Care (Signed)
Immediate Anesthesia Transfer of Care Note  Patient: Grace [name]  Procedure(s) Performed: NECK MASS BIOPSY (Left: Neck)  Patient Location: PACU  Anesthesia Type:General  Level of Consciousness: awake, alert  and oriented  Airway & Oxygen Therapy: Patient Spontanous Breathing and Patient connected to face mask oxygen  Post-op Assessment: Report given to RN and Post -op Vital signs reviewed and stable  Post vital signs: Reviewed and stable  Last Vitals:  Vitals Value Taken Time  BP 157/89 09/06/21 1522  Temp    Pulse 80 09/06/21 1524  Resp 17 09/06/21 1524  SpO2 98 % 09/06/21 1524  Vitals shown include unvalidated device data.  Last Pain:  Vitals:   09/06/21 1324  TempSrc: Oral  PainSc: 0-No pain      Patients Stated Pain Goal: 4 (07/62/26 3335)  Complications: No notable events documented.

## 2021-09-06 NOTE — Op Note (Signed)
DATE OF PROCEDURE:  09/06/2021                              OPERATIVE REPORT  SURGEON:  Leta Baptist, MD  PREOPERATIVE DIAGNOSES: 1.  Left neck mass.  POSTOPERATIVE DIAGNOSES: 1.  Left neck mass  PROCEDURE PERFORMED: Open biopsy of deep left neck mass.  ANESTHESIA:  General endotracheal tube anesthesia.  COMPLICATIONS:  None.  ESTIMATED BLOOD LOSS:  Minimal.  INDICATION FOR PROCEDURE:  Grace [name] is a 71 y.o. female with a history of a 1 cm left base of tongue mass and a 2.8 cm left level 2 necrotic lymph node. Her base of tongue biopsy was consistent with chronic inflammation and fibrosis. No malignancy was noted within the biopsy specimens.  Fine-needle aspiration biopsy was subsequently performed by a radiologist.  The pathology was consistent with lymphoid tissue with fibrosis.  No obvious malignancy was noted.  More tissue sampling was recommended.  Based on the above findings, the decision was made for the patient to undergo the open biopsy procedure.  The risks, benefits, alternatives, and details of the procedure were discussed with the patient.  Questions were invited and answered.  Informed consent was obtained.  DESCRIPTION:  The patient was taken to the operating room and placed supine on the operating table.  General endotracheal tube anesthesia was administered by the anesthesiologist.  The patient was positioned and prepped and draped in a standard fashion for left neck surgery.  1% lidocaine with 1-100,000 epinephrine was injected at the planned site of incision.  A transverse incision was made over the left level 2 neck mass.  The incision was carried down past the level of the platysma muscles.  Superiorly based and inferiorly based subplatysmal flaps were elevated.  The mass was noted to be deep to the sternocleidomastoid muscles.  The muscle fibers overlying the mass was then separated and retracted, exposing the underlying neck mass.  3 large pieces of the left neck  mass was then excised and sent to the pathology department as fresh specimens for histologic identification.  A large amount of necrotic tissue and fluid was noted within the left neck mass.  The surgical site was copiously irrigated.  Hemostasis was achieved.  The incision was closed in layers with 4-0 Vicryl and Dermabond.  The care of the patient was turned over to the anesthesiologist.  The patient was awakened from anesthesia without difficulty.  The patient was extubated and transferred to the recovery room in good condition.  OPERATIVE FINDINGS: A 3 cm left level 2 neck mass.  SPECIMEN: Biopsy specimens from the left neck mass.  FOLLOWUP CARE:  The patient will be discharged home once awake and alert.  She will be placed on amoxicillin 875 mg p.o. b.i.d. for 3 days. The patient will follow up in my office in 1 week.  [name] W Jaydon Soroka 09/06/2021 3:30 PM

## 2021-09-06 NOTE — H&P (Signed)
°  C: Left neck mass, left tongue mass.  HPI: The patient is a 71 year old female who returns today for her follow-up evaluation.  The patient was previously seen for a 1 cm left base of tongue mass and a 2.8 cm left level 2 necrotic lymph node. Her base of tongue biopsy was consistent with chronic inflammation and fibrosis. No malignancy was noted within the biopsy specimens.  Fine-needle aspiration biopsy was subsequently performed by a radiologist.  The pathology was consistent with lymphoid tissue with fibrosis.  No obvious malignancy was noted.  More tissue sampling was recommended.  The patient returns today reporting no significant change in her neck mass.  She denies any dysphagia, odynophagia, dyspnea, or dysphonia. No other ENT, GI, or respiratory issue noted since the last visit.   Exam: General: Communicates without difficulty, well nourished, no acute distress. Head: Normocephalic, no evidence injury, no tenderness, facial buttresses intact without stepoff. Face/sinus: No tenderness to palpation and percussion. Facial movement is normal and symmetric. Eyes: PERRL, EOMI. No scleral icterus, conjunctivae clear. Neuro: CN II exam reveals vision grossly intact.  No nystagmus at any point of gaze. Ears: Auricles well formed without lesions.  Ear canals are intact without mass or lesion.  No erythema or edema is appreciated.  The TMs are intact without fluid. Nose: External evaluation reveals normal support and skin without lesions.  Dorsum is intact.  Anterior rhinoscopy reveals pink mucosa over anterior aspect of inferior turbinates and intact septum.  No purulence noted. Oral:  Oral cavity and oropharynx are intact, symmetric, without erythema or edema.  Mucosa is moist without visible lesions. Firmness is noted within the left base of tongue on palpation.  Neck: Full range of motion without pain.  Left level 2 mass. Thyroid bed within normal limits to palpation.  Parotid glands and submandibular  glands equal bilaterally without mass.  Trachea is midline. Neuro:  CN 2-12 grossly intact. Gait normal.   Assessment  1.  The patient has a suspicious 1 cm left base of tongue mass and a 2.8 cm left Level II necrotic lymph node. The findings were concerning for base of tongue squamous cell carcinoma and left neck metastases.   2.  However, biopsy specimens from her tongue base and left neck FNA were all negative. 3.  The patient is currently not symptomatic.  Plan note 1.  The physical exam findings and the pathology results are reviewed with the patient and her husband. 2.  Based on the above findings, the patient will benefit from undergoing open biopsy of the left neck mass.  The risk, benefits, alternatives, and details of the procedure are extensively discussed. 3.  The patient has multiple concerns and questions.  They were answered to the best of my knowledge. 4.  The patient would like to proceed with the open biopsy procedure.

## 2021-09-06 NOTE — Anesthesia Postprocedure Evaluation (Signed)
Anesthesia Post Note  Patient: Grace [name]  Procedure(s) Performed: NECK MASS BIOPSY (Left: Neck)     Patient location during evaluation: PACU Anesthesia Type: General Level of consciousness: sedated Pain management: pain level controlled Vital Signs Assessment: post-procedure vital signs reviewed and stable Respiratory status: spontaneous breathing and respiratory function stable Cardiovascular status: stable Postop Assessment: no apparent nausea or vomiting Anesthetic complications: no   No notable events documented.  Last Vitals:  Vitals:   09/06/21 1525 09/06/21 1530  BP: (!) 157/89 (!) 171/71  Pulse: 80 78  Resp: 17 16  Temp: (!) 36.3 C   SpO2: 99% 99%    Last Pain:  Vitals:   09/06/21 1525  TempSrc:   PainSc: 0-No pain                 Mashelle Busick DANIEL

## 2021-09-09 ENCOUNTER — Encounter (HOSPITAL_BASED_OUTPATIENT_CLINIC_OR_DEPARTMENT_OTHER): Payer: Self-pay | Admitting: Otolaryngology

## 2021-09-10 LAB — SURGICAL PATHOLOGY

## 2021-09-10 NOTE — Progress Notes (Signed)
MEDICARE ANNUAL WELLNESS VISIT AND FOLLOW UP  Assessment:    Diabetic polyneuropathy associated with diabetes mellitus due to underlying condition ([place]) Continue follow up  Type 2 diabetes mellitus with stage 2 chronic kidney disease, without long-term current use of insulin (HCC) Increase fluids, avoid NSAIDS, monitor sugars, will monitor Discussed trulicity to help lower A1c , pt will consider- is not ready to start now Encourage checking blood sugars daily  Type 2 diabetes mellitus with hyperlipidemia ([place]) STRONGLY suggest getting on low dose statin, patient declines at this time, will consider.  decrease fatty foods increase activity.   Current moderate episode of depression(HCC) Suggested therapy and possible adjustment of medication, currently on Cymbalta for pain- pt refuses at this time.  Will try to increase exercise Instructed patient to contact office or on-call physician promptly should condition worsen or any new symptoms appear. IF THE PATIENT HAS ANY SUICIDAL OR HOMICIDAL IDEATIONS, CALL THE OFFICE, DISCUSS WITH A SUPPORT MEMBER, OR GO TO THE ER IMMEDIATELY. Patient was agreeable with this plan.   Diabetes mellitus type 2 with peripheral artery disease ([place]) - suggest repeat of ABI, patient declines at this time.   Diabetes mellitus with macular edema ([place]) - continue follow up every 6 weeks.   Medication management Continued  Mixed hyperlipidemia -     Continue diet and exercise, continue Omega 3 - Check lipid panel  Essential hypertension - continue DASH diet, exercise - Check bp daily and keep a log, if BP is consistently over 130/80 recommend medication, she declines medication at this time, would like to monitor - Patient is instructed on the dangers of untreated elevated blood pressure, again declines medication -  CBC  Foot drop, bilateral Has leg braces and follows with ortho  Vitamin D deficiency Continue Vit D supplementation  Thyroid  nodule Discovered on CT of neck, U/S recommended U/S ordered   Fatty liver Check labs, avoid tylenol, alcohol, weight loss advised.   History of medication noncompliance Recommend blood pressure medication and addition of Trulicity for blood sugar Pt refuses further medication at this time Strongly encouraged to check blood sugars and blood pressures daily   Over 30 minutes of exam, counseling, chart review, and critical decision making was performed  Future Appointments  Date Time Provider [place]  09/24/2021  3:30 PM Rankin, Clent Demark, MD RDE-RDE None  10/01/2021  3:30 PM Rankin, Clent Demark, MD RDE-RDE None  01/22/2022 11:15 AM Suzzanne Cloud, NP GNA-GNA None  06/11/2022 10:00 AM Magda Bernheim, NP GAAM-GAAIM None  06/26/2022 11:00 AM Magda Bernheim, NP GAAM-GAAIM None       Subjective:   Grace [name] is a 71 y.o. female who presents for Medicare Annual Wellness Visit and 3 month follow up on hypertension, diabetes, hyperlipidemia, vitamin D def.   Since November she was dealing with  tongue lesion and enlarged lymph node in neck.  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  Did find a thyroid nodule and recommend an ultrasound.  BMI is Body mass index is 29.49 kg/m., she is working on diet and exercise. Wt Readings from Last 3 Encounters:  09/11/21 171 lb 12.8 oz (77.9 kg)  09/06/21 167 lb 15.9 oz (76.2 kg)  08/13/21 169 lb (76.7 kg)    Her blood pressure is not checked at home, today their BP is BP: (!) 144/80. Will check BP at home BP Readings from Last 3 Encounters:  09/11/21 (!) 144/80  09/06/21 (!) 175/64  08/13/21 (!) 158/61  She does not workout. She denies chest pain, shortness of breath, dizziness.   She does have a history of diabetes managed with medication, diet, and exercise.  With PAD had ABI in 2015 that showed mild disease, not on ASA due to [place] She has CKD She has neuropathy which is extensive and requires her to use a walker to  avoid falls, she is on cymbalta for neuropathy. She has braces on her legs to help prevent falls, no falls in past year but she is high risk.  With hyperlipidemia not at goal- long discussion about statins, will check today and see where we are at She has macular degeneration and edema following with Dr. [name], not on HCTZ due to this She has not checked her blood sugars Has accucheck she is on 4 metformin a day   1 amaryl a day with largest meal She has not been checking her blood sugars   Lab Results  Component Value Date   HGBA1C 7.5 (H) 06/10/2021   Last GFR Lab Results  Component Value Date   GFRNONAA >60 09/04/2021   Lab Results  Component Value Date   CHOL 172 06/10/2021   HDL 57 06/10/2021   LDLCALC 84 06/10/2021   TRIG 221 (H) 06/10/2021   CHOLHDL 3.0 06/10/2021    Patient is on Vitamin D supplement. Lab Results  Component Value Date   VD25OH 68 06/10/2021      Medication Review  Current Outpatient Medications (Endocrine & Metabolic):    glimepiride (AMARYL) 2 MG tablet, Take  1 tablet  2 x /day  with Meals  for Diabetes (Patient taking differently: Take 2 mg by mouth daily.)   metFORMIN (GLUCOPHAGE-XR) 500 MG 24 hr tablet, TAKE 2 TABLETS TWICE DAILY FOR DIABETES    Current Outpatient Medications (Analgesics):    acetaminophen (TYLENOL) 500 MG tablet, Take 1,500 mg by mouth every 8 (eight) hours as needed for moderate pain.   Current Outpatient Medications (Other):    Calcium 200 MG TABS, Take 400 mg by mouth daily.   Cholecalciferol (DIALYVITE VITAMIN D 5000) 125 MCG (5000 UT) capsule, Take 5,000 Units by mouth daily.   DULoxetine (CYMBALTA) 30 MG capsule, Take 1 capsule (30 mg total) by mouth daily.   Magnesium 250 MG TABS, Take 500 mg by mouth daily.   Omega-3 350 MG CPDR, Take 350 mg by mouth 2 (two) times daily.   zinc gluconate 50 MG tablet, Take 50 mg by mouth daily.   glucose blood (ACCU-CHEK GUIDE) test strip, Check blood sugar 3 times a  day-DX-E11.69 AND E78.5.   vitamin C (ASCORBIC ACID) 500 MG tablet, Take 500 mg by mouth daily. (Patient not taking: Reported on 09/11/2021)  Allergies: Allergies  Allergen Reactions   Gabapentin Swelling    Swelling in legs and feet    Current Problems (verified) has Diabetic polyneuropathy ([place]); Foot drop, bilateral; T2_NIDDM; Mixed hyperlipidemia; Vitamin D deficiency; Medication management; Essential hypertension; Fatty liver; Cholelithiases; Hyperlipidemia associated with type 2 diabetes mellitus ([place]); Diabetes mellitus type 2 with peripheral artery disease ([place]); Diabetic macular edema of right eye with proliferative retinopathy associated with type 2 diabetes mellitus ([place]); Proliferative diabetic retinopathy of left eye with macular edema associated with type 2 diabetes mellitus ([place]); Right epiretinal membrane; Non-restorative sleep; Snoring; RLS (restless legs syndrome); OSA on CPAP; CKD stage 3 due to type 2 diabetes mellitus ([place]); and Osteoporosis on their problem list.  Screening Tests Immunization History  Administered Date(s) Administered   Influenza, High Dose  Seasonal PF 05/12/2016, 07/03/2017, 05/24/2018, 06/10/2019, 05/02/2021   Influenza-Unspecified 07/03/2017   PFIZER(Purple Top)SARS-COV-2 Vaccination 09/22/2019, 10/13/2019, 06/15/2020   Pneumococcal Conjugate-13 02/07/2016   Pneumococcal Polysaccharide-23 09/03/2017   Td 02/07/2016     Names of Other Physician/Practitioners you currently use: 1. [facility]- here for primary care 2. Dr. Nicki Reaper [place], eye doctor, last visit 05/2021 Dr. Zenaida Niece, dentist, last visit 12/2020 Patient Care Team: Unk Pinto, MD as PCP - General (Internal Medicine) [name] Clent Demark, MD as Consulting Physician (Ophthalmology) Kathrynn Ducking, MD (Inactive) as Consulting Physician (Neurology) Monna Fam, MD as Consulting Physician (Ophthalmology) Earnstine Regal, PA-C as Physician Assistant  (Obstetrics and Gynecology)  Surgical: She  has a past surgical history that includes Tonsillectomy; Abdominal hysterectomy; Hip surgery; Ankle fracture surgery; Cataract extraction, bilateral; Cataract extraction w/PHACO (Right, 02/21/2014); Cataract extraction w/PHACO (Left, 04/10/2014); Direct laryngoscopy (Left, 08/02/2021); and Mass biopsy (Left, 09/06/2021). Family Her family history includes Diabetes in her brother and mother. Social history  She reports that she has never smoked. She has never used smokeless tobacco. She reports that she does not drink alcohol and does not use drugs.  Review of Systems  Constitutional:  Negative for chills, fever and weight loss.  HENT:  Negative for congestion and hearing loss.   Eyes:  Negative for blurred vision and double vision.  Respiratory:  Negative for cough and shortness of breath.   Cardiovascular:  Negative for chest pain, palpitations, orthopnea and leg swelling.  Gastrointestinal:  Negative for abdominal pain, constipation, diarrhea, heartburn, nausea and vomiting.  Musculoskeletal:  Negative for falls, joint pain and myalgias.  Skin:  Negative for rash.  Neurological:  Negative for dizziness, tingling, tremors, loss of consciousness and headaches.  Psychiatric/Behavioral:  Negative for depression, memory loss and suicidal ideas.       Objective:   Today's Vitals   09/11/21 1013  BP: (!) 144/80  Pulse: 75  Temp: (!) 97.3 F (36.3 C)  SpO2: 98%  Weight: 171 lb 12.8 oz (77.9 kg)     Body mass index is 29.49 kg/m.  General Appearance: Well nourished, in no apparent distress.  Eyes: PERRLA, EOMs, conjunctiva no swelling or erythema, normal fundi and vessels.  Sinuses: No Frontal/maxillary tenderness  ENT/Mouth: Ext aud canals clear, normal light reflex with TMs without erythema, bulging. Good dentition. No erythema, swelling, or exudate on post pharynx. Tonsils not swollen or erythematous. Hearing normal.  Neck: Supple,  thyroid normal. No bruits Swelling left neck, possible lymph node Respiratory: Respiratory effort normal, BS equal bilaterally without rales, rhonchi, wheezing or stridor.  Cardio: RRR without murmurs, rubs or gallops. DP and PT slighlty diminished Chest: symmetric, with normal excursions and percussion.  Breasts: Defer to GYN Abdomen: Positive bowel sounds all 4 quadrants, Soft, nontender, no guarding, rebound, hernias, masses, or organomegaly.  Lymphatics: Possible enlarged lymph node left cervical chain, nontender Genitourinary:Defer Musculoskeletal: Full ROM all peripheral extremities,5/5 strength, and normal gait.  Skin: Feet purple discoloration and cool to touch Neuro: Cranial nerves intact, reflexes equal bilaterally. Normal muscle tone, no cerebellar symptoms. Sensation intact with monofilament, PT and DP pulses 2+. Foot drop bilaterally Psych: Awake and oriented X 3, normal affect, Insight and Judgment appropriate.       Magda Bernheim, NP   09/11/2021

## 2021-09-11 ENCOUNTER — Ambulatory Visit (INDEPENDENT_AMBULATORY_CARE_PROVIDER_SITE_OTHER): Payer: Medicare Other | Admitting: Nurse Practitioner

## 2021-09-11 ENCOUNTER — Other Ambulatory Visit: Payer: Self-pay

## 2021-09-11 ENCOUNTER — Encounter: Payer: Self-pay | Admitting: Nurse Practitioner

## 2021-09-11 VITALS — BP 144/80 | HR 75 | Temp 97.3°F | Wt 171.8 lb

## 2021-09-11 DIAGNOSIS — E1169 Type 2 diabetes mellitus with other specified complication: Secondary | ICD-10-CM | POA: Diagnosis not present

## 2021-09-11 DIAGNOSIS — E0842 Diabetes mellitus due to underlying condition with diabetic polyneuropathy: Secondary | ICD-10-CM

## 2021-09-11 DIAGNOSIS — F321 Major depressive disorder, single episode, moderate: Secondary | ICD-10-CM

## 2021-09-11 DIAGNOSIS — K760 Fatty (change of) liver, not elsewhere classified: Secondary | ICD-10-CM

## 2021-09-11 DIAGNOSIS — E08311 Diabetes mellitus due to underlying condition with unspecified diabetic retinopathy with macular edema: Secondary | ICD-10-CM

## 2021-09-11 DIAGNOSIS — E1151 Type 2 diabetes mellitus with diabetic peripheral angiopathy without gangrene: Secondary | ICD-10-CM | POA: Diagnosis not present

## 2021-09-11 DIAGNOSIS — N182 Chronic kidney disease, stage 2 (mild): Secondary | ICD-10-CM | POA: Diagnosis not present

## 2021-09-11 DIAGNOSIS — I10 Essential (primary) hypertension: Secondary | ICD-10-CM | POA: Diagnosis not present

## 2021-09-11 DIAGNOSIS — M21372 Foot drop, left foot: Secondary | ICD-10-CM

## 2021-09-11 DIAGNOSIS — E041 Nontoxic single thyroid nodule: Secondary | ICD-10-CM

## 2021-09-11 DIAGNOSIS — M21371 Foot drop, right foot: Secondary | ICD-10-CM | POA: Diagnosis not present

## 2021-09-11 DIAGNOSIS — E1122 Type 2 diabetes mellitus with diabetic chronic kidney disease: Secondary | ICD-10-CM

## 2021-09-11 DIAGNOSIS — Z9114 Patient's other noncompliance with medication regimen: Secondary | ICD-10-CM

## 2021-09-11 DIAGNOSIS — Z79899 Other long term (current) drug therapy: Secondary | ICD-10-CM | POA: Diagnosis not present

## 2021-09-11 DIAGNOSIS — E559 Vitamin D deficiency, unspecified: Secondary | ICD-10-CM | POA: Diagnosis not present

## 2021-09-11 DIAGNOSIS — E11311 Type 2 diabetes mellitus with unspecified diabetic retinopathy with macular edema: Secondary | ICD-10-CM | POA: Diagnosis not present

## 2021-09-11 DIAGNOSIS — E785 Hyperlipidemia, unspecified: Secondary | ICD-10-CM

## 2021-09-11 NOTE — Patient Instructions (Signed)

## 2021-09-12 LAB — CBC WITH DIFFERENTIAL/PLATELET
Absolute Monocytes: 570 cells/uL (ref 200–950)
Basophils Absolute: 46 cells/uL (ref 0–200)
Basophils Relative: 0.6 %
Eosinophils Absolute: 293 cells/uL (ref 15–500)
Eosinophils Relative: 3.8 %
HCT: 35.5 % (ref 35.0–45.0)
Hemoglobin: 11.8 g/dL (ref 11.7–15.5)
Lymphs Abs: 2118 cells/uL (ref 850–3900)
MCH: 31.7 pg (ref 27.0–33.0)
MCHC: 33.2 g/dL (ref 32.0–36.0)
MCV: 95.4 fL (ref 80.0–100.0)
MPV: 9.3 fL (ref 7.5–12.5)
Monocytes Relative: 7.4 %
Neutro Abs: 4674 cells/uL (ref 1500–7800)
Neutrophils Relative %: 60.7 %
Platelets: 405 10*3/uL — ABNORMAL HIGH (ref 140–400)
RBC: 3.72 10*6/uL — ABNORMAL LOW (ref 3.80–5.10)
RDW: 12.3 % (ref 11.0–15.0)
Total Lymphocyte: 27.5 %
WBC: 7.7 10*3/uL (ref 3.8–10.8)

## 2021-09-12 LAB — COMPLETE METABOLIC PANEL WITH GFR
AG Ratio: 1.7 (calc) (ref 1.0–2.5)
ALT: 15 U/L (ref 6–29)
AST: 15 U/L (ref 10–35)
Albumin: 3.8 g/dL (ref 3.6–5.1)
Alkaline phosphatase (APISO): 57 U/L (ref 37–153)
BUN: 23 mg/dL (ref 7–25)
CO2: 35 mmol/L — ABNORMAL HIGH (ref 20–32)
Calcium: 10.1 mg/dL (ref 8.6–10.4)
Chloride: 100 mmol/L (ref 98–110)
Creat: 0.94 mg/dL (ref 0.60–1.00)
Globulin: 2.3 g/dL (calc) (ref 1.9–3.7)
Glucose, Bld: 123 mg/dL — ABNORMAL HIGH (ref 65–99)
Potassium: 5.2 mmol/L (ref 3.5–5.3)
Sodium: 140 mmol/L (ref 135–146)
Total Bilirubin: 0.2 mg/dL (ref 0.2–1.2)
Total Protein: 6.1 g/dL (ref 6.1–8.1)
eGFR: 65 mL/min/{1.73_m2} (ref >=60)

## 2021-09-12 LAB — LIPID PANEL
Cholesterol: 168 mg/dL (ref <200)
HDL: 55 mg/dL (ref >=50)
LDL Cholesterol (Calc): 84 mg/dL (calc)
Non-HDL Cholesterol (Calc): 113 mg/dL (calc) (ref <130)
Total CHOL/HDL Ratio: 3.1 (calc) (ref <5.0)
Triglycerides: 191 mg/dL — ABNORMAL HIGH (ref <150)

## 2021-09-12 LAB — HEMOGLOBIN A1C
Hgb A1c MFr Bld: 7.9 % of total Hgb — ABNORMAL HIGH (ref <5.7)
Mean Plasma Glucose: 180 mg/dL
eAG (mmol/L): 10 mmol/L

## 2021-09-16 ENCOUNTER — Other Ambulatory Visit: Payer: Self-pay | Admitting: Nurse Practitioner

## 2021-09-18 ENCOUNTER — Ambulatory Visit
Admission: RE | Admit: 2021-09-18 | Discharge: 2021-09-18 | Disposition: A | Discharge status: Home / Self Care | Payer: Medicare Other | Source: Ambulatory Visit | Attending: Nurse Practitioner | Admitting: Nurse Practitioner

## 2021-09-18 DIAGNOSIS — E041 Nontoxic single thyroid nodule: Secondary | ICD-10-CM | POA: Diagnosis not present

## 2021-09-18 DIAGNOSIS — E042 Nontoxic multinodular goiter: Secondary | ICD-10-CM | POA: Diagnosis not present

## 2021-09-24 ENCOUNTER — Other Ambulatory Visit: Payer: Self-pay

## 2021-09-24 ENCOUNTER — Encounter (INDEPENDENT_AMBULATORY_CARE_PROVIDER_SITE_OTHER): Payer: Self-pay | Admitting: Ophthalmology

## 2021-09-24 ENCOUNTER — Ambulatory Visit (INDEPENDENT_AMBULATORY_CARE_PROVIDER_SITE_OTHER): Payer: Medicare Other | Admitting: Ophthalmology

## 2021-09-24 DIAGNOSIS — G4733 Obstructive sleep apnea (adult) (pediatric): Secondary | ICD-10-CM

## 2021-09-24 DIAGNOSIS — Z9989 Dependence on other enabling machines and devices: Secondary | ICD-10-CM

## 2021-09-24 DIAGNOSIS — E113511 Type 2 diabetes mellitus with proliferative diabetic retinopathy with macular edema, right eye: Secondary | ICD-10-CM

## 2021-09-24 DIAGNOSIS — E113512 Type 2 diabetes mellitus with proliferative diabetic retinopathy with macular edema, left eye: Secondary | ICD-10-CM | POA: Diagnosis not present

## 2021-09-24 DIAGNOSIS — H35371 Puckering of macula, right eye: Secondary | ICD-10-CM | POA: Diagnosis not present

## 2021-09-24 MED ORDER — AFLIBERCEPT 2MG/0.05ML IZ SOLN FOR KALEIDOSCOPE
2.0000 mg | INTRAVITREAL | Status: AC | PRN
  Administered 2021-09-24: 2 mg via INTRAVITREAL

## 2021-09-24 NOTE — Assessment & Plan Note (Signed)
OD today at 4 weeks post injection into vegF, still no CME looks great follow-up as scheduled

## 2021-09-24 NOTE — Progress Notes (Signed)
09/24/2021     CHIEF COMPLAINT Patient presents for  Chief Complaint  Patient presents with   Retina Follow Up      HISTORY OF PRESENT ILLNESS: Grace [name] is a 71 y.o. female who presents to the clinic today for:   HPI     Retina Follow Up           Diagnosis: Diabetic Retinopathy   Laterality: left eye   Onset: 7 weeks ago   Severity: mild   Duration: 7 weeks   Course: stable         Comments   7 week fu OS and Eylea OS and OCT  Pt states VA OU stable since last visit. Pt denies FOL, floaters, or ocular pain OU.        Last edited by Kendra Opitz, COA on 09/24/2021  3:27 PM.      Referring physician: Unk Pinto, MD [address] [place],  [place] [address]  HISTORICAL INFORMATION:   Selected notes from the MEDICAL RECORD NUMBER    Lab Results  Component Value Date   HGBA1C 7.9 (H) 09/11/2021     CURRENT MEDICATIONS: No current outpatient medications on file. (Ophthalmic Drugs)   No current facility-administered medications for this visit. (Ophthalmic Drugs)   Current Outpatient Medications (Other)  Medication Sig   acetaminophen (TYLENOL) 500 MG tablet Take 1,500 mg by mouth every 8 (eight) hours as needed for moderate pain.   Calcium 200 MG TABS Take 400 mg by mouth daily.   Cholecalciferol (DIALYVITE VITAMIN D 5000) 125 MCG (5000 UT) capsule Take 5,000 Units by mouth daily.   DULoxetine (CYMBALTA) 30 MG capsule Take 1 capsule (30 mg total) by mouth daily.   glimepiride (AMARYL) 2 MG tablet Take  1 tablet  2 x /day  with Meals  for Diabetes (Patient taking differently: Take 2 mg by mouth daily.)   glucose blood (ACCU-CHEK GUIDE) test strip Check blood sugar 3 times a day-DX-E11.69 AND E78.5.   Magnesium 250 MG TABS Take 500 mg by mouth daily.   metFORMIN (GLUCOPHAGE-XR) 500 MG 24 hr tablet TAKE 2 TABLETS TWICE DAILY FOR DIABETES   Omega-3 350 MG CPDR Take 350 mg by mouth 2 (two) times daily.   vitamin C  (ASCORBIC ACID) 500 MG tablet Take 500 mg by mouth daily. (Patient not taking: Reported on 09/11/2021)   zinc gluconate 50 MG tablet Take 50 mg by mouth daily.   No current facility-administered medications for this visit. (Other)      REVIEW OF SYSTEMS:    ALLERGIES Allergies  Allergen Reactions   Gabapentin Swelling    Swelling in legs and feet    PAST MEDICAL HISTORY Past Medical History:  Diagnosis Date   Abnormality of gait 07/28/2013   Chronic kidney disease    Diabetes ([place])    Diabetic peripheral neuropathy (HCC)    Diabetic retinopathy ([place])    Difficult intubation    Foot drop, bilateral 07/31/2014   GERD (gastroesophageal reflux disease)    Peripheral edema    Polyneuropathy in diabetes(357.2) 07/28/2013   Sleep apnea    uses cpap   Tongue mass    Past Surgical History:  Procedure Laterality Date   ABDOMINAL HYSTERECTOMY     ANKLE FRACTURE SURGERY     left   CATARACT EXTRACTION W/PHACO Right 02/21/2014   Dr. Herbert Deaner   CATARACT EXTRACTION W/PHACO Left 04/10/2014   Dr. Herbert Deaner   CATARACT EXTRACTION, BILATERAL  DIRECT LARYNGOSCOPY Left 08/02/2021   Procedure: DIRECT LARYNGOSCOPY WITH TONGUE BIOPSY;  Surgeon: Leta Baptist, MD;  Location: [facility];  Service: ENT;  Laterality: Left;   HIP SURGERY     right   MASS BIOPSY Left 09/06/2021   Procedure: NECK MASS BIOPSY;  Surgeon: Leta Baptist, MD;  Location: [facility];  Service: ENT;  Laterality: Left;   TONSILLECTOMY      FAMILY HISTORY Family History  Problem Relation Age of Onset   Diabetes Mother    Diabetes Brother    Neuropathy Neg Hx     SOCIAL HISTORY Social History   Tobacco Use   Smoking status: Never   Smokeless tobacco: Never  Substance Use Topics   Alcohol use: No   Drug use: No         OPHTHALMIC EXAM:  Base Eye Exam     Visual Acuity (ETDRS)       Right Left   Dist [place] 20/30 -1 20/80 -1   Dist ph [place] NI 20/60         Tonometry (Tonopen,  3:30 PM)       Right Left   Pressure 17 19         Pupils       Pupils React APD   Right PERRL Brisk None   Left PERRL Brisk None         Visual Fields (Counting fingers)       Left Right    Full Full         Extraocular Movement       Right Left    Full, Ortho Full, Ortho         Neuro/Psych     Oriented x3: Yes   Mood/Affect: Normal         Dilation     Left eye: 1.0% Mydriacyl, 2.5% Phenylephrine @ 3:30 PM           Slit Lamp and Fundus Exam     External Exam       Right Left   External Normal Normal         Slit Lamp Exam       Right Left   Lids/Lashes Normal Normal   Conjunctiva/Sclera White and quiet White and quiet   Cornea Clear Clear   Anterior Chamber Deep and quiet Deep and quiet   Iris Round and reactive Round and reactive   Lens Posterior chamber intraocular lens Posterior chamber intraocular lens   Anterior Vitreous Normal Normal         Fundus Exam       Right Left   Posterior Vitreous  Posterior vitreous detachment   Disc  Normal   C/D Ratio  0.25   Macula  Microaneurysms, Macular thickening, Mild clinically significant macular edema   Vessels  Severe nonproliferative diabetic retinopathy with retinal nonperfusion   Periphery  Good peripheral PRP            IMAGING AND PROCEDURES  Imaging and Procedures for 09/24/21  OCT, Retina - OU - Both Eyes       Right Eye Quality was good. Scan locations included subfoveal. Central Foveal Thickness: 301. Findings include abnormal foveal contour, epiretinal membrane.   Left Eye Quality was good. Scan locations included subfoveal. Central Foveal Thickness: 391. Progression has been stable. Findings include abnormal foveal contour, cystoid macular edema.   Notes OS right increase CSME at  7 week interval.  Laurey Arrow injection Eylea  OS today   OD at 4 weeks post most recent injection looks great, with no recurrence of macular edema OD     Intravitreal Injection,  Pharmacologic Agent - OS - Left Eye       Time Out 09/24/2021. 3:42 PM. Confirmed correct patient, procedure, site, and patient consented.   Anesthesia Topical anesthesia was used. Anesthetic medications included Lidocaine 4%.   Procedure Preparation included 5% betadine to ocular surface, 10% betadine to eyelids, Tobramycin 0.3%. A 30 gauge needle was used.   Injection: 2 mg aflibercept 2 MG/0.05ML   Route: Intravitreal, Site: Left Eye   NDC: [id number], Lot: [id number], Waste: 0 mL   Post-op Post injection exam found visual acuity of at least counting fingers. The patient tolerated the procedure well. There were no complications. The patient received written and verbal post procedure care education. Post injection medications included ocuflox.              ASSESSMENT/PLAN:  OSA on CPAP Continue compliance on CPAP  Right epiretinal membrane Minor no impact on acuity OD  Diabetic macular edema of right eye with proliferative retinopathy associated with type 2 diabetes mellitus ([place]) OD today at 4 weeks post injection into vegF, still no CME looks great follow-up as scheduled  Proliferative diabetic retinopathy of left eye with macular edema associated with type 2 diabetes mellitus ([place]) OS today chronic CSME, center involved, recurrent at 7 weeks slightly, no impact on acuity.  Repeat injection today and follow-up again in 6 to 7 weeks     ICD-10-CM   1. Proliferative diabetic retinopathy of left eye with macular edema associated with type 2 diabetes mellitus (HCC)  B76.2831 OCT, Retina - OU - Both Eyes    Intravitreal Injection, Pharmacologic Agent - OS - Left Eye    aflibercept (EYLEA) SOLN 2 mg    2. OSA on CPAP  G47.33    Z99.89     3. Right epiretinal membrane  H35.371     4. Diabetic macular edema of right eye with proliferative retinopathy associated with type 2 diabetes mellitus ([place])  E11.3511       1.  OS with chronic active recurrent CSME.   Nonetheless stable acuity currently at 7-week interval.  We will remain in 6 to 7-week follow-up interval left eye to keep the CSME from triggering loss of vision  2.  OD today at 4 weeks follow-up, follow-up as scheduled for reevaluation and possible antivegF at scheduled appointment time  3.  Ophthalmic Meds Ordered this visit:  Meds ordered this encounter  Medications   aflibercept (EYLEA) SOLN 2 mg       Return in about 6 weeks (around 11/05/2021) for dilate, OS, EYLEA OCT,, and OD as scheduled.  There are no Patient Instructions on file for this visit.   Explained the diagnoses, plan, and follow up with the patient and they expressed understanding.  Patient expressed understanding of the importance of proper follow up care.   Clent Demark Joangel Vanosdol M.D. Diseases & Surgery of the Retina and Vitreous Retina & Diabetic [place] 09/24/21     Abbreviations: M myopia (nearsighted); A astigmatism; H hyperopia (farsighted); P presbyopia; Mrx spectacle prescription;  CTL contact lenses; OD right eye; OS left eye; OU both eyes  XT exotropia; ET esotropia; PEK punctate epithelial keratitis; PEE punctate epithelial erosions; DES dry eye syndrome; MGD meibomian gland dysfunction; ATs artificial tears; PFAT's preservative free artificial tears; [place] nuclear sclerotic cataract; PSC posterior subcapsular cataract; ERM epi-retinal membrane; PVD  posterior vitreous detachment; RD retinal detachment; DM diabetes mellitus; DR diabetic retinopathy; NPDR non-proliferative diabetic retinopathy; PDR proliferative diabetic retinopathy; CSME clinically significant macular edema; DME diabetic macular edema; dbh dot blot hemorrhages; CWS cotton wool spot; POAG primary open angle glaucoma; C/D cup-to-disc ratio; HVF humphrey visual field; GVF goldmann visual field; OCT optical coherence tomography; IOP intraocular pressure; BRVO Branch retinal vein occlusion; CRVO central retinal vein occlusion; CRAO central retinal  artery occlusion; BRAO branch retinal artery occlusion; RT retinal tear; SB scleral buckle; PPV pars plana vitrectomy; VH Vitreous hemorrhage; PRP panretinal laser photocoagulation; IVK intravitreal kenalog; VMT vitreomacular traction; MH Macular hole;  NVD neovascularization of the disc; NVE neovascularization elsewhere; AREDS age related eye disease study; ARMD age related macular degeneration; POAG primary open angle glaucoma; EBMD epithelial/anterior basement membrane dystrophy; ACIOL anterior chamber intraocular lens; IOL intraocular lens; PCIOL posterior chamber intraocular lens; Phaco/IOL phacoemulsification with intraocular lens placement; [place] photorefractive keratectomy; LASIK laser assisted in situ keratomileusis; HTN hypertension; DM diabetes mellitus; COPD chronic obstructive pulmonary disease

## 2021-09-24 NOTE — Assessment & Plan Note (Signed)
OS today chronic CSME, center involved, recurrent at 7 weeks slightly, no impact on acuity.  Repeat injection today and follow-up again in 6 to 7 weeks

## 2021-09-24 NOTE — Assessment & Plan Note (Signed)
Continue compliance on CPAP

## 2021-09-24 NOTE — Assessment & Plan Note (Signed)
Minor no impact on acuity OD

## 2021-10-01 ENCOUNTER — Encounter (INDEPENDENT_AMBULATORY_CARE_PROVIDER_SITE_OTHER): Payer: Self-pay | Admitting: Ophthalmology

## 2021-10-01 ENCOUNTER — Ambulatory Visit (INDEPENDENT_AMBULATORY_CARE_PROVIDER_SITE_OTHER): Payer: Medicare Other | Admitting: Ophthalmology

## 2021-10-01 ENCOUNTER — Other Ambulatory Visit: Payer: Self-pay

## 2021-10-01 DIAGNOSIS — E113512 Type 2 diabetes mellitus with proliferative diabetic retinopathy with macular edema, left eye: Secondary | ICD-10-CM | POA: Diagnosis not present

## 2021-10-01 DIAGNOSIS — H18543 Lattice corneal dystrophy, bilateral: Secondary | ICD-10-CM | POA: Diagnosis not present

## 2021-10-01 DIAGNOSIS — E113511 Type 2 diabetes mellitus with proliferative diabetic retinopathy with macular edema, right eye: Secondary | ICD-10-CM | POA: Diagnosis not present

## 2021-10-01 DIAGNOSIS — H35371 Puckering of macula, right eye: Secondary | ICD-10-CM

## 2021-10-01 MED ORDER — AFLIBERCEPT 2MG/0.05ML IZ SOLN FOR KALEIDOSCOPE
2.0000 mg | INTRAVITREAL | Status: AC | PRN
  Administered 2021-10-01: 2 mg via INTRAVITREAL

## 2021-10-01 NOTE — Progress Notes (Addendum)
10/01/2021     CHIEF COMPLAINT Patient presents for  Chief Complaint  Patient presents with   Diabetic Retinopathy with Macular Edema      HISTORY OF PRESENT ILLNESS: Grace [name] is a 71 y.o. female who presents to the clinic today for:   HPI   5 weeks dilate OD, Eylea OD, OCT. Patient states vision is stable and unchanged since last visit. Denies any new floaters or FOL.  Last edited by Laurin Coder on 10/01/2021  3:35 PM.      Referring physician: Monna Fam, MD [place],  [place] [address]  HISTORICAL INFORMATION:   Selected notes from the MEDICAL RECORD NUMBER    Lab Results  Component Value Date   HGBA1C 7.9 (H) 09/11/2021     CURRENT MEDICATIONS: No current outpatient medications on file. (Ophthalmic Drugs)   No current facility-administered medications for this visit. (Ophthalmic Drugs)   Current Outpatient Medications (Other)  Medication Sig   acetaminophen (TYLENOL) 500 MG tablet Take 1,500 mg by mouth every 8 (eight) hours as needed for moderate pain.   Calcium 200 MG TABS Take 400 mg by mouth daily.   Cholecalciferol (DIALYVITE VITAMIN D 5000) 125 MCG (5000 UT) capsule Take 5,000 Units by mouth daily.   DULoxetine (CYMBALTA) 30 MG capsule Take 1 capsule (30 mg total) by mouth daily.   glimepiride (AMARYL) 2 MG tablet Take  1 tablet  2 x /day  with Meals  for Diabetes (Patient taking differently: Take 2 mg by mouth daily.)   glucose blood (ACCU-CHEK GUIDE) test strip Check blood sugar 3 times a day-DX-E11.69 AND E78.5.   Magnesium 250 MG TABS Take 500 mg by mouth daily.   metFORMIN (GLUCOPHAGE-XR) 500 MG 24 hr tablet TAKE 2 TABLETS TWICE DAILY FOR DIABETES   Omega-3 350 MG CPDR Take 350 mg by mouth 2 (two) times daily.   vitamin C (ASCORBIC ACID) 500 MG tablet Take 500 mg by mouth daily. (Patient not taking: Reported on 09/11/2021)   zinc gluconate 50 MG tablet Take 50 mg by mouth daily.   No current  facility-administered medications for this visit. (Other)      REVIEW OF SYSTEMS: ROS   Negative for: Constitutional, Gastrointestinal, Neurological, Skin, Genitourinary, Musculoskeletal, HENT, Endocrine, Cardiovascular, Eyes, Respiratory, Psychiatric, Allergic/Imm, Heme/Lymph Last edited by Hurman Horn, MD on 10/01/2021  3:50 PM.       ALLERGIES Allergies  Allergen Reactions   Gabapentin Swelling    Swelling in legs and feet    PAST MEDICAL HISTORY Past Medical History:  Diagnosis Date   Abnormality of gait 07/28/2013   Chronic kidney disease    Diabetes ([place])    Diabetic peripheral neuropathy (HCC)    Diabetic retinopathy ([place])    Difficult intubation    Foot drop, bilateral 07/31/2014   GERD (gastroesophageal reflux disease)    Peripheral edema    Polyneuropathy in diabetes(357.2) 07/28/2013   Sleep apnea    uses cpap   Tongue mass    Past Surgical History:  Procedure Laterality Date   ABDOMINAL HYSTERECTOMY     ANKLE FRACTURE SURGERY     left   CATARACT EXTRACTION W/PHACO Right 02/21/2014   Dr. [facility]   CATARACT EXTRACTION W/PHACO Left 04/10/2014   Dr. [facility]   CATARACT EXTRACTION, BILATERAL     DIRECT LARYNGOSCOPY Left 08/02/2021   Procedure: DIRECT LARYNGOSCOPY WITH TONGUE BIOPSY;  Surgeon: Leta Baptist, MD;  Location: [facility];  Service: ENT;  Laterality: Left;   HIP SURGERY     right   MASS BIOPSY Left 09/06/2021   Procedure: NECK MASS BIOPSY;  Surgeon: Leta Baptist, MD;  Location: [facility];  Service: ENT;  Laterality: Left;   TONSILLECTOMY      FAMILY HISTORY Family History  Problem Relation Age of Onset   Diabetes Mother    Diabetes Brother    Neuropathy Neg Hx     SOCIAL HISTORY Social History   Tobacco Use   Smoking status: Never   Smokeless tobacco: Never  Substance Use Topics   Alcohol use: No   Drug use: No         OPHTHALMIC EXAM:  Base Eye Exam     Visual Acuity (ETDRS)       Right Left    Dist [place] 20/50 -2 20/60   Dist ph [place] 20/40 -1 NI         Tonometry (Tonopen, 3:40 PM)       Right Left   Pressure 18 16         Pupils       Pupils Dark Light React APD   Right PERRL 3 3 Minimal None   Left PERRL 3 3 Minimal None         Extraocular Movement       Right Left    Full Full         Neuro/Psych     Oriented x3: Yes   Mood/Affect: Normal         Dilation     Right eye: 1.0% Mydriacyl, 2.5% Phenylephrine @ 3:40 PM           Slit Lamp and Fundus Exam     External Exam       Right Left   External Normal Normal         Slit Lamp Exam       Right Left   Lids/Lashes Normal Normal   Conjunctiva/Sclera White and quiet White and quiet   Cornea Stromal fine reticulated and punctate opacities, ?  Lattice degeneration, no epithelial involvement Stromal fine reticulated and punctate opacities, ?  Lattice degeneration, no epithelial involvement   Anterior Chamber Deep and quiet Deep and quiet   Iris Round and reactive Round and reactive   Lens Posterior chamber intraocular lens Posterior chamber intraocular lens   Anterior Vitreous Normal Normal         Fundus Exam       Right Left   Posterior Vitreous Posterior vitreous detachment    Disc Normal    C/D Ratio 0.3    Macula Epiretinal membrane, good focal laser, Macular thickening minor    Vessels PDR-quiet    Periphery good PRP, room for anterior PRP present temporally and nasally and superiorly             IMAGING AND PROCEDURES  Imaging and Procedures for 10/01/21  OCT, Retina - OU - Both Eyes       Right Eye Quality was good. Scan locations included subfoveal. Central Foveal Thickness: 307. Findings include abnormal foveal contour, epiretinal membrane.   Left Eye Quality was good. Scan locations included subfoveal. Central Foveal Thickness: 302. Progression has been stable. Findings include abnormal foveal contour, cystoid macular edema.   Notes OS decreased CSME at  1 week interval.  Repeat evaluation OS as scheduled   OD at  weeks post most recent injection looks great, with no recurrence of macular edema OD, will  consider repeat injection today Eylea and examination OD next in 5 to 6 weeks     Intravitreal Injection, Pharmacologic Agent - OD - Right Eye       Time Out 10/01/2021. 3:57 PM. Confirmed correct patient, procedure, site, and patient consented.   Anesthesia Topical anesthesia was used. Anesthetic medications included Lidocaine 4%.   Procedure Preparation included 5% betadine to ocular surface, 10% betadine to eyelids, Tobramycin 0.3%.   Injection: 2 mg aflibercept 2 MG/0.05ML   Route: Intravitreal, Site: Right Eye   NDC: [id number], Lot: [id number], Waste: 0 mL   Post-op Post injection exam found visual acuity of at least counting fingers. The patient tolerated the procedure well. There were no complications. The patient received written and verbal post procedure care education. Post injection medications were not given.              ASSESSMENT/PLAN:  Proliferative diabetic retinopathy of left eye with macular edema associated with type 2 diabetes mellitus (HCC) 1 week post Eylea OS, much less center involved CSME and improved acuity  Diabetic macular edema of right eye with proliferative retinopathy associated with type 2 diabetes mellitus ([place]) OD vastly improved overall, no recurrence of CSME today at interval follow-up of 5 weeks post most recent injection, repeat today and examination in 5 to 6 weeks  Right epiretinal membrane Minor no impact on acuity  Lattice corneal dystrophy of both eyes No epithelial or ocular surface disruption at this time and no complete opacification, follow-up with [facility]     ICD-10-CM   1. Proliferative diabetic retinopathy of left eye with macular edema associated with type 2 diabetes mellitus ([place])  S28.3151     2. Diabetic macular edema of right eye with proliferative  retinopathy associated with type 2 diabetes mellitus (HCC)  E11.3511 OCT, Retina - OU - Both Eyes    Intravitreal Injection, Pharmacologic Agent - OD - Right Eye    aflibercept (EYLEA) SOLN 2 mg    3. Right epiretinal membrane  H35.371     4. Lattice corneal dystrophy of both eyes  H18.543       1.  OU with quiescent PDR, stabilized  2.  OS with improved CSME 1 week post most recent injection Eylea  3.  OD with enhance and improved acuity as well no recurrence of CSME at 5-week interval post Eylea repeat injection today and maintain 5 to 6 weeks interval  4.  Patient should follow-up with [facility] Dr. Baldemar Lenis for nonemergent evaluation of corneal possible lattice degeneration of the stroma   5. OS, followup as scheduled  Ophthalmic Meds Ordered this visit:  Meds ordered this encounter  Medications   aflibercept (EYLEA) SOLN 2 mg       Return in about 6 weeks (around 11/12/2021) for dilate, OD, EYLEA OCT.  There are no Patient Instructions on file for this visit.   Explained the diagnoses, plan, and follow up with the patient and they expressed understanding.  Patient expressed understanding of the importance of proper follow up care.   Clent Demark Linus Weckerly M.D. Diseases & Surgery of the Retina and Vitreous Retina & Diabetic [place] 10/01/21     Abbreviations: M myopia (nearsighted); A astigmatism; H hyperopia (farsighted); P presbyopia; Mrx spectacle prescription;  CTL contact lenses; OD right eye; OS left eye; OU both eyes  XT exotropia; ET esotropia; PEK punctate epithelial keratitis; PEE punctate epithelial erosions; DES dry eye syndrome; MGD meibomian gland dysfunction; ATs artificial tears;  PFAT's preservative free artificial tears; [place] nuclear sclerotic cataract; PSC posterior subcapsular cataract; ERM epi-retinal membrane; PVD posterior vitreous detachment; RD retinal detachment; DM diabetes mellitus; DR diabetic retinopathy; NPDR non-proliferative  diabetic retinopathy; PDR proliferative diabetic retinopathy; CSME clinically significant macular edema; DME diabetic macular edema; dbh dot blot hemorrhages; CWS cotton wool spot; POAG primary open angle glaucoma; C/D cup-to-disc ratio; HVF humphrey visual field; GVF goldmann visual field; OCT optical coherence tomography; IOP intraocular pressure; BRVO Branch retinal vein occlusion; CRVO central retinal vein occlusion; CRAO central retinal artery occlusion; BRAO branch retinal artery occlusion; RT retinal tear; SB scleral buckle; PPV pars plana vitrectomy; VH Vitreous hemorrhage; PRP panretinal laser photocoagulation; IVK intravitreal kenalog; VMT vitreomacular traction; MH Macular hole;  NVD neovascularization of the disc; NVE neovascularization elsewhere; AREDS age related eye disease study; ARMD age related macular degeneration; POAG primary open angle glaucoma; EBMD epithelial/anterior basement membrane dystrophy; ACIOL anterior chamber intraocular lens; IOL intraocular lens; PCIOL posterior chamber intraocular lens; Phaco/IOL phacoemulsification with intraocular lens placement; [place] photorefractive keratectomy; LASIK laser assisted in situ keratomileusis; HTN hypertension; DM diabetes mellitus; COPD chronic obstructive pulmonary disease

## 2021-10-01 NOTE — Assessment & Plan Note (Signed)
OD vastly improved overall, no recurrence of CSME today at interval follow-up of 5 weeks post most recent injection, repeat today and examination in 5 to 6 weeks

## 2021-10-01 NOTE — Assessment & Plan Note (Signed)
1 week post Eylea OS, much less center involved CSME and improved acuity

## 2021-10-01 NOTE — Assessment & Plan Note (Signed)
Minor no impact on acuity 

## 2021-10-01 NOTE — Assessment & Plan Note (Signed)
No epithelial or ocular surface disruption at this time and no complete opacification, follow-up with [facility] eye care

## 2021-10-14 ENCOUNTER — Other Ambulatory Visit: Payer: Self-pay | Admitting: Internal Medicine

## 2021-10-14 DIAGNOSIS — N6310 Unspecified lump in the right breast, unspecified quadrant: Secondary | ICD-10-CM

## 2021-10-23 DIAGNOSIS — H538 Other visual disturbances: Secondary | ICD-10-CM | POA: Diagnosis not present

## 2021-10-23 DIAGNOSIS — H18543 Lattice corneal dystrophy, bilateral: Secondary | ICD-10-CM | POA: Diagnosis not present

## 2021-10-23 DIAGNOSIS — H524 Presbyopia: Secondary | ICD-10-CM | POA: Diagnosis not present

## 2021-11-01 ENCOUNTER — Other Ambulatory Visit: Payer: Self-pay | Admitting: Internal Medicine

## 2021-11-01 ENCOUNTER — Ambulatory Visit
Admission: RE | Admit: 2021-11-01 | Discharge: 2021-11-01 | Disposition: A | Discharge status: Home / Self Care | Payer: Medicare Other | Source: Ambulatory Visit | Attending: Internal Medicine | Admitting: Internal Medicine

## 2021-11-01 DIAGNOSIS — N6310 Unspecified lump in the right breast, unspecified quadrant: Secondary | ICD-10-CM

## 2021-11-01 DIAGNOSIS — N6311 Unspecified lump in the right breast, upper outer quadrant: Secondary | ICD-10-CM | POA: Diagnosis not present

## 2021-11-01 DIAGNOSIS — R922 Inconclusive mammogram: Secondary | ICD-10-CM | POA: Diagnosis not present

## 2021-11-05 ENCOUNTER — Other Ambulatory Visit: Payer: Self-pay

## 2021-11-05 ENCOUNTER — Ambulatory Visit (INDEPENDENT_AMBULATORY_CARE_PROVIDER_SITE_OTHER): Payer: Medicare Other | Admitting: Ophthalmology

## 2021-11-05 ENCOUNTER — Encounter (INDEPENDENT_AMBULATORY_CARE_PROVIDER_SITE_OTHER): Payer: Self-pay | Admitting: Ophthalmology

## 2021-11-05 DIAGNOSIS — E113512 Type 2 diabetes mellitus with proliferative diabetic retinopathy with macular edema, left eye: Secondary | ICD-10-CM | POA: Diagnosis not present

## 2021-11-05 DIAGNOSIS — H18543 Lattice corneal dystrophy, bilateral: Secondary | ICD-10-CM

## 2021-11-05 DIAGNOSIS — E113511 Type 2 diabetes mellitus with proliferative diabetic retinopathy with macular edema, right eye: Secondary | ICD-10-CM

## 2021-11-05 DIAGNOSIS — Z9989 Dependence on other enabling machines and devices: Secondary | ICD-10-CM | POA: Diagnosis not present

## 2021-11-05 DIAGNOSIS — H35371 Puckering of macula, right eye: Secondary | ICD-10-CM

## 2021-11-05 DIAGNOSIS — G4733 Obstructive sleep apnea (adult) (pediatric): Secondary | ICD-10-CM | POA: Diagnosis not present

## 2021-11-05 MED ORDER — AFLIBERCEPT 2MG/0.05ML IZ SOLN FOR KALEIDOSCOPE
2.0000 mg | INTRAVITREAL | Status: AC | PRN
  Administered 2021-11-05: 2 mg via INTRAVITREAL

## 2021-11-05 NOTE — Assessment & Plan Note (Signed)
Minor nasal to FAZ OD ?

## 2021-11-05 NOTE — Assessment & Plan Note (Signed)
Patient compliant still with the use of CPAP ?

## 2021-11-05 NOTE — Progress Notes (Signed)
? ? ?11/05/2021 ? ?  ? ?CHIEF COMPLAINT ?Patient presents for  ?Chief Complaint  ?Patient presents with  ? Diabetic Retinopathy with Macular Edema  ? ? ? ? ?HISTORY OF PRESENT ILLNESS: ?Grace [name] is a 71 y.o. female who presents to the clinic today for:  ? ?HPI   ?OS with persistent vision changes, no worse since last visit 6 weeks since last injection antivegF medication, Eylea OS ? ?OD at 5 weeks postinjection ? ?Patient did have follow-up evaluation with Dr. Herbert Deaner who confirmed corneal degeneration in the lattice type material but recommended that glasses will likely improve her visual acuity in the near term most effectively ?Last edited by Hurman Horn, MD on 11/05/2021  4:08 PM.  ?  ? ? ?Referring physician: ?Unk Pinto, MD ?[address] ?[address] ?[place],  [place] [address] ? ?HISTORICAL INFORMATION:  ? ?Selected notes from the [facility] ?  ? ?Lab Results  ?Component Value Date  ? HGBA1C 7.9 (H) 09/11/2021  ?  ? ?CURRENT MEDICATIONS: ?No current outpatient medications on file. (Ophthalmic Drugs)  ? ?No current facility-administered medications for this visit. (Ophthalmic Drugs)  ? ?Current Outpatient Medications (Other)  ?Medication Sig  ? acetaminophen (TYLENOL) 500 MG tablet Take 1,500 mg by mouth every 8 (eight) hours as needed for moderate pain.  ? Calcium 200 MG TABS Take 400 mg by mouth daily.  ? Cholecalciferol (DIALYVITE VITAMIN D 5000) 125 MCG (5000 UT) capsule Take 5,000 Units by mouth daily.  ? DULoxetine (CYMBALTA) 30 MG capsule Take 1 capsule (30 mg total) by mouth daily.  ? glimepiride (AMARYL) 2 MG tablet Take  1 tablet  2 x /day  with Meals  for Diabetes (Patient taking differently: Take 2 mg by mouth daily.)  ? glucose blood (ACCU-CHEK GUIDE) test strip Check blood sugar 3 times a day-DX-E11.69 AND E78.5.  ? Magnesium 250 MG TABS Take 500 mg by mouth daily.  ? metFORMIN (GLUCOPHAGE-XR) 500 MG 24 hr tablet TAKE 2 TABLETS TWICE DAILY FOR DIABETES  ? Omega-3 350  MG CPDR Take 350 mg by mouth 2 (two) times daily.  ? vitamin C (ASCORBIC ACID) 500 MG tablet Take 500 mg by mouth daily. (Patient not taking: Reported on 09/11/2021)  ? zinc gluconate 50 MG tablet Take 50 mg by mouth daily.  ? ?No current facility-administered medications for this visit. (Other)  ? ? ? ? ?REVIEW OF SYSTEMS: ?ROS   ?Negative for: Constitutional, Gastrointestinal, Neurological, Skin, Genitourinary, Musculoskeletal, HENT, Endocrine, Cardiovascular, Eyes, Respiratory, Psychiatric, Allergic/Imm, Heme/Lymph ?Last edited by Hurman Horn, MD on 11/05/2021  4:08 PM.  ?  ? ? ? ?ALLERGIES ?Allergies  ?Allergen Reactions  ? Gabapentin Swelling  ?  Swelling in legs and feet  ? ? ?PAST MEDICAL HISTORY ?Past Medical History:  ?Diagnosis Date  ? Abnormality of gait 07/28/2013  ? Chronic kidney disease   ? Diabetes ([place])   ? Diabetic peripheral neuropathy ([place])   ? Diabetic retinopathy ([place])   ? Difficult intubation   ? Foot drop, bilateral 07/31/2014  ? GERD (gastroesophageal reflux disease)   ? Peripheral edema   ? Polyneuropathy in diabetes(357.2) 07/28/2013  ? Sleep apnea   ? uses cpap  ? Tongue mass   ? ?Past Surgical History:  ?Procedure Laterality Date  ? ABDOMINAL HYSTERECTOMY    ? ANKLE FRACTURE SURGERY    ? left  ? CATARACT EXTRACTION W/PHACO Right 02/21/2014  ? Dr. [facility] Deaner  ? CATARACT EXTRACTION W/PHACO Left 04/10/2014  ?  Dr. Herbert Deaner  ? CATARACT EXTRACTION, BILATERAL    ? DIRECT LARYNGOSCOPY Left 08/02/2021  ? Procedure: DIRECT LARYNGOSCOPY WITH TONGUE BIOPSY;  Surgeon: Leta Baptist, MD;  Location: [facility];  Service: ENT;  Laterality: Left;  ? HIP SURGERY    ? right  ? MASS BIOPSY Left 09/06/2021  ? Procedure: NECK MASS BIOPSY;  Surgeon: Leta Baptist, MD;  Location: [facility];  Service: ENT;  Laterality: Left;  ? TONSILLECTOMY    ? ? ?FAMILY HISTORY ?Family History  ?Problem Relation Age of Onset  ? Diabetes Mother   ? Breast cancer Maternal Aunt   ? Diabetes Brother   ? Neuropathy  Neg Hx   ? ? ?SOCIAL HISTORY ?Social History  ? ?Tobacco Use  ? Smoking status: Never  ? Smokeless tobacco: Never  ?Substance Use Topics  ? Alcohol use: No  ? Drug use: No  ? ?  ? ?  ? ?OPHTHALMIC EXAM: ? ?Base Eye Exam   ? ? Visual Acuity (ETDRS)   ? ?   Right Left  ? Dist [place] 20/50 20/80  ? Dist ph [place]  20/70  ? ?  ?  ? ? Tonometry (Tonopen, 4:07 PM)   ? ?   Right Left  ? Pressure 16 17  ? ?  ?  ? ? Pupils   ? ?   Pupils APD  ? Right PERRL None  ? Left PERRL None  ? ?  ?  ? ? Visual Fields   ? ?   Left Right  ?  Full Full  ? ?  ?  ? ? Extraocular Movement   ? ?   Right Left  ?  Full, Ortho Full, Ortho  ? ?  ?  ? ? Neuro/Psych   ? ? Oriented x3: Yes  ? Mood/Affect: Normal  ? ?  ?  ? ? Dilation   ? ? Left eye: 1.0% Mydriacyl, 2.5% Phenylephrine @ 4:07 PM  ? ?  ?  ? ?  ? ?Slit Lamp and Fundus Exam   ? ? External Exam   ? ?   Right Left  ? External Normal Normal  ? ?  ?  ? ? Slit Lamp Exam   ? ?   Right Left  ? Lids/Lashes Normal Normal  ? Conjunctiva/Sclera White and quiet White and quiet  ? Cornea Stromal fine reticulated and punctate opacities, ?  Lattice degeneration, no epithelial involvement Stromal fine reticulated and punctate opacities, ?  Lattice degeneration, no epithelial involvement  ? Anterior Chamber Deep and quiet Deep and quiet  ? Iris Round and reactive Round and reactive  ? Lens Posterior chamber intraocular lens Posterior chamber intraocular lens  ? Anterior Vitreous Normal Normal  ? ?  ?  ? ? Fundus Exam   ? ?   Right Left  ? Posterior Vitreous  Posterior vitreous detachment  ? Disc  Normal  ? C/D Ratio  0.25  ? Macula  Microaneurysms, Macular thickening, Mild clinically significant macular edema  ? Vessels  Severe nonproliferative diabetic retinopathy with retinal nonperfusion  ? Periphery  Good peripheral PRP  ? ?  ?  ? ?  ? ? ?IMAGING AND PROCEDURES  ?Imaging and Procedures for 11/05/21 ? ?Intravitreal Injection, Pharmacologic Agent - OS - Left Eye   ? ?   ?Time Out ?11/05/2021. 4:35 PM. Confirmed  correct patient, procedure, site, and patient consented.  ? ?Anesthesia ?Topical anesthesia was used. Anesthetic medications included Lidocaine 4%.  ? ?  Procedure ?Preparation included 5% betadine to ocular surface, 10% betadine to eyelids, Tobramycin 0.3%. A 30 gauge needle was used.  ? ?Injection: ?2 mg aflibercept 2 MG/0.05ML ?  Route: Intravitreal, Site: Left Eye ?  [place]: [id number], Lot: [id number], Waste: 0 mL  ? ?Post-op ?Post injection exam found visual acuity of at least counting fingers. The patient tolerated the procedure well. There were no complications. The patient received written and verbal post procedure care education. Post injection medications included ocuflox.  ? ?  ? ?OCT, Retina - OU - Both Eyes   ? ?   ?Right Eye ?Quality was good. Scan locations included subfoveal. Central Foveal Thickness: 299. Findings include abnormal foveal contour, epiretinal membrane.  ? ?Left Eye ?Quality was good. Scan locations included subfoveal. Central Foveal Thickness: 350. Progression has been stable. Findings include abnormal foveal contour, cystoid macular edema.  ? ?Notes ?OS decreased CSME at 6 week interval.  Stable left eye, medial opacity from corneal degeneration, today at 6 weeks maintaining less CSME centrally, repeat Eylea injection today ? ? ?OD at  5 weeks post most recent injection looks great, with no recurrence of macular edema OD, with epiretinal membrane nasal portion of macula OD, media opacity from the corneal degeneration ? ?  ? ? ?  ?  ? ?  ?ASSESSMENT/PLAN: ? ?Proliferative diabetic retinopathy of left eye with macular edema associated with type 2 diabetes mellitus ([place]) ? The nature of diabetic macular edema was discussed with the patient. Treatment options were outlined including medical therapy, laser & vitrectomy. The use of injectable medications reviewed, including Avastin, Lucentis, and Eylea. Periodic injections into the eye are likely to resolve diabetic macular edema (swelling  in the center of vision). Initially, injections are delivered are delivered every 4-6 weeks, and the interval extended as the condition improves. On average, 8-9 injections the first year, and 5 in yea

## 2021-11-05 NOTE — Assessment & Plan Note (Signed)
The nature of diabetic macular edema was discussed with the patient. Treatment options were outlined including medical therapy, laser & vitrectomy. The use of injectable medications reviewed, including Avastin, Lucentis, and Eylea. Periodic injections into the eye are likely to resolve diabetic macular edema (swelling in the center of vision). Initially, injections are delivered are delivered every 4-6 weeks, and the interval extended as the condition improves. On average, 8-9 injections the first year, and 5 in year 2. Improvement in the condition most often improves on medical therapy. Occasional use of focal laser is also recommended for residual macular edema (swelling). Excellent control of blood glucose and blood pressure are encouraged under the care of a primary physician or endocrinologist. Similarly, attempts to maintain serum cholesterol, low density lipoproteins, and high-density lipoproteins in a favorable range were recommended.  ? ?OS active disease, yet still stable at 6-week follow-up interval today post Eylea injection.  Repeat injection today to maintain ?

## 2021-11-05 NOTE — Assessment & Plan Note (Signed)
OD, vastly improved overall stable at 5-week interval follow-up as scheduled ?

## 2021-11-05 NOTE — Assessment & Plan Note (Signed)
Recommendations as per Dr. [facility], refraction to assist. ? ? ?

## 2021-11-12 ENCOUNTER — Other Ambulatory Visit: Payer: Self-pay

## 2021-11-12 ENCOUNTER — Ambulatory Visit (INDEPENDENT_AMBULATORY_CARE_PROVIDER_SITE_OTHER): Payer: Medicare Other | Admitting: Ophthalmology

## 2021-11-12 ENCOUNTER — Encounter (INDEPENDENT_AMBULATORY_CARE_PROVIDER_SITE_OTHER): Payer: Self-pay | Admitting: Ophthalmology

## 2021-11-12 DIAGNOSIS — E113512 Type 2 diabetes mellitus with proliferative diabetic retinopathy with macular edema, left eye: Secondary | ICD-10-CM

## 2021-11-12 DIAGNOSIS — E113511 Type 2 diabetes mellitus with proliferative diabetic retinopathy with macular edema, right eye: Secondary | ICD-10-CM

## 2021-11-12 MED ORDER — AFLIBERCEPT 2MG/0.05ML IZ SOLN FOR KALEIDOSCOPE
2.0000 mg | INTRAVITREAL | Status: AC | PRN
  Administered 2021-11-12: 2 mg via INTRAVITREAL

## 2021-11-12 NOTE — Progress Notes (Signed)
? ? ?11/12/2021 ? ?  ? ?CHIEF COMPLAINT ?Patient presents for  ?Chief Complaint  ?Patient presents with  ? Diabetic Retinopathy with Macular Edema  ? ? ? ? ?HISTORY OF PRESENT ILLNESS: ?Grace [name] is a 71 y.o. female who presents to the clinic today for:  ? ?HPI   ?6 week fu OD OCT Eylea OD. ?Pt states "since my last injection I think my vision has improved a little." ?Denies new FOL or floaters. ? ?Last edited by Laurin Coder on 11/12/2021  3:36 PM.  ?  ? ? ?Referring physician: ?Unk Pinto, MD ?[address] ?[address] ?[place],  [place] [address] ? ?HISTORICAL INFORMATION:  ? ?Selected notes from the [facility] ?  ? ?Lab Results  ?Component Value Date  ? HGBA1C 7.9 (H) 09/11/2021  ?  ? ?CURRENT MEDICATIONS: ?No current outpatient medications on file. (Ophthalmic Drugs)  ? ?No current facility-administered medications for this visit. (Ophthalmic Drugs)  ? ?Current Outpatient Medications (Other)  ?Medication Sig  ? acetaminophen (TYLENOL) 500 MG tablet Take 1,500 mg by mouth every 8 (eight) hours as needed for moderate pain.  ? Calcium 200 MG TABS Take 400 mg by mouth daily.  ? Cholecalciferol (DIALYVITE VITAMIN D 5000) 125 MCG (5000 UT) capsule Take 5,000 Units by mouth daily.  ? DULoxetine (CYMBALTA) 30 MG capsule Take 1 capsule (30 mg total) by mouth daily.  ? glimepiride (AMARYL) 2 MG tablet Take  1 tablet  2 x /day  with Meals  for Diabetes (Patient taking differently: Take 2 mg by mouth daily.)  ? glucose blood (ACCU-CHEK GUIDE) test strip Check blood sugar 3 times a day-DX-E11.69 AND E78.5.  ? Magnesium 250 MG TABS Take 500 mg by mouth daily.  ? metFORMIN (GLUCOPHAGE-XR) 500 MG 24 hr tablet TAKE 2 TABLETS TWICE DAILY FOR DIABETES  ? Omega-3 350 MG CPDR Take 350 mg by mouth 2 (two) times daily.  ? vitamin C (ASCORBIC ACID) 500 MG tablet Take 500 mg by mouth daily. (Patient not taking: Reported on 09/11/2021)  ? zinc gluconate 50 MG tablet Take 50 mg by mouth daily.  ? ?No current  facility-administered medications for this visit. (Other)  ? ? ? ? ?REVIEW OF SYSTEMS: ?ROS   ?Negative for: Constitutional, Gastrointestinal, Neurological, Skin, Genitourinary, Musculoskeletal, HENT, Endocrine, Cardiovascular, Eyes, Respiratory, Psychiatric, Allergic/Imm, Heme/Lymph ?Last edited by Hurman Horn, MD on 11/12/2021  4:03 PM.  ?  ? ? ? ?ALLERGIES ?Allergies  ?Allergen Reactions  ? Gabapentin Swelling  ?  Swelling in legs and feet  ? ? ?PAST MEDICAL HISTORY ?Past Medical History:  ?Diagnosis Date  ? Abnormality of gait 07/28/2013  ? Chronic kidney disease   ? Diabetes ([place])   ? Diabetic peripheral neuropathy ([place])   ? Diabetic retinopathy ([place])   ? Difficult intubation   ? Foot drop, bilateral 07/31/2014  ? GERD (gastroesophageal reflux disease)   ? Peripheral edema   ? Polyneuropathy in diabetes(357.2) 07/28/2013  ? Sleep apnea   ? uses cpap  ? Tongue mass   ? ?Past Surgical History:  ?Procedure Laterality Date  ? ABDOMINAL HYSTERECTOMY    ? ANKLE FRACTURE SURGERY    ? left  ? CATARACT EXTRACTION W/PHACO Right 02/21/2014  ? Dr. Herbert Deaner  ? CATARACT EXTRACTION W/PHACO Left 04/10/2014  ? Dr. Herbert Deaner  ? CATARACT EXTRACTION, BILATERAL    ? DIRECT LARYNGOSCOPY Left 08/02/2021  ? Procedure: DIRECT LARYNGOSCOPY WITH TONGUE BIOPSY;  Surgeon: Leta Baptist, MD;  Location: [facility];  Service: ENT;  Laterality: Left;  ? HIP SURGERY    ? right  ? MASS BIOPSY Left 09/06/2021  ? Procedure: NECK MASS BIOPSY;  Surgeon: Leta Baptist, MD;  Location: [facility];  Service: ENT;  Laterality: Left;  ? TONSILLECTOMY    ? ? ?FAMILY HISTORY ?Family History  ?Problem Relation Age of Onset  ? Diabetes Mother   ? Breast cancer Maternal Aunt   ? Diabetes Brother   ? Neuropathy Neg Hx   ? ? ?SOCIAL HISTORY ?Social History  ? ?Tobacco Use  ? Smoking status: Never  ? Smokeless tobacco: Never  ?Substance Use Topics  ? Alcohol use: No  ? Drug use: No  ? ?  ? ?  ? ?OPHTHALMIC EXAM: ? ?Base Eye Exam   ? ? Visual  Acuity (ETDRS)   ? ?   Right Left  ? Dist [place] 20/30 -2 20/60  ? Dist ph [place]  20/50  ? ?  ?  ? ? Tonometry (Tonopen, 3:41 PM)   ? ?   Right Left  ? Pressure 23 18  ? ? Unable to assess: Yes  ? ?  ?  ? ? Pupils   ? ?   Pupils APD  ? Right PERRL None  ? Left PERRL None  ? ?  ?  ? ? Extraocular Movement   ? ?   Right Left  ?  Full Full  ? ?  ?  ? ? Neuro/Psych   ? ? Oriented x3: Yes  ? Mood/Affect: Normal  ? ?  ?  ? ? Dilation   ? ? Right eye: 1.0% Mydriacyl, 2.5% Phenylephrine @ 3:42 PM  ? ?  ?  ? ?  ? ?Slit Lamp and Fundus Exam   ? ? External Exam   ? ?   Right Left  ? External Normal Normal  ? ?  ?  ? ? Slit Lamp Exam   ? ?   Right Left  ? Lids/Lashes Normal Normal  ? Conjunctiva/Sclera White and quiet White and quiet  ? Cornea Stromal fine reticulated and punctate opacities, ?  Lattice degeneration, no epithelial involvement Stromal fine reticulated and punctate opacities, ?  Lattice degeneration, no epithelial involvement  ? Anterior Chamber Deep and quiet Deep and quiet  ? Iris Round and reactive Round and reactive  ? Lens Posterior chamber intraocular lens Posterior chamber intraocular lens  ? Anterior Vitreous Normal Normal  ? ?  ?  ? ? Fundus Exam   ? ?   Right Left  ? Posterior Vitreous Posterior vitreous detachment   ? Disc Normal   ? C/D Ratio 0.3   ? Macula Epiretinal membrane, good focal laser, Macular thickening minor   ? Vessels PDR-quiet   ? Periphery good PRP, room for anterior PRP present temporally and nasally and superiorly   ? ?  ?  ? ?  ? ? ?IMAGING AND PROCEDURES  ?Imaging and Procedures for 11/12/21 ? ?Intravitreal Injection, Pharmacologic Agent - OD - Right Eye   ? ?   ?Time Out ?11/12/2021. 4:07 PM. Confirmed correct patient, procedure, site, and patient consented.  ? ?Anesthesia ?Topical anesthesia was used. Anesthetic medications included Lidocaine 4%.  ? ?Procedure ?Preparation included 5% betadine to ocular surface, 10% betadine to eyelids, Tobramycin 0.3%.  ? ?Injection: ?2 mg aflibercept 2  MG/0.05ML ?  Route: Intravitreal, Site: Right Eye ?  [place]: [id number], Lot: [id number], Waste: 0 mL  ? ?Post-op ?Post injection exam found  visual acuity of at least counting fingers. The patient tolerated the procedure well. There were no complications. The patient received written and verbal post procedure care education. Post injection medications were not given.  ? ?  ? ?OCT, Retina - OU - Both Eyes   ? ?   ?Right Eye ?Quality was good. Scan locations included subfoveal. Central Foveal Thickness: 302. Findings include abnormal foveal contour, epiretinal membrane.  ? ?Left Eye ?Quality was good. Scan locations included subfoveal. Central Foveal Thickness: 299. Progression has improved. Findings include abnormal foveal contour, cystoid macular edema.  ? ?Notes ?OS decreased CSME at 1 week interval.  Stable left eye, media opacity from corneal degeneration, repeat exam OS as scheduled ? ?OD at  6 weeks post most recent injection looks great, with no recurrence of macular edema OD, with epiretinal membrane nasal portion of macula OD, stable since last week but improved overall media opacity from the corneal degeneration, repeat injection intravitreal Eylea today to maintain ? ?  ? ? ?  ?  ? ?  ?ASSESSMENT/PLAN: ? ?Proliferative diabetic retinopathy of left eye with macular edema associated with type 2 diabetes mellitus ([place]) ?Slightly improved further OS 1 week after most recent examination and Eylea, follow-up as scheduled ? ?Diabetic macular edema of right eye with proliferative retinopathy associated with type 2 diabetes mellitus ([place]) ?OD stable overall from last visit but improved over the last 6 weeks OD, stable acuity on Eylea.  Repeat injection today ? ?Examination today does disclose an area superonasal which could be a source of ongoing vegF release triggering the CSME.  We will consider peripheral PRP superonasally in the right eye 1 day  ? ?  ICD-10-CM   ?1. Diabetic macular edema of right eye with  proliferative retinopathy associated with type 2 diabetes mellitus (HCC)  E11.3511 Intravitreal Injection, Pharmacologic Agent - OD - Right Eye  ?  OCT, Retina - OU - Both Eyes  ?  aflibercept (EYLEA) SOLN 2 mg

## 2021-11-12 NOTE — Assessment & Plan Note (Signed)
OD stable overall from last visit but improved over the last 6 weeks OD, stable acuity on Eylea.  Repeat injection today ? ?Examination today does disclose an area superonasal which could be a source of ongoing vegF release triggering the CSME.  We will consider peripheral PRP superonasally in the right eye 1 day ?

## 2021-11-12 NOTE — Assessment & Plan Note (Signed)
Slightly improved further OS 1 week after most recent examination and Eylea, follow-up as scheduled ?

## 2021-12-03 ENCOUNTER — Ambulatory Visit
Admission: RE | Admit: 2021-12-03 | Discharge: 2021-12-03 | Disposition: A | Discharge status: Home / Self Care | Payer: Medicare Other | Source: Ambulatory Visit | Attending: Internal Medicine | Admitting: Internal Medicine

## 2021-12-03 DIAGNOSIS — N6310 Unspecified lump in the right breast, unspecified quadrant: Secondary | ICD-10-CM

## 2021-12-03 DIAGNOSIS — N6011 Diffuse cystic mastopathy of right breast: Secondary | ICD-10-CM | POA: Diagnosis not present

## 2021-12-03 DIAGNOSIS — N6311 Unspecified lump in the right breast, upper outer quadrant: Secondary | ICD-10-CM | POA: Diagnosis not present

## 2021-12-03 HISTORY — PX: BREAST BIOPSY: SHX20

## 2021-12-10 ENCOUNTER — Encounter: Payer: Self-pay | Admitting: Internal Medicine

## 2021-12-10 NOTE — Patient Instructions (Signed)

## 2021-12-10 NOTE — Progress Notes (Signed)
? ? ?Called in [facility] [place]  &  ?Cancelled ? ? ? ? ? ? ? ? ? ? ? ? ? ? ? ? ? ? ? ? ? ? ? ? ? ? ? ? ? ? ? ? ? ? ? ? ? ? ? ? ? ? ? ? ? ? ? ? ? ? ? ? ? ? ? ? ? ? ? ? ? ? ? ? ? ? ? ? ? ? ? ? ? ? ? ? ? ? ? ? ? ? ? ? ? ? ? ? ? ? ? ? ? ? ? ? ? ? ? ? ? ? ? ? ? ? ? ? ? ? ? ? ? ? ? ? ? ? ? ? ? ? ? ? ? ? ? ? ? ? ? ? ? ? ? ? ? ? ? ? ? ? ? ? ? ? ? ? ? ? ? ? ? ? ? ? ? ? ? ? ? ? ? ? ? ? ? ? ? ? ? ? ? ? ? ? ? ? ? ? ? ? ? ? ? ? ? ? ? ? ? ? ? ? ? ? ? ? ? ? ? ? ? ? ? ? ? ? ? ? ? ? ? ? ? ? ? ? ? ? ? ? ? ? ? ? ? ? ? ? ? ? ? ? ? ? ? ? ? ? ? ? ? ? ? ? ? ? ? ? ? ? ? ? ? ? ? ? ? ? ? ? ? ? ? ?Future Appointments  ?Date Time Provider Department  ?12/11/2021  9:30 AM Unk Pinto, MD [facility]  ?12/16/2021  3:30 PM [name] Clent Demark, MD [facility]  ?12/24/2021  3:30 PM [name] Clent Demark, MD [facility]  ?01/22/2022 11:15 AM Suzzanne Cloud, NP [facility]  ?06/11/2022             CPE 10:00 AM Magda Bernheim, NP [facility]  ?06/26/2022               Wellness 11:00 AM Magda Bernheim, NP [facility]  ? ? ?History of Present Illness: ? ? ?    This very nice 71 y.o. MWF presents for 3 month follow up with HTN, HLD, T2_DM and Vitamin D Deficiency. Patient is on CPAP for OSA  per Dr Brett Fairy with improved sleep hygiene. ? ? ?    Patient is followed for  hx/HTN  predating circa 1960's & BP has been controlled  tapered off meds.  Today?s  . Patient has had no complaints of any cardiac type chest pain, palpitations, dyspnea / orthopnea / PND, dizziness, claudication, or dependent edema. ? ? ?    Hyperlipidemia is controlled with diet . Last Lipids were at goal except sl elevated Trig's : ? ?Lab Results  ?Component Value Date  ? CHOL 168 09/11/2021  ? HDL 55 09/11/2021  ? [place] 84 09/11/2021  ? TRIG 191 (H) 09/11/2021  ? CHOLHDL 3.1 09/11/2021  ? ? ? ?Also, the patient has history of Gestational DM (1980) and then in 2006 was started on Metformin fo T2_NIDDM w/CKD2 (GFR 65)  > Patient is followed closely by Dr [name] for Diabetic Retinopathy & Wet MD.  She also has painful Diabetic   Peripheral Sensory Neuropathy with bilateral foot drop.  She has had no symptoms of reactive hypoglycemia, diabetic polys.  Last A1c was not at goal : ? ?Lab Results  ?Component Value Date  ?  HGBA1C 7.9 (H) 09/11/2021  ? ?    ? ?                                                   Further, the patient also has history of Vitamin D Deficiency ("26" /2016) and supplements vitamin D . Last vitamin D was at goal : ? ?Lab Results  ?Component Value Date  ? VD25OH 68 06/10/2021  ? ? ? ?Current Outpatient Medications on File Prior to Visit  ?Medication Sig  ? acetaminophen 500 MG tablet Take 1,500 mg  every 8  hrs as needed for pain.  ? Calcium 200 MG TABS Take 400 mg  daily.  ? VITAMIN D 5000 u  Take daily.  ? DULoxetine 30 MG capsule Take 1 capsule daily.  ? glimepiride 2 MG tablet Take  daily.)  ? Magnesium 250 MG TABS Take 500 mg  daily.  ? metFORMIN-XR 500 MG TAKE 2 TABLETS TWICE DAILY   ? Omega-3 350 MG  Take  2  times daily.  ? zinc 50 MG tablet Take  daily.  ? ? ?1. Essential hypertension ? ?- Continue medication, monitor blood pressure at home.  ?- Continue DASH diet.  Reminder to go to the ER if any CP,  ?SOB, nausea, dizziness, severe HA, changes vision/speech. ?   ?- CBC with Differential/Platelet ?- COMPLETE METABOLIC PANEL WITH GFR ?- Magnesium ?- TSH ? ?2. Hyperlipidemia associated with type 2 diabetes mellitus ([place]) ? ?- Continue diet/meds, exercise,& lifestyle modifications.  ?- Continue monitor periodic cholesterol/liver & renal functions  ?   ?- Lipid panel ?- TSH ? ?3. Type 2 diabetes mellitus with stage 2 chronic kidney  ?disease, without long-term current use of insulin ([place]) ? ?- Continue diet, exercise  ?- Lifestyle modifications.  ?- Monitor appropriate labs.\ ?   ?- Hemoglobin A1c ?- Insulin, random ? ?4. Diabetic retinopathy of both eyes with macular edema    ([place]) ? ? ?- Hemoglobin A1c ? ?5. Diabetic polyneuropathy associated with diabetes ? mellitus due to underlying condition ([place]) ? ?-  Hemoglobin A1c ? ?6. Vitamin D deficiency ? ?- Continue supplementation ?    ?- VITAMIN D 25 Hydroxy  ? ?7. OSA on CPAP ? ? ?8. Medication management ? ?- CBC with Differential/Platelet ?- COMPLETE METABOLIC PANEL WITH GFR ?- Magnesium ?- Lipid panel ?- TSH ?- Hemoglobin A1c ?- Insulin, random ?- VITAMIN D 25 Hydroxy  ? ? ?

## 2021-12-11 ENCOUNTER — Ambulatory Visit: Payer: Medicare Other | Admitting: Internal Medicine

## 2021-12-11 DIAGNOSIS — E0842 Diabetes mellitus due to underlying condition with diabetic polyneuropathy: Secondary | ICD-10-CM

## 2021-12-11 DIAGNOSIS — E1122 Type 2 diabetes mellitus with diabetic chronic kidney disease: Secondary | ICD-10-CM

## 2021-12-11 DIAGNOSIS — E1169 Type 2 diabetes mellitus with other specified complication: Secondary | ICD-10-CM

## 2021-12-11 DIAGNOSIS — G4733 Obstructive sleep apnea (adult) (pediatric): Secondary | ICD-10-CM

## 2021-12-11 DIAGNOSIS — E08311 Diabetes mellitus due to underlying condition with unspecified diabetic retinopathy with macular edema: Secondary | ICD-10-CM

## 2021-12-11 DIAGNOSIS — I10 Essential (primary) hypertension: Secondary | ICD-10-CM

## 2021-12-11 DIAGNOSIS — Z79899 Other long term (current) drug therapy: Secondary | ICD-10-CM

## 2021-12-11 DIAGNOSIS — E559 Vitamin D deficiency, unspecified: Secondary | ICD-10-CM

## 2021-12-16 ENCOUNTER — Encounter (INDEPENDENT_AMBULATORY_CARE_PROVIDER_SITE_OTHER): Payer: Medicare Other | Admitting: Ophthalmology

## 2021-12-17 ENCOUNTER — Encounter (INDEPENDENT_AMBULATORY_CARE_PROVIDER_SITE_OTHER): Payer: Medicare Other | Admitting: Ophthalmology

## 2021-12-19 ENCOUNTER — Encounter (INDEPENDENT_AMBULATORY_CARE_PROVIDER_SITE_OTHER): Payer: Self-pay | Admitting: Ophthalmology

## 2021-12-19 ENCOUNTER — Encounter (INDEPENDENT_AMBULATORY_CARE_PROVIDER_SITE_OTHER): Payer: Medicare Other | Admitting: Ophthalmology

## 2021-12-19 ENCOUNTER — Ambulatory Visit (INDEPENDENT_AMBULATORY_CARE_PROVIDER_SITE_OTHER): Payer: Medicare Other | Admitting: Ophthalmology

## 2021-12-19 DIAGNOSIS — H35371 Puckering of macula, right eye: Secondary | ICD-10-CM

## 2021-12-19 DIAGNOSIS — H18543 Lattice corneal dystrophy, bilateral: Secondary | ICD-10-CM | POA: Diagnosis not present

## 2021-12-19 DIAGNOSIS — E113511 Type 2 diabetes mellitus with proliferative diabetic retinopathy with macular edema, right eye: Secondary | ICD-10-CM | POA: Diagnosis not present

## 2021-12-19 DIAGNOSIS — E113512 Type 2 diabetes mellitus with proliferative diabetic retinopathy with macular edema, left eye: Secondary | ICD-10-CM

## 2021-12-19 DIAGNOSIS — H35372 Puckering of macula, left eye: Secondary | ICD-10-CM | POA: Diagnosis not present

## 2021-12-19 DIAGNOSIS — H538 Other visual disturbances: Secondary | ICD-10-CM | POA: Diagnosis not present

## 2021-12-19 LAB — HM DIABETES EYE EXAM

## 2021-12-19 MED ORDER — AFLIBERCEPT 2MG/0.05ML IZ SOLN FOR KALEIDOSCOPE
2.0000 mg | INTRAVITREAL | Status: AC | PRN
  Administered 2021-12-19: 2 mg via INTRAVITREAL

## 2021-12-19 NOTE — Progress Notes (Signed)
? ? ?12/19/2021 ? ?  ? ?CHIEF COMPLAINT ?Patient presents for  ?Chief Complaint  ?Patient presents with  ? Diabetic Retinopathy with Macular Edema  ? ? ? ? ?HISTORY OF PRESENT ILLNESS: ?Grace [name] is a 71 y.o. female who presents to the clinic today for:  ? ?HPI   ?6 weeks OS, Eylea OS, OCT. ?Patient states she was seen by Dr. Herbert Deaner and received a new glasses prescription she had filled within the last month. States "my right eye is seeing better with the glasses." ?Last edited by Laurin Coder on 12/19/2021  3:17 PM.  ?  ? ? ?Referring physician: ?Unk Pinto, MD ?[address] ?[address] ?[place],  [place] [address] ? ?HISTORICAL INFORMATION:  ? ?Selected notes from the [facility] ?  ? ?Lab Results  ?Component Value Date  ? HGBA1C 7.9 (H) 09/11/2021  ?  ? ?CURRENT MEDICATIONS: ?No current outpatient medications on file. (Ophthalmic Drugs)  ? ?No current facility-administered medications for this visit. (Ophthalmic Drugs)  ? ?Current Outpatient Medications (Other)  ?Medication Sig  ? acetaminophen (TYLENOL) 500 MG tablet Take 1,500 mg by mouth every 8 (eight) hours as needed for moderate pain.  ? Calcium 200 MG TABS Take 400 mg by mouth daily.  ? Cholecalciferol (DIALYVITE VITAMIN D 5000) 125 MCG (5000 UT) capsule Take 5,000 Units by mouth daily.  ? DULoxetine (CYMBALTA) 30 MG capsule Take 1 capsule (30 mg total) by mouth daily.  ? glimepiride (AMARYL) 2 MG tablet Take  1 tablet  2 x /day  with Meals  for Diabetes (Patient taking differently: Take 2 mg by mouth daily.)  ? glucose blood (ACCU-CHEK GUIDE) test strip Check blood sugar 3 times a day-DX-E11.69 AND E78.5.  ? Magnesium 250 MG TABS Take 500 mg by mouth daily.  ? metFORMIN (GLUCOPHAGE-XR) 500 MG 24 hr tablet TAKE 2 TABLETS TWICE DAILY FOR DIABETES  ? Omega-3 350 MG CPDR Take 350 mg by mouth 2 (two) times daily.  ? vitamin C (ASCORBIC ACID) 500 MG tablet Take 500 mg by mouth daily. (Patient not taking: Reported on 09/11/2021)  ? zinc  gluconate 50 MG tablet Take 50 mg by mouth daily.  ? ?No current facility-administered medications for this visit. (Other)  ? ? ? ? ?REVIEW OF SYSTEMS: ?ROS   ?Negative for: Constitutional, Gastrointestinal, Neurological, Skin, Genitourinary, Musculoskeletal, HENT, Endocrine, Cardiovascular, Eyes, Respiratory, Psychiatric, Allergic/Imm, Heme/Lymph ?Last edited by Hurman Horn, MD on 12/19/2021  3:35 PM.  ?  ? ? ? ?ALLERGIES ?Allergies  ?Allergen Reactions  ? Gabapentin Swelling  ?  Swelling in legs and feet  ? ? ?PAST MEDICAL HISTORY ?Past Medical History:  ?Diagnosis Date  ? Abnormality of gait 07/28/2013  ? Chronic kidney disease   ? Diabetes ([place])   ? Diabetic peripheral neuropathy ([facility])   ? Diabetic retinopathy ([place])   ? Difficult intubation   ? Foot drop, bilateral 07/31/2014  ? GERD (gastroesophageal reflux disease)   ? Peripheral edema   ? Polyneuropathy in diabetes(357.2) 07/28/2013  ? Sleep apnea   ? uses cpap  ? Tongue mass   ? ?Past Surgical History:  ?Procedure Laterality Date  ? ABDOMINAL HYSTERECTOMY    ? ANKLE FRACTURE SURGERY    ? left  ? CATARACT EXTRACTION W/PHACO Right 02/21/2014  ? Dr. Herbert Deaner  ? CATARACT EXTRACTION W/PHACO Left 04/10/2014  ? Dr. Herbert Deaner  ? CATARACT EXTRACTION, BILATERAL    ? DIRECT LARYNGOSCOPY Left 08/02/2021  ? Procedure: DIRECT LARYNGOSCOPY WITH TONGUE BIOPSY;  Surgeon: Leta Baptist, MD;  Location: [facility];  Service: ENT;  Laterality: Left;  ? HIP SURGERY    ? right  ? MASS BIOPSY Left 09/06/2021  ? Procedure: NECK MASS BIOPSY;  Surgeon: Leta Baptist, MD;  Location: [facility];  Service: ENT;  Laterality: Left;  ? TONSILLECTOMY    ? ? ?FAMILY HISTORY ?Family History  ?Problem Relation Age of Onset  ? Diabetes Mother   ? Breast cancer Maternal Aunt   ? Diabetes Brother   ? Neuropathy Neg Hx   ? ? ?SOCIAL HISTORY ?Social History  ? ?Tobacco Use  ? Smoking status: Never  ? Smokeless tobacco: Never  ?Substance Use Topics  ? Alcohol use: No  ? Drug use: No   ? ?  ? ?  ? ?OPHTHALMIC EXAM: ? ?Base Eye Exam   ? ? Visual Acuity (ETDRS)   ? ?   Right Left  ? Dist cc 20/30 +2 20/60 +2  ? Dist ph cc  NI  ? ? Correction: Glasses  ? ?  ?  ? ? Tonometry (Tonopen, 3:20 PM)   ? ?   Right Left  ? Pressure 17 17  ? ?  ?  ? ? Pupils   ? ?   Pupils APD  ? Right PERRL None  ? Left PERRL None  ? ?  ?  ? ? Extraocular Movement   ? ?   Right Left  ?  Full Full  ? ?  ?  ? ? Neuro/Psych   ? ? Oriented x3: Yes  ? Mood/Affect: Normal  ? ?  ?  ? ? Dilation   ? ? Left eye: 1.0% Mydriacyl, 2.5% Phenylephrine @ 3:20 PM  ? ?  ?  ? ?  ? ?Slit Lamp and Fundus Exam   ? ? External Exam   ? ?   Right Left  ? External Normal Normal  ? ?  ?  ? ? Slit Lamp Exam   ? ?   Right Left  ? Lids/Lashes Normal Normal  ? Conjunctiva/Sclera White and quiet White and quiet  ? Cornea Stromal fine reticulated and punctate opacities, ?  Lattice degeneration, no epithelial involvement Stromal fine reticulated and punctate opacities, ?  Lattice degeneration, no epithelial involvement  ? Anterior Chamber Deep and quiet Deep and quiet  ? Iris Round and reactive Round and reactive  ? Lens Posterior chamber intraocular lens Posterior chamber intraocular lens  ? Anterior Vitreous Normal Normal  ? ?  ?  ? ? Fundus Exam   ? ?   Right Left  ? Posterior Vitreous  Posterior vitreous detachment  ? Disc  Normal  ? C/D Ratio  0.25  ? Macula  Microaneurysms, Macular thickening, Mild clinically significant macular edema  ? Vessels  Severe nonproliferative diabetic retinopathy with retinal nonperfusion  ? Periphery  Good peripheral PRP  ? ?  ?  ? ?  ? ? ?IMAGING AND PROCEDURES  ?Imaging and Procedures for 12/19/21 ? ?OCT, Retina - OU - Both Eyes   ? ?   ?Right Eye ?Quality was good. Scan locations included subfoveal. Central Foveal Thickness: 296. Findings include abnormal foveal contour, epiretinal membrane.  ? ?Left Eye ?Quality was good. Scan locations included subfoveal. Central Foveal Thickness: 340. Progression has improved.  Findings include abnormal foveal contour, cystoid macular edema.  ? ?Notes ?OS decreased CSME at 6 week interval.  Stable left eye, media opacity from corneal degeneration, repeat injection OS today ? ?  OD at  5 weeks post most recent injection looks great, with no recurrence of macular edema OD, with epiretinal membrane nasal portion of macula OD, stable since last week but improved overall media opacity from the corneal degeneration, repeat dilation OD as scheduled ? ?  ? ?Intravitreal Injection, Pharmacologic Agent - OS - Left Eye   ? ?   ?Time Out ?12/19/2021. 3:41 PM. Confirmed correct patient, procedure, site, and patient consented.  ? ?Anesthesia ?Topical anesthesia was used. Anesthetic medications included Lidocaine 4%.  ? ?Procedure ?Preparation included 5% betadine to ocular surface, 10% betadine to eyelids, Tobramycin 0.3%. A 30 gauge needle was used.  ? ?Injection: ?2 mg aflibercept 2 MG/0.05ML ?  Route: Intravitreal, Site: Left Eye ?  [place]: [id number], Lot: [id number], Waste: 0 mL  ? ?Post-op ?Post injection exam found visual acuity of at least counting fingers. The patient tolerated the procedure well. There were no complications. The patient received written and verbal post procedure care education. Post injection medications included ocuflox.  ? ?  ? ? ?  ?  ? ?  ?ASSESSMENT/PLAN: ? ?Lattice corneal dystrophy of both eyes ?Continues under the care of Dr. Herbert Deaner, visited earlier today ? ?Diabetic macular edema of right eye with proliferative retinopathy associated with type 2 diabetes mellitus ([place]) ?Much improved and stable macular edema of the right eye.  No recurrence of macular edema today at 5-week interval. ? ?Proliferative diabetic retinopathy of left eye with macular edema associated with type 2 diabetes mellitus ([place]) ?Chronic active CSME mostly temporal to the portion of the FAZ OS. ? ?Today at 6-week interval.  Repeat injection intravitreal Eylea today ? ?Right epiretinal membrane ?No impact  on acuity OD ? ?Epiretinal membrane, left eye ?OS stable over time no impact on acuity  ? ?  ICD-10-CM   ?1. Diabetic macular edema of right eye with proliferative retinopathy associated with type 2 diabetes me

## 2021-12-19 NOTE — Assessment & Plan Note (Signed)
OS stable over time no impact on acuity ?

## 2021-12-19 NOTE — Assessment & Plan Note (Signed)
No impact on acuity OD ?

## 2021-12-19 NOTE — Assessment & Plan Note (Addendum)
Chronic active CSME mostly temporal to the portion of the FAZ OS. ? ?Today at 6-week interval.  Repeat injection intravitreal Eylea today ?

## 2021-12-19 NOTE — Assessment & Plan Note (Signed)
Much improved and stable macular edema of the right eye.  No recurrence of macular edema today at 5-week interval. ?

## 2021-12-19 NOTE — Assessment & Plan Note (Signed)
Continues under the care of Dr. Herbert Deaner, visited earlier today ?

## 2021-12-24 ENCOUNTER — Encounter (INDEPENDENT_AMBULATORY_CARE_PROVIDER_SITE_OTHER): Payer: Self-pay | Admitting: Ophthalmology

## 2021-12-24 ENCOUNTER — Ambulatory Visit (INDEPENDENT_AMBULATORY_CARE_PROVIDER_SITE_OTHER): Payer: Medicare Other | Admitting: Ophthalmology

## 2021-12-24 DIAGNOSIS — E113511 Type 2 diabetes mellitus with proliferative diabetic retinopathy with macular edema, right eye: Secondary | ICD-10-CM

## 2021-12-24 DIAGNOSIS — E113512 Type 2 diabetes mellitus with proliferative diabetic retinopathy with macular edema, left eye: Secondary | ICD-10-CM | POA: Diagnosis not present

## 2021-12-24 MED ORDER — AFLIBERCEPT 2MG/0.05ML IZ SOLN FOR KALEIDOSCOPE
2.0000 mg | INTRAVITREAL | Status: AC | PRN
  Administered 2021-12-24: 2 mg via INTRAVITREAL

## 2021-12-24 NOTE — Progress Notes (Signed)
? ? ?12/24/2021 ? ?  ? ?CHIEF COMPLAINT ?Patient presents for  ?Chief Complaint  ?Patient presents with  ? Diabetic Retinopathy with Macular Edema  ? ? ? ? ?HISTORY OF PRESENT ILLNESS: ?Grace [name] is a 71 y.o. female who presents to the clinic today for:  ? ?HPI   ?6 weeks dilate OD, Eylea OD, OCT. ?Patient states "I do think there is improvement in the left eye." ?Patient is not using any prescription eye drops. ? ?Last edited by Laurin Coder on 12/24/2021  3:33 PM.  ?  ? ? ?Referring physician: ?Unk Pinto, MD ?[address] ?[address] ?[place],  [place] [address] ? ?HISTORICAL INFORMATION:  ? ?Selected notes from the [facility] ?  ? ?Lab Results  ?Component Value Date  ? HGBA1C 7.9 (H) 09/11/2021  ?  ? ?CURRENT MEDICATIONS: ?No current outpatient medications on file. (Ophthalmic Drugs)  ? ?No current facility-administered medications for this visit. (Ophthalmic Drugs)  ? ?Current Outpatient Medications (Other)  ?Medication Sig  ? acetaminophen (TYLENOL) 500 MG tablet Take 1,500 mg by mouth every 8 (eight) hours as needed for moderate pain.  ? Calcium 200 MG TABS Take 400 mg by mouth daily.  ? Cholecalciferol (DIALYVITE VITAMIN D 5000) 125 MCG (5000 UT) capsule Take 5,000 Units by mouth daily.  ? DULoxetine (CYMBALTA) 30 MG capsule Take 1 capsule (30 mg total) by mouth daily.  ? glimepiride (AMARYL) 2 MG tablet Take  1 tablet  2 x /day  with Meals  for Diabetes (Patient taking differently: Take 2 mg by mouth daily.)  ? glucose blood (ACCU-CHEK GUIDE) test strip Check blood sugar 3 times a day-DX-E11.69 AND E78.5.  ? Magnesium 250 MG TABS Take 500 mg by mouth daily.  ? metFORMIN (GLUCOPHAGE-XR) 500 MG 24 hr tablet TAKE 2 TABLETS TWICE DAILY FOR DIABETES  ? Omega-3 350 MG CPDR Take 350 mg by mouth 2 (two) times daily.  ? vitamin C (ASCORBIC ACID) 500 MG tablet Take 500 mg by mouth daily. (Patient not taking: Reported on 09/11/2021)  ? zinc gluconate 50 MG tablet Take 50 mg by mouth daily.   ? ?No current facility-administered medications for this visit. (Other)  ? ? ? ? ?REVIEW OF SYSTEMS: ?ROS   ?Negative for: Constitutional, Gastrointestinal, Neurological, Skin, Genitourinary, Musculoskeletal, HENT, Endocrine, Cardiovascular, Eyes, Respiratory, Psychiatric, Allergic/Imm, Heme/Lymph ?Last edited by Hurman Horn, MD on 12/24/2021  4:28 PM.  ?  ? ? ? ?ALLERGIES ?Allergies  ?Allergen Reactions  ? Gabapentin Swelling  ?  Swelling in legs and feet  ? ? ?PAST MEDICAL HISTORY ?Past Medical History:  ?Diagnosis Date  ? Abnormality of gait 07/28/2013  ? Chronic kidney disease   ? Diabetes ([place])   ? Diabetic peripheral neuropathy ([place])   ? Diabetic retinopathy ([place])   ? Difficult intubation   ? Foot drop, bilateral 07/31/2014  ? GERD (gastroesophageal reflux disease)   ? Peripheral edema   ? Polyneuropathy in diabetes(357.2) 07/28/2013  ? Sleep apnea   ? uses cpap  ? Tongue mass   ? ?Past Surgical History:  ?Procedure Laterality Date  ? ABDOMINAL HYSTERECTOMY    ? ANKLE FRACTURE SURGERY    ? left  ? CATARACT EXTRACTION W/PHACO Right 02/21/2014  ? Dr. Herbert Deaner  ? CATARACT EXTRACTION W/PHACO Left 04/10/2014  ? Dr. Herbert Deaner  ? CATARACT EXTRACTION, BILATERAL    ? DIRECT LARYNGOSCOPY Left 08/02/2021  ? Procedure: DIRECT LARYNGOSCOPY WITH TONGUE BIOPSY;  Surgeon: Leta Baptist, MD;  Location: [facility];  Service: ENT;  Laterality: Left;  ? HIP SURGERY    ? right  ? MASS BIOPSY Left 09/06/2021  ? Procedure: NECK MASS BIOPSY;  Surgeon: Leta Baptist, MD;  Location: [facility];  Service: ENT;  Laterality: Left;  ? TONSILLECTOMY    ? ? ?FAMILY HISTORY ?Family History  ?Problem Relation Age of Onset  ? Diabetes Mother   ? Breast cancer Maternal Aunt   ? Diabetes Brother   ? Neuropathy Neg Hx   ? ? ?SOCIAL HISTORY ?Social History  ? ?Tobacco Use  ? Smoking status: Never  ? Smokeless tobacco: Never  ?Substance Use Topics  ? Alcohol use: No  ? Drug use: No  ? ?  ? ?  ? ?OPHTHALMIC EXAM: ? ?Base Eye Exam    ? ? Visual Acuity (ETDRS)   ? ?   Right Left  ? Dist cc 20/30 -2 20/60 -2+2  ? Dist ph cc  20/50 -1  ? ? Correction: Glasses  ? ?  ?  ? ? Tonometry (Tonopen, 3:38 PM)   ? ?   Right Left  ? Pressure 18 13  ? ?  ?  ? ? Pupils   ? ?   Pupils APD  ? Right PERRL None  ? Left PERRL None  ? ?  ?  ? ? Visual Fields (Counting fingers)   ? ?   Left Right  ?  Full Full  ? ?  ?  ? ? Extraocular Movement   ? ?   Right Left  ?  Full Full  ? ?  ?  ? ? Neuro/Psych   ? ? Oriented x3: Yes  ? Mood/Affect: Normal  ? ?  ?  ? ? Dilation   ? ? Right eye: 1.0% Mydriacyl, 2.5% Phenylephrine @ 3:38 PM  ? ?  ?  ? ?  ? ?Slit Lamp and Fundus Exam   ? ? External Exam   ? ?   Right Left  ? External Normal Normal  ? ?  ?  ? ? Slit Lamp Exam   ? ?   Right Left  ? Lids/Lashes Normal Normal  ? Conjunctiva/Sclera White and quiet White and quiet  ? Cornea Stromal fine reticulated and punctate opacities, ?  Lattice degeneration, no epithelial involvement Stromal fine reticulated and punctate opacities, ?  Lattice degeneration, no epithelial involvement  ? Anterior Chamber Deep and quiet Deep and quiet  ? Iris Round and reactive Round and reactive  ? Lens Posterior chamber intraocular lens Posterior chamber intraocular lens  ? Anterior Vitreous Normal Normal  ? ?  ?  ? ? Fundus Exam   ? ?   Right Left  ? Posterior Vitreous Posterior vitreous detachment   ? Disc Normal   ? C/D Ratio 0.3   ? Macula Epiretinal membrane, good focal laser, Macular thickening minor   ? Vessels PDR-quiet   ? Periphery good PRP, room for anterior PRP present temporally and nasally and superiorly   ? ?  ?  ? ?  ? ? ?IMAGING AND PROCEDURES  ?Imaging and Procedures for 12/24/21 ? ?Intravitreal Injection, Pharmacologic Agent - OD - Right Eye   ? ?   ?Time Out ?12/24/2021. 4:29 PM. Confirmed correct patient, procedure, site, and patient consented.  ? ?Anesthesia ?Topical anesthesia was used. Anesthetic medications included Lidocaine 4%.  ? ?Procedure ?Preparation included 5% betadine  to ocular surface, 10% betadine to eyelids, Tobramycin 0.3%. A 30 gauge needle was  used.  ? ?Injection: ?2 mg aflibercept 2 MG/0.05ML ?  Route: Intravitreal, Site: Right Eye ?  [place]: [id number], Lot: [id number], Waste: 0 mL  ? ?Post-op ?Post injection exam found visual acuity of at least counting fingers. The patient tolerated the procedure well. There were no complications. The patient received written and verbal post procedure care education. Post injection medications included ocuflox.  ? ?  ? ?OCT, Retina - OU - Both Eyes   ? ?   ?Right Eye ?Quality was good. Scan locations included subfoveal. Central Foveal Thickness: 319. Progression has been stable. Findings include abnormal foveal contour, epiretinal membrane.  ? ?Left Eye ?Quality was good. Scan locations included subfoveal. Central Foveal Thickness: 259. Progression has improved. Findings include abnormal foveal contour.  ? ?Notes ?OS decreased CSME at 5-day interval.  Stable left eye, media opacity from corneal degeneration, after recent injection OS ? ?OD 6 weeks post most recent injection looks great, with no recurrence of macular edema OD, with epiretinal membrane nasal portion of macula OD, stable over the last 6 weeks OD and will maintain 6-week interval to protect best acuity in her best  eye ? ?  ? ? ?  ?  ? ?  ?ASSESSMENT/PLAN: ? ?Proliferative diabetic retinopathy of left eye with macular edema associated with type 2 diabetes mellitus ([place]) ?Recent CSME temporally, improved 5 days post most recent injection, with accompanying visual acuity symptomatically improved per patient.  Follow-up as scheduled next ? ?Diabetic macular edema of right eye with proliferative retinopathy associated with type 2 diabetes mellitus ([place]) ?OD, with diabetic CSME stable now.  Able to maintain resolution of CSME at 6-week interval probably because of concomitant use of CPAP  ? ?  ICD-10-CM   ?1. Diabetic macular edema of right eye with proliferative retinopathy  associated with type 2 diabetes mellitus ([facility])  E11.3511 Intravitreal Injection, Pharmacologic Agent - OD - Right Eye  ?  OCT, Retina - OU - Both Eyes  ?  aflibercept (EYLEA) SOLN 2 mg  ?  ?2. Proliferative diab

## 2021-12-24 NOTE — Assessment & Plan Note (Signed)
OD, with diabetic CSME stable now.  Able to maintain resolution of CSME at 6-week interval probably because of concomitant use of CPAP ?

## 2021-12-24 NOTE — Assessment & Plan Note (Signed)
Recent CSME temporally, improved 5 days post most recent injection, with accompanying visual acuity symptomatically improved per patient.  Follow-up as scheduled next ?

## 2021-12-26 ENCOUNTER — Encounter: Payer: Self-pay | Admitting: Internal Medicine

## 2022-01-22 ENCOUNTER — Ambulatory Visit (INDEPENDENT_AMBULATORY_CARE_PROVIDER_SITE_OTHER): Payer: Medicare Other | Admitting: Neurology

## 2022-01-22 ENCOUNTER — Encounter: Payer: Self-pay | Admitting: Neurology

## 2022-01-22 VITALS — BP 161/72 | HR 78 | Ht 64.0 in | Wt 168.0 lb

## 2022-01-22 DIAGNOSIS — G4733 Obstructive sleep apnea (adult) (pediatric): Secondary | ICD-10-CM | POA: Diagnosis not present

## 2022-01-22 DIAGNOSIS — Z9989 Dependence on other enabling machines and devices: Secondary | ICD-10-CM

## 2022-01-22 DIAGNOSIS — E0842 Diabetes mellitus due to underlying condition with diabetic polyneuropathy: Secondary | ICD-10-CM

## 2022-01-22 MED ORDER — DULOXETINE HCL 30 MG PO CPEP: 30.0000 mg | ORAL_CAPSULE | Freq: Every day | ORAL | 3 refills | Status: DC

## 2022-01-22 NOTE — Progress Notes (Signed)
PATIENT: Grace [name] DOB: Sep 27, 1950  REASON FOR VISIT: follow up for CPAP, neuropathy  HISTORY FROM: patient PRIMARY NEUROLOGIST: Willis for neuropathy/ Dohmeier for sleep  HISTORY OF PRESENT ILLNESS: Today 01/22/22  [name] is here today for follow-up. Takes Cymbalta 30 mg daily for neuropathy. Not a lot of pain, sleeps well. Using walker, bilateral AFOs. No falls. Lives with husband, she doesn't drive mostly due to vision. Sees Dr. [name] for macular edema, diabetic retinopathy.  Recent A1c was 7.5.  Dr. [name] feels CPAP use has provided stability for her vision issues.  She personally has not experienced any benefit with CPAP use.  She would like to discontinue it if possible.  Continues with daytime drowsiness, but feels mostly related to lack of activity, not able to do much with chronic illness.  Uses.  ESS was 10, review of CPAP download is overall good, there are a few nights she had 0 usage due to traveling.  Her AHI is 6.8.   01/22/2021 SS: [name] is a 71 year old female with history of diabetes and peripheral neuropathy.  Doing well on low-dose Cymbalta. On CPAP, visit in October 2021 showed excellent compliance data. A1C 7.5 recently. Neuropathy pain is well controlled, doesn't bother her at all. Getting around well, no falls. Numbness to both feet, no sharp pains. Has bilateral leg braces. Seeing Dr. [name], he felt the CPAP would be helpful for retinopathy, there has been improvement, she can't tell any difference. The CPAP is bothersome to her. Uses 4-5 hours a night. Switched to nasal pillows, some better. The main drive for using CPAP is to improve vision. Hasn't driven in 2 years due to vision, her husband drives her around. ESS 10.  Review of recent CPAP download, indicates excellent compliance, AHI is well treated 3.2, leak is 10.7.     REVIEW OF SYSTEMS: Out of a complete 14 system review of symptoms, the patient complains only of the following  symptoms, and all other reviewed systems are negative.  See HPI  ALLERGIES: Allergies  Allergen Reactions   Gabapentin Swelling    Swelling in legs and feet    HOME MEDICATIONS: Outpatient Medications Prior to Visit  Medication Sig Dispense Refill   acetaminophen (TYLENOL) 500 MG tablet Take 1,500 mg by mouth every 8 (eight) hours as needed for moderate pain.     Calcium 200 MG TABS Take 400 mg by mouth daily.     Cholecalciferol (DIALYVITE VITAMIN D 5000) 125 MCG (5000 UT) capsule Take 5,000 Units by mouth daily.     DULoxetine (CYMBALTA) 30 MG capsule Take 1 capsule (30 mg total) by mouth daily. 90 capsule 3   glimepiride (AMARYL) 2 MG tablet Take  1 tablet  2 x /day  with Meals  for Diabetes (Patient taking differently: Take 2 mg by mouth daily.) 180 tablet 3   glucose blood (ACCU-CHEK GUIDE) test strip Check blood sugar 3 times a day-DX-E11.69 AND E78.5. 100 each 12   Magnesium 250 MG TABS Take 500 mg by mouth daily.     metFORMIN (GLUCOPHAGE-XR) 500 MG 24 hr tablet TAKE 2 TABLETS TWICE DAILY FOR DIABETES 360 tablet 1   Omega-3 350 MG CPDR Take 350 mg by mouth 2 (two) times daily.     vitamin C (ASCORBIC ACID) 500 MG tablet Take 500 mg by mouth daily.     zinc gluconate 50 MG tablet Take 50 mg by mouth daily.     No facility-administered medications prior to  visit.    PAST MEDICAL HISTORY: Past Medical History:  Diagnosis Date   Abnormality of gait 07/28/2013   Chronic kidney disease    Diabetes ([facility])    Diabetic peripheral neuropathy ([facility])    Diabetic retinopathy ([place])    Difficult intubation    Foot drop, bilateral 07/31/2014   GERD (gastroesophageal reflux disease)    Peripheral edema    Polyneuropathy in diabetes(357.2) 07/28/2013   Sleep apnea    uses cpap   Tongue mass     PAST SURGICAL HISTORY: Past Surgical History:  Procedure Laterality Date   ABDOMINAL HYSTERECTOMY     ANKLE FRACTURE SURGERY     left   CATARACT EXTRACTION W/PHACO Right 02/21/2014    Dr. Herbert Deaner   CATARACT EXTRACTION W/PHACO Left 04/10/2014   Dr. Herbert Deaner   CATARACT EXTRACTION, BILATERAL     DIRECT LARYNGOSCOPY Left 08/02/2021   Procedure: DIRECT LARYNGOSCOPY WITH TONGUE BIOPSY;  Surgeon: Leta Baptist, MD;  Location: [facility];  Service: ENT;  Laterality: Left;   HIP SURGERY     right   MASS BIOPSY Left 09/06/2021   Procedure: NECK MASS BIOPSY;  Surgeon: Leta Baptist, MD;  Location: [facility];  Service: ENT;  Laterality: Left;   TONSILLECTOMY      FAMILY HISTORY: Family History  Problem Relation Age of Onset   Diabetes Mother    Breast cancer Maternal Aunt    Diabetes Brother    Neuropathy Neg Hx     SOCIAL HISTORY: Social History   Socioeconomic History   Marital status: Married    Spouse name: Not on file   Number of children: 2   Years of education: college   Highest education level: Not on file  Occupational History   Occupation: part time  Tobacco Use   Smoking status: Never   Smokeless tobacco: Never  Substance and Sexual Activity   Alcohol use: No   Drug use: No   Sexual activity: Not on file  Other Topics Concern   Not on file  Social History Narrative   Patient is right handed.   Patient drinks about 4 cups of tea daily.   Social Determinants of Health   Financial Resource Strain: Not on file  Food Insecurity: Not on file  Transportation Needs: Not on file  Physical Activity: Not on file  Stress: Not on file  Social Connections: Not on file  Intimate Partner Violence: Not on file   PHYSICAL EXAM  Vitals:   01/22/22 1119  BP: (!) 161/72  Pulse: 78  Weight: 168 lb (76.2 kg)  Height: '5\' 4"'$  (1.626 m)    Body mass index is 28.84 kg/m.  Generalized: Well developed, in no acute distress   Neurological examination  Mentation: Alert oriented to time, place, history taking. Follows all commands speech and language fluent Cranial nerve II-XII: Pupils were equal round reactive to light. Extraocular  movements were full, visual field were full on confrontational test. Facial sensation and strength were normal.  Head turning and shoulder shrug were normal and symmetric. Motor: The motor testing reveals 5 over 5 strength of all 4 extremities. Good symmetric motor tone is noted throughout. Braces bilateral LE Sensory: Sensory testing is intact to soft touch on all 4 extremities. No evidence of extinction is noted.  Coordination: Cerebellar testing reveals good finger-nose-finger and heel-to-shin bilaterally.  Gait and station: Gait is slightly wide-based, is cautious, uses walker in hallway Reflexes: Deep tendon reflexes are symmetric but are depressed throughout  DIAGNOSTIC DATA (LABS, IMAGING, TESTING) - I reviewed patient records, labs, notes, testing and imaging myself where available.  Lab Results  Component Value Date   WBC 7.7 09/11/2021   HGB 11.8 09/11/2021   HCT 35.5 09/11/2021   MCV 95.4 09/11/2021   PLT 405 (H) 09/11/2021      Component Value Date/Time   NA 140 09/11/2021 1120   K 5.2 09/11/2021 1120   CL 100 09/11/2021 1120   CO2 35 (H) 09/11/2021 1120   GLUCOSE 123 (H) 09/11/2021 1120   BUN 23 09/11/2021 1120   CREATININE 0.94 09/11/2021 1120   CALCIUM 10.1 09/11/2021 1120   PROT 6.1 09/11/2021 1120   ALBUMIN 3.8 01/27/2017 1609   AST 15 09/11/2021 1120   ALT 15 09/11/2021 1120   ALKPHOS 69 01/27/2017 1609   BILITOT 0.2 09/11/2021 1120   GFRNONAA >60 09/04/2021 1430   GFRNONAA 59 (L) 12/05/2020 0951   GFRAA 69 12/05/2020 0951   Lab Results  Component Value Date   CHOL 168 09/11/2021   HDL 55 09/11/2021   LDLCALC 84 09/11/2021   TRIG 191 (H) 09/11/2021   CHOLHDL 3.1 09/11/2021   Lab Results  Component Value Date   HGBA1C 7.9 (H) 09/11/2021   No results found for: VITAMINB12 Lab Results  Component Value Date   TSH 1.62 06/10/2021   ASSESSMENT AND PLAN 71 y.o. year old female  has a past medical history of Abnormality of gait (07/28/2013), Chronic  kidney disease, Diabetes ([place]), Diabetic peripheral neuropathy ([place]), Diabetic retinopathy ([place]), Difficult intubation, Foot drop, bilateral (07/31/2014), GERD (gastroesophageal reflux disease), Peripheral edema, Polyneuropathy in diabetes(357.2) (07/28/2013), Sleep apnea, and Tongue mass. here with:  1.  Diabetic peripheral neuropathy -Under good control, continue Cymbalta 30 mg daily, this can come from PCP in future  2.  OSA on CPAP -Download shows good compliance, but could improve on greater than 4-hour usage, and every day usage -She will discuss discontinuing with Dr. [name], review of his notes indicate he sees benefit with her continued CPAP use to her macular edema/diabetic retinopathy -in June 2021 sleep study showed mild sleep apnea with loud snoring, felt this mild sleep apnea should be treated with CPAP given snoring, EDS complaint -If she continues, I will re-order supplies, check a download in 3 months to make sure AHI is < 5  Butler Denmark, [place], DNP 01/22/2022, 11:24 AM [facility] [address] [place], [place] [address] [phone]

## 2022-01-22 NOTE — Patient Instructions (Signed)
Discuss with Dr. [name] about CPAP, continue Cymbalta  See you back in 1 year

## 2022-01-30 ENCOUNTER — Ambulatory Visit (INDEPENDENT_AMBULATORY_CARE_PROVIDER_SITE_OTHER): Payer: Medicare Other | Admitting: Ophthalmology

## 2022-01-30 ENCOUNTER — Encounter (INDEPENDENT_AMBULATORY_CARE_PROVIDER_SITE_OTHER): Payer: Self-pay | Admitting: Ophthalmology

## 2022-01-30 DIAGNOSIS — G4733 Obstructive sleep apnea (adult) (pediatric): Secondary | ICD-10-CM

## 2022-01-30 DIAGNOSIS — Z9989 Dependence on other enabling machines and devices: Secondary | ICD-10-CM | POA: Diagnosis not present

## 2022-01-30 DIAGNOSIS — H35372 Puckering of macula, left eye: Secondary | ICD-10-CM | POA: Diagnosis not present

## 2022-01-30 DIAGNOSIS — E113511 Type 2 diabetes mellitus with proliferative diabetic retinopathy with macular edema, right eye: Secondary | ICD-10-CM

## 2022-01-30 DIAGNOSIS — E113512 Type 2 diabetes mellitus with proliferative diabetic retinopathy with macular edema, left eye: Secondary | ICD-10-CM

## 2022-01-30 MED ORDER — AFLIBERCEPT 2MG/0.05ML IZ SOLN FOR KALEIDOSCOPE
2.0000 mg | INTRAVITREAL | Status: AC | PRN
  Administered 2022-01-30: 2 mg via INTRAVITREAL

## 2022-01-30 NOTE — Assessment & Plan Note (Signed)
Today at 6-week follow-up interval Eylea, control CSME at this juncture.  Patient has used CPAP in conjunction nightly until tonight and she will henceforth discontinue CPAP on her own as a trial and whether she can safely do so

## 2022-01-30 NOTE — Progress Notes (Signed)
01/30/2022     CHIEF COMPLAINT Patient presents for  Chief Complaint  Patient presents with   Diabetic Retinopathy with Macular Edema      HISTORY OF PRESENT ILLNESS: Grace [name] is a 71 y.o. female who presents to the clinic today for:   HPI   6 weeks for DILATE, OS, EYLEA, OCT. Pt stated vision has been stable since last visit.   Last edited by Silvestre Moment on 01/30/2022  3:36 PM.      Referring physician: Unk Pinto, MD [address] [place],  [place] [address]  HISTORICAL INFORMATION:   Selected notes from the MEDICAL RECORD NUMBER    Lab Results  Component Value Date   HGBA1C 7.9 (H) 09/11/2021     CURRENT MEDICATIONS: No current outpatient medications on file. (Ophthalmic Drugs)   No current facility-administered medications for this visit. (Ophthalmic Drugs)   Current Outpatient Medications (Other)  Medication Sig   acetaminophen (TYLENOL) 500 MG tablet Take 1,500 mg by mouth every 8 (eight) hours as needed for moderate pain.   Calcium 200 MG TABS Take 400 mg by mouth daily.   Cholecalciferol (DIALYVITE VITAMIN D 5000) 125 MCG (5000 UT) capsule Take 5,000 Units by mouth daily.   DULoxetine (CYMBALTA) 30 MG capsule Take 1 capsule (30 mg total) by mouth daily.   glimepiride (AMARYL) 2 MG tablet Take  1 tablet  2 x /day  with Meals  for Diabetes (Patient taking differently: Take 2 mg by mouth daily.)   glucose blood (ACCU-CHEK GUIDE) test strip Check blood sugar 3 times a day-DX-E11.69 AND E78.5.   Magnesium 250 MG TABS Take 500 mg by mouth daily.   metFORMIN (GLUCOPHAGE-XR) 500 MG 24 hr tablet TAKE 2 TABLETS TWICE DAILY FOR DIABETES   Omega-3 350 MG CPDR Take 350 mg by mouth 2 (two) times daily.   vitamin C (ASCORBIC ACID) 500 MG tablet Take 500 mg by mouth daily.   zinc gluconate 50 MG tablet Take 50 mg by mouth daily.   No current facility-administered medications for this visit. (Other)      REVIEW OF SYSTEMS: ROS    Negative for: Constitutional, Gastrointestinal, Neurological, Skin, Genitourinary, Musculoskeletal, HENT, Endocrine, Cardiovascular, Eyes, Respiratory, Psychiatric, Allergic/Imm, Heme/Lymph Last edited by Silvestre Moment on 01/30/2022  3:36 PM.       ALLERGIES Allergies  Allergen Reactions   Gabapentin Swelling    Swelling in legs and feet    PAST MEDICAL HISTORY Past Medical History:  Diagnosis Date   Abnormality of gait 07/28/2013   Chronic kidney disease    Diabetes ([place])    Diabetic peripheral neuropathy (HCC)    Diabetic retinopathy ([place])    Difficult intubation    Foot drop, bilateral 07/31/2014   GERD (gastroesophageal reflux disease)    Peripheral edema    Polyneuropathy in diabetes(357.2) 07/28/2013   Sleep apnea    uses cpap   Tongue mass    Past Surgical History:  Procedure Laterality Date   ABDOMINAL HYSTERECTOMY     ANKLE FRACTURE SURGERY     left   CATARACT EXTRACTION W/PHACO Right 02/21/2014   Dr. Herbert Deaner   CATARACT EXTRACTION W/PHACO Left 04/10/2014   Dr. Herbert Deaner   CATARACT EXTRACTION, BILATERAL     DIRECT LARYNGOSCOPY Left 08/02/2021   Procedure: DIRECT LARYNGOSCOPY WITH TONGUE BIOPSY;  Surgeon: Leta Baptist, MD;  Location: [facility];  Service: ENT;  Laterality: Left;   HIP SURGERY     right  MASS BIOPSY Left 09/06/2021   Procedure: NECK MASS BIOPSY;  Surgeon: Leta Baptist, MD;  Location: [facility];  Service: ENT;  Laterality: Left;   TONSILLECTOMY      FAMILY HISTORY Family History  Problem Relation Age of Onset   Diabetes Mother    Breast cancer Maternal Aunt    Diabetes Brother    Neuropathy Neg Hx     SOCIAL HISTORY Social History   Tobacco Use   Smoking status: Never   Smokeless tobacco: Never  Substance Use Topics   Alcohol use: No   Drug use: No         OPHTHALMIC EXAM:  Base Eye Exam     Visual Acuity (ETDRS)       Right Left   Dist [place] 20/60 20/60 -1   Dist ph [place] 20/40 20/50 -2          Tonometry (Tonopen, 3:42 PM)       Right Left   Pressure 14 14         Pupils       Pupils APD   Right PERRL None   Left PERRL None         Visual Fields       Left Right    Full Full         Extraocular Movement       Right Left    Full Full         Neuro/Psych     Oriented x3: Yes   Mood/Affect: Normal         Dilation     Left eye: 2.5% Phenylephrine, 1.0% Mydriacyl @ 3:42 PM           Slit Lamp and Fundus Exam     External Exam       Right Left   External Normal Normal         Slit Lamp Exam       Right Left   Lids/Lashes Normal Normal   Conjunctiva/Sclera White and quiet White and quiet   Cornea Stromal fine reticulated and punctate opacities, ?  Lattice degeneration, no epithelial involvement Stromal fine reticulated and punctate opacities, ?  Lattice degeneration, no epithelial involvement   Anterior Chamber Deep and quiet Deep and quiet   Iris Round and reactive Round and reactive   Lens Posterior chamber intraocular lens Posterior chamber intraocular lens   Anterior Vitreous Normal Normal         Fundus Exam       Right Left   Posterior Vitreous  Posterior vitreous detachment   Disc  Normal   C/D Ratio  0.25   Macula  Microaneurysms, Macular thickening, Mild clinically significant macular edema   Vessels  Severe nonproliferative diabetic retinopathy with retinal nonperfusion   Periphery  Good peripheral PRP            IMAGING AND PROCEDURES  Imaging and Procedures for 01/30/22  OCT, Retina - OU - Both Eyes       Right Eye Quality was good. Scan locations included subfoveal. Central Foveal Thickness: 310. Progression has been stable. Findings include abnormal foveal contour, epiretinal membrane.   Left Eye Quality was good. Scan locations included subfoveal. Central Foveal Thickness: 392. Progression has improved. Findings include abnormal foveal contour.   Notes OS today at 6 weeks post Eylea.  Increase  CSME has returned again.  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 4:30 PM. Confirmed correct patient, procedure, site, and patient consented.   Anesthesia Topical anesthesia was used. Anesthetic medications included Lidocaine 4%.   Procedure Preparation included 5% betadine to ocular surface, 10% betadine to eyelids, Tobramycin 0.3%. A 30 gauge needle was used.   Injection: 2 mg aflibercept 2 MG/0.05ML   Route: Intravitreal, Site: Left Eye   NDC: [id number], Lot: [id number], Waste: 0 mL   Post-op Post injection exam found visual acuity of at least counting fingers. The patient tolerated the procedure well. There were no complications. The patient received written and verbal post procedure care education. Post injection medications included ocuflox.              ASSESSMENT/PLAN:  OSA on CPAP Patient is fatigued using the CPAP, having only for 2 years we will like to institute a trial of her own volition whether or not her eyes can still do as well off of the CPAP usage at night for several months.  Splane the patient that I will notify her at 5 to 6-week intervals improvement after treatment continues to work as well as and has come off of CPAP BiPAP in the near future  Proliferative diabetic retinopathy of left eye with macular edema associated with type 2 diabetes mellitus ([place]) Today at 6-week follow-up interval Eylea, control CSME at this juncture.  Patient has used CPAP in conjunction nightly until tonight and she will henceforth discontinue CPAP on her own as a trial and whether she can safely do so  Epiretinal membrane, left eye Mild OS  Diabetic macular edema of right eye with  proliferative retinopathy associated with type 2 diabetes mellitus ([place]) At 5 weeks post most recent injection, follow-up as scheduled OD     ICD-10-CM   1. Proliferative diabetic retinopathy of left eye with macular edema associated with type 2 diabetes mellitus (HCC)  D97.4163 OCT, Retina - OU - Both Eyes    Intravitreal Injection, Pharmacologic Agent - OS - Left Eye    aflibercept (EYLEA) SOLN 2 mg    2. OSA on CPAP  G47.33    Z99.89     3. Epiretinal membrane, left eye  H35.372     4. Diabetic macular edema of right eye with proliferative retinopathy associated with type 2 diabetes mellitus (HCC)  E11.3511       1.  OS, center involved CSME increased thickening in CSME again at 6 weeks.  We will repeat Eylea today to maintain and also maintain 6-week interval as patient adds an element of this continuing CPAP on her own so as to determine if she can safely do so without harming her eyes.  2.  Follow-up OD next as scheduled next week  3.  Ophthalmic Meds Ordered this visit:  Meds ordered this encounter  Medications   aflibercept (EYLEA) SOLN 2 mg       Return for dilate, OS, EYLEA OCT.  There are no Patient Instructions on file for this visit.   Explained the diagnoses, plan, and follow up with the patient and they expressed understanding.  Patient expressed understanding of the importance of proper follow up care.   Clent Demark Lynne Righi M.D. Diseases & Surgery of the Retina and Vitreous [facility] 01/30/22     Abbreviations: M myopia (nearsighted); A astigmatism; H hyperopia (farsighted); P presbyopia; Mrx spectacle prescription;  CTL contact lenses; OD right eye; OS left eye; OU both eyes  XT exotropia; ET esotropia; PEK punctate epithelial keratitis; PEE punctate epithelial erosions; DES dry eye syndrome; MGD meibomian gland dysfunction; ATs artificial tears; PFAT's preservative free artificial tears; [place] nuclear sclerotic cataract; PSC posterior  subcapsular cataract; ERM epi-retinal membrane; PVD posterior vitreous detachment; RD retinal detachment; DM diabetes mellitus; DR diabetic retinopathy; NPDR non-proliferative diabetic retinopathy; PDR proliferative diabetic retinopathy; CSME clinically significant macular edema; DME diabetic macular edema; dbh dot blot hemorrhages; CWS cotton wool spot; POAG primary open angle glaucoma; C/D cup-to-disc ratio; HVF humphrey visual field; GVF goldmann visual field; OCT optical coherence tomography; IOP intraocular pressure; BRVO Branch retinal vein occlusion; CRVO central retinal vein occlusion; CRAO central retinal artery occlusion; BRAO branch retinal artery occlusion; RT retinal tear; SB scleral buckle; PPV pars plana vitrectomy; VH Vitreous hemorrhage; PRP panretinal laser photocoagulation; IVK intravitreal kenalog; VMT vitreomacular traction; MH Macular hole;  NVD neovascularization of the disc; NVE neovascularization elsewhere; AREDS age related eye disease study; ARMD age related macular degeneration; POAG primary open angle glaucoma; EBMD epithelial/anterior basement membrane dystrophy; ACIOL anterior chamber intraocular lens; IOL intraocular lens; PCIOL posterior chamber intraocular lens; Phaco/IOL phacoemulsification with intraocular lens placement; [place] photorefractive keratectomy; LASIK laser assisted in situ keratomileusis; HTN hypertension; DM diabetes mellitus; COPD chronic obstructive pulmonary disease

## 2022-01-30 NOTE — Assessment & Plan Note (Signed)
Patient is fatigued using the CPAP, having only for 2 years we will like to institute a trial of her own volition whether or not her eyes can still do as well off of the CPAP usage at night for several months.  Splane the patient that I will notify her at 5 to 6-week intervals improvement after treatment continues to work as well as and has come off of CPAP BiPAP in the near future

## 2022-01-30 NOTE — Assessment & Plan Note (Signed)
At 5 weeks post most recent injection, follow-up as scheduled OD

## 2022-01-30 NOTE — Assessment & Plan Note (Signed)
Mild OS

## 2022-01-31 ENCOUNTER — Telehealth: Payer: Self-pay | Admitting: Neurology

## 2022-01-31 DIAGNOSIS — G4733 Obstructive sleep apnea (adult) (pediatric): Secondary | ICD-10-CM

## 2022-01-31 NOTE — Telephone Encounter (Signed)
Pt states she was asked to call after her appointment with Dr [name].  Pt is asking for a call to discuss

## 2022-02-04 ENCOUNTER — Ambulatory Visit (INDEPENDENT_AMBULATORY_CARE_PROVIDER_SITE_OTHER): Payer: Medicare Other | Admitting: Ophthalmology

## 2022-02-04 ENCOUNTER — Encounter (INDEPENDENT_AMBULATORY_CARE_PROVIDER_SITE_OTHER): Payer: Self-pay | Admitting: Ophthalmology

## 2022-02-04 DIAGNOSIS — E113511 Type 2 diabetes mellitus with proliferative diabetic retinopathy with macular edema, right eye: Secondary | ICD-10-CM

## 2022-02-04 DIAGNOSIS — E113512 Type 2 diabetes mellitus with proliferative diabetic retinopathy with macular edema, left eye: Secondary | ICD-10-CM

## 2022-02-04 MED ORDER — AFLIBERCEPT 2MG/0.05ML IZ SOLN FOR KALEIDOSCOPE
2.0000 mg | INTRAVITREAL | Status: AC | PRN
  Administered 2022-02-04: 2 mg via INTRAVITREAL

## 2022-02-04 NOTE — Telephone Encounter (Signed)
Left message for a return call

## 2022-02-04 NOTE — Progress Notes (Signed)
02/04/2022     CHIEF COMPLAINT Patient presents for  Chief Complaint  Patient presents with   Diabetic Retinopathy with Macular Edema      HISTORY OF PRESENT ILLNESS: Grace [name] is a 71 y.o. female who presents to the clinic today for:   HPI   6 weeks for DILATE OD, EYLEA, OCT. Pt stated vision has been stable since last visit. Pt denies new floaters and FOL.  Last edited by Silvestre Moment on 02/04/2022  3:56 PM.      Referring physician: Unk Pinto, MD [address] [place],  [place] [address]  HISTORICAL INFORMATION:   Selected notes from the MEDICAL RECORD NUMBER    Lab Results  Component Value Date   HGBA1C 7.9 (H) 09/11/2021     CURRENT MEDICATIONS: No current outpatient medications on file. (Ophthalmic Drugs)   No current facility-administered medications for this visit. (Ophthalmic Drugs)   Current Outpatient Medications (Other)  Medication Sig   acetaminophen (TYLENOL) 500 MG tablet Take 1,500 mg by mouth every 8 (eight) hours as needed for moderate pain.   Calcium 200 MG TABS Take 400 mg by mouth daily.   Cholecalciferol (DIALYVITE VITAMIN D 5000) 125 MCG (5000 UT) capsule Take 5,000 Units by mouth daily.   DULoxetine (CYMBALTA) 30 MG capsule Take 1 capsule (30 mg total) by mouth daily.   glimepiride (AMARYL) 2 MG tablet Take  1 tablet  2 x /day  with Meals  for Diabetes (Patient taking differently: Take 2 mg by mouth daily.)   glucose blood (ACCU-CHEK GUIDE) test strip Check blood sugar 3 times a day-DX-E11.69 AND E78.5.   Magnesium 250 MG TABS Take 500 mg by mouth daily.   metFORMIN (GLUCOPHAGE-XR) 500 MG 24 hr tablet TAKE 2 TABLETS TWICE DAILY FOR DIABETES   Omega-3 350 MG CPDR Take 350 mg by mouth 2 (two) times daily.   vitamin C (ASCORBIC ACID) 500 MG tablet Take 500 mg by mouth daily.   zinc gluconate 50 MG tablet Take 50 mg by mouth daily.   No current facility-administered medications for this visit. (Other)       REVIEW OF SYSTEMS: ROS   Negative for: Constitutional, Gastrointestinal, Neurological, Skin, Genitourinary, Musculoskeletal, HENT, Endocrine, Cardiovascular, Eyes, Respiratory, Psychiatric, Allergic/Imm, Heme/Lymph Last edited by Silvestre Moment on 02/04/2022  3:56 PM.       ALLERGIES Allergies  Allergen Reactions   Gabapentin Swelling    Swelling in legs and feet    PAST MEDICAL HISTORY Past Medical History:  Diagnosis Date   Abnormality of gait 07/28/2013   Chronic kidney disease    Diabetes ([place])    Diabetic peripheral neuropathy (HCC)    Diabetic retinopathy ([place])    Difficult intubation    Foot drop, bilateral 07/31/2014   GERD (gastroesophageal reflux disease)    Peripheral edema    Polyneuropathy in diabetes(357.2) 07/28/2013   Sleep apnea    uses cpap   Tongue mass    Past Surgical History:  Procedure Laterality Date   ABDOMINAL HYSTERECTOMY     ANKLE FRACTURE SURGERY     left   CATARACT EXTRACTION W/PHACO Right 02/21/2014   Dr. Herbert Deaner   CATARACT EXTRACTION W/PHACO Left 04/10/2014   Dr. Herbert Deaner   CATARACT EXTRACTION, BILATERAL     DIRECT LARYNGOSCOPY Left 08/02/2021   Procedure: DIRECT LARYNGOSCOPY WITH TONGUE BIOPSY;  Surgeon: Leta Baptist, MD;  Location: [facility];  Service: ENT;  Laterality: Left;   HIP SURGERY  right   MASS BIOPSY Left 09/06/2021   Procedure: NECK MASS BIOPSY;  Surgeon: Leta Baptist, MD;  Location: [facility];  Service: ENT;  Laterality: Left;   TONSILLECTOMY      FAMILY HISTORY Family History  Problem Relation Age of Onset   Diabetes Mother    Breast cancer Maternal Aunt    Diabetes Brother    Neuropathy Neg Hx     SOCIAL HISTORY Social History   Tobacco Use   Smoking status: Never   Smokeless tobacco: Never  Substance Use Topics   Alcohol use: No   Drug use: No         OPHTHALMIC EXAM:  Base Eye Exam     Visual Acuity (ETDRS)       Right Left   Dist cc 20/30 +2 20/60   Dist ph cc   20/50 -2    Correction: Glasses         Tonometry (Tonopen, 4:01 PM)       Right Left   Pressure 16 15         Pupils       Pupils APD   Right PERRL None   Left PERRL None         Visual Fields       Left Right    Full Full         Extraocular Movement       Right Left    Full Full         Neuro/Psych     Oriented x3: Yes   Mood/Affect: Normal         Dilation     Right eye: 2.5% Phenylephrine, 1.0% Mydriacyl @ 4:01 PM           Slit Lamp and Fundus Exam     External Exam       Right Left   External Normal Normal         Slit Lamp Exam       Right Left   Lids/Lashes Normal Normal   Conjunctiva/Sclera White and quiet White and quiet   Cornea Stromal fine reticulated and punctate opacities, ?  Lattice degeneration, no epithelial involvement Stromal fine reticulated and punctate opacities, ?  Lattice degeneration, no epithelial involvement   Anterior Chamber Deep and quiet Deep and quiet   Iris Round and reactive Round and reactive   Lens Posterior chamber intraocular lens Posterior chamber intraocular lens   Anterior Vitreous Normal Normal         Fundus Exam       Right Left   Posterior Vitreous Posterior vitreous detachment    Disc Normal    C/D Ratio 0.3    Macula Epiretinal membrane, good focal laser, Macular thickening minor    Vessels PDR-quiet    Periphery good PRP, room for anterior PRP present temporally and nasally and superiorly             IMAGING AND PROCEDURES  Imaging and Procedures for 02/04/22  OCT, Retina - OU - Both Eyes       Right Eye Quality was good. Scan locations included subfoveal. Central Foveal Thickness: 321. Progression has been stable. Findings include abnormal foveal contour, epiretinal membrane.   Left Eye Quality was good. Scan locations included subfoveal. Central Foveal Thickness: 302. Progression has improved. Findings include abnormal foveal contour.   Notes OS today at 1  weeks post Eylea.  Decreased CSME restored.  Follow-up OS as scheduled  OD stable, improved CSME and maintained today at 6-week interval.        Intravitreal Injection, Pharmacologic Agent - OD - Right Eye       Time Out 02/04/2022. 4:55 PM. Confirmed correct patient, procedure, site, and patient consented.   Anesthesia Topical anesthesia was used. Anesthetic medications included Lidocaine 4%.   Procedure Preparation included 5% betadine to ocular surface, 10% betadine to eyelids, Tobramycin 0.3%. A 30 gauge needle was used.   Injection: 2 mg aflibercept 2 MG/0.05ML   Route: Intravitreal, Site: Right Eye   NDC: [id number], Lot: [id number], Waste: 0 mL   Post-op Post injection exam found visual acuity of at least counting fingers. The patient tolerated the procedure well. There were no complications. The patient received written and verbal post procedure care education. Post injection medications included ocuflox.              ASSESSMENT/PLAN:  Diabetic macular edema of right eye with proliferative retinopathy associated with type 2 diabetes mellitus (HCC) Off CPAP, at 6-week interval today, doing well.  Proliferative diabetic retinopathy of left eye with macular edema associated with type 2 diabetes mellitus (HCC) Improved OS today 5 days after recent     ICD-10-CM   1. Diabetic macular edema of right eye with proliferative retinopathy associated with type 2 diabetes mellitus (HCC)  E11.3511 OCT, Retina - OU - Both Eyes    Intravitreal Injection, Pharmacologic Agent - OD - Right Eye    aflibercept (EYLEA) SOLN 2 mg    2. Proliferative diabetic retinopathy of left eye with macular edema associated with type 2 diabetes mellitus ([place])  J62.8366       1.  OD center involved CSME remained stable.  2.  OS improved overall 5 days post most recent injection  3.  OSA, patient initiated halt of CPAP therapy.  We will continue to monitor to look for persistence under  resistance to antivegF therapy  4.  PDR OU, stable no active disease  Ophthalmic Meds Ordered this visit:  Meds ordered this encounter  Medications   aflibercept (EYLEA) SOLN 2 mg       Return in about 6 weeks (around 03/18/2022) for dilate, OD, EYLEA OCT, and follow-up OS as scheduled.  There are no Patient Instructions on file for this visit.   Explained the diagnoses, plan, and follow up with the patient and they expressed understanding.  Patient expressed understanding of the importance of proper follow up care.   Clent Demark Desiraye Rolfson M.D. Diseases & Surgery of the Retina and Vitreous [facility] 02/04/22     Abbreviations: M myopia (nearsighted); A astigmatism; H hyperopia (farsighted); P presbyopia; Mrx spectacle prescription;  CTL contact lenses; OD right eye; OS left eye; OU both eyes  XT exotropia; ET esotropia; PEK punctate epithelial keratitis; PEE punctate epithelial erosions; DES dry eye syndrome; MGD meibomian gland dysfunction; ATs artificial tears; PFAT's preservative free artificial tears; [place] nuclear sclerotic cataract; PSC posterior subcapsular cataract; ERM epi-retinal membrane; PVD posterior vitreous detachment; RD retinal detachment; DM diabetes mellitus; DR diabetic retinopathy; NPDR non-proliferative diabetic retinopathy; PDR proliferative diabetic retinopathy; CSME clinically significant macular edema; DME diabetic macular edema; dbh dot blot hemorrhages; CWS cotton wool spot; POAG primary open angle glaucoma; C/D cup-to-disc ratio; HVF humphrey visual field; GVF goldmann visual field; OCT optical coherence tomography; IOP intraocular pressure; BRVO Branch retinal vein occlusion; CRVO central retinal vein occlusion; CRAO central retinal artery occlusion; BRAO branch retinal artery occlusion; RT retinal tear; SB scleral  buckle; PPV pars plana vitrectomy; VH Vitreous hemorrhage; PRP panretinal laser photocoagulation; IVK intravitreal kenalog; VMT  vitreomacular traction; MH Macular hole;  NVD neovascularization of the disc; NVE neovascularization elsewhere; AREDS age related eye disease study; ARMD age related macular degeneration; POAG primary open angle glaucoma; EBMD epithelial/anterior basement membrane dystrophy; ACIOL anterior chamber intraocular lens; IOL intraocular lens; PCIOL posterior chamber intraocular lens; Phaco/IOL phacoemulsification with intraocular lens placement; [place] photorefractive keratectomy; LASIK laser assisted in situ keratomileusis; HTN hypertension; DM diabetes mellitus; COPD chronic obstructive pulmonary disease

## 2022-02-04 NOTE — Telephone Encounter (Signed)
I spoke with the patient. Dr. [name] advised her that it was okay to go off of CPAP. She will discontinue it for the summer and then be reevaluated. She has discontinued the CPAP and states she is sleeping much better.

## 2022-02-04 NOTE — Assessment & Plan Note (Signed)
Improved OS today 5 days after recent

## 2022-02-04 NOTE — Telephone Encounter (Signed)
Events noted. Sleep study is attached below 01/09/2020. I had already reviewed her sleep study prior, discussed the risks, she wishes to remain off, we can re-evaluate at next office visit.    Summary & Diagnosis:      Mild sleep apnea at AHI of 8.9/h and REM AHI of 12.8/h of sleep was recorded. Loud snoring was indicated by RDI and vibraphone.  No clinically significant associated oxygen desaturations and no supine sleep were recorded. REM sleep was seen after 1 hour of sleep latency and comprised overall a smaller than normal proportion of the sleep architecture (7%).    Recommendations:       Even this mild sleep apnea should be treated with CPAP, given the associated snoring and the EDS complaint. I will order an auto titrating CPAP device with humidification and pressure settings form 6-15 cm water, 3 cm EPR and mask of the patient's choice- side sleeper friendly.

## 2022-02-04 NOTE — Assessment & Plan Note (Signed)
Off CPAP, at 6-week interval today, doing well.

## 2022-02-05 NOTE — Addendum Note (Signed)
Addended by: Desmond Lope on: 02/05/2022 02:31 PM   Modules accepted: Orders

## 2022-02-05 NOTE — Telephone Encounter (Signed)
Left second message asking for a return call.

## 2022-02-05 NOTE — Telephone Encounter (Signed)
I spoke to the patient. She still wishes to remain off CPAP for now. Feels she is sleeping better. Discontinuation orders placed in Epic and message has been sent to [facility].

## 2022-02-05 NOTE — Telephone Encounter (Signed)
Pt is asking for a call back from Michelle, RN 

## 2022-03-13 ENCOUNTER — Ambulatory Visit (INDEPENDENT_AMBULATORY_CARE_PROVIDER_SITE_OTHER): Payer: Medicare Other | Admitting: Ophthalmology

## 2022-03-13 ENCOUNTER — Encounter (INDEPENDENT_AMBULATORY_CARE_PROVIDER_SITE_OTHER): Payer: Self-pay | Admitting: Ophthalmology

## 2022-03-13 DIAGNOSIS — Z9989 Dependence on other enabling machines and devices: Secondary | ICD-10-CM

## 2022-03-13 DIAGNOSIS — G4733 Obstructive sleep apnea (adult) (pediatric): Secondary | ICD-10-CM | POA: Diagnosis not present

## 2022-03-13 DIAGNOSIS — E113512 Type 2 diabetes mellitus with proliferative diabetic retinopathy with macular edema, left eye: Secondary | ICD-10-CM

## 2022-03-13 DIAGNOSIS — E113511 Type 2 diabetes mellitus with proliferative diabetic retinopathy with macular edema, right eye: Secondary | ICD-10-CM

## 2022-03-13 MED ORDER — AFLIBERCEPT 2MG/0.05ML IZ SOLN FOR KALEIDOSCOPE
2.0000 mg | INTRAVITREAL | Status: AC | PRN
  Administered 2022-03-13: 2 mg via INTRAVITREAL

## 2022-03-13 NOTE — Assessment & Plan Note (Signed)
At 5-week interval today, doing well

## 2022-03-13 NOTE — Progress Notes (Signed)
03/13/2022     CHIEF COMPLAINT Patient presents for  Chief Complaint  Patient presents with   Diabetic Retinopathy with Macular Edema      HISTORY OF PRESENT ILLNESS: Grace [name] is a 71 y.o. female who presents to the clinic today for:   HPI   6 week OS EYLEA OCT  Pt states her vision has been stable  Pt denies any new floaters or FOL  Last edited by Morene Rankins, CMA on 03/13/2022  4:05 PM.      Referring physician: Unk Pinto, MD [address] [place],   [address]  HISTORICAL INFORMATION:   Selected notes from the [facility]    Lab Results  Component Value Date   HGBA1C 7.9 (H) 09/11/2021     CURRENT MEDICATIONS: No current outpatient medications on file. (Ophthalmic Drugs)   No current facility-administered medications for this visit. (Ophthalmic Drugs)   Current Outpatient Medications (Other)  Medication Sig   acetaminophen (TYLENOL) 500 MG tablet Take 1,500 mg by mouth every 8 (eight) hours as needed for moderate pain.   Calcium 200 MG TABS Take 400 mg by mouth daily.   Cholecalciferol (DIALYVITE VITAMIN D 5000) 125 MCG (5000 UT) capsule Take 5,000 Units by mouth daily.   DULoxetine (CYMBALTA) 30 MG capsule Take 1 capsule (30 mg total) by mouth daily.   glimepiride (AMARYL) 2 MG tablet Take  1 tablet  2 x /day  with Meals  for Diabetes (Patient taking differently: Take 2 mg by mouth daily.)   glucose blood (ACCU-CHEK GUIDE) test strip Check blood sugar 3 times a day-DX-E11.69 AND E78.5.   Magnesium 250 MG TABS Take 500 mg by mouth daily.   metFORMIN (GLUCOPHAGE-XR) 500 MG 24 hr tablet TAKE 2 TABLETS TWICE DAILY FOR DIABETES   Omega-3 350 MG CPDR Take 350 mg by mouth 2 (two) times daily.   vitamin C (ASCORBIC ACID) 500 MG tablet Take 500 mg by mouth daily.   zinc gluconate 50 MG tablet Take 50 mg by mouth daily.   No current facility-administered medications for this visit. (Other)      REVIEW OF  SYSTEMS: ROS   Negative for: Constitutional, Gastrointestinal, Neurological, Skin, Genitourinary, Musculoskeletal, HENT, Endocrine, Cardiovascular, Eyes, Respiratory, Psychiatric, Allergic/Imm, Heme/Lymph Last edited by Orene Desanctis D, CMA on 03/13/2022  4:05 PM.       ALLERGIES Allergies  Allergen Reactions   Gabapentin Swelling    Swelling in legs and feet    PAST MEDICAL HISTORY Past Medical History:  Diagnosis Date   Abnormality of gait 07/28/2013   Chronic kidney disease    Diabetes ([place])    Diabetic peripheral neuropathy (HCC)    Diabetic retinopathy ([place])    Difficult intubation    Foot drop, bilateral 07/31/2014   GERD (gastroesophageal reflux disease)    Peripheral edema    Polyneuropathy in diabetes(357.2) 07/28/2013   Sleep apnea    uses cpap   Tongue mass    Past Surgical History:  Procedure Laterality Date   ABDOMINAL HYSTERECTOMY     ANKLE FRACTURE SURGERY     left   CATARACT EXTRACTION W/PHACO Right 02/21/2014   Dr. Herbert Deaner   CATARACT EXTRACTION W/PHACO Left 04/10/2014   Dr. Herbert Deaner   CATARACT EXTRACTION, BILATERAL     DIRECT LARYNGOSCOPY Left 08/02/2021   Procedure: DIRECT LARYNGOSCOPY WITH TONGUE BIOPSY;  Surgeon: Leta Baptist, MD;  Location: [facility];  Service: ENT;  Laterality: Left;   HIP  SURGERY     right   MASS BIOPSY Left 09/06/2021   Procedure: NECK MASS BIOPSY;  Surgeon: Leta Baptist, MD;  Location: [facility];  Service: ENT;  Laterality: Left;   TONSILLECTOMY      FAMILY HISTORY Family History  Problem Relation Age of Onset   Diabetes Mother    Breast cancer Maternal Aunt    Diabetes Brother    Neuropathy Neg Hx     SOCIAL HISTORY Social History   Tobacco Use   Smoking status: Never   Smokeless tobacco: Never  Substance Use Topics   Alcohol use: No   Drug use: No         OPHTHALMIC EXAM:  Base Eye Exam     Visual Acuity (ETDRS)       Right Left   Dist [place] 20/25 20/50 -1          Tonometry (Tonopen, 4:10 PM)       Right Left   Pressure 18 17         Neuro/Psych     Oriented x3: Yes   Mood/Affect: Normal         Dilation     Left eye: 2.5% Phenylephrine, 1.0% Mydriacyl @ 4:06 PM           Slit Lamp and Fundus Exam     External Exam       Right Left   External Normal Normal         Slit Lamp Exam       Right Left   Lids/Lashes Normal Normal   Conjunctiva/Sclera White and quiet White and quiet   Cornea Stromal fine reticulated and punctate opacities, ?  Lattice degeneration, no epithelial involvement Stromal fine reticulated and punctate opacities, ?  Lattice degeneration, no epithelial involvement   Anterior Chamber Deep and quiet Deep and quiet   Iris Round and reactive Round and reactive   Lens Posterior chamber intraocular lens Posterior chamber intraocular lens   Anterior Vitreous Normal Normal         Fundus Exam       Right Left   Posterior Vitreous  Posterior vitreous detachment   Disc  Normal   C/D Ratio  0.25   Macula  Microaneurysms, Macular thickening, Mild clinically significant macular edema   Vessels  Severe nonproliferative diabetic retinopathy with retinal nonperfusion   Periphery  Good peripheral PRP            IMAGING AND PROCEDURES  Imaging and Procedures for 03/13/22  OCT, Retina - OU - Both Eyes       Right Eye Quality was good. Scan locations included subfoveal. Central Foveal Thickness: 321. Progression has been stable. Findings include abnormal foveal contour, epiretinal membrane.   Left Eye Quality was good. Scan locations included subfoveal. Central Foveal Thickness: 302. Progression has improved. Findings include abnormal foveal contour.   Notes OS today at 6 weeks post Eylea.  CSME remains but improved at 6-week interval post Eylea repeat injection today  OD stable, improved CSME and maintained today at 5-week interval.  Follow-up OD as scheduled       Intravitreal Injection,  Pharmacologic Agent - OS - Left Eye       Time Out 03/13/2022. 4:39 PM. Confirmed correct patient, procedure, site, and patient consented.   Anesthesia Topical anesthesia was used. Anesthetic medications included Lidocaine 4%.   Procedure Preparation included 5% betadine to ocular surface, 10% betadine to eyelids, Tobramycin 0.3%. A 30 gauge  needle was used.   Injection: 2 mg aflibercept 2 MG/0.05ML   Route: Intravitreal, Site: Left Eye   NDC: [id number], Lot: [id number], Expiration date: 04/20/2023, Waste: 0 mL   Post-op Post injection exam found visual acuity of at least counting fingers. The patient tolerated the procedure well. There were no complications. The patient received written and verbal post procedure care education. Post injection medications included ocuflox.              ASSESSMENT/PLAN:  Diabetic macular edema of right eye with proliferative retinopathy associated with type 2 diabetes mellitus (HCC) At 5-week interval today, doing well  Proliferative diabetic retinopathy of left eye with macular edema associated with type 2 diabetes mellitus ([place]) OS today at 6 weeks, CSME remains controlled, on Eylea.  Repeat Eylea again today and examination again in 6 weeks  OSA on CPAP Off CPAP      ICD-10-CM   1. Proliferative diabetic retinopathy of left eye with macular edema associated with type 2 diabetes mellitus (HCC)  D62.2297 OCT, Retina - OU - Both Eyes    Intravitreal Injection, Pharmacologic Agent - OS - Left Eye    aflibercept (EYLEA) SOLN 2 mg    2. Diabetic macular edema of right eye with proliferative retinopathy associated with type 2 diabetes mellitus ([place])  E11.3511     3. OSA on CPAP  G47.33    Z99.89       1.  OS vastly improved macular sustained this will at 6 weeks post Eylea we will repeat injection today to maintain good acuity  2.  Follow-up next as scheduled  3.  Ophthalmic Meds Ordered this visit:  Meds ordered this encounter   Medications   aflibercept (EYLEA) SOLN 2 mg       Return in about 6 weeks (around 04/24/2022) for dilate, OS, EYLEA OCT, and follow-up OD Eylea OCT as scheduled.  There are no Patient Instructions on file for this visit.   Explained the diagnoses, plan, and follow up with the patient and they expressed understanding.  Patient expressed understanding of the importance of proper follow up care.   Clent Demark Daylon Lafavor M.D. Diseases & Surgery of the Retina and Vitreous Retina & Diabetic [place] 03/13/22     Abbreviations: M myopia (nearsighted); A astigmatism; H hyperopia (farsighted); P presbyopia; Mrx spectacle prescription;  CTL contact lenses; OD right eye; OS left eye; OU both eyes  XT exotropia; ET esotropia; PEK punctate epithelial keratitis; PEE punctate epithelial erosions; DES dry eye syndrome; MGD meibomian gland dysfunction; ATs artificial tears; PFAT's preservative free artificial tears; [place] nuclear sclerotic cataract; PSC posterior subcapsular cataract; ERM epi-retinal membrane; PVD posterior vitreous detachment; RD retinal detachment; DM diabetes mellitus; DR diabetic retinopathy; NPDR non-proliferative diabetic retinopathy; PDR proliferative diabetic retinopathy; CSME clinically significant macular edema; DME diabetic macular edema; dbh dot blot hemorrhages; CWS cotton wool spot; POAG primary open angle glaucoma; C/D cup-to-disc ratio; HVF humphrey visual field; GVF goldmann visual field; OCT optical coherence tomography; IOP intraocular pressure; BRVO Branch retinal vein occlusion; CRVO central retinal vein occlusion; CRAO central retinal artery occlusion; BRAO branch retinal artery occlusion; RT retinal tear; SB scleral buckle; PPV pars plana vitrectomy; VH Vitreous hemorrhage; PRP panretinal laser photocoagulation; IVK intravitreal kenalog; VMT vitreomacular traction; MH Macular hole;  NVD neovascularization of the disc; NVE neovascularization elsewhere; AREDS age related eye  disease study; ARMD age related macular degeneration; POAG primary open angle glaucoma; EBMD epithelial/anterior basement membrane dystrophy; ACIOL anterior chamber intraocular lens; IOL intraocular lens;  PCIOL posterior chamber intraocular lens; Phaco/IOL phacoemulsification with intraocular lens placement; [place] photorefractive keratectomy; LASIK laser assisted in situ keratomileusis; HTN hypertension; DM diabetes mellitus; COPD chronic obstructive pulmonary disease

## 2022-03-13 NOTE — Assessment & Plan Note (Signed)
Off CPAP

## 2022-03-13 NOTE — Assessment & Plan Note (Signed)
OS today at 6 weeks, CSME remains controlled, on Eylea.  Repeat Eylea again today and examination again in 6 weeks

## 2022-03-17 NOTE — Progress Notes (Unsigned)
Assessment and Plan:  There are no diagnoses linked to this encounter.    Further disposition pending results of labs. Discussed med's effects and SE's.   Over 30 minutes of exam, counseling, chart review, and critical decision making was performed.   Future Appointments  Date Time Provider [place]  03/18/2022  9:30 AM Alycia Rossetti, NP [facility] None  03/18/2022  3:30 PM Rankin, Clent Demark, MD [facility] None  04/24/2022  3:30 PM Rankin, Clent Demark, MD [facility] None  06/11/2022 10:00 AM Alycia Rossetti, NP [facility] None  06/26/2022 11:00 AM Alycia Rossetti, NP [facility] None  01/22/2023  8:45 AM Suzzanne Cloud, NP [facility] None    ------------------------------------------------------------------------------------------------------------------   HPI There were no vitals taken for this visit. 71 y.o.female presents for  Past Medical History:  Diagnosis Date   Abnormality of gait 07/28/2013   Chronic kidney disease    Diabetes ([place])    Diabetic peripheral neuropathy (HCC)    Diabetic retinopathy ([place])    Difficult intubation    Foot drop, bilateral 07/31/2014   GERD (gastroesophageal reflux disease)    Peripheral edema    Polyneuropathy in diabetes(357.2) 07/28/2013   Sleep apnea    uses cpap   Tongue mass      Allergies  Allergen Reactions   Gabapentin Swelling    Swelling in legs and feet    Current Outpatient Medications on File Prior to Visit  Medication Sig   acetaminophen (TYLENOL) 500 MG tablet Take 1,500 mg by mouth every 8 (eight) hours as needed for moderate pain.   Calcium 200 MG TABS Take 400 mg by mouth daily.   Cholecalciferol (DIALYVITE VITAMIN D 5000) 125 MCG (5000 UT) capsule Take 5,000 Units by mouth daily.   DULoxetine (CYMBALTA) 30 MG capsule Take 1 capsule (30 mg total) by mouth daily.   glimepiride (AMARYL) 2 MG tablet Take  1 tablet  2 x /day  with Meals  for Diabetes (Patient taking differently: Take 2 mg by mouth daily.)   glucose blood  (ACCU-CHEK GUIDE) test strip Check blood sugar 3 times a day-DX-E11.69 AND E78.5.   Magnesium 250 MG TABS Take 500 mg by mouth daily.   metFORMIN (GLUCOPHAGE-XR) 500 MG 24 hr tablet TAKE 2 TABLETS TWICE DAILY FOR DIABETES   Omega-3 350 MG CPDR Take 350 mg by mouth 2 (two) times daily.   vitamin C (ASCORBIC ACID) 500 MG tablet Take 500 mg by mouth daily.   zinc gluconate 50 MG tablet Take 50 mg by mouth daily.   No current facility-administered medications on file prior to visit.    ROS: all negative except above.   Physical Exam:  There were no vitals taken for this visit.  General Appearance: Well nourished, in no apparent distress. Eyes: PERRLA, EOMs, conjunctiva no swelling or erythema Sinuses: No Frontal/maxillary tenderness ENT/Mouth: Ext aud canals clear, TMs without erythema, bulging. No erythema, swelling, or exudate on post pharynx.  Tonsils not swollen or erythematous. Hearing normal.  Neck: Supple, thyroid normal.  Respiratory: Respiratory effort normal, BS equal bilaterally without rales, rhonchi, wheezing or stridor.  Cardio: RRR with no MRGs. Brisk peripheral pulses without edema.  Abdomen: Soft, + BS.  Non tender, no guarding, rebound, hernias, masses. Lymphatics: Non tender without lymphadenopathy.  Musculoskeletal: Full ROM, 5/5 strength, normal gait.  Skin: Warm, dry without rashes, lesions, ecchymosis.  Neuro: Cranial nerves intact. Normal muscle tone, no cerebellar symptoms. Sensation intact.  Psych: Awake and oriented X 3, normal affect, Insight  and Judgment appropriate.     Alycia Rossetti, NP 9:42 AM [facility]

## 2022-03-18 ENCOUNTER — Encounter: Payer: Self-pay | Admitting: Nurse Practitioner

## 2022-03-18 ENCOUNTER — Ambulatory Visit (INDEPENDENT_AMBULATORY_CARE_PROVIDER_SITE_OTHER): Payer: Medicare Other | Admitting: Ophthalmology

## 2022-03-18 ENCOUNTER — Ambulatory Visit (INDEPENDENT_AMBULATORY_CARE_PROVIDER_SITE_OTHER): Payer: Medicare Other | Admitting: Nurse Practitioner

## 2022-03-18 ENCOUNTER — Encounter (INDEPENDENT_AMBULATORY_CARE_PROVIDER_SITE_OTHER): Payer: Self-pay | Admitting: Ophthalmology

## 2022-03-18 VITALS — BP 142/70 | HR 77 | Temp 97.5°F | Ht 64.0 in | Wt 161.8 lb

## 2022-03-18 DIAGNOSIS — I10 Essential (primary) hypertension: Secondary | ICD-10-CM | POA: Diagnosis not present

## 2022-03-18 DIAGNOSIS — R300 Dysuria: Secondary | ICD-10-CM

## 2022-03-18 DIAGNOSIS — L97521 Non-pressure chronic ulcer of other part of left foot limited to breakdown of skin: Secondary | ICD-10-CM

## 2022-03-18 DIAGNOSIS — E113511 Type 2 diabetes mellitus with proliferative diabetic retinopathy with macular edema, right eye: Secondary | ICD-10-CM | POA: Diagnosis not present

## 2022-03-18 DIAGNOSIS — E1151 Type 2 diabetes mellitus with diabetic peripheral angiopathy without gangrene: Secondary | ICD-10-CM

## 2022-03-18 DIAGNOSIS — E08621 Diabetes mellitus due to underlying condition with foot ulcer: Secondary | ICD-10-CM | POA: Diagnosis not present

## 2022-03-18 MED ORDER — BLOOD GLUCOSE MONITOR KIT: PACK | 0 refills | Status: DC

## 2022-03-18 MED ORDER — AFLIBERCEPT 2MG/0.05ML IZ SOLN FOR KALEIDOSCOPE
2.0000 mg | INTRAVITREAL | Status: AC | PRN
  Administered 2022-03-18: 2 mg via INTRAVITREAL

## 2022-03-18 MED ORDER — NITROFURANTOIN MONOHYD MACRO 100 MG PO CAPS: 100.0000 mg | ORAL_CAPSULE | Freq: Two times a day (BID) | ORAL | 0 refills | Status: DC

## 2022-03-18 NOTE — Assessment & Plan Note (Signed)
OD vastly improved again at 6 weeks with no sign of early recurrence of CSME now.  Off of CPAP now.  Repeat Eylea today and reevaluate again in 6 weeks to maintain

## 2022-03-18 NOTE — Progress Notes (Signed)
03/18/2022     CHIEF COMPLAINT Patient presents for  Chief Complaint  Patient presents with   Diabetic Retinopathy with Macular Edema      HISTORY OF PRESENT ILLNESS: Grace [name] is a 71 y.o. female who presents to the clinic today for:   HPI   6 weeks for DILATE OD, EYLEA, OCT. Pt stated vision has not changed since last visit.   Last edited by Silvestre Moment on 03/18/2022  3:33 PM.      Referring physician: Unk Pinto, MD [address] [place],  [place] [address]  HISTORICAL INFORMATION:   Selected notes from the MEDICAL RECORD NUMBER    Lab Results  Component Value Date   HGBA1C 7.9 (H) 09/11/2021     CURRENT MEDICATIONS: No current outpatient medications on file. (Ophthalmic Drugs)   No current facility-administered medications for this visit. (Ophthalmic Drugs)   Current Outpatient Medications (Other)  Medication Sig   acetaminophen (TYLENOL) 500 MG tablet Take 1,500 mg by mouth every 8 (eight) hours as needed for moderate pain.   blood glucose meter kit and supplies KIT Dispense based on patient and insurance preference. Use up to four times daily as directed.   Cholecalciferol (DIALYVITE VITAMIN D 5000) 125 MCG (5000 UT) capsule Take 5,000 Units by mouth daily.   DULoxetine (CYMBALTA) 30 MG capsule Take 1 capsule (30 mg total) by mouth daily.   glimepiride (AMARYL) 2 MG tablet Take  1 tablet  2 x /day  with Meals  for Diabetes (Patient taking differently: Take 2 mg by mouth daily.)   glucose blood (ACCU-CHEK GUIDE) test strip Check blood sugar 3 times a day-DX-E11.69 AND E78.5.   Magnesium 250 MG TABS Take 500 mg by mouth daily.   metFORMIN (GLUCOPHAGE-XR) 500 MG 24 hr tablet TAKE 2 TABLETS TWICE DAILY FOR DIABETES   nitrofurantoin, macrocrystal-monohydrate, (MACROBID) 100 MG capsule Take 1 capsule (100 mg total) by mouth 2 (two) times daily for 7 days.   Omega-3 350 MG CPDR Take 350 mg by mouth 2 (two) times daily.   zinc gluconate 50  MG tablet Take 50 mg by mouth daily.   No current facility-administered medications for this visit. (Other)      REVIEW OF SYSTEMS: ROS   Negative for: Constitutional, Gastrointestinal, Neurological, Skin, Genitourinary, Musculoskeletal, HENT, Endocrine, Cardiovascular, Eyes, Respiratory, Psychiatric, Allergic/Imm, Heme/Lymph Last edited by Silvestre Moment on 03/18/2022  3:33 PM.       ALLERGIES Allergies  Allergen Reactions   Gabapentin Swelling    Swelling in legs and feet    PAST MEDICAL HISTORY Past Medical History:  Diagnosis Date   Abnormality of gait 07/28/2013   Chronic kidney disease    Diabetes ([place])    Diabetic peripheral neuropathy (HCC)    Diabetic retinopathy ([place])    Difficult intubation    Foot drop, bilateral 07/31/2014   GERD (gastroesophageal reflux disease)    Peripheral edema    Polyneuropathy in diabetes(357.2) 07/28/2013   Sleep apnea    uses cpap   Tongue mass    Past Surgical History:  Procedure Laterality Date   ABDOMINAL HYSTERECTOMY     ANKLE FRACTURE SURGERY     left   CATARACT EXTRACTION W/PHACO Right 02/21/2014   Dr. Herbert Deaner   CATARACT EXTRACTION W/PHACO Left 04/10/2014   Dr. Herbert Deaner   CATARACT EXTRACTION, BILATERAL     DIRECT LARYNGOSCOPY Left 08/02/2021   Procedure: DIRECT LARYNGOSCOPY WITH TONGUE BIOPSY;  Surgeon: Leta Baptist, MD;  Location: [facility];  Service: ENT;  Laterality: Left;   HIP SURGERY     right   MASS BIOPSY Left 09/06/2021   Procedure: NECK MASS BIOPSY;  Surgeon: Leta Baptist, MD;  Location: [facility];  Service: ENT;  Laterality: Left;   TONSILLECTOMY      FAMILY HISTORY Family History  Problem Relation Age of Onset   Diabetes Mother    Breast cancer Maternal Aunt    Diabetes Brother    Neuropathy Neg Hx     SOCIAL HISTORY Social History   Tobacco Use   Smoking status: Never   Smokeless tobacco: Never  Substance Use Topics   Alcohol use: No   Drug use: No         OPHTHALMIC  EXAM:  Base Eye Exam     Visual Acuity (ETDRS)       Right Left   Dist cc 20/25 -2 20/60 -1   Dist ph cc  20/50 -1    Correction: Glasses         Tonometry (Tonopen, 3:39 PM)       Right Left   Pressure 16 13         Pupils       Pupils APD   Right PERRL None   Left PERRL None         Visual Fields       Left Right    Full Full         Extraocular Movement       Right Left    Full, Ortho Full, Ortho         Neuro/Psych     Oriented x3: Yes   Mood/Affect: Normal         Dilation     Right eye: 2.5% Phenylephrine, 1.0% Mydriacyl @ 3:39 PM           Slit Lamp and Fundus Exam     External Exam       Right Left   External Normal Normal         Slit Lamp Exam       Right Left   Lids/Lashes Normal Normal   Conjunctiva/Sclera White and quiet White and quiet   Cornea Stromal fine reticulated and punctate opacities, ?  Lattice degeneration, no epithelial involvement Stromal fine reticulated and punctate opacities, ?  Lattice degeneration, no epithelial involvement   Anterior Chamber Deep and quiet Deep and quiet   Iris Round and reactive Round and reactive   Lens Posterior chamber intraocular lens Posterior chamber intraocular lens   Anterior Vitreous Normal Normal         Fundus Exam       Right Left   Posterior Vitreous Posterior vitreous detachment    Disc Normal    C/D Ratio 0.3    Macula Epiretinal membrane, good focal laser, Macular thickening minor    Vessels PDR-quiet    Periphery good PRP, room for anterior PRP present temporally and nasally and superiorly             IMAGING AND PROCEDURES  Imaging and Procedures for 03/18/22  OCT, Retina - OU - Both Eyes       Right Eye Quality was good. Scan locations included subfoveal. Central Foveal Thickness: 315. Progression has been stable. Findings include abnormal foveal contour, epiretinal membrane.   Left Eye Quality was good. Scan locations included  subfoveal. Central Foveal Thickness: 303. Progression has improved. Findings include abnormal foveal contour.  Notes OS today at 6 weeks post Eylea.  CSME remains but improved at 6-week interval post Eylea and now after recent injection we will continue to follow left eye as scheduled  OD stable, improved CSME and maintained today at 6-week interval.  Need injection today at 6 weeks as no recurrence of CSME has developed.  Repeat evaluation of right again in 6 weeks       Intravitreal Injection, Pharmacologic Agent - OD - Right Eye       Time Out 03/18/2022. 4:21 PM. Confirmed correct patient, procedure, site, and patient consented.   Anesthesia Topical anesthesia was used. Anesthetic medications included Lidocaine 4%.   Procedure Preparation included 5% betadine to ocular surface, 10% betadine to eyelids, Tobramycin 0.3%. A 30 gauge needle was used.   Injection: 2 mg aflibercept 2 MG/0.05ML   Route: Intravitreal, Site: Right Eye   NDC: [id number], Lot: [id number], Expiration date: 08/18/2022, Waste: 0 mL   Post-op Post injection exam found visual acuity of at least counting fingers. The patient tolerated the procedure well. There were no complications. The patient received written and verbal post procedure care education. Post injection medications included ocuflox.              ASSESSMENT/PLAN:  Diabetic macular edema of right eye with proliferative retinopathy associated with type 2 diabetes mellitus ([place]) OD vastly improved again at 6 weeks with no sign of early recurrence of CSME now.  Off of CPAP now.  Repeat Eylea today and reevaluate again in 6 weeks to maintain     ICD-10-CM   1. Diabetic macular edema of right eye with proliferative retinopathy associated with type 2 diabetes mellitus (HCC)  E11.3511 OCT, Retina - OU - Both Eyes    Intravitreal Injection, Pharmacologic Agent - OD - Right Eye    aflibercept (EYLEA) SOLN 2 mg        2.  3.  Ophthalmic  Meds Ordered this visit:  Meds ordered this encounter  Medications   aflibercept (EYLEA) SOLN 2 mg       Return in about 6 weeks (around 04/29/2022) for EYLEA OCT, OD,, OS as scheduled .  There are no Patient Instructions on file for this visit.   Explained the diagnoses, plan, and follow up with the patient and they expressed understanding.  Patient expressed understanding of the importance of proper follow up care.   Clent Demark Kasie Leccese M.D. Diseases & Surgery of the Retina and Vitreous Retina & Diabetic [place] 03/18/22     Abbreviations: M myopia (nearsighted); A astigmatism; H hyperopia (farsighted); P presbyopia; Mrx spectacle prescription;  CTL contact lenses; OD right eye; OS left eye; OU both eyes  XT exotropia; ET esotropia; PEK punctate epithelial keratitis; PEE punctate epithelial erosions; DES dry eye syndrome; MGD meibomian gland dysfunction; ATs artificial tears; PFAT's preservative free artificial tears; [place] nuclear sclerotic cataract; PSC posterior subcapsular cataract; ERM epi-retinal membrane; PVD posterior vitreous detachment; RD retinal detachment; DM diabetes mellitus; DR diabetic retinopathy; NPDR non-proliferative diabetic retinopathy; PDR proliferative diabetic retinopathy; CSME clinically significant macular edema; DME diabetic macular edema; dbh dot blot hemorrhages; CWS cotton wool spot; POAG primary open angle glaucoma; C/D cup-to-disc ratio; HVF humphrey visual field; GVF goldmann visual field; OCT optical coherence tomography; IOP intraocular pressure; BRVO Branch retinal vein occlusion; CRVO central retinal vein occlusion; CRAO central retinal artery occlusion; BRAO branch retinal artery occlusion; RT retinal tear; SB scleral buckle; PPV pars plana vitrectomy; VH Vitreous hemorrhage; PRP panretinal laser photocoagulation; IVK intravitreal kenalog;  VMT vitreomacular traction; MH Macular hole;  NVD neovascularization of the disc; NVE neovascularization elsewhere;  AREDS age related eye disease study; ARMD age related macular degeneration; POAG primary open angle glaucoma; EBMD epithelial/anterior basement membrane dystrophy; ACIOL anterior chamber intraocular lens; IOL intraocular lens; PCIOL posterior chamber intraocular lens; Phaco/IOL phacoemulsification with intraocular lens placement; [place] photorefractive keratectomy; LASIK laser assisted in situ keratomileusis; HTN hypertension; DM diabetes mellitus; COPD chronic obstructive pulmonary disease

## 2022-03-18 NOTE — Patient Instructions (Addendum)
I have sent in a prescription for Macrobid twice a day for 7 days for UTI  I sent in a new prescription for a glucometer to your pharmacy, please check blood sugars fasting daily  Appointment at wound center tomorrow at 2:15  Diabetes Mellitus and Skin Care Diabetes, also called diabetes mellitus, can lead to skin problems. People with diabetes have a higher risk for many types of skin complications because poorly controlled blood sugar (glucose) levels can cause problems over time. These problems include: Damage to nerves. This can affect your ability to feel wounds, which means you may not notice minor skin injuries that could lead to serious problems. This can also decrease the amount that you sweat, causing dry skin that can break down. Damage to blood vessels. The lack of blood flow can cause skin to break down and can slow healing time, which can lead to infections. Areas of skin that become thick or discolored. Common skin conditions There are certain skin conditions that commonly affect people who have diabetes, such as: Dry skin. Thin skin. The skin on the feet may get thinner, break more easily, and heal more slowly than normal. Bacterial skin infections, including: Styes. Boils. Infected hair follicles. Infections of the skin around the nails. Fungal skin infections. These are most common in areas where skin rubs together, such as in the armpits or under the breasts. Common skin changes Diabetes can also cause the skin to change. You may develop: Dark, velvety markings on the skin that usually appear on the face, neck, armpits, inner thighs, and groin. Red, raised, scar-like tissue that may itch, feel painful, or develop into a wound. Blisters on the feet, toes, hands, or fingers. Thickened, wax-like areas of skin that usually occur on the hands, forehead, or toes. Brown or red, ring-shaped or half-ring-shaped patches of skin on the ears or fingers. Pea-shaped, yellow bumps  that may be itchy and surrounded by a red ring. This usually affects the arms, feet, buttocks, and the top of the hands. Round, discolored patches of tan skin that do not hurt or itch. These may look like age spots. General tips Most skin problems can be prevented or treated easily if caught early. Talk with your health care provider if you have any concerns. General tips usually include: Check your skin every day for cuts, bruises, redness, blisters, or sores, especially on your feet. Tell your health care provider about any cuts, wounds, or sores that you have and if they are healing slowly. Keep your skin clean and dry. Do not use hot water. Moisturize your skin to prevent chapping. Do not scratch dry skin. Scratching dry skin can expose it to infection. Keep your blood glucose levels within target range. Supplies needed: Mild soap or gentle skin cleanser. Lotion. How to care for dry itchy skin It is common for people with diabetes to have itchy skin caused by dryness. Frequent high glucose levels can cause itchiness, and poor circulation and certain skin infections can make dry, itchy skin worse. If you have itchy skin that is red or covered in a rash, this could be a sign of an allergic reaction. If you have a rash or if your skin is very itchy, contact your health care provider. You may need help to manage your diabetes better, or you may need treatment for an infection. Untreated fungal infections can be dangerous because they allow more serious bacterial infections to enter the body. To relieve dry skin and itching: Avoid very hot  showers and baths. Use mild soap and gentle skin cleansers. Do not use soap that is perfumed or harsh or that dries your skin. Moisturizing soaps may help. Put on moisturizing lotion as soon as you finish bathing. Follow these instructions at home:  Schedule a foot exam with your health care provider once every year. This exam includes an inspection of the  structure and skin of your feet. Make sure that your health care provider performs a visual foot exam at every medical visit. If you get a skin injury, such as a cut, blister, or sore, check the area every day for signs of infection. Check for: Redness, swelling, or pain. Fluid or blood. Warmth. Pus or a bad smell. Do not use any products that contain nicotine or tobacco. These products include cigarettes, chewing tobacco, and vaping devices, such as e-cigarettes. If you need help quitting, ask your health care provider. Take over-the-counter and prescription medicines only as told by your health care provider. This includes all diabetes medicines you are taking. Where to find more information American Diabetes Association: www.diabetes.org Association of Diabetes Care & Education Specialists: www.diabeteseducator.org Contact a health care provider if you: Develop a cut or sore, especially on your feet. Develop signs of infection after a skin injury. Have itchy skin that develops redness or a rash. Have discolored areas of skin. Have areas where your skin is changing, such as thickening or appearing shiny. Summary People with diabetes have a higher risk for many types of skin complications. This is because poorly controlled blood sugar (glucose) levels can cause skin problems over time. Most skin problems can be prevented or treated easily if caught early. Keep your blood glucose levels within target range. Check your skin every day for cuts, bruises, redness, blisters, or sores, especially on your feet. Tell your health care provider about any cuts, wounds, or sores that you have and if they are healing slowly. This information is not intended to replace advice given to you by your health care provider. Make sure you discuss any questions you have with your health care provider. Document Revised: 02/23/2020 Document Reviewed: 02/23/2020 Elsevier Patient Education  [place].

## 2022-03-19 ENCOUNTER — Encounter: Payer: Medicare Other | Attending: Internal Medicine | Admitting: Internal Medicine

## 2022-03-19 ENCOUNTER — Other Ambulatory Visit: Payer: Self-pay | Admitting: Nurse Practitioner

## 2022-03-19 DIAGNOSIS — E1122 Type 2 diabetes mellitus with diabetic chronic kidney disease: Secondary | ICD-10-CM | POA: Diagnosis not present

## 2022-03-19 DIAGNOSIS — I129 Hypertensive chronic kidney disease with stage 1 through stage 4 chronic kidney disease, or unspecified chronic kidney disease: Secondary | ICD-10-CM | POA: Insufficient documentation

## 2022-03-19 DIAGNOSIS — N189 Chronic kidney disease, unspecified: Secondary | ICD-10-CM | POA: Diagnosis not present

## 2022-03-19 DIAGNOSIS — L97522 Non-pressure chronic ulcer of other part of left foot with fat layer exposed: Secondary | ICD-10-CM | POA: Diagnosis not present

## 2022-03-19 DIAGNOSIS — E1142 Type 2 diabetes mellitus with diabetic polyneuropathy: Secondary | ICD-10-CM | POA: Insufficient documentation

## 2022-03-19 DIAGNOSIS — E1140 Type 2 diabetes mellitus with diabetic neuropathy, unspecified: Secondary | ICD-10-CM

## 2022-03-19 DIAGNOSIS — E11621 Type 2 diabetes mellitus with foot ulcer: Secondary | ICD-10-CM | POA: Diagnosis not present

## 2022-03-19 DIAGNOSIS — E875 Hyperkalemia: Secondary | ICD-10-CM

## 2022-03-19 NOTE — Progress Notes (Signed)
[name], [name] ([id number]) Visit Report for 03/19/2022 Allergy List Details Patient Name: Renninger, [name] Date of Service: 03/19/2022 2:30 PM Medical Record Number: [id number] Patient Account Number: [id number] Date of Birth/Sex: 15-Oct-1950 (71 y.o. Female) Treating RN: Levora Dredge Primary Care Abir Craine: Unk Pinto Other Clinician: Referring Peityn Payton: Lorenda Peck Treating Irish Breisch/Extender: Yaakov Guthrie in Treatment: 0 Allergies Active Allergies gabapentin Reaction: swelling Allergy Notes Electronic Signature(s) Signed: 03/19/2022 4:19:14 PM By: Levora Dredge Entered By: Levora Dredge on 03/19/2022 14:44:51 Pantano, Mikailah B. ([id number]) -------------------------------------------------------------------------------- Arrival Information Details Patient Name: [name], [name] Date of Service: 03/19/2022 2:30 PM Medical Record Number: [id number] Patient Account Number: [id number] Date of Birth/Sex: 12-28-1950 (71 y.o. Female) Treating RN: Levora Dredge Primary Care Keyosha Tiedt: Unk Pinto Other Clinician: Referring Tanija Germani: Lorenda Peck Treating Daryana Whirley/Extender: Yaakov Guthrie in Treatment: 0 Visit Information Patient Arrived: Charlyn Minerva Time: 14:35 Accompanied By: husband Transfer Assistance: None Patient Identification Verified: Yes Secondary Verification Process Completed: Yes Electronic Signature(s) Signed: 03/19/2022 4:19:14 PM By: Levora Dredge Entered By: Levora Dredge on 03/19/2022 14:43:54 Gullo, [place] ([id number]) -------------------------------------------------------------------------------- Clinic Level of Care Assessment Details Patient Name: [name], [name] Date of Service: 03/19/2022 2:30 PM Medical Record Number: [id number] Patient Account Number: [id number] Date of Birth/Sex: 16-Nov-1950 (71 y.o. Female) Treating RN: Levora Dredge Primary Care Avinash Maltos: Unk Pinto Other  Clinician: Referring Johnnie Moten: Lorenda Peck Treating Nansi Birmingham/Extender: Yaakov Guthrie in Treatment: 0 Clinic Level of Care Assessment Items TOOL 1 Quantity Score _0  - Use when EandM and Procedure is performed on INITIAL visit 0 ASSESSMENTS - Nursing Assessment / Reassessment X - General Physical Exam (combine w/ comprehensive assessment (listed just below) when performed on new 1 20 pt. evals) X- 1 25 Comprehensive Assessment (HX, ROS, Risk Assessments, Wounds Hx, etc.) ASSESSMENTS - Wound and Skin Assessment / Reassessment _1  - Dermatologic / Skin Assessment (not related to wound area) 0 ASSESSMENTS - Ostomy and/or Continence Assessment and Care _2  - Incontinence Assessment and Management 0 _3  - 0 Ostomy Care Assessment and Management (repouching, etc.) PROCESS - Coordination of Care X - Simple Patient / Family Education for ongoing care 1 15 _4  - 0 Complex (extensive) Patient / Family Education for ongoing care X- 1 10 Staff obtains Programmer, systems, Records, Test Results / Process Orders _5  - 0 Staff telephones HHA, Nursing Homes / Clarify orders / etc _6  - 0 Routine Transfer to another Facility (non-emergent condition) _7  - 0 Routine Hospital Admission (non-emergent condition) X- 1 15 New Admissions / Biomedical engineer / Ordering NPWT, Apligraf, etc. _8  - 0 Emergency Hospital Admission (emergent condition) PROCESS - Special Needs _9  - Pediatric / Minor Patient Management 0 _10  - 0 Isolation Patient Management _11  - 0 Hearing / Language / Visual special needs _12  - 0 Assessment of Community assistance (transportation, D/C planning, etc.) _13  - 0 Additional assistance / Altered mentation _14  - 0 Support Surface(s) Assessment (bed, cushion, seat, etc.) INTERVENTIONS - Miscellaneous _15  - External ear exam 0 _16  - 0 Patient Transfer (multiple staff / Civil Service fast streamer / Similar devices) _17  - 0 Simple Staple / Suture removal (25 or less) _18  - 0 Complex Staple /  Suture removal (26 or more) _19  - 0 Hypo/Hyperglycemic Management (do not check if billed separately) X- 1 15 Ankle / Brachial Index (ABI) - do not check if billed separately Has the patient been seen at the hospital within the last three years: Yes Total Score: 100 Level Of Care: New/Established - Level 3 Riveron, Tanaisha B. ([id number]) Electronic Signature(s) Signed: 03/19/2022  4:19:14 PM By: Levora Dredge Entered By: Levora Dredge on 03/19/2022 15:55:45 Matherne, Perri B. ([id number]) -------------------------------------------------------------------------------- Encounter Discharge Information Details Patient Name: [name], [name] Date of Service: 03/19/2022 2:30 PM Medical Record Number: [id number] Patient Account Number: [id number] Date of Birth/Sex: 06/29/1951 (70 y.o. Female) Treating RN: Levora Dredge Primary Care Alyson Ki: Unk Pinto Other Clinician: Referring Advith Martine: Lorenda Peck Treating Lilliemae Fruge/Extender: Yaakov Guthrie in Treatment: 0 Encounter Discharge Information Items Post Procedure Vitals Discharge Condition: Stable Temperature (F): 97.7 Ambulatory Status: Walker Pulse (bpm): 75 Discharge Destination: Home Respiratory Rate (breaths/min): 18 Transportation: Private Auto Blood Pressure (mmHg): 145/68 Accompanied By: husband Schedule Follow-up Appointment: Yes Clinical Summary of Care: Electronic Signature(s) Signed: 03/19/2022 4:19:14 PM By: Levora Dredge Entered By: Levora Dredge on 03/19/2022 15:56:46 [place], [place] ([id number]) -------------------------------------------------------------------------------- Lower Extremity Assessment Details Patient Name: Loisel, Astria B. Date of Service: 03/19/2022 2:30 PM Medical Record Number: [id number] Patient Account Number: [id number] Date of Birth/Sex: 05-Jun-1951 (71 y.o. Female) Treating RN: Levora Dredge Primary Care Brayn Eckstein: Unk Pinto Other Clinician: Referring  Lorece Keach: Lorenda Peck Treating Gurjot Brisco/Extender: Yaakov Guthrie in Treatment: 0 Edema Assessment Assessed: [Left: No] [Right: No] Edema: [Left: N] [Right: o] Calf Left: Right: Point of Measurement: 29 cm From Medial Instep 32.5 cm Ankle Left: Right: Point of Measurement: 10 cm From Medial Instep 21.5 cm Vascular Assessment Pulses: Dorsalis Pedis Palpable: [Left:Yes] Blood Pressure: Brachial: [Left:162] [Right:162] Ankle: [Left:Dorsalis Pedis: 155 0.96] [Right:Dorsalis Pedis: 169 1.04] Notes pedal pulse palpable but weak Electronic Signature(s) Signed: 03/19/2022 4:19:14 PM By: Levora Dredge Entered By: Levora Dredge on 03/19/2022 15:00:57 Seliga, [place] ([id number]) -------------------------------------------------------------------------------- Multi Wound Chart Details Patient Name: Bueso, Gladyse B. Date of Service: 03/19/2022 2:30 PM Medical Record Number: [id number] Patient Account Number: [id number] Date of Birth/Sex: Apr 02, 1951 (71 y.o. Female) Treating RN: Levora Dredge Primary Care Larken Urias: Unk Pinto Other Clinician: Referring Koda Defrank: Lorenda Peck Treating Ardian Haberland/Extender: Yaakov Guthrie in Treatment: 0 Vital Signs Height(in): 63 Pulse(bpm): 75 Weight(lbs): 160 Blood Pressure(mmHg): 145/68 Body Mass Index(BMI): 28.3 Temperature(F): 97.7 Respiratory Rate(breaths/min): 18 Photos: [N/A:N/A] Wound Location: Left Foot N/A N/A Wounding Event: Blister N/A N/A Primary Etiology: Diabetic Wound/Ulcer of the Lower N/A N/A Extremity Comorbid History: Sleep Apnea, Hypertension, Type II N/A N/A Diabetes, Neuropathy Date Acquired: 03/19/2022 N/A N/A Weeks of Treatment: 0 N/A N/A Wound Status: Open N/A N/A Wound Recurrence: No N/A N/A Measurements L x W x D (cm) 3x4x0.1 N/A N/A Area (cm) : 9.425 N/A N/A Volume (cm) : 0.942 N/A N/A % Reduction in Area: 0.00% N/A N/A % Reduction in Volume: 0.00% N/A N/A Classification:  Grade 2 N/A N/A Exudate Amount: Medium N/A N/A Exudate Type: Serosanguineous N/A N/A Exudate Color: red, brown N/A N/A Granulation Amount: None Present (0%) N/A N/A Necrotic Amount: Large (67-100%) N/A N/A Epithelialization: None N/A N/A Debridement: Debridement - Excisional N/A N/A Pre-procedure Verification/Time 15:26 N/A N/A Out Taken: Pain Control: Lidocaine 4% Topical Solution N/A N/A Tissue Debrided: Callus, Subcutaneous, Slough N/A N/A Level: Skin/Subcutaneous Tissue N/A N/A Debridement Area (sq cm): 12 N/A N/A Instrument: Blade, Curette, Forceps N/A N/A Bleeding: None N/A N/A Deeds, Ree B. ([id number]) Debridement Treatment Procedure was tolerated well N/A N/A Response: Post Debridement 3x4x0.4 N/A N/A Measurements L x W x D (cm) Post Debridement Volume: 3.77 N/A N/A (cm) Procedures Performed: Debridement N/A N/A Treatment Notes Wound #1 (Foot) Wound Laterality: Left Cleanser Byram Ancillary Kit - 15 Day Supply Discharge Instruction: Use supplies as instructed; Kit contains: (15) Saline Bullets; (15) 3x3 Gauze; 15 pr Gloves Soap and  Water Discharge Instruction: Gently cleanse wound with antibacterial soap, rinse and pat dry prior to dressing wounds Peri-Wound Care Topical Gentamicin Discharge Instruction: Apply as directed by Millissa Deese. IN OFFICE ONLY Primary Dressing Silvercel Small 2x2 (in/in) Discharge Instruction: Apply Silvercel Small 2x2 (in/in) as instructed Secondary Dressing Gauze Discharge Instruction: As directed: dry felt pad Discharge Instruction: used to relieve pressure Secured With Medipore Tape - 17M Medipore H Soft Cloth Surgical Tape, 2x2 (in/yd) Kerlix Roll Sterile or Non-Sterile 6-ply 4.5x4 (yd/yd) Discharge Instruction: Apply Kerlix as directed Stretch Net Dressing, Latex-free, Size 5, Small-Head / Shoulder / Thigh Discharge Instruction: size 4 used Compression Wrap Compression Stockings Add-Ons Electronic Signature(s) Signed:  03/19/2022 4:23:55 PM By: Kalman Shan DO Entered By: Kalman Shan on 03/19/2022 15:57:20 Cressey, Tylicia B. ([id number]) -------------------------------------------------------------------------------- [place] Details Patient Name: Reaume, Ziza B. Date of Service: 03/19/2022 2:30 PM Medical Record Number: [id number] Patient Account Number: [id number] Date of Birth/Sex: [birth year] (71 y.o. Female) Treating RN: Levora Dredge Primary Care Niko Penson: Unk Pinto Other Clinician: Referring Adryan Shin: Lorenda Peck Treating Akylah Hascall/Extender: Yaakov Guthrie in Treatment: 0 Active Inactive Orientation to the Wound Care Program Nursing Diagnoses: Knowledge deficit related to the wound healing center program Goals: Patient/caregiver will verbalize understanding of the [facility] Program Date Initiated: 03/19/2022 Target Resolution Date: 03/26/2022 Goal Status: Active Interventions: Provide education on orientation to the wound center Notes: Wound/Skin Impairment Nursing Diagnoses: Impaired tissue integrity Knowledge deficit related to ulceration/compromised skin integrity Goals: Ulcer/skin breakdown will have a volume reduction of 30% by week 4 Date Initiated: 03/19/2022 Target Resolution Date: 04/16/2022 Goal Status: Active Ulcer/skin breakdown will have a volume reduction of 50% by week 8 Date Initiated: 03/19/2022 Target Resolution Date: 05/14/2022 Goal Status: Active Ulcer/skin breakdown will have a volume reduction of 80% by week 12 Date Initiated: 03/19/2022 Target Resolution Date: 06/11/2022 Goal Status: Active Ulcer/skin breakdown will heal within 14 weeks Date Initiated: 03/19/2022 Target Resolution Date: 06/25/2022 Goal Status: Active Interventions: Assess patient/caregiver ability to obtain necessary supplies Assess patient/caregiver ability to perform ulcer/skin care regimen upon admission and as needed Assess ulceration(s)  every visit Provide education on ulcer and skin care Notes: Electronic Signature(s) Signed: 03/19/2022 4:19:14 PM By: Levora Dredge Entered By: Levora Dredge on 03/19/2022 15:24:43 Sonoda, Taurus B. ([id number]) -------------------------------------------------------------------------------- Pain Assessment Details Patient Name: Janus, Keala B. Date of Service: 03/19/2022 2:30 PM Medical Record Number: [id number] Patient Account Number: [id number] Date of Birth/Sex: 02-19-1951 (71 y.o. Female) Treating RN: Levora Dredge Primary Care Joya Willmott: Unk Pinto Other Clinician: Referring Murriel Eidem: Lorenda Peck Treating Luan Maberry/Extender: Yaakov Guthrie in Treatment: 0 Active Problems Location of Pain Severity and Description of Pain Patient Has Paino No Site Locations Rate the pain. Current Pain Level: 0 Pain Management and Medication Current Pain Management: Electronic Signature(s) Signed: 03/19/2022 4:19:14 PM By: Levora Dredge Entered By: Levora Dredge on 03/19/2022 14:44:01 Moore, Rejoice B. ([id number]) -------------------------------------------------------------------------------- Patient/Caregiver Education Details Patient Name: Mandich, Katti B. Date of Service: 03/19/2022 2:30 PM Medical Record Number: [id number] Patient Account Number: [id number] Date of Birth/Gender: 07-Jul-1951 (71 y.o. Female) Treating RN: Levora Dredge Primary Care Physician: Unk Pinto Other Clinician: Referring Physician: Lorenda Peck Treating Physician/Extender: Yaakov Guthrie in Treatment: 0 Education Assessment Education Provided To: Patient Education Topics Provided Welcome To [facility]: Handouts: Welcome To [facility] Methods: Explain/Verbal Responses: State content correctly Wound Debridement: Handouts: Wound Debridement Methods: Explain/Verbal Responses: State content correctly Wound/Skin Impairment: Handouts:  Caring for Your Ulcer Methods: Explain/Verbal Responses: State content correctly Electronic Signature(s)  Signed: 03/19/2022 4:19:14 PM By: Levora Dredge Entered By: Levora Dredge on 03/19/2022 15:56:08 Napierkowski, [place] B. ([id number]) -------------------------------------------------------------------------------- Wound Assessment Details Patient Name: Massenburg, Latalia B. Date of Service: 03/19/2022 2:30 PM Medical Record Number: [id number] Patient Account Number: [id number] Date of Birth/Sex: 1950-10-29 (71 y.o. Female) Treating RN: Levora Dredge Primary Care Elfreida Heggs: Unk Pinto Other Clinician: Referring Emiel Kielty: Lorenda Peck Treating Doriana Mazurkiewicz/Extender: Yaakov Guthrie in Treatment: 0 Wound Status Wound Number: 1 Primary Etiology: Diabetic Wound/Ulcer of the Lower Extremity Wound Location: Left Foot Wound Status: Open Wounding Event: Blister Comorbid Sleep Apnea, Hypertension, Type II Diabetes, History: Neuropathy Date Acquired: 03/19/2022 Weeks Of Treatment: 0 Clustered Wound: No Photos Wound Measurements Length: (cm) 3 Width: (cm) 4 Depth: (cm) 0.1 Area: (cm) 9.425 Volume: (cm) 0.942 % Reduction in Area: 0% % Reduction in Volume: 0% Epithelialization: None Tunneling: No Undermining: No Wound Description Classification: Grade 2 Exudate Amount: Medium Exudate Type: Serosanguineous Exudate Color: red, brown Foul Odor After Cleansing: No Slough/Fibrino Yes Wound Bed Granulation Amount: None Present (0%) Necrotic Amount: Large (67-100%) Necrotic Quality: Adherent Slough Treatment Notes Wound #1 (Foot) Wound Laterality: Left Cleanser Byram Ancillary Kit - 15 Day Supply Discharge Instruction: Use supplies as instructed; Kit contains: (15) Saline Bullets; (15) 3x3 Gauze; 15 pr Gloves Soap and Water Discharge Instruction: Gently cleanse wound with antibacterial soap, rinse and pat dry prior to dressing wounds Peri-Wound  Care Topical Gentamicin Werst, Walaa B. ([id number]) Discharge Instruction: Apply as directed by Rino Hosea. IN OFFICE ONLY Primary Dressing Silvercel Small 2x2 (in/in) Discharge Instruction: Apply Silvercel Small 2x2 (in/in) as instructed Secondary Dressing Gauze Discharge Instruction: As directed: dry felt pad Discharge Instruction: used to relieve pressure Secured With Medipore Tape - 89M Medipore H Soft Cloth Surgical Tape, 2x2 (in/yd) Kerlix Roll Sterile or Non-Sterile 6-ply 4.5x4 (yd/yd) Discharge Instruction: Apply Kerlix as directed Stretch Net Dressing, Latex-free, Size 5, Small-Head / Shoulder / Thigh Discharge Instruction: size 4 used Compression Wrap Compression Stockings Add-Ons Electronic Signature(s) Signed: 03/19/2022 4:19:14 PM By: Levora Dredge Entered By: Levora Dredge on 03/19/2022 15:55:03 Demeter, Jaylea B. ([id number]) -------------------------------------------------------------------------------- Vitals Details Patient Name: Dinh, Makaylah B. Date of Service: 03/19/2022 2:30 PM Medical Record Number: [id number] Patient Account Number: [id number] Date of Birth/Sex: 05-Mar-1951 (71 y.o. Female) Treating RN: Levora Dredge Primary Care Selen Smucker: Unk Pinto Other Clinician: Referring Yoshie Kosel: Lorenda Peck Treating Masiah Woody/Extender: Yaakov Guthrie in Treatment: 0 Vital Signs Time Taken: 14:44 Temperature (F): 97.7 Height (in): 63 Pulse (bpm): 75 Source: Stated Respiratory Rate (breaths/min): 18 Weight (lbs): 160 Blood Pressure (mmHg): 145/68 Source: Stated Reference Range: 80 - 120 mg / dl Body Mass Index (BMI): 28.3 Electronic Signature(s) Signed: 03/19/2022 4:19:14 PM By: Levora Dredge Entered By: Levora Dredge on 03/19/2022 14:44:31

## 2022-03-19 NOTE — Progress Notes (Signed)
Grace, [name] ([id number]) Visit Report for 03/19/2022 Chief Complaint Document Details Patient Name: Grace Grace, [name] Date of Service: 03/19/2022 2:30 PM Medical Record Number: [id number] Patient Account Number: [id number] Date of Birth/Sex: [birth year] (71 y.o. Female) Treating RN: Levora Dredge Primary Care Provider: Unk Pinto Other Clinician: Referring Provider: Lorenda Peck Treating Provider/Extender: Yaakov Guthrie in Treatment: 0 Information Obtained from: Patient Chief Complaint 03/19/2022; left foot wound Electronic Signature(s) Signed: 03/19/2022 4:23:55 PM By: Kalman Shan DO Entered By: Kalman Shan on 03/19/2022 15:57:46 Lefferts, Evin B. ([id number]) -------------------------------------------------------------------------------- Debridement Details Patient Name: Grace, Shaterria B. Date of Service: 03/19/2022 2:30 PM Medical Record Number: [id number] Patient Account Number: [id number] Date of Birth/Sex: 10/04/1950 (71 y.o. Female) Treating RN: Levora Dredge Primary Care Provider: Unk Pinto Other Clinician: Referring Provider: Lorenda Peck Treating Provider/Extender: Yaakov Guthrie in Treatment: 0 Debridement Performed for Wound #1 Left Foot Assessment: Performed By: Physician Kalman Shan, MD Debridement Type: Debridement Severity of Tissue Pre Debridement: Fat layer exposed Level of Consciousness (Pre- Awake and Alert procedure): Pre-procedure Verification/Time Out Yes - 15:26 Taken: Pain Control: Lidocaine 4% Topical Solution Total Area Debrided (L x W): 3 (cm) x 4 (cm) = 12 (cm) Tissue and other material Non-Viable, Callus, Slough, Subcutaneous, Slough debrided: Level: Skin/Subcutaneous Tissue Debridement Description: Excisional Instrument: Blade, Curette, Forceps Bleeding: None Response to Treatment: Procedure was tolerated well Level of Consciousness (Post- Awake and Alert procedure): Post  Debridement Measurements of Total Wound Length: (cm) 3 Width: (cm) 4 Depth: (cm) 0.4 Volume: (cm) 3.77 Character of Wound/Ulcer Post Debridement: Stable Severity of Tissue Post Debridement: Fat layer exposed Post Procedure Diagnosis Same as Pre-procedure Electronic Signature(s) Signed: 03/19/2022 4:19:14 PM By: Levora Dredge Signed: 03/19/2022 4:23:55 PM By: Kalman Shan DO Entered By: Levora Dredge on 03/19/2022 15:35:31 Clute, [place] ([id number]) -------------------------------------------------------------------------------- HPI Details Patient Name: Grace, [name] Date of Service: 03/19/2022 2:30 PM Medical Record Number: [id number] Patient Account Number: [id number] Date of Birth/Sex: [birth year] (71 y.o. Female) Treating RN: Levora Dredge Primary Care Provider: Unk Pinto Other Clinician: Referring Provider: Lorenda Peck Treating Provider/Extender: Yaakov Guthrie in Treatment: 0 History of Present Illness HPI Description: 03/19/2022 Ms. Keylen Uzelac is a 71 year old female with a past medical history of uncontrolled type 2 diabetes complicated by peripheral neuropathy that presents to the clinic for a left foot blister. She states that the blister presented 1 week ago. She wears leg braces because of peripheral neuropathy and has been doing so for the past 15 years. She denies any previous issues such as blisters or open wounds To her feet. She was referred by her primary care physician. Electronic Signature(s) Signed: 03/19/2022 4:23:55 PM By: Kalman Shan DO Entered By: Kalman Shan on 03/19/2022 15:59:44 Mixon, Saloma B. ([id number]) -------------------------------------------------------------------------------- Physical Exam Details Patient Name: Grace, [name] Date of Service: 03/19/2022 2:30 PM Medical Record Number: [id number] Patient Account Number: [id number] Date of Birth/Sex: October 24, 1950 (71 y.o. Female) Treating  RN: Levora Dredge Primary Care Provider: Unk Pinto Other Clinician: Referring Provider: Lorenda Peck Treating Provider/Extender: Yaakov Guthrie in Treatment: 0 Constitutional . Cardiovascular . Psychiatric . Notes Left foot: To the plantar aspect of the first met head there is a callus peeling up with an open wound underneath. This tracked to the lateral aspect where there is a large blister. Electronic Signature(s) Signed: 03/19/2022 4:23:55 PM By: Kalman Shan DO Entered By: Kalman Shan on 03/19/2022 16:06:02 Farooqui, Yerington B. ([id number]) -------------------------------------------------------------------------------- Physician Orders Details Patient Name: Grace, [name] Date of Service: 03/19/2022 2:30 PM Medical  Record Number: [id number] Patient Account Number: [id number] Date of Birth/Sex: 1950-10-02 (71 y.o. Female) Treating RN: Levora Dredge Primary Care Provider: Unk Pinto Other Clinician: Referring Provider: Lorenda Peck Treating Provider/Extender: Yaakov Guthrie in Treatment: 0 Verbal / Phone Orders: No Diagnosis Coding ICD-10 Coding Code Description [id number] Non-pressure chronic ulcer of other part of left foot with fat layer exposed E11.621 Type 2 diabetes mellitus with foot ulcer Follow-up Appointments o Return Appointment in 1 week. o Nurse Visit as needed [employer] o Wash wounds with antibacterial soap and water. o May shower with wound dressing protected with water repellent cover or cast protector. o No tub bath. Edema Control - Lymphedema / Segmental Compressive Device / Other o Elevate, Exercise Daily and Avoid Standing for Long Periods of Time. o Elevate leg(s) parallel to the floor when sitting. o DO YOUR BEST to sleep in the bed at night. DO NOT sleep in your recliner. Long hours of sitting in a recliner leads to swelling of the legs and/or potential wounds on your  backside. Off-Loading o Other: - please do you best to keep all pressure off on left bottom of foot at wound site Medications-Please add to medication list. o P.O. Antibiotics - please pick up and start taking Wound Treatment Wound #1 - Foot Wound Laterality: Left Cleanser: Byram Ancillary Kit - 15 Day Supply (Generic) 1 x Per Day/30 Days Discharge Instructions: Use supplies as instructed; Kit contains: (15) Saline Bullets; (15) 3x3 Gauze; 15 pr Gloves Cleanser: Soap and Water 1 x Per Day/30 Days Discharge Instructions: Gently cleanse wound with antibacterial soap, rinse and pat dry prior to dressing wounds Topical: Gentamicin 1 x Per Day/30 Days Discharge Instructions: Apply as directed by provider. IN OFFICE ONLY Primary Dressing: Silvercel Small 2x2 (in/in) (Generic) 1 x Per Day/30 Days Discharge Instructions: Apply Silvercel Small 2x2 (in/in) as instructed Secondary Dressing: Gauze (Generic) 1 x Per Day/30 Days Discharge Instructions: As directed: dry Secondary Dressing: felt pad 1 x Per Day/30 Days Discharge Instructions: used to relieve pressure Secured With: Medipore Tape - 62M Medipore H Soft Cloth Surgical Tape, 2x2 (in/yd) (Generic) 1 x Per Day/30 Days Secured With: Kerlix Roll Sterile or Non-Sterile 6-ply 4.5x4 (yd/yd) (Generic) 1 x Per Day/30 Days Discharge Instructions: Apply Kerlix as directed Secured With: Stretch Net Dressing, Latex-free, Size 5, Small-Head / Shoulder / Thigh 1 x Per Day/30 Days Discharge Instructions: size 4 used Holik, Nelda B. ([id number]) Patient Medications Allergies: gabapentin Notifications Medication Indication Start End doxycycline hyclate 03/19/2022 DOSE 1 - oral 100 mg tablet - 1 tablet oral BID x 7 days Electronic Signature(s) Signed: 03/19/2022 4:23:55 PM By: Kalman Shan DO Previous Signature: 03/19/2022 3:56:04 PM Version By: Kalman Shan DO Entered By: Kalman Shan on 03/19/2022 16:12:42 Testerman, Harrison B.  ([id number]) -------------------------------------------------------------------------------- Problem List Details Patient Name: Knock, Giavonna B. Date of Service: 03/19/2022 2:30 PM Medical Record Number: [id number] Patient Account Number: [id number] Date of Birth/Sex: February 07, 1951 (71 y.o. Female) Treating RN: Levora Dredge Primary Care Provider: Unk Pinto Other Clinician: Referring Provider: Lorenda Peck Treating Provider/Extender: Yaakov Guthrie in Treatment: 0 Active Problems ICD-10 Encounter Code Description Active Date MDM Diagnosis [phone] Non-pressure chronic ulcer of other part of left foot with fat layer 03/19/2022 No Yes exposed E11.621 Type 2 diabetes mellitus with foot ulcer 03/19/2022 No Yes E11.40 Type 2 diabetes mellitus with diabetic neuropathy, unspecified 03/19/2022 No Yes Inactive Problems Resolved Problems Electronic Signature(s) Signed: 03/19/2022 4:23:55 PM By: Kalman Shan DO Entered By: Kalman Shan on 03/19/2022 15:57:10  Arruda, Lonita B. ([id number]) -------------------------------------------------------------------------------- Progress Note Details Patient Name: Grace, [name] Date of Service: 03/19/2022 2:30 PM Medical Record Number: [id number] Patient Account Number: [id number] Date of Birth/Sex: January 22, 1951 (71 y.o. Female) Treating RN: Levora Dredge Primary Care Provider: Unk Pinto Other Clinician: Referring Provider: Lorenda Peck Treating Provider/Extender: Yaakov Guthrie in Treatment: 0 Subjective Chief Complaint Information obtained from Patient 03/19/2022; left foot wound History of Present Illness (HPI) 03/19/2022 Ms. Aujanae Mccullum is a 71 year old female with a past medical history of uncontrolled type 2 diabetes complicated by peripheral neuropathy that presents to the clinic for a left foot blister. She states that the blister presented 1 week ago. She wears leg braces because of  peripheral neuropathy and has been doing so for the past 15 years. She denies any previous issues such as blisters or open wounds To her feet. She was referred by her primary care physician. Patient History Information obtained from Patient. Allergies gabapentin (Reaction: swelling) Social History Never smoker, Alcohol Use - Never, Drug Use - No History, Caffeine Use - Daily. Medical History Respiratory Patient has history of Sleep Apnea Cardiovascular Patient has history of Hypertension Endocrine Patient has history of Type II Diabetes Neurologic Patient has history of Neuropathy Patient is treated with Oral Agents. Blood sugar is not tested. Review of Systems (ROS) Constitutional Symptoms (General Health) Denies complaints or symptoms of Fatigue, Fever, Chills, Marked Weight Change. Eyes Denies complaints or symptoms of Dry Eyes, Vision Changes, Glasses / Contacts. Ear/Nose/Mouth/Throat Denies complaints or symptoms of Difficult clearing ears, Sinusitis. Hematologic/Lymphatic Denies complaints or symptoms of Bleeding / Clotting Disorders, Human Immunodeficiency Virus. Gastrointestinal Denies complaints or symptoms of Frequent diarrhea, Nausea, Vomiting. Genitourinary CKD stage 3 Immunological Denies complaints or symptoms of Hives, Itching. Integumentary (Skin) Denies complaints or symptoms of Wounds, Bleeding or bruising tendency, Breakdown, Swelling. Musculoskeletal Denies complaints or symptoms of Muscle Pain, Muscle Weakness. Psychiatric Denies complaints or symptoms of Anxiety, Claustrophobia. [place], [place] ([id number]) Objective Constitutional Vitals Time Taken: 2:44 PM, Height: 63 in, Source: Stated, Weight: 160 lbs, Source: Stated, BMI: 28.3, Temperature: 97.7 F, Pulse: 75 bpm, Respiratory Rate: 18 breaths/min, Blood Pressure: 145/68 mmHg. General Notes: Left foot: To the plantar aspect of the first met head there is a callus peeling up with an open  wound underneath. This tracked to the lateral aspect where there is a large blister. Integumentary (Hair, Skin) Wound #1 status is Open. Original cause of wound was Blister. The date acquired was: 03/19/2022. The wound is located on the Left Foot. The wound measures 3cm length x 4cm width x 0.1cm depth; 9.425cm^2 area and 0.942cm^3 volume. There is no tunneling or undermining noted. There is a medium amount of serosanguineous drainage noted. There is no granulation within the wound bed. There is a large (67-100%) amount of necrotic tissue within the wound bed including Adherent Slough. Assessment Active Problems ICD-10 Non-pressure chronic ulcer of other part of left foot with fat layer exposed Type 2 diabetes mellitus with foot ulcer Type 2 diabetes mellitus with diabetic neuropathy, unspecified Patient presents with a 1 week history of blister formation to the lateral aspect of the left foot. She has a callus that was easily removed that tracks to the blister. A scab was next To the callus and I was able to remove this easily as well. The serous fluid was removed. I debrided nonviable tissue. At this time I kept the blister intact. I recommended doxycycline as she is high risk for infection due to being a diabetic  with peripheral neuropathy. I recommended silver alginate to the open wound.. She has mobility issues and uses a walker. We are unable to do a contact cast because of this. She also has braces on her legs that she reports she uses due to her diabetic neuropathy. I recommended aggressive offloading. I recommended she stay off her feet for the next week the best she can. We will give her a felt offloading pad. 46 minutes was spent on the encounter including face-to-face, EMR review and coronation of care Procedures Wound #1 Pre-procedure diagnosis of Wound #1 is a Diabetic Wound/Ulcer of the Lower Extremity located on the Left Foot .Severity of Tissue Pre Debridement is: Fat layer  exposed. There was a Excisional Skin/Subcutaneous Tissue Debridement with a total area of 12 sq cm performed by Kalman Shan, MD. With the following instrument(s): Blade, Curette, and Forceps to remove Non-Viable tissue/material. Material removed includes Callus, Subcutaneous Tissue, and Slough after achieving pain control using Lidocaine 4% Topical Solution. No specimens were taken. A time out was conducted at 15:26, prior to the start of the procedure. There was no bleeding. The procedure was tolerated well. Post Debridement Measurements: 3cm length x 4cm width x 0.4cm depth; 3.77cm^3 volume. Character of Wound/Ulcer Post Debridement is stable. Severity of Tissue Post Debridement is: Fat layer exposed. Post procedure Diagnosis Wound #1: Same as Pre-Procedure Plan Follow-up Appointments: Return Appointment in 1 week. Nurse Visit as needed Bathing/ Shower/ Hygiene: Wash wounds with antibacterial soap and water. May shower with wound dressing protected with water repellent cover or cast protector. No tub bath. Edema Control - Lymphedema / Segmental Compressive Device / Other: Lammert, Wannetta B. ([id number]) Elevate, Exercise Daily and Avoid Standing for Long Periods of Time. Elevate leg(s) parallel to the floor when sitting. DO YOUR BEST to sleep in the bed at night. DO NOT sleep in your recliner. Long hours of sitting in a recliner leads to swelling of the legs and/or potential wounds on your backside. Off-Loading: Other: - please do you best to keep all pressure off on left bottom of foot at wound site Medications-Please add to medication list.: P.O. Antibiotics - please pick up and start taking The following medication(s) was prescribed: doxycycline hyclate oral 100 mg tablet 1 1 tablet oral BID x 7 days starting 03/19/2022 WOUND #1: - Foot Wound Laterality: Left Cleanser: Byram Ancillary Kit - 15 Day Supply (Generic) 1 x Per Day/30 Days Discharge Instructions: Use supplies as  instructed; Kit contains: (15) Saline Bullets; (15) 3x3 Gauze; 15 pr Gloves Cleanser: Soap and Water 1 x Per Day/30 Days Discharge Instructions: Gently cleanse wound with antibacterial soap, rinse and pat dry prior to dressing wounds Topical: Gentamicin 1 x Per Day/30 Days Discharge Instructions: Apply as directed by provider. IN OFFICE ONLY Primary Dressing: Silvercel Small 2x2 (in/in) (Generic) 1 x Per Day/30 Days Discharge Instructions: Apply Silvercel Small 2x2 (in/in) as instructed Secondary Dressing: Gauze (Generic) 1 x Per Day/30 Days Discharge Instructions: As directed: dry Secondary Dressing: felt pad 1 x Per Day/30 Days Discharge Instructions: used to relieve pressure Secured With: Medipore Tape - 77M Medipore H Soft Cloth Surgical Tape, 2x2 (in/yd) (Generic) 1 x Per Day/30 Days Secured With: Kerlix Roll Sterile or Non-Sterile 6-ply 4.5x4 (yd/yd) (Generic) 1 x Per Day/30 Days Discharge Instructions: Apply Kerlix as directed Secured With: Stretch Net Dressing, Latex-free, Size 5, Small-Head / Shoulder / Thigh 1 x Per Day/30 Days Discharge Instructions: size 4 used 1. In office sharp debridement 2. Silver alginate 3. Doxycycline  4. Follow-up in 1 week Electronic Signature(s) Signed: 03/19/2022 4:23:55 PM By: Kalman Shan DO Entered By: Kalman Shan on 03/19/2022 16:12:21 Ent, Khiana B. ([id number]) -------------------------------------------------------------------------------- ROS/PFSH Details Patient Name: Grace, [name] Date of Service: 03/19/2022 2:30 PM Medical Record Number: [id number] Patient Account Number: [id number] Date of Birth/Sex: 03-Mar-1951 (71 y.o. Female) Treating RN: Levora Dredge Primary Care Provider: Unk Pinto Other Clinician: Referring Provider: Lorenda Peck Treating Provider/Extender: Yaakov Guthrie in Treatment: 0 Information Obtained From Patient Constitutional Symptoms (General Health) Complaints and  Symptoms: Negative for: Fatigue; Fever; Chills; Marked Weight Change Eyes Complaints and Symptoms: Negative for: Dry Eyes; Vision Changes; Glasses / Contacts Ear/Nose/Mouth/Throat Complaints and Symptoms: Negative for: Difficult clearing ears; Sinusitis Hematologic/Lymphatic Complaints and Symptoms: Negative for: Bleeding / Clotting Disorders; Human Immunodeficiency Virus Gastrointestinal Complaints and Symptoms: Negative for: Frequent diarrhea; Nausea; Vomiting Immunological Complaints and Symptoms: Negative for: Hives; Itching Integumentary (Skin) Complaints and Symptoms: Negative for: Wounds; Bleeding or bruising tendency; Breakdown; Swelling Musculoskeletal Complaints and Symptoms: Negative for: Muscle Pain; Muscle Weakness Psychiatric Complaints and Symptoms: Negative for: Anxiety; Claustrophobia Respiratory Medical History: Positive for: Sleep Apnea Cardiovascular Medical History: Positive for: Hypertension Mataya, Chellsie B. ([id number]) Endocrine Medical History: Positive for: Type II Diabetes Time with diabetes: 15 years Treated with: Oral agents Blood sugar tested every day: No Genitourinary Complaints and Symptoms: Review of System Notes: CKD stage 3 Neurologic Medical History: Positive for: Neuropathy Oncologic Immunizations Pneumococcal Vaccine: Received Pneumococcal Vaccination: Yes Received Pneumococcal Vaccination On or After 60th Birthday: Yes Implantable Devices None Family and Social History Never smoker; Alcohol Use: Never; Drug Use: No History; Caffeine Use: Daily Electronic Signature(s) Signed: 03/19/2022 4:19:14 PM By: Levora Dredge Signed: 03/19/2022 4:23:55 PM By: Kalman Shan DO Entered By: Levora Dredge on 03/19/2022 14:47:52 Javed, Hisayo B. ([id number]) -------------------------------------------------------------------------------- SuperBill Details Patient Name: Grace, [name] Date of Service:  03/19/2022 Medical Record Number: [id number] Patient Account Number: [id number] Date of Birth/Sex: 02-07-1951 (71 y.o. Female) Treating RN: Levora Dredge Primary Care Provider: Unk Pinto Other Clinician: Referring Provider: Lorenda Peck Treating Provider/Extender: Yaakov Guthrie in Treatment: 0 Diagnosis Coding ICD-10 Codes Code Description [id number] Non-pressure chronic ulcer of other part of left foot with fat layer exposed E11.621 Type 2 diabetes mellitus with foot ulcer E11.40 Type 2 diabetes mellitus with diabetic neuropathy, unspecified Facility Procedures CPT4 Code: 08811031 Description: 59458 - WOUND CARE VISIT-LEV 3 EST PT Modifier: Quantity: 1 CPT4 Code: [id number] Description: 28638 - DEB SUBQ TISSUE 20 SQ CM/< Modifier: Quantity: 1 CPT4 Code: Description: ICD-10 Diagnosis Description L97.522 Non-pressure chronic ulcer of other part of left foot with fat layer expo Modifier: sed Quantity: Physician Procedures CPT4 Code: 1771165 Description: 79038 - WC PHYS LEVEL 4 - NEW PT Modifier: Quantity: 1 CPT4 Code: Description: ICD-10 Diagnosis Description L97.522 Non-pressure chronic ulcer of other part of left foot with fat layer exp E11.621 Type 2 diabetes mellitus with foot ulcer E11.40 Type 2 diabetes mellitus with diabetic neuropathy, unspecified Modifier: osed Quantity: CPT4 Code: 3338329 Description: 11042 - WC PHYS SUBQ TISS 20 SQ CM Modifier: Quantity: 1 CPT4 Code: Description: ICD-10 Diagnosis Description L97.522 Non-pressure chronic ulcer of other part of left foot with fat layer exp Modifier: osed Quantity: Electronic Signature(s) Signed: 03/19/2022 4:23:55 PM By: Kalman Shan DO Entered By: Kalman Shan on 03/19/2022 16:12:29

## 2022-03-19 NOTE — Progress Notes (Signed)
JARELYN, BAMBACH ([id number]) Visit Report for 03/19/2022 Abuse Risk Screen Details Patient Name: [name], [name] Date of Service: 03/19/2022 2:30 PM Medical Record Number: [id number] Patient Account Number: [id number] Date of Birth/Sex: June 06, 1951 (71 y.o. Female) Treating RN: Levora Dredge Primary Care Jahara Dail: Unk Pinto Other Clinician: Referring Emmelina Mcloughlin: Lorenda Peck Treating Christa Fasig/Extender: Yaakov Guthrie in Treatment: 0 Abuse Risk Screen Items Answer ABUSE RISK SCREEN: Has anyone close to you tried to hurt or harm you recentlyo No Do you feel uncomfortable with anyone in your familyo No Has anyone forced you do things that you didnot want to doo No Electronic Signature(s) Signed: 03/19/2022 4:19:14 PM By: Levora Dredge Entered By: Levora Dredge on 03/19/2022 14:48:00 Flink, [name] ([id number]) -------------------------------------------------------------------------------- Activities of Daily Living Details Patient Name: Grace [name], [name] Date of Service: 03/19/2022 2:30 PM Medical Record Number: [id number] Patient Account Number: [id number] Date of Birth/Sex: 11-Jan-1951 (71 y.o. Female) Treating RN: Levora Dredge Primary Care Jayleah Garbers: Unk Pinto Other Clinician: Referring Markes Shatswell: Lorenda Peck Treating Cortlin Marano/Extender: Yaakov Guthrie in Treatment: 0 Activities of Daily Living Items Answer Activities of Daily Living (Please select one for each item) Drive Automobile Not Able Take Medications Completely Able Use Telephone Completely Able Care for Appearance Completely Able Use Toilet Completely Able Bath / Shower Completely Able Dress Self Completely Able Feed Self Completely Able Walk Need Assistance Get In / Out Bed Completely Able Housework Completely Able Prepare Meals Completely [place] for Self Completely Able Electronic Signature(s) Signed: 03/19/2022 4:19:14 PM By:  Levora Dredge Entered By: Levora Dredge on 03/19/2022 14:48:32 Ipock, Chanel B. ([id number]) -------------------------------------------------------------------------------- Education Screening Details Patient Name: Grace [name], [name] Date of Service: 03/19/2022 2:30 PM Medical Record Number: [id number] Patient Account Number: [id number] Date of Birth/Sex: 03/14/1951 (71 y.o. Female) Treating RN: Levora Dredge Primary Care Starasia Sinko: Unk Pinto Other Clinician: Referring Taleisha Kaczynski: Lorenda Peck Treating Devan Babino/Extender: Yaakov Guthrie in Treatment: 0 Learning Preferences/Education Level/Primary Language Learning Preference: Explanation, Demonstration, Video, Communication Board, Printed Material Preferred Language: English Cognitive Barrier Language Barrier: No Translator Needed: No Memory Deficit: No Emotional Barrier: No Cultural/Religious Beliefs Affecting Medical Care: No Physical Barrier Impaired Vision: Yes poor Impaired Hearing: No Decreased Hand dexterity: No Knowledge/Comprehension Knowledge Level: High Comprehension Level: High Ability to understand written instructions: High Ability to understand verbal instructions: High Motivation Anxiety Level: Calm Cooperation: Cooperative Education Importance: Acknowledges Need Interest in Health Problems: Asks Questions Perception: Coherent Willingness to Engage in Self-Management High Activities: Readiness to Engage in Self-Management High Activities: Electronic Signature(s) Signed: 03/19/2022 4:19:14 PM By: Levora Dredge Entered By: Levora Dredge on 03/19/2022 14:48:56 Angelucci, Monaye B. ([id number]) -------------------------------------------------------------------------------- Fall Risk Assessment Details Patient Name: Grace [name], [name] Date of Service: 03/19/2022 2:30 PM Medical Record Number: [id number] Patient Account Number: [id number] Date of Birth/Sex: Aug 25, 1950 (71 y.o.  Female) Treating RN: Levora Dredge Primary Care Sydney Azure: Unk Pinto Other Clinician: Referring Tiberius Loftus: Lorenda Peck Treating Ariane Ditullio/Extender: Yaakov Guthrie in Treatment: 0 Fall Risk Assessment Items Have you had 2 or more falls in the last 12 monthso 0 No Have you had any fall that resulted in injury in the last 12 monthso 0 No FALLS RISK SCREEN History of falling - immediate or within 3 months 0 No Secondary diagnosis (Do you have 2 or more medical diagnoseso) 0 No Ambulatory aid None/bed rest/wheelchair/nurse 0 Yes Crutches/cane/walker 0 No Furniture 0 No Intravenous therapy Access/Saline/Heparin Lock 0 No Gait/Transferring Normal/ bed rest/ wheelchair 0 Yes Weak (short steps with or without shuffle, stooped but able  to lift head while walking, may 0 No seek support from furniture) Impaired (short steps with shuffle, may have difficulty arising from chair, head down, impaired 0 No balance) Mental Status Oriented to own ability 0 Yes Electronic Signature(s) Signed: 03/19/2022 4:19:14 PM By: Levora Dredge Entered By: Levora Dredge on 03/19/2022 14:49:18 Ey, [place]. ([id number]) -------------------------------------------------------------------------------- Foot Assessment Details Patient Name: Grace [name], [name] Date of Service: 03/19/2022 2:30 PM Medical Record Number: [id number] Patient Account Number: [id number] Date of Birth/Sex: 1950/10/01 (71 y.o. Female) Treating RN: Levora Dredge Primary Care Angelique Chevalier: Unk Pinto Other Clinician: Referring Shanetha Bradham: Lorenda Peck Treating Jeydi Klingel/Extender: Yaakov Guthrie in Treatment: 0 Foot Assessment Items Site Locations + = Sensation present, - = Sensation absent, C = Callus, U = Ulcer R = Redness, W = Warmth, M = Maceration, PU = Pre-ulcerative lesion F = Fissure, S = Swelling, D = Dryness Assessment Right: Left: Other Deformity: No No Prior Foot Ulcer: No No Prior  Amputation: No No Charcot Joint: No No Ambulatory Status: Ambulatory With Help Assistance Device: Walker Gait: Steady Electronic Signature(s) Signed: 03/19/2022 4:19:14 PM By: Levora Dredge Entered By: Levora Dredge on 03/19/2022 14:54:50 Grace [name], [name] ([id number]) -------------------------------------------------------------------------------- Nutrition Risk Screening Details Patient Name: Grace [name], [name] Date of Service: 03/19/2022 2:30 PM Medical Record Number: [id number] Patient Account Number: [id number] Date of Birth/Sex: 13-May-1951 (71 y.o. Female) Treating RN: Levora Dredge Primary Care Konstantine Gervasi: Unk Pinto Other Clinician: Referring Leocadio Heal: Lorenda Peck Treating Alika Eppes/Extender: Yaakov Guthrie in Treatment: 0 Height (in): 63 Weight (lbs): 160 Body Mass Index (BMI): 28.3 Nutrition Risk Screening Items Score Screening NUTRITION RISK SCREEN: I have an illness or condition that made me change the kind and/or amount of food I eat 0 No I eat fewer than two meals per day 0 No I eat few fruits and vegetables, or milk products 0 No I have three or more drinks of beer, liquor or wine almost every day 0 No I have tooth or mouth problems that make it hard for me to eat 0 No I don't always have enough money to buy the food I need 0 No I eat alone most of the time 0 No I take three or more different prescribed or over-the-counter drugs a day 0 No Without wanting to, I have lost or gained 10 pounds in the last six months 0 No I am not always physically able to shop, cook and/or feed myself 0 No Nutrition Protocols Good Risk Protocol 0 No interventions needed Moderate Risk Protocol High Risk Proctocol Risk Level: Good Risk Score: 0 Electronic Signature(s) Signed: 03/19/2022 4:19:14 PM By: Levora Dredge Entered By: Levora Dredge on 03/19/2022 14:49:36

## 2022-03-20 LAB — COMPLETE METABOLIC PANEL WITH GFR
AG Ratio: 1.7 (calc) (ref 1.0–2.5)
ALT: 14 U/L (ref 6–29)
AST: 14 U/L (ref 10–35)
Albumin: 3.9 g/dL (ref 3.6–5.1)
Alkaline phosphatase (APISO): 56 U/L (ref 37–153)
BUN/Creatinine Ratio: 21 (calc) (ref 6–22)
BUN: 22 mg/dL (ref 7–25)
CO2: 30 mmol/L (ref 20–32)
Calcium: 9.8 mg/dL (ref 8.6–10.4)
Chloride: 99 mmol/L (ref 98–110)
Creat: 1.07 mg/dL — ABNORMAL HIGH (ref 0.60–1.00)
Globulin: 2.3 g/dL (calc) (ref 1.9–3.7)
Glucose, Bld: 198 mg/dL — ABNORMAL HIGH (ref 65–99)
Potassium: 5.7 mmol/L — ABNORMAL HIGH (ref 3.5–5.3)
Sodium: 138 mmol/L (ref 135–146)
Total Bilirubin: 0.3 mg/dL (ref 0.2–1.2)
Total Protein: 6.2 g/dL (ref 6.1–8.1)
eGFR: 56 mL/min/{1.73_m2} — ABNORMAL LOW (ref >=60)

## 2022-03-20 LAB — URINALYSIS, ROUTINE W REFLEX MICROSCOPIC
Bilirubin Urine: NEGATIVE
Glucose, UA: NEGATIVE
Hgb urine dipstick: NEGATIVE
Ketones, ur: NEGATIVE
Nitrite: NEGATIVE
RBC / HPF: NONE SEEN /HPF (ref 0–2)
Specific Gravity, Urine: 1.016 (ref 1.001–1.035)
pH: 6 (ref 5.0–8.0)

## 2022-03-20 LAB — CBC WITH DIFFERENTIAL/PLATELET
Absolute Monocytes: 531 cells/uL (ref 200–950)
Basophils Absolute: 26 cells/uL (ref 0–200)
Basophils Relative: 0.3 %
Eosinophils Absolute: 104 cells/uL (ref 15–500)
Eosinophils Relative: 1.2 %
HCT: 37.6 % (ref 35.0–45.0)
Hemoglobin: 12.3 g/dL (ref 11.7–15.5)
Lymphs Abs: 1862 cells/uL (ref 850–3900)
MCH: 30.8 pg (ref 27.0–33.0)
MCHC: 32.7 g/dL (ref 32.0–36.0)
MCV: 94 fL (ref 80.0–100.0)
MPV: 9.4 fL (ref 7.5–12.5)
Monocytes Relative: 6.1 %
Neutro Abs: 6177 cells/uL (ref 1500–7800)
Neutrophils Relative %: 71 %
Platelets: 405 10*3/uL — ABNORMAL HIGH (ref 140–400)
RBC: 4 10*6/uL (ref 3.80–5.10)
RDW: 13.3 % (ref 11.0–15.0)
Total Lymphocyte: 21.4 %
WBC: 8.7 10*3/uL (ref 3.8–10.8)

## 2022-03-20 LAB — URINE CULTURE
MICRO NUMBER:: 13721098
SPECIMEN QUALITY:: ADEQUATE

## 2022-03-20 LAB — MICROSCOPIC MESSAGE

## 2022-03-21 ENCOUNTER — Other Ambulatory Visit: Payer: Self-pay | Admitting: Nurse Practitioner

## 2022-03-24 ENCOUNTER — Emergency Department (HOSPITAL_COMMUNITY): Payer: Medicare Other

## 2022-03-24 ENCOUNTER — Encounter (HOSPITAL_COMMUNITY): Payer: Self-pay | Admitting: Emergency Medicine

## 2022-03-24 ENCOUNTER — Inpatient Hospital Stay (HOSPITAL_COMMUNITY)
Admission: EM | Admit: 2022-03-24 | Discharge: 2022-03-27 | DRG: 872 | Disposition: A | Discharge status: Home Health | Payer: Medicare Other | Attending: Internal Medicine | Admitting: Internal Medicine

## 2022-03-24 DIAGNOSIS — E876 Hypokalemia: Secondary | ICD-10-CM | POA: Diagnosis present

## 2022-03-24 DIAGNOSIS — L03116 Cellulitis of left lower limb: Secondary | ICD-10-CM | POA: Diagnosis present

## 2022-03-24 DIAGNOSIS — Z20822 Contact with and (suspected) exposure to covid-19: Secondary | ICD-10-CM | POA: Diagnosis present

## 2022-03-24 DIAGNOSIS — R531 Weakness: Secondary | ICD-10-CM

## 2022-03-24 DIAGNOSIS — E113511 Type 2 diabetes mellitus with proliferative diabetic retinopathy with macular edema, right eye: Secondary | ICD-10-CM | POA: Diagnosis not present

## 2022-03-24 DIAGNOSIS — E1165 Type 2 diabetes mellitus with hyperglycemia: Secondary | ICD-10-CM | POA: Diagnosis present

## 2022-03-24 DIAGNOSIS — Z87440 Personal history of urinary (tract) infections: Secondary | ICD-10-CM

## 2022-03-24 DIAGNOSIS — M21372 Foot drop, left foot: Secondary | ICD-10-CM | POA: Diagnosis present

## 2022-03-24 DIAGNOSIS — K760 Fatty (change of) liver, not elsewhere classified: Secondary | ICD-10-CM | POA: Diagnosis present

## 2022-03-24 DIAGNOSIS — I129 Hypertensive chronic kidney disease with stage 1 through stage 4 chronic kidney disease, or unspecified chronic kidney disease: Secondary | ICD-10-CM | POA: Diagnosis present

## 2022-03-24 DIAGNOSIS — R109 Unspecified abdominal pain: Secondary | ICD-10-CM | POA: Diagnosis not present

## 2022-03-24 DIAGNOSIS — Z888 Allergy status to other drugs, medicaments and biological substances status: Secondary | ICD-10-CM

## 2022-03-24 DIAGNOSIS — N179 Acute kidney failure, unspecified: Secondary | ICD-10-CM | POA: Diagnosis present

## 2022-03-24 DIAGNOSIS — R739 Hyperglycemia, unspecified: Secondary | ICD-10-CM | POA: Diagnosis not present

## 2022-03-24 DIAGNOSIS — E872 Acidosis, unspecified: Secondary | ICD-10-CM | POA: Diagnosis present

## 2022-03-24 DIAGNOSIS — M21371 Foot drop, right foot: Secondary | ICD-10-CM | POA: Diagnosis present

## 2022-03-24 DIAGNOSIS — M7989 Other specified soft tissue disorders: Secondary | ICD-10-CM | POA: Diagnosis not present

## 2022-03-24 DIAGNOSIS — N200 Calculus of kidney: Secondary | ICD-10-CM | POA: Diagnosis not present

## 2022-03-24 DIAGNOSIS — I10 Essential (primary) hypertension: Secondary | ICD-10-CM | POA: Diagnosis not present

## 2022-03-24 DIAGNOSIS — R6520 Severe sepsis without septic shock: Secondary | ICD-10-CM | POA: Diagnosis present

## 2022-03-24 DIAGNOSIS — E11621 Type 2 diabetes mellitus with foot ulcer: Secondary | ICD-10-CM | POA: Diagnosis not present

## 2022-03-24 DIAGNOSIS — E11311 Type 2 diabetes mellitus with unspecified diabetic retinopathy with macular edema: Secondary | ICD-10-CM | POA: Diagnosis present

## 2022-03-24 DIAGNOSIS — G4733 Obstructive sleep apnea (adult) (pediatric): Secondary | ICD-10-CM

## 2022-03-24 DIAGNOSIS — Z7984 Long term (current) use of oral hypoglycemic drugs: Secondary | ICD-10-CM

## 2022-03-24 DIAGNOSIS — A419 Sepsis, unspecified organism: Principal | ICD-10-CM | POA: Diagnosis present

## 2022-03-24 DIAGNOSIS — K449 Diaphragmatic hernia without obstruction or gangrene: Secondary | ICD-10-CM | POA: Diagnosis not present

## 2022-03-24 DIAGNOSIS — Z96641 Presence of right artificial hip joint: Secondary | ICD-10-CM | POA: Diagnosis present

## 2022-03-24 DIAGNOSIS — I700 Atherosclerosis of aorta: Secondary | ICD-10-CM | POA: Diagnosis not present

## 2022-03-24 DIAGNOSIS — L03211 Cellulitis of face: Secondary | ICD-10-CM | POA: Diagnosis present

## 2022-03-24 DIAGNOSIS — Z803 Family history of malignant neoplasm of breast: Secondary | ICD-10-CM | POA: Diagnosis not present

## 2022-03-24 DIAGNOSIS — E119 Type 2 diabetes mellitus without complications: Secondary | ICD-10-CM

## 2022-03-24 DIAGNOSIS — Z833 Family history of diabetes mellitus: Secondary | ICD-10-CM

## 2022-03-24 DIAGNOSIS — K8020 Calculus of gallbladder without cholecystitis without obstruction: Secondary | ICD-10-CM | POA: Diagnosis not present

## 2022-03-24 DIAGNOSIS — R509 Fever, unspecified: Principal | ICD-10-CM

## 2022-03-24 DIAGNOSIS — E782 Mixed hyperlipidemia: Secondary | ICD-10-CM | POA: Diagnosis present

## 2022-03-24 DIAGNOSIS — E1122 Type 2 diabetes mellitus with diabetic chronic kidney disease: Secondary | ICD-10-CM | POA: Diagnosis not present

## 2022-03-24 DIAGNOSIS — N182 Chronic kidney disease, stage 2 (mild): Secondary | ICD-10-CM | POA: Diagnosis not present

## 2022-03-24 DIAGNOSIS — L0390 Cellulitis, unspecified: Secondary | ICD-10-CM | POA: Diagnosis present

## 2022-03-24 DIAGNOSIS — E1142 Type 2 diabetes mellitus with diabetic polyneuropathy: Secondary | ICD-10-CM | POA: Diagnosis present

## 2022-03-24 DIAGNOSIS — R1110 Vomiting, unspecified: Secondary | ICD-10-CM | POA: Diagnosis not present

## 2022-03-24 DIAGNOSIS — K4020 Bilateral inguinal hernia, without obstruction or gangrene, not specified as recurrent: Secondary | ICD-10-CM | POA: Diagnosis not present

## 2022-03-24 DIAGNOSIS — E1151 Type 2 diabetes mellitus with diabetic peripheral angiopathy without gangrene: Secondary | ICD-10-CM | POA: Diagnosis present

## 2022-03-24 DIAGNOSIS — N281 Cyst of kidney, acquired: Secondary | ICD-10-CM | POA: Diagnosis not present

## 2022-03-24 DIAGNOSIS — R600 Localized edema: Secondary | ICD-10-CM | POA: Diagnosis not present

## 2022-03-24 LAB — CBC
HCT: 40.1 % (ref 36.0–46.0)
Hemoglobin: 13.3 g/dL (ref 12.0–15.0)
MCH: 30.8 pg (ref 26.0–34.0)
MCHC: 33.2 g/dL (ref 30.0–36.0)
MCV: 92.8 fL (ref 80.0–100.0)
Platelets: 347 10*3/uL (ref 150–400)
RBC: 4.32 MIL/uL (ref 3.87–5.11)
RDW: 13.3 % (ref 11.5–15.5)
WBC: 10 10*3/uL (ref 4.0–10.5)
nRBC: 0 % (ref 0.0–0.2)

## 2022-03-24 LAB — URINALYSIS, ROUTINE W REFLEX MICROSCOPIC
Bilirubin Urine: NEGATIVE
Glucose, UA: >=500 mg/dL — AB
Hgb urine dipstick: NEGATIVE
Ketones, ur: 20 mg/dL — AB
Leukocytes,Ua: NEGATIVE
Nitrite: NEGATIVE
Protein, ur: 100 mg/dL — AB
Specific Gravity, Urine: 1.031 — ABNORMAL HIGH (ref 1.005–1.030)
pH: 5 (ref 5.0–8.0)

## 2022-03-24 LAB — RESP PANEL BY RT-PCR (FLU A&B, COVID) ARPGX2
Influenza A by PCR: NEGATIVE
Influenza B by PCR: NEGATIVE
SARS Coronavirus 2 by RT PCR: NEGATIVE

## 2022-03-24 LAB — COMPREHENSIVE METABOLIC PANEL
ALT: 45 U/L — ABNORMAL HIGH (ref 0–44)
AST: 48 U/L — ABNORMAL HIGH (ref 15–41)
Albumin: 3 g/dL — ABNORMAL LOW (ref 3.5–5.0)
Alkaline Phosphatase: 54 U/L (ref 38–126)
Anion gap: 10 (ref 5–15)
BUN: 20 mg/dL (ref 8–23)
CO2: 28 mmol/L (ref 22–32)
Calcium: 9 mg/dL (ref 8.9–10.3)
Chloride: 93 mmol/L — ABNORMAL LOW (ref 98–111)
Creatinine, Ser: 1.38 mg/dL — ABNORMAL HIGH (ref 0.44–1.00)
GFR, Estimated: 41 mL/min — ABNORMAL LOW (ref >60)
Glucose, Bld: 324 mg/dL — ABNORMAL HIGH (ref 70–99)
Potassium: 4.5 mmol/L (ref 3.5–5.1)
Sodium: 131 mmol/L — ABNORMAL LOW (ref 135–145)
Total Bilirubin: 0.2 mg/dL — ABNORMAL LOW (ref 0.3–1.2)
Total Protein: 6.1 g/dL — ABNORMAL LOW (ref 6.5–8.1)

## 2022-03-24 LAB — CBG MONITORING, ED: Glucose-Capillary: 331 mg/dL — ABNORMAL HIGH (ref 70–99)

## 2022-03-24 MED ORDER — LACTATED RINGERS IV BOLUS
1000.0000 mL | Freq: Once | INTRAVENOUS | Status: AC
  Administered 2022-03-24: 1000 mL via INTRAVENOUS

## 2022-03-24 MED ORDER — LACTATED RINGERS IV BOLUS
500.0000 mL | Freq: Once | INTRAVENOUS | Status: AC
  Administered 2022-03-25: 500 mL via INTRAVENOUS

## 2022-03-24 MED ORDER — IOHEXOL 300 MG/ML  SOLN
80.0000 mL | Freq: Once | INTRAMUSCULAR | Status: AC | PRN
  Administered 2022-03-24: 80 mL via INTRAVENOUS

## 2022-03-24 MED ORDER — ACETAMINOPHEN 325 MG PO TABS
650.0000 mg | ORAL_TABLET | Freq: Once | ORAL | Status: AC
  Administered 2022-03-24: 650 mg via ORAL
  Filled 2022-03-24: qty 2

## 2022-03-24 NOTE — ED Notes (Signed)
Unable to obtain 2nd set of blood cultures, only lactic sample.

## 2022-03-24 NOTE — ED Notes (Signed)
Bladder scan 63 ml

## 2022-03-24 NOTE — ED Provider Triage Note (Signed)
Emergency Medicine Provider Triage Evaluation Note  Grace [name] , a 71 y.o. female  was evaluated in triage.  Pt complains of generalized weakness.  Patient was diagnosed with a urinary tract infection last week and has been taking antibiotics.  She states that over the weekend she has had weakness which is generalized in nature and has been worsening.  She denies dysuria, chest pain, shortness of breath, abdominal pain at this time.  Patient has been taking Macrobid.  Patient is febrile in triage at 102.5 Fahrenheit  Review of Systems  Positive: Weakness Negative: Abdominal pain, dysuria, shortness of breath  Physical Exam  BP (!) 166/139 (BP Location: Right Arm)   Pulse 84   Temp (!) 102.5 F (39.2 C) (Oral)   Resp 20   SpO2 96%  Gen:   Awake, no distress   Resp:  Normal effort  MSK:   Moves extremities without difficulty  Other:    Medical Decision Making  Medically screening exam initiated at 1:12 PM.  Appropriate orders placed.  SHARMAYNE JABLON was informed that the remainder of the evaluation will be completed by another provider, this initial triage assessment does not replace that evaluation, and the importance of remaining in the ED until their evaluation is complete.     Dorothyann Peng, PA-C 03/24/22 1317

## 2022-03-24 NOTE — ED Notes (Signed)
Unable to get urine at 

## 2022-03-24 NOTE — ED Triage Notes (Signed)
Patient BIB [facility] from home for evaluation of generalized weakness that started after  being diagnosed with a UTI last Tuesday. Patient is alert, oriented, and in no apparent distress at this time.

## 2022-03-24 NOTE — ED Notes (Signed)
Patient transported to X-ray 

## 2022-03-24 NOTE — ED Notes (Signed)
Pt assisted to bathroom via wheelchair by this RN. Increased weakness noted. Pt unable to urinate

## 2022-03-24 NOTE — ED Notes (Signed)
331 CBG

## 2022-03-24 NOTE — ED Provider Notes (Signed)
[facility] EMERGENCY DEPARTMENT Provider Note   CSN: [id number] Arrival date & time: 03/24/22  1240     History {Add pertinent medical, surgical, social history, OB history to HPI:1} Chief Complaint  Patient presents with   Weakness    Grace [name] is a 71 y.o. female.  Presented to the emergency room due to concern for  HPI     Home Medications Prior to Admission medications   Medication Sig Start Date End Date Taking? Authorizing Provider  acetaminophen (TYLENOL) 500 MG tablet Take 1,500 mg by mouth every 8 (eight) hours as needed for moderate pain.    [provider]  blood glucose meter kit and supplies KIT Dispense based on patient and insurance preference. Use up to four times daily as directed. 03/18/22   Alycia Rossetti, NP  Cholecalciferol (DIALYVITE VITAMIN D 5000) 125 MCG (5000 UT) capsule Take 5,000 Units by mouth daily.    [provider]  DULoxetine (CYMBALTA) 30 MG capsule Take 1 capsule (30 mg total) by mouth daily. 01/22/22   Suzzanne Cloud, NP  glimepiride (AMARYL) 2 MG tablet Take  1 tablet  2 x /day  with Meals  for Diabetes Patient taking differently: Take 2 mg by mouth daily. 02/03/21   Unk Pinto, MD  glucose blood (ACCU-CHEK GUIDE) test strip Check blood sugar 3 times a day-DX-E11.69 AND E78.5. 11/10/19   Unk Pinto, MD  Magnesium 250 MG TABS Take 500 mg by mouth daily.    [provider]  metFORMIN (GLUCOPHAGE-XR) 500 MG 24 hr tablet TAKE 2 TABLETS BY MOUTH TWICE DAILY FOR DIABETES 03/21/22   Alycia Rossetti, NP  nitrofurantoin, macrocrystal-monohydrate, (MACROBID) 100 MG capsule Take 1 capsule (100 mg total) by mouth 2 (two) times daily for 7 days. 03/18/22 03/25/22  Alycia Rossetti, NP  Omega-3 350 MG CPDR Take 350 mg by mouth 2 (two) times daily.    [provider]  zinc gluconate 50 MG tablet Take 50 mg by mouth daily.    [provider]      Allergies    Gabapentin     Review of Systems   Review of Systems  Physical Exam Updated Vital Signs BP (!) 113/55   Pulse 77   Temp 98.5 F (36.9 C) (Oral)   Resp 16   SpO2 100%  Physical Exam  ED Results / Procedures / Treatments   Labs (all labs ordered are listed, but only abnormal results are displayed) Labs Reviewed  CBG MONITORING, ED - Abnormal; Notable for the following components:      Result Value   Glucose-Capillary 331 (*)    All other components within normal limits  URINE CULTURE  CULTURE, BLOOD (ROUTINE X 2)  CULTURE, BLOOD (ROUTINE X 2)  RESP PANEL BY RT-PCR (FLU A&B, COVID) ARPGX2  CBC  URINALYSIS, ROUTINE W REFLEX MICROSCOPIC  COMPREHENSIVE METABOLIC PANEL  LACTIC ACID, PLASMA  LACTIC ACID, PLASMA    EKG None  Radiology No results found.  Procedures Procedures  {Document cardiac monitor, telemetry assessment procedure when appropriate:1}  Medications Ordered in ED Medications  lactated ringers bolus 1,000 mL (has no administration in time range)  acetaminophen (TYLENOL) tablet 650 mg (650 mg Oral Given 03/24/22 1329)    ED Course/ Medical Decision Making/ A&P                           Medical Decision Making Amount and/or Complexity of  Data Reviewed Labs: ordered. Radiology: ordered.  Risk OTC drugs.   ***  {Document critical care time when appropriate:1} {Document review of labs and clinical decision tools ie heart score, Chads2Vasc2 etc:1}  {Document your independent review of radiology images, and any outside records:1} {Document your discussion with family members, caretakers, and with consultants:1} {Document social determinants of health affecting pt's care:1} {Document your decision making why or why not admission, treatments were needed:1} Final Clinical Impression(s) / ED Diagnoses Final diagnoses:  None    Rx / DC Orders ED Discharge Orders     None

## 2022-03-24 NOTE — ED Notes (Signed)
Patient transported to CT 

## 2022-03-25 ENCOUNTER — Inpatient Hospital Stay (HOSPITAL_COMMUNITY): Payer: Medicare Other

## 2022-03-25 ENCOUNTER — Emergency Department (HOSPITAL_COMMUNITY): Payer: Medicare Other

## 2022-03-25 ENCOUNTER — Encounter (HOSPITAL_COMMUNITY): Payer: Self-pay | Admitting: Internal Medicine

## 2022-03-25 ENCOUNTER — Other Ambulatory Visit: Payer: Self-pay

## 2022-03-25 ENCOUNTER — Observation Stay (HOSPITAL_COMMUNITY): Payer: Medicare Other

## 2022-03-25 DIAGNOSIS — Z20822 Contact with and (suspected) exposure to covid-19: Secondary | ICD-10-CM | POA: Diagnosis present

## 2022-03-25 DIAGNOSIS — I129 Hypertensive chronic kidney disease with stage 1 through stage 4 chronic kidney disease, or unspecified chronic kidney disease: Secondary | ICD-10-CM | POA: Diagnosis present

## 2022-03-25 DIAGNOSIS — L03211 Cellulitis of face: Secondary | ICD-10-CM | POA: Diagnosis present

## 2022-03-25 DIAGNOSIS — E872 Acidosis, unspecified: Secondary | ICD-10-CM | POA: Diagnosis present

## 2022-03-25 DIAGNOSIS — E1122 Type 2 diabetes mellitus with diabetic chronic kidney disease: Secondary | ICD-10-CM

## 2022-03-25 DIAGNOSIS — Z833 Family history of diabetes mellitus: Secondary | ICD-10-CM | POA: Diagnosis not present

## 2022-03-25 DIAGNOSIS — N200 Calculus of kidney: Secondary | ICD-10-CM | POA: Diagnosis not present

## 2022-03-25 DIAGNOSIS — E1165 Type 2 diabetes mellitus with hyperglycemia: Secondary | ICD-10-CM | POA: Diagnosis present

## 2022-03-25 DIAGNOSIS — E876 Hypokalemia: Secondary | ICD-10-CM | POA: Diagnosis present

## 2022-03-25 DIAGNOSIS — K4020 Bilateral inguinal hernia, without obstruction or gangrene, not specified as recurrent: Secondary | ICD-10-CM | POA: Diagnosis not present

## 2022-03-25 DIAGNOSIS — L03116 Cellulitis of left lower limb: Secondary | ICD-10-CM | POA: Diagnosis present

## 2022-03-25 DIAGNOSIS — K449 Diaphragmatic hernia without obstruction or gangrene: Secondary | ICD-10-CM | POA: Diagnosis not present

## 2022-03-25 DIAGNOSIS — E1151 Type 2 diabetes mellitus with diabetic peripheral angiopathy without gangrene: Secondary | ICD-10-CM

## 2022-03-25 DIAGNOSIS — N281 Cyst of kidney, acquired: Secondary | ICD-10-CM | POA: Diagnosis not present

## 2022-03-25 DIAGNOSIS — A419 Sepsis, unspecified organism: Secondary | ICD-10-CM | POA: Diagnosis present

## 2022-03-25 DIAGNOSIS — R6520 Severe sepsis without septic shock: Secondary | ICD-10-CM | POA: Diagnosis present

## 2022-03-25 DIAGNOSIS — E11621 Type 2 diabetes mellitus with foot ulcer: Secondary | ICD-10-CM | POA: Diagnosis not present

## 2022-03-25 DIAGNOSIS — Z803 Family history of malignant neoplasm of breast: Secondary | ICD-10-CM | POA: Diagnosis not present

## 2022-03-25 DIAGNOSIS — M21372 Foot drop, left foot: Secondary | ICD-10-CM | POA: Diagnosis present

## 2022-03-25 DIAGNOSIS — L0390 Cellulitis, unspecified: Secondary | ICD-10-CM | POA: Diagnosis present

## 2022-03-25 DIAGNOSIS — E1142 Type 2 diabetes mellitus with diabetic polyneuropathy: Secondary | ICD-10-CM | POA: Diagnosis present

## 2022-03-25 DIAGNOSIS — R109 Unspecified abdominal pain: Secondary | ICD-10-CM | POA: Diagnosis not present

## 2022-03-25 DIAGNOSIS — E0842 Diabetes mellitus due to underlying condition with diabetic polyneuropathy: Secondary | ICD-10-CM

## 2022-03-25 DIAGNOSIS — N182 Chronic kidney disease, stage 2 (mild): Secondary | ICD-10-CM | POA: Diagnosis present

## 2022-03-25 DIAGNOSIS — Z87440 Personal history of urinary (tract) infections: Secondary | ICD-10-CM | POA: Diagnosis not present

## 2022-03-25 DIAGNOSIS — Z96641 Presence of right artificial hip joint: Secondary | ICD-10-CM | POA: Diagnosis present

## 2022-03-25 DIAGNOSIS — E113511 Type 2 diabetes mellitus with proliferative diabetic retinopathy with macular edema, right eye: Secondary | ICD-10-CM | POA: Diagnosis not present

## 2022-03-25 DIAGNOSIS — E11311 Type 2 diabetes mellitus with unspecified diabetic retinopathy with macular edema: Secondary | ICD-10-CM | POA: Diagnosis present

## 2022-03-25 DIAGNOSIS — E782 Mixed hyperlipidemia: Secondary | ICD-10-CM | POA: Diagnosis present

## 2022-03-25 DIAGNOSIS — N179 Acute kidney failure, unspecified: Secondary | ICD-10-CM | POA: Diagnosis present

## 2022-03-25 DIAGNOSIS — M7989 Other specified soft tissue disorders: Secondary | ICD-10-CM | POA: Diagnosis not present

## 2022-03-25 DIAGNOSIS — K760 Fatty (change of) liver, not elsewhere classified: Secondary | ICD-10-CM | POA: Diagnosis present

## 2022-03-25 DIAGNOSIS — G4733 Obstructive sleep apnea (adult) (pediatric): Secondary | ICD-10-CM | POA: Diagnosis present

## 2022-03-25 DIAGNOSIS — K8020 Calculus of gallbladder without cholecystitis without obstruction: Secondary | ICD-10-CM | POA: Diagnosis not present

## 2022-03-25 DIAGNOSIS — R600 Localized edema: Secondary | ICD-10-CM | POA: Diagnosis not present

## 2022-03-25 DIAGNOSIS — M21371 Foot drop, right foot: Secondary | ICD-10-CM | POA: Diagnosis present

## 2022-03-25 LAB — GLUCOSE, CAPILLARY
Glucose-Capillary: 111 mg/dL — ABNORMAL HIGH (ref 70–99)
Glucose-Capillary: 141 mg/dL — ABNORMAL HIGH (ref 70–99)
Glucose-Capillary: 165 mg/dL — ABNORMAL HIGH (ref 70–99)
Glucose-Capillary: 259 mg/dL — ABNORMAL HIGH (ref 70–99)
Glucose-Capillary: 262 mg/dL — ABNORMAL HIGH (ref 70–99)

## 2022-03-25 LAB — CBC WITH DIFFERENTIAL/PLATELET
Abs Immature Granulocytes: 0.04 10*3/uL (ref 0.00–0.07)
Basophils Absolute: 0.1 10*3/uL (ref 0.0–0.1)
Basophils Relative: 1 %
Eosinophils Absolute: 0.7 10*3/uL — ABNORMAL HIGH (ref 0.0–0.5)
Eosinophils Relative: 8 %
HCT: 35.5 % — ABNORMAL LOW (ref 36.0–46.0)
Hemoglobin: 11.9 g/dL — ABNORMAL LOW (ref 12.0–15.0)
Immature Granulocytes: 1 %
Lymphocytes Relative: 10 %
Lymphs Abs: 0.8 10*3/uL (ref 0.7–4.0)
MCH: 30.7 pg (ref 26.0–34.0)
MCHC: 33.5 g/dL (ref 30.0–36.0)
MCV: 91.5 fL (ref 80.0–100.0)
Monocytes Absolute: 0.5 10*3/uL (ref 0.1–1.0)
Monocytes Relative: 6 %
Neutro Abs: 6 10*3/uL (ref 1.7–7.7)
Neutrophils Relative %: 74 %
Platelets: 280 10*3/uL (ref 150–400)
RBC: 3.88 MIL/uL (ref 3.87–5.11)
RDW: 13.3 % (ref 11.5–15.5)
WBC: 8 10*3/uL (ref 4.0–10.5)
nRBC: 0 % (ref 0.0–0.2)

## 2022-03-25 LAB — LACTIC ACID, PLASMA
Lactic Acid, Venous: 1.2 mmol/L (ref 0.5–1.9)
Lactic Acid, Venous: 1.8 mmol/L (ref 0.5–1.9)
Lactic Acid, Venous: 2.2 mmol/L (ref 0.5–1.9)
Lactic Acid, Venous: 2.9 mmol/L (ref 0.5–1.9)
Lactic Acid, Venous: 3.1 mmol/L (ref 0.5–1.9)

## 2022-03-25 LAB — COMPREHENSIVE METABOLIC PANEL
ALT: 40 U/L (ref 0–44)
AST: 37 U/L (ref 15–41)
Albumin: 2.6 g/dL — ABNORMAL LOW (ref 3.5–5.0)
Alkaline Phosphatase: 48 U/L (ref 38–126)
Anion gap: 12 (ref 5–15)
BUN: 16 mg/dL (ref 8–23)
CO2: 26 mmol/L (ref 22–32)
Calcium: 8.4 mg/dL — ABNORMAL LOW (ref 8.9–10.3)
Chloride: 97 mmol/L — ABNORMAL LOW (ref 98–111)
Creatinine, Ser: 1.08 mg/dL — ABNORMAL HIGH (ref 0.44–1.00)
GFR, Estimated: 55 mL/min — ABNORMAL LOW (ref >60)
Glucose, Bld: 249 mg/dL — ABNORMAL HIGH (ref 70–99)
Potassium: 3.9 mmol/L (ref 3.5–5.1)
Sodium: 135 mmol/L (ref 135–145)
Total Bilirubin: 0.5 mg/dL (ref 0.3–1.2)
Total Protein: 5.3 g/dL — ABNORMAL LOW (ref 6.5–8.1)

## 2022-03-25 LAB — CK: Total CK: 110 U/L (ref 38–234)

## 2022-03-25 LAB — TROPONIN I (HIGH SENSITIVITY): Troponin I (High Sensitivity): 36 ng/L — ABNORMAL HIGH (ref <18)

## 2022-03-25 LAB — HEMOGLOBIN A1C
Hgb A1c MFr Bld: 7.8 % — ABNORMAL HIGH (ref 4.8–5.6)
Mean Plasma Glucose: 177.16 mg/dL

## 2022-03-25 LAB — TSH: TSH: 1.761 u[IU]/mL (ref 0.350–4.500)

## 2022-03-25 MED ORDER — HEPARIN SODIUM (PORCINE) 5000 UNIT/ML IJ SOLN
5000.0000 [IU] | Freq: Three times a day (TID) | INTRAMUSCULAR | Status: DC
  Administered 2022-03-25 – 2022-03-27 (×6): 5000 [IU] via SUBCUTANEOUS
  Filled 2022-03-25 (×6): qty 1

## 2022-03-25 MED ORDER — INSULIN ASPART 100 UNIT/ML IJ SOLN
0.0000 [IU] | Freq: Three times a day (TID) | INTRAMUSCULAR | Status: DC
  Administered 2022-03-25: 2 [IU] via SUBCUTANEOUS
  Administered 2022-03-25 – 2022-03-27 (×4): 5 [IU] via SUBCUTANEOUS
  Administered 2022-03-27: 1 [IU] via SUBCUTANEOUS

## 2022-03-25 MED ORDER — VITAMIN D 25 MCG (1000 UNIT) PO TABS
5000.0000 [IU] | ORAL_TABLET | Freq: Every day | ORAL | Status: DC
  Administered 2022-03-25 – 2022-03-27 (×3): 5000 [IU] via ORAL
  Filled 2022-03-25 (×3): qty 5

## 2022-03-25 MED ORDER — VANCOMYCIN HCL IN DEXTROSE 1-5 GM/200ML-% IV SOLN
1000.0000 mg | INTRAVENOUS | Status: DC
  Administered 2022-03-26 – 2022-03-27 (×2): 1000 mg via INTRAVENOUS
  Filled 2022-03-25 (×2): qty 200

## 2022-03-25 MED ORDER — MAGNESIUM OXIDE -MG SUPPLEMENT 400 (240 MG) MG PO TABS
400.0000 mg | ORAL_TABLET | Freq: Every day | ORAL | Status: DC
  Administered 2022-03-25 – 2022-03-27 (×3): 400 mg via ORAL
  Filled 2022-03-25 (×3): qty 1

## 2022-03-25 MED ORDER — DULOXETINE HCL 30 MG PO CPEP
30.0000 mg | ORAL_CAPSULE | Freq: Every day | ORAL | Status: DC
  Administered 2022-03-25 – 2022-03-27 (×3): 30 mg via ORAL
  Filled 2022-03-25 (×3): qty 1

## 2022-03-25 MED ORDER — SODIUM CHLORIDE 0.9 % IV SOLN: INTRAVENOUS | Status: AC

## 2022-03-25 MED ORDER — ACETAMINOPHEN 650 MG RE SUPP: 650.0000 mg | Freq: Four times a day (QID) | RECTAL | Status: DC | PRN

## 2022-03-25 MED ORDER — VANCOMYCIN HCL 1500 MG/300ML IV SOLN
1500.0000 mg | Freq: Once | INTRAVENOUS | Status: AC
  Administered 2022-03-25: 1500 mg via INTRAVENOUS
  Filled 2022-03-25 (×2): qty 300

## 2022-03-25 MED ORDER — INSULIN GLARGINE-YFGN 100 UNIT/ML ~~LOC~~ SOLN
12.0000 [IU] | Freq: Every day | SUBCUTANEOUS | Status: DC
  Administered 2022-03-25 – 2022-03-26 (×2): 12 [IU] via SUBCUTANEOUS
  Filled 2022-03-25 (×3): qty 0.12

## 2022-03-25 MED ORDER — ACETAMINOPHEN 325 MG PO TABS: 650.0000 mg | ORAL_TABLET | Freq: Four times a day (QID) | ORAL | Status: DC | PRN

## 2022-03-25 MED ORDER — SODIUM CHLORIDE 0.9 % IV SOLN
2.0000 g | INTRAVENOUS | Status: DC
  Administered 2022-03-25 – 2022-03-27 (×3): 2 g via INTRAVENOUS
  Filled 2022-03-25 (×3): qty 20

## 2022-03-25 MED ORDER — SODIUM CHLORIDE 0.9 % IV BOLUS
500.0000 mL | Freq: Once | INTRAVENOUS | Status: AC
  Administered 2022-03-25: 500 mL via INTRAVENOUS

## 2022-03-25 MED ORDER — METRONIDAZOLE 500 MG/100ML IV SOLN
500.0000 mg | Freq: Two times a day (BID) | INTRAVENOUS | Status: DC
  Administered 2022-03-25 – 2022-03-27 (×5): 500 mg via INTRAVENOUS
  Filled 2022-03-25 (×5): qty 100

## 2022-03-25 NOTE — Progress Notes (Signed)
Pt refused CPAP for tonight. Pt stated that she has not worn CPAP for months at home and does not want to wear it in the hospital.

## 2022-03-25 NOTE — Progress Notes (Addendum)
    SHORT PROGRESS NOTE   This is a 71 year old female with type 2 diabetes mellitus, sleep apnea, hypertension, bilateral foot drop due to neuropathy who was admitted by my colleague this morning.  She presented for nausea and weakness.  Pertinent history includes noticing a blister on the left foot followed by redness.  She was seen by wound care on 03/19/2022. Her husband who is at bedside states that she is progressively weaker over the past week.  He states that she was placed on 2 different antibiotics for a UTI last week..  In the ED: Found to have a temperature of 102, lactic acid 2.9, creatinine 1.38 and erythema of the left foot and shin. Imaging of abdomen showed mild air-fluid levels, possible gastroenteritis  Started on ceftriaxone and metronidazole to treat cellulitis.  Also started on IV fluids.  Subjective: The patient does not have any complaints.  She is somnolent.  She awakens and we discussed the fact that she has swelling on the left side of her face and redness.  She does not feel any pain there.  She states that her teeth do not hurt and she is not aware of any cavities.  No pain in her eyes or her ears.   Today's Vitals   03/25/22 0100 03/25/22 0109 03/25/22 0514 03/25/22 0515  BP: (!) 146/79   (!) 152/77  Pulse: 85   83  Resp: 20   (!) 23  Temp:  99.5 F (37.5 C) 98.2 F (36.8 C)   TempSrc:  Oral    SpO2: 98%   97%  PainSc:       There is no height or weight on file to calculate BMI.    Assessment and Plan: Principal Problem: Severe sepsis -respiratory rate in the 20s, elevated temperature in setting of Left foot and leg cellulitis and left facial cellulitis Lactic acidosis -According to the med rec, the patient was prescribed Macrobid on 03/18/2022 and was supposed to take a 7-day course - Currently she appears to have a left foot and leg cellulitis along with a left facial cellulitis-there is no eye pain or dental pain - Lactic acid improved to 1.2 and has  risen to 2.2-ordered 500 cc of normal saline and then we will recheck the lactic acid - No recurrence of fever as of yet  Active Problems:   AKI-stage 2 chronic kidney disease  -Baseline creatinine is about 0.9 - She presented with a creatinine of 1.38 which is improved to 1.08 - cont NS at 75 cc/hr  Mildly elevated LFTs -AST 48, ALT 45 - Likely secondary to sepsis - Have improved today to normal    Diabetes mellitus type 2 with peripheral artery disease (HCC)   Diabetic macular edema of right eye with proliferative retinopathy associated with type 2 diabetes mellitus ([place])   Diabetic polyneuropathy ([place]) -She takes metformin and glimepiride at home - A1c 7.8 -Continue sliding scale insulin with meals- ad Lantus 12 U QHS while in hospital  OSA States she has sleep apnea but is not able to tolerate a CPAP   Debbe Odea, MD Pager: [email or web address]     Debbe Odea, MD 03/25/2022 [facility]

## 2022-03-25 NOTE — Progress Notes (Signed)
Inpatient Diabetes Program Recommendations  AACE/ADA: New Consensus Statement on Inpatient Glycemic Control (2015)  Target Ranges:  Prepandial:   less than 140 mg/dL      Peak postprandial:   less than 180 mg/dL (1-2 hours)      Critically ill patients:  140 - 180 mg/dL   Lab Results  Component Value Date   GLUCAP 262 (H) 03/25/2022   HGBA1C 7.8 (H) 03/25/2022    Review of Glycemic Control  Latest Reference Range & Units 03/24/22 19:08 03/25/22 09:16 03/25/22 12:05  Glucose-Capillary 70 - 99 mg/dL 331 (H) 259 (H) 262 (H)   Diabetes history: DM 2 Outpatient Diabetes medications:  Metformin 1000 mg bid, Amaryl 2 mg daily Current orders for Inpatient glycemic control:  Novolog 0-9 units tid with meals  Inpatient Diabetes Program Recommendations:    While in the hospital, consider adding Semglee 14 units daily.   Thanks,  Adah Perl, RN, BC-ADM Inpatient Diabetes Coordinator Pager [phone]  (8a-5p)

## 2022-03-25 NOTE — Progress Notes (Signed)
Pharmacy Antibiotic Note  Grace [name] is a 71 y.o. female admitted on 03/24/2022 with sepsis.  Pt was recently diagnosed with an E. coli UTI 1 week PTA and was prescribed nitrofurantoin on 8/1 for 7 days and doxycycline on 8/2 for 7 days. Pt reports taking nitrofurantoin prior to admission and continued to clinically deteriorate with worsening weakness, chills, nausea, and a couple of episodes of vomiting prior to coming to ED on 8/7. Pharmacy has been consulted for Vancomycin dosing.  WBC 8 - trending down, afebrile Scr 1.08 - trending down  Plan: Vancomycin '1500mg'$  IV x1, followed by  Vancomycin '1000mg'$  IV q24h (eAUC~ 490)    > Goal AUC 400-550    > Check vancomycin levels at steady state Continue Ceftriaxone 2g IV q24h per MD Continue Metronidazole '500mg'$  IV q12h per MD Monitor daily WBC, temp, Scr, and clinical s/sx of infection F/u cultures and de-escalate antibiotics as able    Temp (24hrs), Avg:99.3 F (37.4 C), Min:98.2 F (36.8 C), Max:102.5 F (39.2 C)  Recent Labs  Lab 03/18/22 1009 03/24/22 1307 03/24/22 2005 03/24/22 2143 03/25/22 0000 03/25/22 0609 03/25/22 0645  WBC 8.7 10.0  --   --   --  8.0  --   CREATININE 1.07*  --   --  1.38*  --  1.08*  --   LATICACIDVEN  --   --  2.9*  --  3.1*  --  1.2    Estimated Creatinine Clearance: 46.9 mL/min (A) (by C-G formula based on SCr of 1.08 mg/dL (H)).    Allergies  Allergen Reactions   Gabapentin Swelling    Swelling in legs and feet    Antimicrobials this admission: (Nitrofurantoin 8/1 >> 8/7 - outpatient) Ceftriaxone 8/8 >> (8/14) Metronidazole 8/8 >> (8/14) Vancomycin 8/8 >>   Dose adjustments this admission: N/A  Microbiology results: 8/7 BCx: collected 8/7 UCx: collected   Thank you for allowing pharmacy to be a part of this patient's care.  Kaleen Mask 03/25/2022 8:46 AM

## 2022-03-25 NOTE — ED Provider Notes (Signed)
Patient care assumed at 2330.  Patient with history of diabetes was recently treated for UTI on Macrobid and also started on doxycycline for diabetic foot wound on August 2.  Care assumed pending repeat lactic acid. Repeat lactic acid is increased when compared to prior despite patient receiving IV fluid hydration.  Patient's fever does appear to be returning with temperature of 99.5.  Recommend at this point in time that she stay in the hospital for ongoing care for fever, possibly due to diabetic foot infection.  Will start on antibiotics and provide additional fluids.  Medicine consulted for admission for ongoing care.      Quintella Reichert, MD 03/25/22 [phone]

## 2022-03-25 NOTE — ED Notes (Signed)
Patient transported to MRI 

## 2022-03-25 NOTE — ED Notes (Signed)
Pt tolerated crackers and water.

## 2022-03-25 NOTE — H&P (Addendum)
History and Physical    Grace [name]:[id number] DOB: 01-18-1951 DOA: 03/24/2022  PCP: Unk Pinto, MD  Patient coming from: Assisted living facility.  Chief Complaint: Feeling weak and nauseous.  HPI: Grace [name] is a 71 y.o. female with history of diabetes mellitus type 2, bilateral foot drop secondary to diabetic neuropathy was recently treated for urinary tract infection cultures grew E. coli presents to the ER patient was feeling increasingly weak nauseous unable to eat anything.  Denies any abdominal pain.  Denies chest pain or shortness of breath.  Recently patient has noticed a blister on the left foot and had gone to wound team.  There is some erythema of the left foot and also the left shin.  ED Course: In the ER patient was having a temperature of 102 F.  Lactic acid was elevated.  Patient was given fluid bolus and admitted for possible sepsis.  Source could be left foot cellulitis.  X-rays of the left foot does not show any bone involvement.  Chest x-ray COVID test and UA unremarkable.  Review of Systems: As per HPI, rest all negative.   Past Medical History:  Diagnosis Date   Abnormality of gait 07/28/2013   Chronic kidney disease    Diabetes (HCC)    Diabetic peripheral neuropathy (HCC)    Diabetic retinopathy ([place])    Difficult intubation    Foot drop, bilateral 07/31/2014   GERD (gastroesophageal reflux disease)    Peripheral edema    Polyneuropathy in diabetes(357.2) 07/28/2013   Sleep apnea    uses cpap   Tongue mass     Past Surgical History:  Procedure Laterality Date   ABDOMINAL HYSTERECTOMY     ANKLE FRACTURE SURGERY     left   CATARACT EXTRACTION W/PHACO Right 02/21/2014   Dr. Herbert Deaner   CATARACT EXTRACTION W/PHACO Left 04/10/2014   Dr. Herbert Deaner   CATARACT EXTRACTION, BILATERAL     DIRECT LARYNGOSCOPY Left 08/02/2021   Procedure: DIRECT LARYNGOSCOPY WITH TONGUE BIOPSY;  Surgeon: Leta Baptist, MD;  Location: [facility];   Service: ENT;  Laterality: Left;   HIP SURGERY     right   MASS BIOPSY Left 09/06/2021   Procedure: NECK MASS BIOPSY;  Surgeon: Leta Baptist, MD;  Location: [facility];  Service: ENT;  Laterality: Left;   TONSILLECTOMY       reports that she has never smoked. She has never used smokeless tobacco. She reports that she does not drink alcohol and does not use drugs.  Allergies  Allergen Reactions   Gabapentin Swelling    Swelling in legs and feet    Family History  Problem Relation Age of Onset   Diabetes Mother    Breast cancer Maternal Aunt    Diabetes Brother    Neuropathy Neg Hx     Prior to Admission medications   Medication Sig Start Date End Date Taking? Authorizing Provider  acetaminophen (TYLENOL) 500 MG tablet Take 1,500 mg by mouth every 8 (eight) hours as needed for moderate pain.    [provider]  blood glucose meter kit and supplies KIT Dispense based on patient and insurance preference. Use up to four times daily as directed. 03/18/22   Alycia Rossetti, NP  Cholecalciferol (DIALYVITE VITAMIN D 5000) 125 MCG (5000 UT) capsule Take 5,000 Units by mouth daily.    [provider]  DULoxetine (CYMBALTA) 30 MG capsule Take 1 capsule (30 mg total) by mouth daily. 01/22/22   Olegario Messier,  Charlynne Cousins, NP  glimepiride (AMARYL) 2 MG tablet Take  1 tablet  2 x /day  with Meals  for Diabetes Patient taking differently: Take 2 mg by mouth daily. 02/03/21   Unk Pinto, MD  glucose blood (ACCU-CHEK GUIDE) test strip Check blood sugar 3 times a day-DX-E11.69 AND E78.5. 11/10/19   Unk Pinto, MD  Magnesium 250 MG TABS Take 500 mg by mouth daily.    [provider]  metFORMIN (GLUCOPHAGE-XR) 500 MG 24 hr tablet TAKE 2 TABLETS BY MOUTH TWICE DAILY FOR DIABETES 03/21/22   Alycia Rossetti, NP  nitrofurantoin, macrocrystal-monohydrate, (MACROBID) 100 MG capsule Take 1 capsule (100 mg total) by mouth 2 (two) times daily for 7 days. 03/18/22 03/25/22  Alycia Rossetti, NP  Omega-3 350 MG CPDR Take 350 mg by mouth 2 (two) times daily.    [provider]  zinc gluconate 50 MG tablet Take 50 mg by mouth daily.    [provider]    Physical Exam: Constitutional: Moderately built and nourished. Vitals:   03/25/22 0100 03/25/22 0109 03/25/22 0514 03/25/22 0515  BP: (!) 146/79   (!) 152/77  Pulse: 85   83  Resp: 20   (!) 23  Temp:  99.5 F (37.5 C) 98.2 F (36.8 C)   TempSrc:  Oral    SpO2: 98%   97%   Eyes: Anicteric no pallor. ENMT: No discharge from the ears eyes nose and mouth. Neck: No mass felt.  No neck rigidity. Respiratory: No rhonchi or crepitations. Cardiovascular: S1-S2 heard. Abdomen: Soft nontender bowel sound present. Musculoskeletal: Left foot has a blister on the medial aspect with some erythema.  No active discharge. Skin: Left foot has a blister with some erythema. Neurologic: Alert awake oriented time place and person moving all extremities. Psychiatric: Appears normal.  Normal affect.   Labs on Admission: I have personally reviewed following labs and imaging studies  CBC: Recent Labs  Lab 03/18/22 1009 03/24/22 1307  WBC 8.7 10.0  NEUTROABS 6,177  --   HGB 12.3 13.3  HCT 37.6 40.1  MCV 94.0 92.8  PLT 405* 379   Basic Metabolic Panel: Recent Labs  Lab 03/18/22 1009 03/24/22 2143  NA 138 131*  K 5.7* 4.5  CL 99 93*  CO2 30 28  GLUCOSE 198* 324*  BUN 22 20  CREATININE 1.07* 1.38*  CALCIUM 9.8 9.0   GFR: Estimated Creatinine Clearance: 36.7 mL/min (A) (by C-G formula based on SCr of 1.38 mg/dL (H)). Liver Function Tests: Recent Labs  Lab 03/18/22 1009 03/24/22 2143  AST 14 48*  ALT 14 45*  ALKPHOS  --  54  BILITOT 0.3 0.2*  PROT 6.2 6.1*  ALBUMIN  --  3.0*   No results for input(s): "LIPASE", "AMYLASE" in the last 168 hours. No results for input(s): "AMMONIA" in the last 168 hours. Coagulation Profile: No results for input(s): "INR", "PROTIME" in the last 168  hours. Cardiac Enzymes: No results for input(s): "CKTOTAL", "CKMB", "CKMBINDEX", "TROPONINI" in the last 168 hours. BNP (last 3 results) No results for input(s): "PROBNP" in the last 8760 hours. HbA1C: No results for input(s): "HGBA1C" in the last 72 hours. CBG: Recent Labs  Lab 03/24/22 1908  GLUCAP 331*   Lipid Profile: No results for input(s): "CHOL", "HDL", "LDLCALC", "TRIG", "CHOLHDL", "LDLDIRECT" in the last 72 hours. Thyroid Function Tests: No results for input(s): "TSH", "T4TOTAL", "FREET4", "T3FREE", "THYROIDAB" in the last 72 hours. Anemia Panel: No results for input(s): "VITAMINB12", "FOLATE", "  FERRITIN", "TIBC", "IRON", "RETICCTPCT" in the last 72 hours. Urine analysis:    Component Value Date/Time   COLORURINE YELLOW 03/24/2022 2107   APPEARANCEUR HAZY (A) 03/24/2022 2107   LABSPEC 1.031 (H) 03/24/2022 2107   PHURINE 5.0 03/24/2022 2107   GLUCOSEU >=500 (A) 03/24/2022 2107   HGBUR NEGATIVE 03/24/2022 2107   BILIRUBINUR NEGATIVE 03/24/2022 2107   KETONESUR 20 (A) 03/24/2022 2107   PROTEINUR 100 (A) 03/24/2022 2107   NITRITE NEGATIVE 03/24/2022 2107   LEUKOCYTESUR NEGATIVE 03/24/2022 2107   Sepsis Labs: $RemoveBefo'@[email or web address]'HpldrLUuAhU$ (procalcitonin:4,lacticidven:4) ) Recent Results (from the past 240 hour(s))  Urine Culture     Status: Abnormal   Collection Time: 03/18/22 10:09 AM   Specimen: Urine  Result Value Ref Range Status   MICRO NUMBER: [id number]  Final   SPECIMEN QUALITY: Adequate  Final   Sample Source URINE  Final   STATUS: FINAL  Final   ISOLATE 1: Escherichia coli (A)  Final    Comment: Greater than 100,000 CFU/mL of Escherichia coli      Susceptibility   Escherichia coli - URINE CULTURE, REFLEX    AMOX/CLAVULANIC >=32 Resistant     AMPICILLIN >=32 Resistant     AMPICILLIN/SULBACTAM >=32 Resistant     CEFAZOLIN* 8 Resistant      * For uncomplicated UTI caused by E. coli, K. pneumoniae or P. mirabilis: Cefazolin is susceptible if MIC <32 mcg/mL and  predicts susceptible to the oral agents cefaclor, cefdinir, cefpodoxime, cefprozil, cefuroxime, cephalexin and loracarbef.     CEFTAZIDIME <=1 Sensitive     CEFEPIME <=1 Sensitive     CEFTRIAXONE <=1 Sensitive     CIPROFLOXACIN <=0.25 Sensitive     LEVOFLOXACIN <=0.12 Sensitive     GENTAMICIN <=1 Sensitive     IMIPENEM <=0.25 Sensitive     NITROFURANTOIN <=16 Sensitive     PIP/TAZO <=4 Sensitive     TOBRAMYCIN <=1 Sensitive     TRIMETH/SULFA* <=20 Sensitive      * For uncomplicated UTI caused by E. coli, K. pneumoniae or P. mirabilis: Cefazolin is susceptible if MIC <32 mcg/mL and predicts susceptible to the oral agents cefaclor, cefdinir, cefpodoxime, cefprozil, cefuroxime, cephalexin and loracarbef. Legend: S = Susceptible  I = Intermediate R = Resistant  NS = Not susceptible * = Not tested  NR = Not reported **NN = See antimicrobic comments   MICROSCOPIC MESSAGE     Status: None   Collection Time: 03/18/22 10:09 AM  Result Value Ref Range Status   Note   Final    Comment: This urine was analyzed for the presence of WBC,  RBC, bacteria, casts, and other formed elements.  Only those elements seen were reported. . .   Resp Panel by RT-PCR (Flu A&B, Covid) Peripheral     Status: None   Collection Time: 03/24/22  7:07 PM   Specimen: Peripheral; Nasal Swab  Result Value Ref Range Status   SARS Coronavirus 2 by RT PCR NEGATIVE NEGATIVE Final    Comment: (NOTE) SARS-CoV-2 target nucleic acids are NOT DETECTED.  The SARS-CoV-2 RNA is generally detectable in upper respiratory specimens during the acute phase of infection. The lowest concentration of SARS-CoV-2 viral copies this assay can detect is 138 copies/mL. A negative result does not preclude SARS-Cov-2 infection and should not be used as the sole basis for treatment or other patient management decisions. A negative result may occur with  improper specimen collection/handling, submission of specimen other than  nasopharyngeal swab, presence of viral mutation(s) within the  areas targeted by this assay, and inadequate number of viral copies(<138 copies/mL). A negative result must be combined with clinical observations, patient history, and epidemiological information. The expected result is Negative.  Fact Sheet for Patients:  EntrepreneurPulse.com.au  Fact Sheet for Healthcare Providers:  IncredibleEmployment.be  This test is no t yet approved or cleared by the [place] FDA and  has been authorized for detection and/or diagnosis of SARS-CoV-2 by FDA under an Emergency Use Authorization (EUA). This EUA will remain  in effect (meaning this test can be used) for the duration of the COVID-19 declaration under Section 564(b)(1) of the Act, 21 U.S.C.section 360bbb-3(b)(1), unless the authorization is terminated  or revoked sooner.       Influenza A by PCR NEGATIVE NEGATIVE Final   Influenza B by PCR NEGATIVE NEGATIVE Final    Comment: (NOTE) The Xpert Xpress SARS-CoV-2/FLU/RSV plus assay is intended as an aid in the diagnosis of influenza from Nasopharyngeal swab specimens and should not be used as a sole basis for treatment. Nasal washings and aspirates are unacceptable for Xpert Xpress SARS-CoV-2/FLU/RSV testing.  Fact Sheet for Patients: EntrepreneurPulse.com.au  Fact Sheet for Healthcare Providers: IncredibleEmployment.be  This test is not yet approved or cleared by the [place] FDA and has been authorized for detection and/or diagnosis of SARS-CoV-2 by FDA under an Emergency Use Authorization (EUA). This EUA will remain in effect (meaning this test can be used) for the duration of the COVID-19 declaration under Section 564(b)(1) of the Act, 21 U.S.C. section 360bbb-3(b)(1), unless the authorization is terminated or revoked.  Performed at [facility], [place] [address], [place], [place] [address]       Radiological Exams on Admission: CT RENAL STONE STUDY  Result Date: 03/25/2022 CLINICAL DATA:  Flank pain.  Urinary tract infection. EXAM: CT ABDOMEN AND PELVIS WITHOUT CONTRAST TECHNIQUE: Multidetector CT imaging of the abdomen and pelvis was performed following the standard protocol without IV contrast. RADIATION DOSE REDUCTION: This exam was performed according to the departmental dose-optimization program which includes automated exposure control, adjustment of the mA and/or kV according to patient size and/or use of iterative reconstruction technique. COMPARISON:  Abdomen pelvis CT 03/24/2022 FINDINGS: Lower chest: Unremarkable. Hepatobiliary: No suspicious focal abnormality in the liver on this study without intravenous contrast. Tumefactive sludge versus noncalcified gallstone (image 26/series 3). No intrahepatic or extrahepatic biliary dilation. Pancreas: No focal mass lesion. No dilatation of the main duct. No intraparenchymal cyst. No peripancreatic edema. Spleen: No splenomegaly. No focal mass lesion. Adrenals/Urinary Tract: No adrenal nodule or mass. Small simple right renal cyst again noted. No followup recommended. Both kidneys are excreting contrast from yesterday's CT scan which would obscure collecting system stones although no stones are visible on yesterday's study. No evidence for hydroureter. The urinary bladder appears normal for the degree of distention. Stomach/Bowel: Tiny hiatal hernia. Stomach otherwise unremarkable. Duodenum is normally positioned as is the ligament of Treitz. No small bowel wall thickening. No small bowel dilatation. The terminal ileum is normal. The appendix is normal. No gross colonic mass. No colonic wall thickening. Vascular/Lymphatic: There is moderate atherosclerotic calcification of the abdominal aorta without aneurysm. There is no gastrohepatic or hepatoduodenal ligament lymphadenopathy. No retroperitoneal or mesenteric lymphadenopathy. No pelvic sidewall  lymphadenopathy. Reproductive: The uterus is surgically absent. There is no adnexal mass. Other: No intraperitoneal free fluid. Musculoskeletal: Small bilateral groin hernias contain only fat. Status post right hip replacement. No worrisome lytic or sclerotic osseous abnormality. IMPRESSION: 1. No acute findings in the  abdomen or pelvis. Specifically, no findings to explain the patient's history of flank pain. 2. Tumefactive sludge versus noncalcified gallstone. 3. Small bilateral groin hernias contain only fat. 4. Aortic Atherosclerosis (ICD10-I70.0). Electronically Signed   By: Misty Stanley M.D.   On: 03/25/2022 05:12   DG Foot 2 Views Left  Result Date: 03/25/2022 CLINICAL DATA:  Left foot wound EXAM: LEFT FOOT - 2 VIEW COMPARISON:  None Available. FINDINGS: Osseous structures are mildly osteopenic. No acute fracture or dislocation. No erosions or abnormal periosteal reaction. Mild soft tissue swelling of the left forefoot. Small superior and plantar calcaneal spurs are present. Bimalleolar ORIF has been performed, not well assessed on this exam. IMPRESSION: Mild soft tissue swelling. No radiographic evidence of osteomyelitis. Electronically Signed   By: Fidela Salisbury M.D.   On: 03/25/2022 02:36   CT ABDOMEN PELVIS W CONTRAST  Result Date: 03/24/2022 CLINICAL DATA:  Right lower quadrant nausea and vomiting, abdominal pain EXAM: CT ABDOMEN AND PELVIS WITH CONTRAST TECHNIQUE: Multidetector CT imaging of the abdomen and pelvis was performed using the standard protocol following bolus administration of intravenous contrast. RADIATION DOSE REDUCTION: This exam was performed according to the departmental dose-optimization program which includes automated exposure control, adjustment of the mA and/or kV according to patient size and/or use of iterative reconstruction technique. CONTRAST:  29mL OMNIPAQUE IOHEXOL 300 MG/ML  SOLN COMPARISON:  None Available. FINDINGS: Lower chest: No acute pleural or parenchymal  lung disease. Hepatobiliary: No focal liver abnormality is seen. No gallstones, gallbladder wall thickening, or biliary dilatation. Pancreas: Unremarkable. No pancreatic ductal dilatation or surrounding inflammatory changes. Spleen: Normal in size without focal abnormality. Adrenals/Urinary Tract: Kidneys are grossly unremarkable with no evidence of focal solid lesion. Normal parenchymal enhancement. The adrenals and bladder are unremarkable. Stomach/Bowel: There are minimally distended loops of small bowel with scattered gas fluid levels, which may reflect an element of ileus or gastroenteritis. Moderate gas and stool throughout the colon. No evidence of high-grade obstruction. Normal appendix right lower quadrant. No bowel wall thickening or inflammatory change. Small hiatal hernia. Vascular/Lymphatic: Aortic atherosclerosis. No enlarged abdominal or pelvic lymph nodes. Reproductive: Status post hysterectomy. No adnexal masses. Other: No free fluid or free intraperitoneal gas. Bilateral fat containing inguinal hernias. Musculoskeletal: Right hip arthroplasty. No acute or destructive bony lesions. Reconstructed images demonstrate no additional findings. IMPRESSION: 1. Mild small bowel distension with scattered gas fluid levels, which could reflect gastroenteritis and/or mild ileus. No evidence of high-grade bowel obstruction. 2. Small hiatal hernia. 3. Normal appendix. 4.  Aortic Atherosclerosis (ICD10-I70.0). Electronically Signed   By: Randa Ngo M.D.   On: 03/24/2022 22:52   DG Chest 2 View  Result Date: 03/24/2022 CLINICAL DATA:  Weakness.  Fever. EXAM: CHEST - 2 VIEW COMPARISON:  02/11/2011 FINDINGS: The heart size and mediastinal contours are within normal limits. Both lungs are clear. The visualized skeletal structures are unremarkable. IMPRESSION: No active cardiopulmonary disease. Electronically Signed   By: Nelson Chimes M.D.   On: 03/24/2022 19:39    EKG: Independently reviewed.  Normal sinus  rhythm.  Assessment/Plan Principal Problem:   Sepsis ([place]) Active Problems:   Diabetic polyneuropathy ([place])   T2_NIDDM   Essential hypertension   OSA on CPAP   Cellulitis    Possible developing sepsis source could be left foot cellulitis for which patient is on empiric antibiotics.  Will check MRI of the left foot.  Wound team consult.  Continue IV fluids follow lactic acid.  Follow cultures. Nausea and generalized weakness could  be deconditioning.  Could also be from possible sepsis.  Gently hydrate check CK levels TSH.  Physical therapy consult.Check [place] RUQ CT showing gallstones. Bilateral foot drop secondary to neuropathy. Diabetes mellitus type 2 with hyperglycemia we will check hemoglobin A1c.  On sliding scale coverage.  May need long-acting insulin. Sleep apnea on CPAP at bedtime. History of hypertension mention in the chart presently not on any antihypertensives.  Follow blood pressure trends.  Home medications need to be verified.   Since patient has possible sepsis with elevated lactic acid fever possible source of left leg cellulitis and blisters will need close monitoring and inpatient status.   DVT prophylaxis: Heparin. Code Status: Full code. Family Communication: I tried to reach patient husband was unable to. Disposition Plan: Back to facility when stable. Consults called: Wound team. Admission status: Inpatient.   Rise Patience MD [facility] Pager [phone].  If 7PM-7AM, please contact night-coverage [email or web address] Password [email or web address]  03/25/2022, 6:07 AM

## 2022-03-25 NOTE — Consult Note (Signed)
[facility] Nurse Consult Note: Patient receiving care in [facility] [phone]. Spouse and primary RN at bedside. Reason for Consult: left foot wound Wound type: there is a spontaneously decompressed area on the left lateral first metatarsal head with dry white in color skin overlying the former fluid filled pocket area. On the plantar surface of the left first metatarsal head area there is a heavy callous with a hole in it approximately the diameter of a cotton tipped applicator. There is no drainage or odor from either area. The spouse tells me they have an appointment at the wound center in [place] this Wednesday, which I have encouraged them to keep if she is discharged at that time. Pressure Injury POA: Yes/No/NA Measurement: Wound bed: see photo Drainage (amount, consistency, odor) none Periwound: intact Dressing procedure/placement/frequency: Apply iodine from the swabsticks or swab pads from clean utility to the left lateral area next to the great toe, and the hole on the plantar surface.  Allow to dry. Cover with dry gauze, secure with kerlix.  This is a q shift order.  Monitor the wound area(s) for worsening of condition such as: Signs/symptoms of infection,  Increase in size,  Development of or worsening of odor, Development of pain, or increased pain at the affected locations.  Notify the medical team if any of these develop.  Thank you for the consult.  Discussed plan of care with the patient and bedside nurse.  [facility] nurse will not follow at this time.  Please re-consult the [facility] team if needed.  Val Riles, RN, MSN, CWOCN, CNS-BC, pager [phone]

## 2022-03-26 ENCOUNTER — Ambulatory Visit: Payer: Medicare Other | Admitting: Internal Medicine

## 2022-03-26 DIAGNOSIS — N182 Chronic kidney disease, stage 2 (mild): Secondary | ICD-10-CM | POA: Diagnosis not present

## 2022-03-26 DIAGNOSIS — A419 Sepsis, unspecified organism: Secondary | ICD-10-CM | POA: Diagnosis not present

## 2022-03-26 DIAGNOSIS — E1122 Type 2 diabetes mellitus with diabetic chronic kidney disease: Secondary | ICD-10-CM | POA: Diagnosis not present

## 2022-03-26 DIAGNOSIS — L03116 Cellulitis of left lower limb: Secondary | ICD-10-CM | POA: Diagnosis not present

## 2022-03-26 LAB — BASIC METABOLIC PANEL
Anion gap: 12 (ref 5–15)
BUN: 17 mg/dL (ref 8–23)
CO2: 24 mmol/L (ref 22–32)
Calcium: 8.4 mg/dL — ABNORMAL LOW (ref 8.9–10.3)
Chloride: 102 mmol/L (ref 98–111)
Creatinine, Ser: 0.99 mg/dL (ref 0.44–1.00)
GFR, Estimated: >60 mL/min (ref >60)
Glucose, Bld: 120 mg/dL — ABNORMAL HIGH (ref 70–99)
Potassium: 3.3 mmol/L — ABNORMAL LOW (ref 3.5–5.1)
Sodium: 138 mmol/L (ref 135–145)

## 2022-03-26 LAB — CBC
HCT: 33.8 % — ABNORMAL LOW (ref 36.0–46.0)
Hemoglobin: 11.3 g/dL — ABNORMAL LOW (ref 12.0–15.0)
MCH: 30.8 pg (ref 26.0–34.0)
MCHC: 33.4 g/dL (ref 30.0–36.0)
MCV: 92.1 fL (ref 80.0–100.0)
Platelets: 212 10*3/uL (ref 150–400)
RBC: 3.67 MIL/uL — ABNORMAL LOW (ref 3.87–5.11)
RDW: 13.7 % (ref 11.5–15.5)
WBC: 7.4 10*3/uL (ref 4.0–10.5)
nRBC: 0 % (ref 0.0–0.2)

## 2022-03-26 LAB — URINE CULTURE: Culture: NO GROWTH

## 2022-03-26 LAB — GLUCOSE, CAPILLARY
Glucose-Capillary: 157 mg/dL — ABNORMAL HIGH (ref 70–99)
Glucose-Capillary: 106 mg/dL — ABNORMAL HIGH (ref 70–99)
Glucose-Capillary: 293 mg/dL — ABNORMAL HIGH (ref 70–99)
Glucose-Capillary: 257 mg/dL — ABNORMAL HIGH (ref 70–99)

## 2022-03-26 MED ORDER — HYDRALAZINE HCL 20 MG/ML IJ SOLN: 10.0000 mg | INTRAMUSCULAR | Status: DC | PRN

## 2022-03-26 MED ORDER — POTASSIUM CHLORIDE 20 MEQ PO PACK
40.0000 meq | PACK | Freq: Once | ORAL | Status: AC
  Administered 2022-03-26: 40 meq via ORAL
  Filled 2022-03-26: qty 2

## 2022-03-26 MED ORDER — IPRATROPIUM-ALBUTEROL 0.5-2.5 (3) MG/3ML IN SOLN: 3.0000 mL | RESPIRATORY_TRACT | Status: DC | PRN

## 2022-03-26 MED ORDER — METOPROLOL TARTRATE 5 MG/5ML IV SOLN: 5.0000 mg | INTRAVENOUS | Status: DC | PRN

## 2022-03-26 MED ORDER — GUAIFENESIN 100 MG/5ML PO LIQD: 5.0000 mL | ORAL | Status: DC | PRN

## 2022-03-26 MED ORDER — SENNOSIDES-DOCUSATE SODIUM 8.6-50 MG PO TABS: 1.0000 | ORAL_TABLET | Freq: Every evening | ORAL | Status: DC | PRN

## 2022-03-26 MED ORDER — TRAZODONE HCL 50 MG PO TABS: 50.0000 mg | ORAL_TABLET | Freq: Every evening | ORAL | Status: DC | PRN

## 2022-03-26 NOTE — Progress Notes (Signed)
PROGRESS NOTE    Grace [name]  GQQ:[id number] DOB: [birth year] DOA: 03/24/2022 PCP: Unk Pinto, MD   Brief Narrative:  71 year old with history of DM2, sleep apnea, HTN, bilateral foot drop secondary to neuropathy admitted for left foot cellulitis complicated by left facial cellulitis.   Assessment & Plan:  Principal Problem:   Sepsis ([place]) Active Problems:   Diabetic polyneuropathy ([place])   T2_NIDDM   Mixed hyperlipidemia   Fatty liver   Diabetes mellitus type 2 with peripheral artery disease (HCC)   Diabetic macular edema of right eye with proliferative retinopathy associated with type 2 diabetes mellitus (HCC)   OSA on CPAP   CKD (chronic kidney disease) stage 2, GFR 60-89 ml/min   Cellulitis    Severe sepsis due Left foot and leg cellulitis and left facial cellulitis Lactic acidosis -Sepsis physiology has improved.  Currently patient is on IV vancomycin, Rocephin and Flagyl. I dont see much of facial cellulitis but certainly may have dental infection/abscess tehrefore would benefit from Outpatient Dentist appt.  -Follow culture data- NGTD     AKI-stage 2 chronic kidney disease, resolved -Baseline creatinine 0.9, peaked at 1.38.  Improved with fluids   Mildly elevated LFTs -Resolved  Hypokalemia - Replete as needed     Diabetes mellitus type 2 with peripheral artery disease (HCC)   Diabetic macular edema of right eye with proliferative retinopathy associated with type 2 diabetes mellitus (HCC)   Diabetic polyneuropathy (HCC) -A1c 7.8.  Metformin/glimepiride on hold. - Semglee 12 units at bedtime.  Sliding scale and Accu-Cheks.    OSA Not tolerating CPAP  DVT prophylaxis: Subcu heparin Code Status: Full code Family Communication:  Hu[place] at bedside   Status is: Inpatient Remains inpatient appropriate because: On going IV Abx for atleast next 24 hrs until the left sided facial swelling improves. I nthe meantime need PT/OT eval   Nutritional  status          There is no height or weight on file to calculate BMI.         Subjective: Tells me her left-sided facial swelling is slightly better but not completely gone yet  Does plan on following with outpatient dentist Overall feels weak and would like to see PT/OT while she is here   Examination:  General exam: Appears calm and comfortable, some left-sided facial swelling noted.  But no external evidence of cellulitis.  There is a slightly poor dentition especially around the area of the facial swelling Respiratory system: Clear to auscultation. Respiratory effort normal. Cardiovascular system: S1 & S2 heard, RRR. No JVD, murmurs, rubs, gallops or clicks. No pedal edema. Gastrointestinal system: Abdomen is nondistended, soft and nontender. No organomegaly or masses felt. Normal bowel sounds heard. Central nervous system: Alert and oriented. No focal neurological deficits. Extremities: Symmetric 5 x 5 power. Skin: No rashes, lesions or ulcers Psychiatry: Judgement and insight appear normal. Mood & affect appropriate.     Objective: Vitals:   03/25/22 2353 03/26/22 0000 03/26/22 0400 03/26/22 0800  BP: (!) 134/57 (!) 130/58 137/66 (!) 139/58  Pulse: 78 82 71 71  Resp: '20 20 20 12  '$ Temp: 97.8 F (36.6 C)  98.6 F (37 C) 98.1 F (36.7 C)  TempSrc: Oral  Oral Oral  SpO2: 95% 96% 93% 96%    Intake/Output Summary (Last 24 hours) at 03/26/2022 0825 Last data filed at 03/26/2022 0300 Gross per 24 hour  Intake 903.93 ml  Output 450 ml  Net 453.93 ml   There were  no vitals filed for this visit.   Data Reviewed:   CBC: Recent Labs  Lab 03/24/22 1307 03/25/22 0609 03/26/22 0529  WBC 10.0 8.0 7.4  NEUTROABS  --  6.0  --   HGB 13.3 11.9* 11.3*  HCT 40.1 35.5* 33.8*  MCV 92.8 91.5 92.1  PLT 347 280 081   Basic Metabolic Panel: Recent Labs  Lab 03/24/22 2143 03/25/22 0609 03/26/22 0529  NA 131* 135 138  K 4.5 3.9 3.3*  CL 93* 97* 102  CO2 '28 26 24   '$ GLUCOSE 324* 249* 120*  BUN '20 16 17  '$ CREATININE 1.38* 1.08* 0.99  CALCIUM 9.0 8.4* 8.4*   GFR: Estimated Creatinine Clearance: 51.2 mL/min (by C-G formula based on SCr of 0.99 mg/dL). Liver Function Tests: Recent Labs  Lab 03/24/22 2143 03/25/22 0609  AST 48* 37  ALT 45* 40  ALKPHOS 54 48  BILITOT 0.2* 0.5  PROT 6.1* 5.3*  ALBUMIN 3.0* 2.6*   No results for input(s): "LIPASE", "AMYLASE" in the last 168 hours. No results for input(s): "AMMONIA" in the last 168 hours. Coagulation Profile: No results for input(s): "INR", "PROTIME" in the last 168 hours. Cardiac Enzymes: Recent Labs  Lab 03/25/22 0609  CKTOTAL 110   BNP (last 3 results) No results for input(s): "PROBNP" in the last 8760 hours. HbA1C: Recent Labs    03/25/22 0645  HGBA1C 7.8*   CBG: Recent Labs  Lab 03/25/22 1633 03/25/22 2038 03/25/22 2351 03/26/22 0357 03/26/22 0803  GLUCAP 165* 111* 141* 157* 106*   Lipid Profile: No results for input(s): "CHOL", "HDL", "LDLCALC", "TRIG", "CHOLHDL", "LDLDIRECT" in the last 72 hours. Thyroid Function Tests: Recent Labs    03/25/22 0645  TSH 1.761   Anemia Panel: No results for input(s): "VITAMINB12", "FOLATE", "FERRITIN", "TIBC", "IRON", "RETICCTPCT" in the last 72 hours. Sepsis Labs: Recent Labs  Lab 03/25/22 0000 03/25/22 0645 03/25/22 0910 03/25/22 1455  LATICACIDVEN 3.1* 1.2 2.2* 1.8    Recent Results (from the past 240 hour(s))  Urine Culture     Status: Abnormal   Collection Time: 03/18/22 10:09 AM   Specimen: Urine  Result Value Ref Range Status   MICRO NUMBER: [id number]  Final   SPECIMEN QUALITY: Adequate  Final   Sample Source URINE  Final   STATUS: FINAL  Final   ISOLATE 1: Escherichia coli (A)  Final    Comment: Greater than 100,000 CFU/mL of Escherichia coli      Susceptibility   Escherichia coli - URINE CULTURE, REFLEX    AMOX/CLAVULANIC >=32 Resistant     AMPICILLIN >=32 Resistant     AMPICILLIN/SULBACTAM >=32 Resistant      CEFAZOLIN* 8 Resistant      * For uncomplicated UTI caused by E. coli, K. pneumoniae or P. mirabilis: Cefazolin is susceptible if MIC <32 mcg/mL and predicts susceptible to the oral agents cefaclor, cefdinir, cefpodoxime, cefprozil, cefuroxime, cephalexin and loracarbef.     CEFTAZIDIME <=1 Sensitive     CEFEPIME <=1 Sensitive     CEFTRIAXONE <=1 Sensitive     CIPROFLOXACIN <=0.25 Sensitive     LEVOFLOXACIN <=0.12 Sensitive     GENTAMICIN <=1 Sensitive     IMIPENEM <=0.25 Sensitive     NITROFURANTOIN <=16 Sensitive     PIP/TAZO <=4 Sensitive     TOBRAMYCIN <=1 Sensitive     TRIMETH/SULFA* <=20 Sensitive      * For uncomplicated UTI caused by E. coli, K. pneumoniae or P. mirabilis: Cefazolin is susceptible if MIC <32  mcg/mL and predicts susceptible to the oral agents cefaclor, cefdinir, cefpodoxime, cefprozil, cefuroxime, cephalexin and loracarbef. Legend: S = Susceptible  I = Intermediate R = Resistant  NS = Not susceptible * = Not tested  NR = Not reported **NN = See antimicrobic comments   MICROSCOPIC MESSAGE     Status: None   Collection Time: 03/18/22 10:09 AM  Result Value Ref Range Status   Note   Final    Comment: This urine was analyzed for the presence of WBC,  RBC, bacteria, casts, and other formed elements.  Only those elements seen were reported. . .   Resp Panel by RT-PCR (Flu A&B, Covid) Peripheral     Status: None   Collection Time: 03/24/22  7:07 PM   Specimen: Peripheral; Nasal Swab  Result Value Ref Range Status   SARS Coronavirus 2 by RT PCR NEGATIVE NEGATIVE Final    Comment: (NOTE) SARS-CoV-2 target nucleic acids are NOT DETECTED.  The SARS-CoV-2 RNA is generally detectable in upper respiratory specimens during the acute phase of infection. The lowest concentration of SARS-CoV-2 viral copies this assay can detect is 138 copies/mL. A negative result does not preclude SARS-Cov-2 infection and should not be used as the sole basis for  treatment or other patient management decisions. A negative result may occur with  improper specimen collection/handling, submission of specimen other than nasopharyngeal swab, presence of viral mutation(s) within the areas targeted by this assay, and inadequate number of viral copies(<138 copies/mL). A negative result must be combined with clinical observations, patient history, and epidemiological information. The expected result is Negative.  Fact Sheet for Patients:  EntrepreneurPulse.com.au  Fact Sheet for Healthcare Providers:  IncredibleEmployment.be  This test is no t yet approved or cleared by the [place] FDA and  has been authorized for detection and/or diagnosis of SARS-CoV-2 by FDA under an Emergency Use Authorization (EUA). This EUA will remain  in effect (meaning this test can be used) for the duration of the COVID-19 declaration under Section 564(b)(1) of the Act, 21 U.S.C.section 360bbb-3(b)(1), unless the authorization is terminated  or revoked sooner.       Influenza A by PCR NEGATIVE NEGATIVE Final   Influenza B by PCR NEGATIVE NEGATIVE Final    Comment: (NOTE) The Xpert Xpress SARS-CoV-2/FLU/RSV plus assay is intended as an aid in the diagnosis of influenza from Nasopharyngeal swab specimens and should not be used as a sole basis for treatment. Nasal washings and aspirates are unacceptable for Xpert Xpress SARS-CoV-2/FLU/RSV testing.  Fact Sheet for Patients: EntrepreneurPulse.com.au  Fact Sheet for Healthcare Providers: IncredibleEmployment.be  This test is not yet approved or cleared by the [place] FDA and has been authorized for detection and/or diagnosis of SARS-CoV-2 by FDA under an Emergency Use Authorization (EUA). This EUA will remain in effect (meaning this test can be used) for the duration of the COVID-19 declaration under Section 564(b)(1) of the Act, 21  U.S.C. section 360bbb-3(b)(1), unless the authorization is terminated or revoked.  Performed at [facility], [place] [address] [place], [place] [address]   Blood culture (routine x 2)     Status: None (Preliminary result)   Collection Time: 03/24/22  8:05 PM   Specimen: BLOOD  Result Value Ref Range Status   Specimen Description BLOOD RIGHT ANTECUBITAL  Final   Special Requests   Final    BOTTLES DRAWN AEROBIC AND ANAEROBIC Blood Culture results may not be optimal due to an excessive volume of blood received in culture  bottles   Culture   Final    NO GROWTH 2 DAYS Performed at [facility], [place] [address] [place], [place] [address]    Report Status PENDING  Incomplete  Blood culture (routine x 2)     Status: None (Preliminary result)   Collection Time: 03/25/22  9:10 AM   Specimen: BLOOD  Result Value Ref Range Status   Specimen Description BLOOD BLOOD RIGHT FOREARM  Final   Special Requests   Final    BOTTLES DRAWN AEROBIC ONLY Blood Culture results may not be optimal due to an inadequate volume of blood received in culture bottles   Culture   Final    NO GROWTH < 24 HOURS Performed at [facility], [place] [address] [place], [place] [address]    Report Status PENDING  Incomplete         Radiology Studies: US Abdomen Limited RUQ (LIVER/GB)  Result Date: 03/25/2022 CLINICAL DATA:  Pain EXAM: ULTRASOUND ABDOMEN LIMITED RIGHT UPPER QUADRANT COMPARISON:  03/25/2022 FINDINGS: Gallbladder: Mobile shadowing gallstone measures up to 2 cm. No gallbladder wall thickening or pericholecystic fluid. Negative sonographic Murphy sign. Common bile duct: Diameter: 5 mm Liver: No focal lesion identified. Within normal limits in parenchymal echogenicity. Portal vein is patent on color Doppler imaging with normal direction of blood flow towards the liver. Other: Incidental simple 2.2 cm right renal cyst does not require imaging follow-up. IMPRESSION: 1. Cholelithiasis without  acute cholecystitis. Electronically Signed   By: Randa Ngo M.D.   On: 03/25/2022 22:44   MR FOOT LEFT WO CONTRAST  Result Date: 03/25/2022 CLINICAL DATA:  Diabetic foot wound EXAM: MRI OF THE LEFT FOOT WITHOUT CONTRAST TECHNIQUE: Multiplanar, multisequence MR imaging of the left forefoot was performed. No intravenous contrast was administered. COMPARISON:  X-ray 03/25/2022 FINDINGS: Technical Note: Despite efforts by the technologist and patient, motion artifact is present on today's exam and could not be eliminated. This reduces exam sensitivity and specificity. Bones/Joint/Cartilage Motion degraded images without evidence of acute fracture or dislocation. No definite site of bone marrow edema or marrow replacement. No discernible erosions. No large joint effusion. Ligaments Intact Lisfranc ligament. No evidence of collateral ligament disruption. Muscles and Tendons Denervation changes of the intrinsic foot musculature. Grossly intact flexor and extensor tendons. No large tenosynovial fluid collection. Soft tissues Subcutaneous edema, most pronounced over the dorsal aspect of the forefoot. No deep soft tissue ulceration. No organized fluid collection is seen. IMPRESSION: 1. Motion degraded images. 2. No evidence of acute osteomyelitis within the limitations of this exam. 3. Subcutaneous edema, most pronounced over the dorsal aspect of the forefoot. No deep soft tissue ulceration. No organized fluid collection. Electronically Signed   By: Davina Poke D.O.   On: 03/25/2022 08:40   CT RENAL STONE STUDY  Result Date: 03/25/2022 CLINICAL DATA:  Flank pain.  Urinary tract infection. EXAM: CT ABDOMEN AND PELVIS WITHOUT CONTRAST TECHNIQUE: Multidetector CT imaging of the abdomen and pelvis was performed following the standard protocol without IV contrast. RADIATION DOSE REDUCTION: This exam was performed according to the departmental dose-optimization program which includes automated exposure control,  adjustment of the mA and/or kV according to patient size and/or use of iterative reconstruction technique. COMPARISON:  Abdomen pelvis CT 03/24/2022 FINDINGS: Lower chest: Unremarkable. Hepatobiliary: No suspicious focal abnormality in the liver on this study without intravenous contrast. Tumefactive sludge versus noncalcified gallstone (image 26/series 3). No intrahepatic or extrahepatic biliary dilation. Pancreas: No focal mass lesion. No dilatation of the  main duct. No intraparenchymal cyst. No peripancreatic edema. Spleen: No splenomegaly. No focal mass lesion. Adrenals/Urinary Tract: No adrenal nodule or mass. Small simple right renal cyst again noted. No followup recommended. Both kidneys are excreting contrast from yesterday's CT scan which would obscure collecting system stones although no stones are visible on yesterday's study. No evidence for hydroureter. The urinary bladder appears normal for the degree of distention. Stomach/Bowel: Tiny hiatal hernia. Stomach otherwise unremarkable. Duodenum is normally positioned as is the ligament of Treitz. No small bowel wall thickening. No small bowel dilatation. The terminal ileum is normal. The appendix is normal. No gross colonic mass. No colonic wall thickening. Vascular/Lymphatic: There is moderate atherosclerotic calcification of the abdominal aorta without aneurysm. There is no gastrohepatic or hepatoduodenal ligament lymphadenopathy. No retroperitoneal or mesenteric lymphadenopathy. No pelvic sidewall lymphadenopathy. Reproductive: The uterus is surgically absent. There is no adnexal mass. Other: No intraperitoneal free fluid. Musculoskeletal: Small bilateral groin hernias contain only fat. Status post right hip replacement. No worrisome lytic or sclerotic osseous abnormality. IMPRESSION: 1. No acute findings in the abdomen or pelvis. Specifically, no findings to explain the patient's history of flank pain. 2. Tumefactive sludge versus noncalcified  gallstone. 3. Small bilateral groin hernias contain only fat. 4. Aortic Atherosclerosis (ICD10-I70.0). Electronically Signed   By: Misty Stanley M.D.   On: 03/25/2022 05:12   DG Foot 2 Views Left  Result Date: 03/25/2022 CLINICAL DATA:  Left foot wound EXAM: LEFT FOOT - 2 VIEW COMPARISON:  None Available. FINDINGS: Osseous structures are mildly osteopenic. No acute fracture or dislocation. No erosions or abnormal periosteal reaction. Mild soft tissue swelling of the left forefoot. Small superior and plantar calcaneal spurs are present. Bimalleolar ORIF has been performed, not well assessed on this exam. IMPRESSION: Mild soft tissue swelling. No radiographic evidence of osteomyelitis. Electronically Signed   By: Fidela Salisbury M.D.   On: 03/25/2022 02:36   CT ABDOMEN PELVIS W CONTRAST  Result Date: 03/24/2022 CLINICAL DATA:  Right lower quadrant nausea and vomiting, abdominal pain EXAM: CT ABDOMEN AND PELVIS WITH CONTRAST TECHNIQUE: Multidetector CT imaging of the abdomen and pelvis was performed using the standard protocol following bolus administration of intravenous contrast. RADIATION DOSE REDUCTION: This exam was performed according to the departmental dose-optimization program which includes automated exposure control, adjustment of the mA and/or kV according to patient size and/or use of iterative reconstruction technique. CONTRAST:  55m OMNIPAQUE IOHEXOL 300 MG/ML  SOLN COMPARISON:  None Available. FINDINGS: Lower chest: No acute pleural or parenchymal lung disease. Hepatobiliary: No focal liver abnormality is seen. No gallstones, gallbladder wall thickening, or biliary dilatation. Pancreas: Unremarkable. No pancreatic ductal dilatation or surrounding inflammatory changes. Spleen: Normal in size without focal abnormality. Adrenals/Urinary Tract: Kidneys are grossly unremarkable with no evidence of focal solid lesion. Normal parenchymal enhancement. The adrenals and bladder are unremarkable.  Stomach/Bowel: There are minimally distended loops of small bowel with scattered gas fluid levels, which may reflect an element of ileus or gastroenteritis. Moderate gas and stool throughout the colon. No evidence of high-grade obstruction. Normal appendix right lower quadrant. No bowel wall thickening or inflammatory change. Small hiatal hernia. Vascular/Lymphatic: Aortic atherosclerosis. No enlarged abdominal or pelvic lymph nodes. Reproductive: Status post hysterectomy. No adnexal masses. Other: No free fluid or free intraperitoneal gas. Bilateral fat containing inguinal hernias. Musculoskeletal: Right hip arthroplasty. No acute or destructive bony lesions. Reconstructed images demonstrate no additional findings. IMPRESSION: 1. Mild small bowel distension with scattered gas fluid levels, which could reflect gastroenteritis and/or  mild ileus. No evidence of high-grade bowel obstruction. 2. Small hiatal hernia. 3. Normal appendix. 4.  Aortic Atherosclerosis (ICD10-I70.0). Electronically Signed   By: Randa Ngo M.D.   On: 03/24/2022 22:52   DG Chest 2 View  Result Date: 03/24/2022 CLINICAL DATA:  Weakness.  Fever. EXAM: CHEST - 2 VIEW COMPARISON:  02/11/2011 FINDINGS: The heart size and mediastinal contours are within normal limits. Both lungs are clear. The visualized skeletal structures are unremarkable. IMPRESSION: No active cardiopulmonary disease. Electronically Signed   By: Nelson Chimes M.D.   On: 03/24/2022 19:39        Scheduled Meds:  cholecalciferol  5,000 Units Oral Daily   DULoxetine  30 mg Oral Daily   heparin  5,000 Units Subcutaneous Q8H   insulin aspart  0-9 Units Subcutaneous TID WC   insulin glargine-yfgn  12 Units Subcutaneous QHS   magnesium oxide  400 mg Oral Daily   Continuous Infusions:  cefTRIAXone (ROCEPHIN)  IV 2 g (03/26/22 0248)   And   metronidazole 500 mg (03/26/22 0138)   vancomycin       LOS: 1 day   Time spent= 35 mins    Natoria Archibald Arsenio Loader, MD [facility]  If 7PM-7AM, please contact night-coverage  03/26/2022, 8:25 AM

## 2022-03-26 NOTE — Evaluation (Addendum)
Physical Therapy Evaluation Patient Details Name: Grace [name] MRN: [id number] DOB: 1951/06/16 Today's Date: 03/26/2022  History of Present Illness  Patient is a 71 yo female presenting to the ED on 03/24/22 due to weakness. Patient diagnosed with E coli UTI 1 week prior. Admitted 03/25/22 with cellulitis of LLE and L face and sepsis. PMH includes: DM2, sleep apnea, HTN and bilateral foot drop secondary to neuropathy   Clinical Impression  Pt presents with condition above and deficits mentioned below, see PT Problem List. PTA, she was mod I, intermittently using a RW, living with her husband in a 1-level house with 2 STE and 1 stair inside to enter/exit the living room. Currently, pt demonstrates slow, but steady gait with no overt LOB, only requiring min guard-supervision for safety with all mobility using a RW. She demonstrates deficits in endurance, strength, and balance. Patient reports diagnosis of UTI one week ago, and had fallen 2-3x last week resulting in fire department being called out to the house to assist. As pt's stability seems to have improved since then, recommending pt d/c home with HHPT follow-up. Will continue to follow acutely.       Recommendations for follow up therapy are one component of a multi-disciplinary discharge planning process, led by the attending physician.  Recommendations may be updated based on patient status, additional functional criteria and insurance authorization.  Follow Up Recommendations Home health PT      Assistance Recommended at Discharge Set up Supervision/Assistance  Patient can return home with the following  Help with stairs or ramp for entrance;Assist for transportation;Assistance with cooking/housework    Equipment Recommendations None recommended by PT  Recommendations for Other Services       Functional Status Assessment Patient has had a recent decline in their functional status and demonstrates the ability to make significant  improvements in function in a reasonable and predictable amount of time.     Precautions / Restrictions Precautions Precautions: Fall Precaution Comments: wound on L foot, is being followed by wound care actuely and has outpatient follow up Restrictions Weight Bearing Restrictions: No      Mobility  Bed Mobility               General bed mobility comments: up in chair upon arrival    Transfers Overall transfer level: Needs assistance Equipment used: Rolling walker (2 wheels) Transfers: Sit to/from Stand Sit to Stand: Min guard           General transfer comment: min guard for safety, good hand placement and ability to power up without increased effort from recliner. Slightly increased effort pulling up on grab bar to stand from low commode.    Ambulation/Gait Ambulation/Gait assistance: Min guard, Supervision Gait Distance (Feet): 200 Feet Assistive device: Rolling walker (2 wheels) Gait Pattern/deviations: Step-through pattern, Decreased stride length, Decreased dorsiflexion - right, Decreased dorsiflexion - left Gait velocity: reduced Gait velocity interpretation: <1.31 ft/sec, indicative of household ambulator   General Gait Details: Pt with slow, but steady gait using RW. No LOB when changing directions or head positions. Min guard-supervision for safety. Hx of foot drop.  Stairs            Wheelchair Mobility    Modified Rankin (Stroke Patients Only)       Balance Overall balance assessment: Needs assistance Sitting-balance support: No upper extremity supported, Feet supported Sitting balance-Leahy Scale: Good     Standing balance support: Bilateral upper extremity supported, No upper extremity supported, During functional activity Standing  balance-Leahy Scale: Fair Standing balance comment: able to reach off BOS slightly without UE support, but benefits from RW when ambulating                             Pertinent Vitals/Pain  Pain Assessment Pain Assessment: Faces Faces Pain Scale: Hurts little more Pain Location: stomach Pain Descriptors / Indicators: Grimacing, Guarding, Sharp Pain Intervention(s): Limited activity within patient's tolerance, Monitored during session, Repositioned    Home Living Family/patient expects to be discharged to:: Private residence Living Arrangements: Spouse/significant other Available Help at Discharge: Family;Friend(s) Type of Home: House Home Access: Stairs to enter Entrance Stairs-Rails: Left Entrance Stairs-Number of Steps: 2   Home Layout: One level (Sunken living room with step down) Home Equipment: Shower seat - built Medical sales representative (2 wheels);Grab bars - tub/shower;Cane - single point;Adaptive equipment (Sock aid)      Prior Function Prior Level of Function : Independent/Modified Independent;History of Falls (last six months)             Mobility Comments: occaisonally walks with a RW ADLs Comments: independent, was able to shower without issue prior to ED admission     Hand Dominance   Dominant Hand: Right    Extremity/Trunk Assessment   Upper Extremity Assessment Upper Extremity Assessment: Defer to OT evaluation    Lower Extremity Assessment Lower Extremity Assessment: Generalized weakness    Cervical / Trunk Assessment Cervical / Trunk Assessment: Normal (minimally)  Communication   Communication: No difficulties  Cognition Arousal/Alertness: Awake/alert Behavior During Therapy: WFL for tasks assessed/performed Overall Cognitive Status: Within Functional Limits for tasks assessed                                 General Comments: Pt concerned over her husband's memory.        General Comments General comments (skin integrity, edema, etc.): recommended pt's husband not drive if he is having difficulty seeing or with his cognition per pt report    Exercises     Assessment/Plan    PT Assessment Patient needs continued  PT services  PT Problem List Decreased strength;Decreased activity tolerance;Decreased balance;Decreased mobility       PT Treatment Interventions DME instruction;Gait training;Stair training;Therapeutic activities;Functional mobility training;Therapeutic exercise;Neuromuscular re-education;Balance training;Patient/family education    PT Goals (Current goals can be found in the Care Plan section)  Acute Rehab PT Goals Patient Stated Goal: to get better PT Goal Formulation: With patient/family Time For Goal Achievement: 04/09/22 Potential to Achieve Goals: Good    Frequency Min 3X/week     Co-evaluation               AM-PAC PT "6 Clicks" Mobility  Outcome Measure Help needed turning from your back to your side while in a flat bed without using bedrails?: A Little Help needed moving from lying on your back to sitting on the side of a flat bed without using bedrails?: A Little Help needed moving to and from a bed to a chair (including a wheelchair)?: A Little Help needed standing up from a chair using your arms (e.g., wheelchair or bedside chair)?: A Little Help needed to walk in hospital room?: A Little Help needed climbing 3-5 steps with a railing? : A Little 6 Click Score: 18    End of Session Equipment Utilized During Treatment: Gait belt Activity Tolerance: Patient tolerated treatment well Patient left: in  chair;with call bell/phone within reach;with family/visitor present   PT Visit Diagnosis: Unsteadiness on feet (R26.81);Other abnormalities of gait and mobility (R26.89);Muscle weakness (generalized) (M62.81);Difficulty in walking, not elsewhere classified (R26.2);History of falling (Z91.81)    Time: 1859-0931 PT Time Calculation (min) (ACUTE ONLY): 28 min   Charges:   PT Evaluation $PT Eval Moderate Complexity: 1 Mod PT Treatments $Gait Training: 8-22 mins        Moishe Spice, PT, DPT Acute Rehabilitation Services  Office: [place] 03/26/2022, 4:05 PM

## 2022-03-26 NOTE — Progress Notes (Signed)
  Transition of Care [facility]) Screening Note   Patient Details  Name: Grace [name] Date of Birth: Aug 20, 1950   Transition of Care [facility]) CM/SW Contact:    Cyndi Bender, RN Phone Number: 03/26/2022, 9:27 AM    Transition of Care Department [facility]) has reviewed patient and no TOC needs have been identified at this time. We will continue to monitor patient advancement through interdisciplinary progression rounds. If new patient transition needs arise, please place a TOC consult.

## 2022-03-26 NOTE — Progress Notes (Signed)
Pt refused CPAP for the night.   

## 2022-03-26 NOTE — Evaluation (Signed)
Occupational Therapy Evaluation Patient Details Name: Grace [name] MRN: [id number] DOB: [birth year] Today's Date: 03/26/2022   History of Present Illness Patient is a 71 yo female presenting to the ED on 03/24/22 due to weakness. Patient diagnosed with E coli UTI 1 week prior. Admitted 03/25/22 with cellulitis of LLE and L face and sepsis. PMH includes: DM2, sleep apnea, HTN and bilateral foot drop secondary to neuropathy   Clinical Impression   Prior to this admission, patient was living independently with husband and independent in ADLs and mobility. Patient reports diagnosis of UTI one week ago, and had fallen 2-3x last week resulting in fire department being called out to the house to assist. Patient uses a walker intermittently at baseline. Currently, patient is min guard for ADLs, and min guard for transfers. OT recommending HHOT at time of discharge to promote increased activity tolerance. OT will continue to follow acutely.      Recommendations for follow up therapy are one component of a multi-disciplinary discharge planning process, led by the attending physician.  Recommendations may be updated based on patient status, additional functional criteria and insurance authorization.   Follow Up Recommendations  Home health OT    Assistance Recommended at Discharge Set up Supervision/Assistance  Patient can return home with the following A little help with walking and/or transfers;A little help with bathing/dressing/bathroom;Help with stairs or ramp for entrance;Assistance with cooking/housework    Functional Status Assessment  Patient has had a recent decline in their functional status and demonstrates the ability to make significant improvements in function in a reasonable and predictable amount of time.  Equipment Recommendations  None recommended by OT    Recommendations for Other Services       Precautions / Restrictions Precautions Precautions: Fall Precaution Comments:  wound on L foot, is being followed by wound care actuely and has outpatient follow up Restrictions Weight Bearing Restrictions: No      Mobility Bed Mobility               General bed mobility comments: up in chair upon arrival    Transfers Overall transfer level: Needs assistance Equipment used: Rolling walker (2 wheels) Transfers: Sit to/from Stand Sit to Stand: Min guard           General transfer comment: min gaurd for safety, good hand placement and ability to power up without increased effort      Balance Overall balance assessment: Mild deficits observed, not formally tested                                         ADL either performed or assessed with clinical judgement   ADL Overall ADL's : Needs assistance/impaired Eating/Feeding: Set up;Sitting   Grooming: Set up;Sitting   Upper Body Bathing: Set up;Sitting   Lower Body Bathing: Minimal assistance;Sitting/lateral leans;Sit to/from stand   Upper Body Dressing : Set up;Sitting   Lower Body Dressing: Minimal assistance;Sitting/lateral leans;Sit to/from stand   Toilet Transfer: Min guard;Ambulation;Rolling walker (2 wheels)   Toileting- Clothing Manipulation and Hygiene: Min guard;Sit to/from stand;Sitting/lateral lean       Functional mobility during ADLs: Min guard;Rolling walker (2 wheels) General ADL Comments: patient presenting with decreased activity tolerance and pain     Vision Baseline Vision/History: 1 Wears glasses;5 Retinopathy Ability to See in Adequate Light: 1 Impaired Patient Visual Report: No change from baseline  Perception     Praxis      Pertinent Vitals/Pain Pain Assessment Pain Assessment: Faces Faces Pain Scale: Hurts little more Pain Location: stomach Pain Descriptors / Indicators: Grimacing, Guarding, Sharp Pain Intervention(s): Limited activity within patient's tolerance, Monitored during session, Repositioned     Hand Dominance  Right   Extremity/Trunk Assessment Upper Extremity Assessment Upper Extremity Assessment: Generalized weakness   Lower Extremity Assessment Lower Extremity Assessment: Defer to PT evaluation   Cervical / Trunk Assessment Cervical / Trunk Assessment: Normal (minimally)   Communication Communication Communication: No difficulties   Cognition Arousal/Alertness: Awake/alert Behavior During Therapy: Flat affect Overall Cognitive Status: Within Functional Limits for tasks assessed                                 General Comments: Minimal pauses when answering questions, but able to complete, states she hasnt slept well over the past few days     General Comments       Exercises     Shoulder Instructions      Home Living Family/patient expects to be discharged to:: Private residence Living Arrangements: Spouse/significant other Available Help at Discharge: Family;Friend(s) Type of Home: House Home Access: Stairs to enter [employer] of Steps: 2 Entrance Stairs-Rails: Left Home Layout: One level (Sunken living room with step down)     Bathroom Shower/Tub: Occupational psychologist: Standard     Home Equipment: Shower seat - built Medical sales representative (2 wheels);Grab bars - tub/shower;Cane - single point;Adaptive equipment (Sock aid) Adaptive Equipment: Sock aid        Prior Functioning/Environment Prior Level of Function : Independent/Modified Independent;History of Falls (last six months)             Mobility Comments: occaisonally walks with a RW ADLs Comments: independent, was able to shower without issue prior to ED admission        OT Problem List: Decreased strength;Decreased activity tolerance;Impaired balance (sitting and/or standing);Impaired sensation;Pain;Increased edema      OT Treatment/Interventions: Self-care/ADL training;Therapeutic exercise;Energy conservation;DME and/or AE instruction;Manual therapy;Therapeutic  activities;Patient/family education;Balance training    OT Goals(Current goals can be found in the care plan section) Acute Rehab OT Goals Patient Stated Goal: to get home OT Goal Formulation: With patient/family Time For Goal Achievement: 04/09/22 Potential to Achieve Goals: Good  OT Frequency: Min 2X/week    Co-evaluation              AM-PAC OT "6 Clicks" Daily Activity     Outcome Measure Help from another person eating meals?: None Help from another person taking care of personal grooming?: A Little Help from another person toileting, which includes using toliet, bedpan, or urinal?: A Little Help from another person bathing (including washing, rinsing, drying)?: A Little Help from another person to put on and taking off regular upper body clothing?: A Little Help from another person to put on and taking off regular lower body clothing?: A Little 6 Click Score: 19   End of Session Equipment Utilized During Treatment: Rolling walker (2 wheels);Gait belt Nurse Communication: Mobility status  Activity Tolerance: Patient tolerated treatment well Patient left: in chair;with call bell/phone within reach;with family/visitor present  OT Visit Diagnosis: Unsteadiness on feet (R26.81);Repeated falls (R29.6);Muscle weakness (generalized) (M62.81);History of falling (Z91.81);Pain Pain - Right/Left: Left Pain - part of body:  (stomach)                Time:  4562-5638 OT Time Calculation (min): 21 min Charges:  OT General Charges $OT Visit: 1 Visit OT Evaluation $OT Eval Moderate Complexity: 1 Mod  [place]. Skylar Flynt, OTR/L Acute Rehabilitation Services [phone]   Ascencion Dike 03/26/2022, 3:26 PM

## 2022-03-27 DIAGNOSIS — E1151 Type 2 diabetes mellitus with diabetic peripheral angiopathy without gangrene: Secondary | ICD-10-CM | POA: Diagnosis not present

## 2022-03-27 DIAGNOSIS — E782 Mixed hyperlipidemia: Secondary | ICD-10-CM

## 2022-03-27 DIAGNOSIS — L03116 Cellulitis of left lower limb: Secondary | ICD-10-CM | POA: Diagnosis not present

## 2022-03-27 DIAGNOSIS — E113511 Type 2 diabetes mellitus with proliferative diabetic retinopathy with macular edema, right eye: Secondary | ICD-10-CM | POA: Diagnosis not present

## 2022-03-27 DIAGNOSIS — K760 Fatty (change of) liver, not elsewhere classified: Secondary | ICD-10-CM

## 2022-03-27 DIAGNOSIS — A419 Sepsis, unspecified organism: Secondary | ICD-10-CM | POA: Diagnosis not present

## 2022-03-27 LAB — BASIC METABOLIC PANEL
Anion gap: 8 (ref 5–15)
BUN: 17 mg/dL (ref 8–23)
CO2: 25 mmol/L (ref 22–32)
Calcium: 8.6 mg/dL — ABNORMAL LOW (ref 8.9–10.3)
Chloride: 104 mmol/L (ref 98–111)
Creatinine, Ser: 0.96 mg/dL (ref 0.44–1.00)
GFR, Estimated: >60 mL/min (ref >60)
Glucose, Bld: 192 mg/dL — ABNORMAL HIGH (ref 70–99)
Potassium: 3.9 mmol/L (ref 3.5–5.1)
Sodium: 137 mmol/L (ref 135–145)

## 2022-03-27 LAB — GLUCOSE, CAPILLARY
Glucose-Capillary: 145 mg/dL — ABNORMAL HIGH (ref 70–99)
Glucose-Capillary: 297 mg/dL — ABNORMAL HIGH (ref 70–99)

## 2022-03-27 LAB — CBC
HCT: 32.8 % — ABNORMAL LOW (ref 36.0–46.0)
Hemoglobin: 11 g/dL — ABNORMAL LOW (ref 12.0–15.0)
MCH: 30.6 pg (ref 26.0–34.0)
MCHC: 33.5 g/dL (ref 30.0–36.0)
MCV: 91.1 fL (ref 80.0–100.0)
Platelets: 285 10*3/uL (ref 150–400)
RBC: 3.6 MIL/uL — ABNORMAL LOW (ref 3.87–5.11)
RDW: 13.6 % (ref 11.5–15.5)
WBC: 7.5 10*3/uL (ref 4.0–10.5)
nRBC: 0 % (ref 0.0–0.2)

## 2022-03-27 LAB — MAGNESIUM: Magnesium: 1.5 mg/dL — ABNORMAL LOW (ref 1.7–2.4)

## 2022-03-27 MED ORDER — ONDANSETRON HCL 4 MG/2ML IJ SOLN
4.0000 mg | Freq: Four times a day (QID) | INTRAMUSCULAR | Status: DC | PRN
  Administered 2022-03-27: 4 mg via INTRAVENOUS
  Filled 2022-03-27: qty 2

## 2022-03-27 MED ORDER — AMOXICILLIN-POT CLAVULANATE 875-125 MG PO TABS: 1.0000 | ORAL_TABLET | Freq: Two times a day (BID) | ORAL | 0 refills | Status: AC

## 2022-03-27 NOTE — Progress Notes (Addendum)
Physical Therapy Treatment Patient Details Name: Grace [name] MRN: [id number] DOB: [birth year] Today's Date: 03/27/2022   History of Present Illness Patient is a 71 yo female presenting to the ED on 03/24/22 due to weakness. Patient diagnosed with E coli UTI 1 week prior. Admitted 03/25/22 with cellulitis of LLE and L face and sepsis. PMH includes: DM2, sleep apnea, HTN and bilateral foot drop secondary to neuropathy    PT Comments    Pt received in chair, agreeable to therapy session, pt spouse in room during session and instructed on guarding positions while pt ascending/descending stairs. Pt able to progress gait to longer household distances using RW and chair follow for safety and needing up to min guard for functional mobility tasks including stair ascent/descent per home entry setup. DME recommendations updated per discussion with pt and supervising PT Anessa P. Pt continues to benefit from PT services to progress toward functional mobility goals.    Recommendations for follow up therapy are one component of a multi-disciplinary discharge planning process, led by the attending physician.  Recommendations may be updated based on patient status, additional functional criteria and insurance authorization.  Follow Up Recommendations  Home health PT     Assistance Recommended at Discharge Set up Supervision/Assistance  Patient can return home with the following Help with stairs or ramp for entrance;Assist for transportation;Assistance with cooking/housework   Equipment Recommendations  Rollator (4 wheels) (pt RW was second hand, needs 4WW bench seat for energy conservation)    Recommendations for Other Services       Precautions / Restrictions Precautions Precautions: Fall Precaution Comments: wound on L foot, is being followed by wound care actuely and has outpatient follow up Restrictions Weight Bearing Restrictions: No     Mobility  Bed Mobility               General  bed mobility comments: up in chair upon arrival    Transfers Overall transfer level: Needs assistance Equipment used: Rolling walker (2 wheels) Transfers: Sit to/from Stand Sit to Stand: Supervision           General transfer comment: cues for UE placement    Ambulation/Gait Ambulation/Gait assistance: Supervision Gait Distance (Feet): 125 Feet (115f x2 with seated break between) Assistive device: Rolling walker (2 wheels) Gait Pattern/deviations: Step-through pattern, Decreased stride length, Decreased dorsiflexion - right, Decreased dorsiflexion - left (steppage gait) Gait velocity: reduced Gait velocity interpretation: <1.8 ft/sec, indicate of risk for recurrent falls   General Gait Details: supervision with chair follow for safety as pt needing to ambulate longer distance this session to get to stairwell. no LOB, good use of RW.   Stairs Stairs: Yes Stairs assistance: Min guard Stair Management: One rail Left, Step to pattern, Forwards Number of Stairs: 2 General stair comments: cues for safety and step sequencing, no LOB, increased time/encouragement to perform.     Balance Overall balance assessment: Needs assistance Sitting-balance support: No upper extremity supported, Feet supported Sitting balance-Leahy Scale: Good     Standing balance support: Bilateral upper extremity supported, No upper extremity supported, During functional activity Standing balance-Leahy Scale: Fair Standing balance comment: able to reach off BOS slightly without UE support, but benefits from RW when ambulating                            Cognition Arousal/Alertness: Awake/alert Behavior During Therapy: WFL for tasks assessed/performed Overall Cognitive Status: Within Functional Limits for tasks assessed  General Comments: Pt concerned over her husband's memory.        Exercises      General Comments General comments  (skin integrity, edema, etc.): VSS on RA, HR 70's-80's with exertion      Pertinent Vitals/Pain Pain Assessment Pain Assessment: Faces Faces Pain Scale: Hurts a little bit Pain Location: LLE tender to touch Pain Descriptors / Indicators: Tender Pain Intervention(s): Monitored during session, Repositioned     PT Goals (current goals can now be found in the care plan section) Acute Rehab PT Goals Patient Stated Goal: to get better PT Goal Formulation: With patient/family Time For Goal Achievement: 04/09/22 Progress towards PT goals: Progressing toward goals    Frequency    Min 3X/week      PT Plan Equipment recommendations need to be updated       AM-PAC PT "6 Clicks" Mobility   Outcome Measure  Help needed turning from your back to your side while in a flat bed without using bedrails?: A Little Help needed moving from lying on your back to sitting on the side of a flat bed without using bedrails?: A Little Help needed moving to and from a bed to a chair (including a wheelchair)?: A Little Help needed standing up from a chair using your arms (e.g., wheelchair or bedside chair)?: A Little Help needed to walk in hospital room?: A Little Help needed climbing 3-5 steps with a railing? : A Little 6 Click Score: 18    End of Session Equipment Utilized During Treatment: Gait belt Activity Tolerance: Patient tolerated treatment well Patient left: in chair;with call bell/phone within reach;with family/visitor present (pillow placed on recliner for LLE elevation, pt offered ice for jaw/LLE pain but declined) Nurse Communication: Mobility status PT Visit Diagnosis: Unsteadiness on feet (R26.81);Other abnormalities of gait and mobility (R26.89);Muscle weakness (generalized) (M62.81);Difficulty in walking, not elsewhere classified (R26.2);History of falling (Z91.81)     Time: 6333-5456 PT Time Calculation (min) (ACUTE ONLY): 32 min  Charges:  $Gait Training: 23-37 mins                      Raunak Antuna P., PTA Acute Rehabilitation Services Secure Chat Preferred 9a-5:30pm Office: [place] 03/27/2022, 11:28 AM

## 2022-03-27 NOTE — Discharge Summary (Signed)
Physician Discharge Summary  Grace [name] GGE:[id number] DOB: 1950/12/11 DOA: 03/24/2022  PCP: Unk Pinto, MD  Admit date: 03/24/2022 Discharge date: 03/27/2022  Admitted From: Home Disposition:  Home  Recommendations for Outpatient Follow-up:  Follow up with PCP in 1-2 weeks Please obtain BMP/CBC in one week your next doctors visit.  Augmentin PO BID x 8 days  Needs to see outpatient dentist  Home Health: PT/OT Equipment/Devices: None Discharge Condition: Stable CODE STATUS: Full  Diet recommendation: Diabetic  Brief/Interim Summary: 71 year old with history of DM2, sleep apnea, HTN, bilateral foot drop secondary to neuropathy admitted for left foot cellulitis complicated by left facial cellulitis.  Over the course of 3 days in the hospital with IV antibiotics patient did really well with improvement in her cellulitis.  Upon further examination it looks like she could have dental abscess therefore recommended outpatient follow-up with dentist.  Will transition her antibiotics to p.o.  Medically stable for discharge today.  She did well with physical therapy today Husband present at bedside     Assessment & Plan:  Principal Problem:   Sepsis ([place]) Active Problems:   Diabetic polyneuropathy ([place])   T2_NIDDM   Mixed hyperlipidemia   Fatty liver   Diabetes mellitus type 2 with peripheral artery disease ([place])   Diabetic macular edema of right eye with proliferative retinopathy associated with type 2 diabetes mellitus (HCC)   OSA on CPAP   CKD (chronic kidney disease) stage 2, GFR 60-89 ml/min   Cellulitis     Severe sepsis due Left foot and leg cellulitis and left facial cellulitis Lactic acidosis -Sepsis physiology has improved.  Suspect she may have underlying dental abscess/infection therefore recommended outpatient follow-up with a dentist in next 1 week.  Will give her 8 days of oral Augmentin upon discharge.  PT/OT-recommended home health therefore arrangements  made -Follow culture data- NGTD     AKI-stage 2 chronic kidney disease, resolved -Baseline creatinine 0.9, peaked at 1.38.  Creatinine on discharge 0.96   Mildly elevated LFTs -Resolved   Hypokalemia - Replete as needed     Diabetes mellitus type 2 with peripheral artery disease (HCC)   Diabetic macular edema of right eye with proliferative retinopathy associated with type 2 diabetes mellitus (HCC)   Diabetic polyneuropathy (HCC) -A1c 7.8.  Resume home metformin and glimepiride    OSA Not tolerating CPAP     Assessment and Plan: No notes have been filed under this hospital service. Service: Hospitalist      There is no height or weight on file to calculate BMI.       Discharge Diagnoses:  Principal Problem:   Sepsis ([place]) Active Problems:   Diabetic polyneuropathy ([place])   T2_NIDDM   Mixed hyperlipidemia   Fatty liver   Diabetes mellitus type 2 with peripheral artery disease (HCC)   Diabetic macular edema of right eye with proliferative retinopathy associated with type 2 diabetes mellitus (HCC)   OSA on CPAP   CKD (chronic kidney disease) stage 2, GFR 60-89 ml/min   Cellulitis      Consultations: None  Subjective: Feels well, ambulating well with physical therapy.  No other complaints.  Discharge Exam: Vitals:   03/27/22 0600 03/27/22 0935  BP: (!) 145/67 (!) 128/54  Pulse: 75 74  Resp: 16 16  Temp: 98 F (36.7 C) 98.1 F (36.7 C)  SpO2: 97% 99%   Vitals:   03/26/22 1600 03/26/22 2020 03/27/22 0600 03/27/22 0935  BP: (!) 126/103 136/67 (!) 145/67 (!) 128/54  Pulse: 72 70 75 74  Resp: '18 17 16 16  '$ Temp:  98.3 F (36.8 C) 98 F (36.7 C) 98.1 F (36.7 C)  TempSrc:  Oral Oral Oral  SpO2: 96% 96% 97% 99%    General: Pt is alert, awake, not in acute distress Cardiovascular: RRR, S1/S2 +, no rubs, no gallops Respiratory: CTA bilaterally, no wheezing, no rhonchi Abdominal: Soft, NT, ND, bowel sounds + Extremities: no edema, no  cyanosis  Discharge Instructions   Allergies as of 03/27/2022       Reactions   Gabapentin Swelling   Swelling in legs and feet        Medication List     STOP taking these medications    doxycycline 100 MG tablet Commonly known as: VIBRA-TABS   nitrofurantoin (macrocrystal-monohydrate) 100 MG capsule Commonly known as: Macrobid       TAKE these medications    acetaminophen 500 MG tablet Commonly known as: TYLENOL Take 1,500 mg by mouth every 8 (eight) hours as needed for moderate pain.   amoxicillin-clavulanate 875-125 MG tablet Commonly known as: AUGMENTIN Take 1 tablet by mouth 2 (two) times daily for 8 days.   Dialyvite Vitamin D 5000 125 MCG (5000 UT) capsule Generic drug: Cholecalciferol Take 5,000 Units by mouth daily.   DULoxetine 30 MG capsule Commonly known as: CYMBALTA Take 1 capsule (30 mg total) by mouth daily.   glimepiride 2 MG tablet Commonly known as: AMARYL Take  1 tablet  2 x /day  with Meals  for Diabetes What changed:  how much to take how to take this when to take this additional instructions   Magnesium 250 MG Tabs Take 500 mg by mouth 2 (two) times daily.   metFORMIN 500 MG 24 hr tablet Commonly known as: GLUCOPHAGE-XR TAKE 2 TABLETS BY MOUTH TWICE DAILY FOR DIABETES What changed:  how much to take how to take this when to take this additional instructions   Omega-3 350 MG Cpdr Take 350 mg by mouth 2 (two) times daily.   zinc gluconate 50 MG tablet Take 50 mg by mouth daily.               Durable Medical Equipment  (From admission, onward)           Start     Ordered   03/27/22 1147  For home use only DME 4 wheeled rolling walker with seat  Once       Question:  Patient needs a walker to treat with the following condition  Answer:  Weakness   03/27/22 1147            Follow-up Information     Unk Pinto, MD. Schedule an appointment as soon as possible for a visit in 2 week(s).    Specialty: Internal Medicine Contact information: [address] [place] [address] [phone]         [facility] Follow up.   Specialty: Home Health Services Why: Home Health PT/OT arranged. They will contact you within 1 to 2 days after discharge to schedule apt. Contact information: [address] [place] [address] [phone]                Allergies  Allergen Reactions   Gabapentin Swelling    Swelling in legs and feet    You were cared for by a hospitalist during your hospital stay. If you have any questions about your discharge medications or the care you  received while you were in the hospital after you are discharged, you can call the unit and asked to speak with the hospitalist on call if the hospitalist that took care of you is not available. Once you are discharged, your primary care physician will handle any further medical issues. Please note that no refills for any discharge medications will be authorized once you are discharged, as it is imperative that you return to your primary care physician (or establish a relationship with a primary care physician if you do not have one) for your aftercare needs so that they can reassess your need for medications and monitor your lab values.   Procedures/Studies: US Abdomen Limited RUQ (LIVER/GB)  Result Date: 03/25/2022 CLINICAL DATA:  Pain EXAM: ULTRASOUND ABDOMEN LIMITED RIGHT UPPER QUADRANT COMPARISON:  03/25/2022 FINDINGS: Gallbladder: Mobile shadowing gallstone measures up to 2 cm. No gallbladder wall thickening or pericholecystic fluid. Negative sonographic Murphy sign. Common bile duct: Diameter: 5 mm Liver: No focal lesion identified. Within normal limits in parenchymal echogenicity. Portal vein is patent on color Doppler imaging with normal direction of blood flow towards the liver. Other: Incidental simple 2.2 cm right renal cyst does not require imaging  follow-up. IMPRESSION: 1. Cholelithiasis without acute cholecystitis. Electronically Signed   By: Randa Ngo M.D.   On: 03/25/2022 22:44   MR FOOT LEFT WO CONTRAST  Result Date: 03/25/2022 CLINICAL DATA:  Diabetic foot wound EXAM: MRI OF THE LEFT FOOT WITHOUT CONTRAST TECHNIQUE: Multiplanar, multisequence MR imaging of the left forefoot was performed. No intravenous contrast was administered. COMPARISON:  X-ray 03/25/2022 FINDINGS: Technical Note: Despite efforts by the technologist and patient, motion artifact is present on today's exam and could not be eliminated. This reduces exam sensitivity and specificity. Bones/Joint/Cartilage Motion degraded images without evidence of acute fracture or dislocation. No definite site of bone marrow edema or marrow replacement. No discernible erosions. No large joint effusion. Ligaments Intact Lisfranc ligament. No evidence of collateral ligament disruption. Muscles and Tendons Denervation changes of the intrinsic foot musculature. Grossly intact flexor and extensor tendons. No large tenosynovial fluid collection. Soft tissues Subcutaneous edema, most pronounced over the dorsal aspect of the forefoot. No deep soft tissue ulceration. No organized fluid collection is seen. IMPRESSION: 1. Motion degraded images. 2. No evidence of acute osteomyelitis within the limitations of this exam. 3. Subcutaneous edema, most pronounced over the dorsal aspect of the forefoot. No deep soft tissue ulceration. No organized fluid collection. Electronically Signed   By: Davina Poke D.O.   On: 03/25/2022 08:40   CT RENAL STONE STUDY  Result Date: 03/25/2022 CLINICAL DATA:  Flank pain.  Urinary tract infection. EXAM: CT ABDOMEN AND PELVIS WITHOUT CONTRAST TECHNIQUE: Multidetector CT imaging of the abdomen and pelvis was performed following the standard protocol without IV contrast. RADIATION DOSE REDUCTION: This exam was performed according to the departmental dose-optimization program  which includes automated exposure control, adjustment of the mA and/or kV according to patient size and/or use of iterative reconstruction technique. COMPARISON:  Abdomen pelvis CT 03/24/2022 FINDINGS: Lower chest: Unremarkable. Hepatobiliary: No suspicious focal abnormality in the liver on this study without intravenous contrast. Tumefactive sludge versus noncalcified gallstone (image 26/series 3). No intrahepatic or extrahepatic biliary dilation. Pancreas: No focal mass lesion. No dilatation of the main duct. No intraparenchymal cyst. No peripancreatic edema. Spleen: No splenomegaly. No focal mass lesion. Adrenals/Urinary Tract: No adrenal nodule or mass. Small simple right renal cyst again noted. No followup recommended. Both kidneys are excreting contrast from yesterday's CT  scan which would obscure collecting system stones although no stones are visible on yesterday's study. No evidence for hydroureter. The urinary bladder appears normal for the degree of distention. Stomach/Bowel: Tiny hiatal hernia. Stomach otherwise unremarkable. Duodenum is normally positioned as is the ligament of Treitz. No small bowel wall thickening. No small bowel dilatation. The terminal ileum is normal. The appendix is normal. No gross colonic mass. No colonic wall thickening. Vascular/Lymphatic: There is moderate atherosclerotic calcification of the abdominal aorta without aneurysm. There is no gastrohepatic or hepatoduodenal ligament lymphadenopathy. No retroperitoneal or mesenteric lymphadenopathy. No pelvic sidewall lymphadenopathy. Reproductive: The uterus is surgically absent. There is no adnexal mass. Other: No intraperitoneal free fluid. Musculoskeletal: Small bilateral groin hernias contain only fat. Status post right hip replacement. No worrisome lytic or sclerotic osseous abnormality. IMPRESSION: 1. No acute findings in the abdomen or pelvis. Specifically, no findings to explain the patient's history of flank pain. 2.  Tumefactive sludge versus noncalcified gallstone. 3. Small bilateral groin hernias contain only fat. 4. Aortic Atherosclerosis (ICD10-I70.0). Electronically Signed   By: Misty Stanley M.D.   On: 03/25/2022 05:12   DG Foot 2 Views Left  Result Date: 03/25/2022 CLINICAL DATA:  Left foot wound EXAM: LEFT FOOT - 2 VIEW COMPARISON:  None Available. FINDINGS: Osseous structures are mildly osteopenic. No acute fracture or dislocation. No erosions or abnormal periosteal reaction. Mild soft tissue swelling of the left forefoot. Small superior and plantar calcaneal spurs are present. Bimalleolar ORIF has been performed, not well assessed on this exam. IMPRESSION: Mild soft tissue swelling. No radiographic evidence of osteomyelitis. Electronically Signed   By: Fidela Salisbury M.D.   On: 03/25/2022 02:36   CT ABDOMEN PELVIS W CONTRAST  Result Date: 03/24/2022 CLINICAL DATA:  Right lower quadrant nausea and vomiting, abdominal pain EXAM: CT ABDOMEN AND PELVIS WITH CONTRAST TECHNIQUE: Multidetector CT imaging of the abdomen and pelvis was performed using the standard protocol following bolus administration of intravenous contrast. RADIATION DOSE REDUCTION: This exam was performed according to the departmental dose-optimization program which includes automated exposure control, adjustment of the mA and/or kV according to patient size and/or use of iterative reconstruction technique. CONTRAST:  61m OMNIPAQUE IOHEXOL 300 MG/ML  SOLN COMPARISON:  None Available. FINDINGS: Lower chest: No acute pleural or parenchymal lung disease. Hepatobiliary: No focal liver abnormality is seen. No gallstones, gallbladder wall thickening, or biliary dilatation. Pancreas: Unremarkable. No pancreatic ductal dilatation or surrounding inflammatory changes. Spleen: Normal in size without focal abnormality. Adrenals/Urinary Tract: Kidneys are grossly unremarkable with no evidence of focal solid lesion. Normal parenchymal enhancement. The adrenals  and bladder are unremarkable. Stomach/Bowel: There are minimally distended loops of small bowel with scattered gas fluid levels, which may reflect an element of ileus or gastroenteritis. Moderate gas and stool throughout the colon. No evidence of high-grade obstruction. Normal appendix right lower quadrant. No bowel wall thickening or inflammatory change. Small hiatal hernia. Vascular/Lymphatic: Aortic atherosclerosis. No enlarged abdominal or pelvic lymph nodes. Reproductive: Status post hysterectomy. No adnexal masses. Other: No free fluid or free intraperitoneal gas. Bilateral fat containing inguinal hernias. Musculoskeletal: Right hip arthroplasty. No acute or destructive bony lesions. Reconstructed images demonstrate no additional findings. IMPRESSION: 1. Mild small bowel distension with scattered gas fluid levels, which could reflect gastroenteritis and/or mild ileus. No evidence of high-grade bowel obstruction. 2. Small hiatal hernia. 3. Normal appendix. 4.  Aortic Atherosclerosis (ICD10-I70.0). Electronically Signed   By: MRanda NgoM.D.   On: 03/24/2022 22:52   DG Chest 2 View  Result Date: 03/24/2022 CLINICAL DATA:  Weakness.  Fever. EXAM: CHEST - 2 VIEW COMPARISON:  02/11/2011 FINDINGS: The heart size and mediastinal contours are within normal limits. Both lungs are clear. The visualized skeletal structures are unremarkable. IMPRESSION: No active cardiopulmonary disease. Electronically Signed   By: Nelson Chimes M.D.   On: 03/24/2022 19:39   Intravitreal Injection, Pharmacologic Agent - OD - Right Eye  Result Date: 03/18/2022 Time Out 03/18/2022. 4:21 PM. Confirmed correct patient, procedure, site, and patient consented. Anesthesia Topical anesthesia was used. Anesthetic medications included Lidocaine 4%. Procedure Preparation included 5% betadine to ocular surface, 10% betadine to eyelids, Tobramycin 0.3%. A 30 gauge needle was used. Injection: 2 mg aflibercept 2 MG/0.05ML   Route: Intravitreal,  Site: Right Eye   NDC: [id number], Lot: [id number], Expiration date: 08/18/2022, Waste: 0 mL Post-op Post injection exam found visual acuity of at least counting fingers. The patient tolerated the procedure well. There were no complications. The patient received written and verbal post procedure care education. Post injection medications included ocuflox.   OCT, Retina - OU - Both Eyes  Result Date: 03/18/2022 Right Eye Quality was good. Scan locations included subfoveal. Central Foveal Thickness: 315. Progression has been stable. Findings include abnormal foveal contour, epiretinal membrane. Left Eye Quality was good. Scan locations included subfoveal. Central Foveal Thickness: 303. Progression has improved. Findings include abnormal foveal contour. Notes OS today at 6 weeks post Eylea.  CSME remains but improved at 6-week interval post Eylea and now after recent injection we will continue to follow left eye as scheduled OD stable, improved CSME and maintained today at 6-week interval.  Need injection today at 6 weeks as no recurrence of CSME has developed.  Repeat evaluation of right again in 6 weeks   Intravitreal Injection, Pharmacologic Agent - OS - Left Eye  Result Date: 03/13/2022 Time Out 03/13/2022. 4:39 PM. Confirmed correct patient, procedure, site, and patient consented. Anesthesia Topical anesthesia was used. Anesthetic medications included Lidocaine 4%. Procedure Preparation included 5% betadine to ocular surface, 10% betadine to eyelids, Tobramycin 0.3%. A 30 gauge needle was used. Injection: 2 mg aflibercept 2 MG/0.05ML   Route: Intravitreal, Site: Left Eye   NDC: [id number], Lot: [id number], Expiration date: 04/20/2023, Waste: 0 mL Post-op Post injection exam found visual acuity of at least counting fingers. The patient tolerated the procedure well. There were no complications. The patient received written and verbal post procedure care education. Post injection medications included ocuflox.    OCT, Retina - OU - Both Eyes  Result Date: 03/13/2022 Right Eye Quality was good. Scan locations included subfoveal. Central Foveal Thickness: 321. Progression has been stable. Findings include abnormal foveal contour, epiretinal membrane. Left Eye Quality was good. Scan locations included subfoveal. Central Foveal Thickness: 302. Progression has improved. Findings include abnormal foveal contour. Notes OS today at 6 weeks post Eylea.  CSME remains but improved at 6-week interval post Eylea repeat injection today OD stable, improved CSME and maintained today at 5-week interval.  Follow-up OD as scheduled    The results of significant diagnostics from this hospitalization (including imaging, microbiology, ancillary and laboratory) are listed below for reference.     Microbiology: Recent Results (from the past 240 hour(s))  Urine Culture     Status: Abnormal   Collection Time: 03/18/22 10:09 AM   Specimen: Urine  Result Value Ref Range Status   MICRO NUMBER: [id number]  Final   SPECIMEN QUALITY: Adequate  Final   Sample Source URINE  Final   STATUS:  FINAL  Final   ISOLATE 1: Escherichia coli (A)  Final    Comment: Greater than 100,000 CFU/mL of Escherichia coli      Susceptibility   Escherichia coli - URINE CULTURE, REFLEX    AMOX/CLAVULANIC >=32 Resistant     AMPICILLIN >=32 Resistant     AMPICILLIN/SULBACTAM >=32 Resistant     CEFAZOLIN* 8 Resistant      * For uncomplicated UTI caused by E. coli, K. pneumoniae or P. mirabilis: Cefazolin is susceptible if MIC <32 mcg/mL and predicts susceptible to the oral agents cefaclor, cefdinir, cefpodoxime, cefprozil, cefuroxime, cephalexin and loracarbef.     CEFTAZIDIME <=1 Sensitive     CEFEPIME <=1 Sensitive     CEFTRIAXONE <=1 Sensitive     CIPROFLOXACIN <=0.25 Sensitive     LEVOFLOXACIN <=0.12 Sensitive     GENTAMICIN <=1 Sensitive     IMIPENEM <=0.25 Sensitive     NITROFURANTOIN <=16 Sensitive     PIP/TAZO <=4 Sensitive      TOBRAMYCIN <=1 Sensitive     TRIMETH/SULFA* <=20 Sensitive      * For uncomplicated UTI caused by E. coli, K. pneumoniae or P. mirabilis: Cefazolin is susceptible if MIC <32 mcg/mL and predicts susceptible to the oral agents cefaclor, cefdinir, cefpodoxime, cefprozil, cefuroxime, cephalexin and loracarbef. Legend: S = Susceptible  I = Intermediate R = Resistant  NS = Not susceptible * = Not tested  NR = Not reported **NN = See antimicrobic comments   MICROSCOPIC MESSAGE     Status: None   Collection Time: 03/18/22 10:09 AM  Result Value Ref Range Status   Note   Final    Comment: This urine was analyzed for the presence of WBC,  RBC, bacteria, casts, and other formed elements.  Only those elements seen were reported. . .   Urine Culture     Status: None   Collection Time: 03/24/22  7:07 PM   Specimen: Urine, Clean Catch  Result Value Ref Range Status   Specimen Description URINE, CLEAN CATCH  Final   Special Requests NONE  Final   Culture   Final    NO GROWTH Performed at [facility], [address] [place], [place] [address]    Report Status 03/26/2022 FINAL  Final  Resp Panel by RT-PCR (Flu A&B, Covid) Peripheral     Status: None   Collection Time: 03/24/22  7:07 PM   Specimen: Peripheral; Nasal Swab  Result Value Ref Range Status   SARS Coronavirus 2 by RT PCR NEGATIVE NEGATIVE Final    Comment: (NOTE) SARS-CoV-2 target nucleic acids are NOT DETECTED.  The SARS-CoV-2 RNA is generally detectable in upper respiratory specimens during the acute phase of infection. The lowest concentration of SARS-CoV-2 viral copies this assay can detect is 138 copies/mL. A negative result does not preclude SARS-Cov-2 infection and should not be used as the sole basis for treatment or other patient management decisions. A negative result may occur with  improper specimen collection/handling, submission of specimen other than nasopharyngeal swab, presence of viral mutation(s)  within the areas targeted by this assay, and inadequate number of viral copies(<138 copies/mL). A negative result must be combined with clinical observations, patient history, and epidemiological information. The expected result is Negative.  Fact Sheet for Patients:  EntrepreneurPulse.com.au  Fact Sheet for Healthcare Providers:  IncredibleEmployment.be  This test is no t yet approved or cleared by the [place] FDA and  has been authorized for detection and/or diagnosis of SARS-CoV-2 by  FDA under an Emergency Use Authorization (EUA). This EUA will remain  in effect (meaning this test can be used) for the duration of the COVID-19 declaration under Section 564(b)(1) of the Act, 21 U.S.C.section 360bbb-3(b)(1), unless the authorization is terminated  or revoked sooner.       Influenza A by PCR NEGATIVE NEGATIVE Final   Influenza B by PCR NEGATIVE NEGATIVE Final    Comment: (NOTE) The Xpert Xpress SARS-CoV-2/FLU/RSV plus assay is intended as an aid in the diagnosis of influenza from Nasopharyngeal swab specimens and should not be used as a sole basis for treatment. Nasal washings and aspirates are unacceptable for Xpert Xpress SARS-CoV-2/FLU/RSV testing.  Fact Sheet for Patients: EntrepreneurPulse.com.au  Fact Sheet for Healthcare Providers: IncredibleEmployment.be  This test is not yet approved or cleared by the [place] FDA and has been authorized for detection and/or diagnosis of SARS-CoV-2 by FDA under an Emergency Use Authorization (EUA). This EUA will remain in effect (meaning this test can be used) for the duration of the COVID-19 declaration under Section 564(b)(1) of the Act, 21 U.S.C. section 360bbb-3(b)(1), unless the authorization is terminated or revoked.  Performed at [facility], [place] [address] [place], [place] [address]   Blood culture (routine x 2)     Status: None  (Preliminary result)   Collection Time: 03/24/22  8:05 PM   Specimen: BLOOD  Result Value Ref Range Status   Specimen Description BLOOD RIGHT ANTECUBITAL  Final   Special Requests   Final    BOTTLES DRAWN AEROBIC AND ANAEROBIC Blood Culture results may not be optimal due to an excessive volume of blood received in culture bottles   Culture   Final    NO GROWTH 3 DAYS Performed at [facility], [place] [address] [place], [place] [address]    Report Status PENDING  Incomplete  Blood culture (routine x 2)     Status: None (Preliminary result)   Collection Time: 03/25/22  9:10 AM   Specimen: BLOOD  Result Value Ref Range Status   Specimen Description BLOOD BLOOD RIGHT FOREARM  Final   Special Requests   Final    BOTTLES DRAWN AEROBIC ONLY Blood Culture results may not be optimal due to an inadequate volume of blood received in culture bottles   Culture   Final    NO GROWTH 2 DAYS Performed at [facility], [place] [address], [place], [place] [address]    Report Status PENDING  Incomplete     Labs: BNP (last 3 results) No results for input(s): "BNP" in the last 8760 hours. Basic Metabolic Panel: Recent Labs  Lab 03/24/22 2143 03/25/22 0609 03/26/22 0529 03/27/22 0342  NA 131* 135 138 137  K 4.5 3.9 3.3* 3.9  CL 93* 97* 102 104  CO2 '28 26 24 25  '$ GLUCOSE 324* 249* 120* 192*  BUN '20 16 17 17  '$ CREATININE 1.38* 1.08* 0.99 0.96  CALCIUM 9.0 8.4* 8.4* 8.6*  MG  --   --   --  1.5*   Liver Function Tests: Recent Labs  Lab 03/24/22 2143 03/25/22 0609  AST 48* 37  ALT 45* 40  ALKPHOS 54 48  BILITOT 0.2* 0.5  PROT 6.1* 5.3*  ALBUMIN 3.0* 2.6*   No results for input(s): "LIPASE", "AMYLASE" in the last 168 hours. No results for input(s): "AMMONIA" in the last 168 hours. CBC: Recent Labs  Lab 03/24/22 1307 03/25/22 0609 03/26/22 0529 03/27/22 0342  WBC 10.0 8.0 7.4 7.5  NEUTROABS  --  6.0  --   --   HGB 13.3 11.9* 11.3* 11.0*  HCT 40.1 35.5* 33.8* 32.8*  MCV  92.8 91.5 92.1 91.1  PLT 347 280 212 285   Cardiac Enzymes: Recent Labs  Lab 03/25/22 0609  CKTOTAL 110   BNP: Invalid input(s): "POCBNP" CBG: Recent Labs  Lab 03/26/22 0803 03/26/22 1716 03/26/22 2021 03/27/22 0814 03/27/22 1200  GLUCAP 106* 293* 257* 145* 297*   D-Dimer No results for input(s): "DDIMER" in the last 72 hours. Hgb A1c Recent Labs    03/25/22 0645  HGBA1C 7.8*   Lipid Profile No results for input(s): "CHOL", "HDL", "LDLCALC", "TRIG", "CHOLHDL", "LDLDIRECT" in the last 72 hours. Thyroid function studies Recent Labs    03/25/22 0645  TSH 1.761   Anemia work up No results for input(s): "VITAMINB12", "FOLATE", "FERRITIN", "TIBC", "IRON", "RETICCTPCT" in the last 72 hours. Urinalysis    Component Value Date/Time   COLORURINE YELLOW 03/24/2022 2107   APPEARANCEUR HAZY (A) 03/24/2022 2107   LABSPEC 1.031 (H) 03/24/2022 2107   PHURINE 5.0 03/24/2022 2107   GLUCOSEU >=500 (A) 03/24/2022 2107   HGBUR NEGATIVE 03/24/2022 2107   BILIRUBINUR NEGATIVE 03/24/2022 2107   KETONESUR 20 (A) 03/24/2022 2107   PROTEINUR 100 (A) 03/24/2022 2107   NITRITE NEGATIVE 03/24/2022 2107   LEUKOCYTESUR NEGATIVE 03/24/2022 2107   Sepsis Labs Recent Labs  Lab 03/24/22 1307 03/25/22 0609 03/26/22 0529 03/27/22 0342  WBC 10.0 8.0 7.4 7.5   Microbiology Recent Results (from the past 240 hour(s))  Urine Culture     Status: Abnormal   Collection Time: 03/18/22 10:09 AM   Specimen: Urine  Result Value Ref Range Status   MICRO NUMBER: [id number]  Final   SPECIMEN QUALITY: Adequate  Final   Sample Source URINE  Final   STATUS: FINAL  Final   ISOLATE 1: Escherichia coli (A)  Final    Comment: Greater than 100,000 CFU/mL of Escherichia coli      Susceptibility   Escherichia coli - URINE CULTURE, REFLEX    AMOX/CLAVULANIC >=32 Resistant     AMPICILLIN >=32 Resistant     AMPICILLIN/SULBACTAM >=32 Resistant     CEFAZOLIN* 8 Resistant      * For uncomplicated UTI  caused by E. coli, K. pneumoniae or P. mirabilis: Cefazolin is susceptible if MIC <32 mcg/mL and predicts susceptible to the oral agents cefaclor, cefdinir, cefpodoxime, cefprozil, cefuroxime, cephalexin and loracarbef.     CEFTAZIDIME <=1 Sensitive     CEFEPIME <=1 Sensitive     CEFTRIAXONE <=1 Sensitive     CIPROFLOXACIN <=0.25 Sensitive     LEVOFLOXACIN <=0.12 Sensitive     GENTAMICIN <=1 Sensitive     IMIPENEM <=0.25 Sensitive     NITROFURANTOIN <=16 Sensitive     PIP/TAZO <=4 Sensitive     TOBRAMYCIN <=1 Sensitive     TRIMETH/SULFA* <=20 Sensitive      * For uncomplicated UTI caused by E. coli, K. pneumoniae or P. mirabilis: Cefazolin is susceptible if MIC <32 mcg/mL and predicts susceptible to the oral agents cefaclor, cefdinir, cefpodoxime, cefprozil, cefuroxime, cephalexin and loracarbef. Legend: S = Susceptible  I = Intermediate R = Resistant  NS = Not susceptible * = Not tested  NR = Not reported **NN = See antimicrobic comments   MICROSCOPIC MESSAGE     Status: None   Collection Time: 03/18/22 10:09 AM  Result Value Ref Range Status   Note   Final    Comment: This  urine was analyzed for the presence of WBC,  RBC, bacteria, casts, and other formed elements.  Only those elements seen were reported. . .   Urine Culture     Status: None   Collection Time: 03/24/22  7:07 PM   Specimen: Urine, Clean Catch  Result Value Ref Range Status   Specimen Description URINE, CLEAN CATCH  Final   Special Requests NONE  Final   Culture   Final    NO GROWTH Performed at [facility], [address], [place], [place] [address]    Report Status 03/26/2022 FINAL  Final  Resp Panel by RT-PCR (Flu A&B, Covid) Peripheral     Status: None   Collection Time: 03/24/22  7:07 PM   Specimen: Peripheral; Nasal Swab  Result Value Ref Range Status   SARS Coronavirus 2 by RT PCR NEGATIVE NEGATIVE Final    Comment: (NOTE) SARS-CoV-2 target nucleic acids are NOT DETECTED.  The  SARS-CoV-2 RNA is generally detectable in upper respiratory specimens during the acute phase of infection. The lowest concentration of SARS-CoV-2 viral copies this assay can detect is 138 copies/mL. A negative result does not preclude SARS-Cov-2 infection and should not be used as the sole basis for treatment or other patient management decisions. A negative result may occur with  improper specimen collection/handling, submission of specimen other than nasopharyngeal swab, presence of viral mutation(s) within the areas targeted by this assay, and inadequate number of viral copies(<138 copies/mL). A negative result must be combined with clinical observations, patient history, and epidemiological information. The expected result is Negative.  Fact Sheet for Patients:  EntrepreneurPulse.com.au  Fact Sheet for Healthcare Providers:  IncredibleEmployment.be  This test is no t yet approved or cleared by the [place] FDA and  has been authorized for detection and/or diagnosis of SARS-CoV-2 by FDA under an Emergency Use Authorization (EUA). This EUA will remain  in effect (meaning this test can be used) for the duration of the COVID-19 declaration under Section 564(b)(1) of the Act, 21 U.S.C.section 360bbb-3(b)(1), unless the authorization is terminated  or revoked sooner.       Influenza A by PCR NEGATIVE NEGATIVE Final   Influenza B by PCR NEGATIVE NEGATIVE Final    Comment: (NOTE) The Xpert Xpress SARS-CoV-2/FLU/RSV plus assay is intended as an aid in the diagnosis of influenza from Nasopharyngeal swab specimens and should not be used as a sole basis for treatment. Nasal washings and aspirates are unacceptable for Xpert Xpress SARS-CoV-2/FLU/RSV testing.  Fact Sheet for Patients: EntrepreneurPulse.com.au  Fact Sheet for Healthcare Providers: IncredibleEmployment.be  This test is not yet approved or  cleared by the [place] FDA and has been authorized for detection and/or diagnosis of SARS-CoV-2 by FDA under an Emergency Use Authorization (EUA). This EUA will remain in effect (meaning this test can be used) for the duration of the COVID-19 declaration under Section 564(b)(1) of the Act, 21 U.S.C. section 360bbb-3(b)(1), unless the authorization is terminated or revoked.  Performed at [facility], [place] [address], [place], [place] [address]   Blood culture (routine x 2)     Status: None (Preliminary result)   Collection Time: 03/24/22  8:05 PM   Specimen: BLOOD  Result Value Ref Range Status   Specimen Description BLOOD RIGHT ANTECUBITAL  Final   Special Requests   Final    BOTTLES DRAWN AEROBIC AND ANAEROBIC Blood Culture results may not be optimal due to an excessive volume of blood received in culture bottles  Culture   Final    NO GROWTH 3 DAYS Performed at [facility], [place] [address], [place], [place] [address]    Report Status PENDING  Incomplete  Blood culture (routine x 2)     Status: None (Preliminary result)   Collection Time: 03/25/22  9:10 AM   Specimen: BLOOD  Result Value Ref Range Status   Specimen Description BLOOD BLOOD RIGHT FOREARM  Final   Special Requests   Final    BOTTLES DRAWN AEROBIC ONLY Blood Culture results may not be optimal due to an inadequate volume of blood received in culture bottles   Culture   Final    NO GROWTH 2 DAYS Performed at [facility], [place] [address], [place], [place] [address]    Report Status PENDING  Incomplete     Time coordinating discharge:  I have spent 35 minutes face to face with the patient and on the ward discussing the patients care, assessment, plan and disposition with other care givers. >50% of the time was devoted counseling the patient about the risks and benefits of treatment/Discharge disposition and coordinating care.   SIGNED:   Damita Lack, MD  [facility] 03/27/2022, 1:30 PM   If 7PM-7AM, please contact night-coverage

## 2022-03-27 NOTE — Progress Notes (Signed)
[facility] night cross cover note:   I was notified by RN that the patient is experiencing some nausea.   I reviewed most recent EKG from 03/24/2022, which reflected QTc of 416 ms, and subsequently placed order for prn IV Zofran for nausea.     Babs Bertin, DO Hospitalist

## 2022-03-27 NOTE — TOC Transition Note (Signed)
Transition of Care [facility]) - CM/SW Discharge Note   Patient Details  Name: Grace [name] MRN: [id number] Date of Birth: 1950-12-15  [facility] CM/SW Contact:  Cyndi Bender, RN Phone Number: 03/27/2022, 12:01 PM   Clinical Narrative:     Patient is stable for discharge.Orders for home health and DME.  Patient defers to [facility] to find highly rated home health agency. Tommi Rumps with [facility] accepted referral for PT/OT. Patient agreeable to use in house provider for DME. Spoke to [place] with [employer] and rolator will be delivered to the home. Husband at bedside to transport home. No other TOC needs at this time. Address, Phone number and PCP verified.   Final next level of care: [place] Barriers to Discharge: Barriers Resolved   Patient Goals and CMS Choice Patient states their goals for this hospitalization and ongoing recovery are:: return home CMS Medicare.gov Compare Post Acute Care list provided to:: Patient Choice offered to / list presented to : Patient  Discharge Placement             home          Discharge Plan and Services   Discharge Planning Services: CM Consult Post Acute Care Choice: Home Health, Durable Medical Equipment          DME Arranged: Walker rolling with seat DME Agency: [employer] Date DME Agency Contacted: 03/27/22 Time DME Agency Contacted: [id number] Representative spoke with at DME Agency: Annie Sable HH Arranged: PT, OT [place] Agency: [place] Date [place]: 03/27/22 Time [place]: 1159 Representative spoke with at [place]: Carm[place] ([place]) Interventions     Readmission Risk Interventions    03/27/2022   11:59 AM  Readmission Risk Prevention Plan  Post Dischage Appt Complete  Medication Screening Complete  Transportation Screening Complete

## 2022-03-27 NOTE — Discharge Instructions (Signed)
Needs to follow up with outpatient Dentist.

## 2022-03-28 ENCOUNTER — Telehealth: Payer: Self-pay | Admitting: Nurse Practitioner

## 2022-03-28 NOTE — Telephone Encounter (Signed)
Insurance will not allow her to test her blood sugar 5 times a day. Will you revise her glucometer script to say that she is to test 3 times a day so that she can get it filled and picked up?

## 2022-03-29 LAB — CULTURE, BLOOD (ROUTINE X 2): Culture: NO GROWTH

## 2022-03-30 LAB — CULTURE, BLOOD (ROUTINE X 2): Culture: NO GROWTH

## 2022-03-31 ENCOUNTER — Other Ambulatory Visit: Payer: Self-pay

## 2022-03-31 DIAGNOSIS — E1151 Type 2 diabetes mellitus with diabetic peripheral angiopathy without gangrene: Secondary | ICD-10-CM

## 2022-03-31 MED ORDER — BLOOD GLUCOSE MONITOR KIT: PACK | 0 refills | Status: AC

## 2022-04-01 ENCOUNTER — Ambulatory Visit: Payer: Medicare Other | Admitting: Nurse Practitioner

## 2022-04-03 ENCOUNTER — Ambulatory Visit (INDEPENDENT_AMBULATORY_CARE_PROVIDER_SITE_OTHER): Payer: Medicare Other | Admitting: Nurse Practitioner

## 2022-04-03 ENCOUNTER — Encounter: Payer: Self-pay | Admitting: Nurse Practitioner

## 2022-04-03 ENCOUNTER — Other Ambulatory Visit: Payer: Self-pay | Admitting: Internal Medicine

## 2022-04-03 VITALS — BP 130/78 | HR 78 | Temp 97.9°F | Resp 17 | Ht 64.0 in | Wt 164.0 lb

## 2022-04-03 DIAGNOSIS — E08621 Diabetes mellitus due to underlying condition with foot ulcer: Secondary | ICD-10-CM | POA: Diagnosis not present

## 2022-04-03 DIAGNOSIS — E1151 Type 2 diabetes mellitus with diabetic peripheral angiopathy without gangrene: Secondary | ICD-10-CM | POA: Diagnosis not present

## 2022-04-03 DIAGNOSIS — K029 Dental caries, unspecified: Secondary | ICD-10-CM

## 2022-04-03 DIAGNOSIS — N3000 Acute cystitis without hematuria: Secondary | ICD-10-CM

## 2022-04-03 DIAGNOSIS — L03116 Cellulitis of left lower limb: Secondary | ICD-10-CM | POA: Diagnosis not present

## 2022-04-03 DIAGNOSIS — L97521 Non-pressure chronic ulcer of other part of left foot limited to breakdown of skin: Secondary | ICD-10-CM

## 2022-04-03 DIAGNOSIS — Z09 Encounter for follow-up examination after completed treatment for conditions other than malignant neoplasm: Secondary | ICD-10-CM

## 2022-04-03 DIAGNOSIS — A419 Sepsis, unspecified organism: Secondary | ICD-10-CM | POA: Diagnosis not present

## 2022-04-03 DIAGNOSIS — N182 Chronic kidney disease, stage 2 (mild): Secondary | ICD-10-CM | POA: Diagnosis not present

## 2022-04-03 NOTE — Progress Notes (Signed)
Hospital follow up  Assessment and Plan: Hospital visit follow up for:   1. Hospital discharge follow-up Recommendations for Outpatient Follow-up to be completed.   2. Diabetes mellitus type 2 with peripheral artery disease ([place]) Discussed importance of BG control to help decrease r/f infection.   Continue Metformin, Glimepiride Education: Reviewed 'ABCs' of diabetes management  Discussed goals to be met and/or maintained include A1C (<7) Blood pressure (<130/80) Cholesterol (LDL <70)  - COMPLETE METABOLIC PANEL WITH GFR  3. CKD (chronic kidney disease) stage 2, GFR 60-89 ml/min Discussed how what you eat and drink can aide in kidney protection. Stay well hydrated. Avoid high salt foods. Avoid NSAIDS. Keep BP and BG well controlled.   Take medications as prescribed. Remain active and exercise as tolerated daily. Maintain weight.  Continue to monitor.   - COMPLETE METABOLIC PANEL WITH GFR  4. Sepsis without acute organ dysfunction, due to unspecified organism ([place]) Continue to monitor for increase in fever, chills, N/V, abdominal pain.  - CBC with Differential/Platelet - COMPLETE METABOLIC PANEL WITH GFR - Urinalysis w microscopic + reflex cultur  5. Cellulitis of left lower extremity Refer to Wound Management  - CBC with Differential/Platelet - Ambulatory referral to Wound Clinic  6. Diabetic ulcer of toe of left foot associated with diabetes mellitus due to underlying condition, limited to breakdown of skin ([place]) Continue to monitor for increase in spreading of redness, streaking, warmth, N/V, fever, chills.   - Ambulatory referral to Wound Clinic  7. Acute cystitis without hematuria Evaluate urine Stay well hydrated Consider cranberry supplement  8. Dental decay Follow up with outpatient dentist.   Home Health has been set up via hospital discharge - they will be contacting you.  Orders Placed This Encounter  Procedures   CBC with Differential/Platelet    COMPLETE METABOLIC PANEL WITH GFR   Urinalysis w microscopic + reflex cultur   REFLEXIVE URINE CULTURE   Ambulatory referral to Wound Clinic    Referral Priority:   Routine    Referral Type:   Consultation    Referral Reason:   Specialty Services Required    Requested Specialty:   Wound Care    Number of Visits Requested:   1    All medications were reviewed with patient and family and fully reconciled. All questions answered fully, and patient and family members were encouraged to call the office with any further questions or concerns. Discussed goal to avoid readmission related to this diagnosis.   There are no discontinued medications.  Over 40 minutes of exam, counseling, chart review, and complex, high/moderate level critical decision making was performed this visit.   Future Appointments  Date Time Provider [place]  04/24/2022  3:30 PM Rankin, Clent Demark, MD [facility] None  04/29/2022  3:30 PM Rankin, Clent Demark, MD [facility] None  06/11/2022 10:00 AM Alycia Rossetti, NP [facility] None  06/26/2022 11:00 AM Alycia Rossetti, NP [facility] None  01/22/2023  8:45 AM Suzzanne Cloud, NP [facility] None     HPI 71 y.o.female presents for follow up for transition from recent hospitalization stay. Admit date to the hospital was 03/24/22, patient was discharged from the hospital on 03/27/22 and our clinical staff contacted the office the day after discharge to set up a follow up appointment. The discharge summary, medications, and diagnostic test results were reviewed before meeting with the patient. The patient was admitted for:   history of diabetes mellitus type 2, bilateral foot drop secondary to diabetic neuropathy was  recently treated for urinary tract infection cultures grew E. coli presented to the ER patient d/t feeling feeling increasingly weak nauseous unable to eat anything.  She reported a blister on the left foot and had gone to wound team but felt as though it was not improving.      In the ER patient was found to have a temperature of 102 F.  Lactic acid was elevated.  Patient was given fluid bolus and admitted for possible sepsis noting source could be left foot cellulitis.  X-rays of the left foot does did not show any bone involvement.  Chest x-ray COVID test and UA were unremarkable during that time.  She presents today with continued complaints of dysuria and urinary frequency.  She states that she is unsure if she had an indwelling catheter or urinary suction purwick.   Home health is involved. She has not made arrangements for outpatient therapy at this time.   She was not discharged with any equipment/devices.  Her discharge condition was stable.  She was asked to continue a diabetic diet.   She was also noted to have dental decay that could have contributed to the sepsis.  She is continuing outpatient abx therapy.  Images while in the hospital: US Abdomen Limited RUQ (LIVER/GB)  Result Date: 03/25/2022 CLINICAL DATA:  Pain EXAM: ULTRASOUND ABDOMEN LIMITED RIGHT UPPER QUADRANT COMPARISON:  03/25/2022 FINDINGS: Gallbladder: Mobile shadowing gallstone measures up to 2 cm. No gallbladder wall thickening or pericholecystic fluid. Negative sonographic Murphy sign. Common bile duct: Diameter: 5 mm Liver: No focal lesion identified. Within normal limits in parenchymal echogenicity. Portal vein is patent on color Doppler imaging with normal direction of blood flow towards the liver. Other: Incidental simple 2.2 cm right renal cyst does not require imaging follow-up. IMPRESSION: 1. Cholelithiasis without acute cholecystitis. Electronically Signed   By: Randa Ngo M.D.   On: 03/25/2022 22:44   MR FOOT LEFT WO CONTRAST  Result Date: 03/25/2022 CLINICAL DATA:  Diabetic foot wound EXAM: MRI OF THE LEFT FOOT WITHOUT CONTRAST TECHNIQUE: Multiplanar, multisequence MR imaging of the left forefoot was performed. No intravenous contrast was administered. COMPARISON:  X-ray  03/25/2022 FINDINGS: Technical Note: Despite efforts by the technologist and patient, motion artifact is present on today's exam and could not be eliminated. This reduces exam sensitivity and specificity. Bones/Joint/Cartilage Motion degraded images without evidence of acute fracture or dislocation. No definite site of bone marrow edema or marrow replacement. No discernible erosions. No large joint effusion. Ligaments Intact Lisfranc ligament. No evidence of collateral ligament disruption. Muscles and Tendons Denervation changes of the intrinsic foot musculature. Grossly intact flexor and extensor tendons. No large tenosynovial fluid collection. Soft tissues Subcutaneous edema, most pronounced over the dorsal aspect of the forefoot. No deep soft tissue ulceration. No organized fluid collection is seen. IMPRESSION: 1. Motion degraded images. 2. No evidence of acute osteomyelitis within the limitations of this exam. 3. Subcutaneous edema, most pronounced over the dorsal aspect of the forefoot. No deep soft tissue ulceration. No organized fluid collection. Electronically Signed   By: Davina Poke D.O.   On: 03/25/2022 08:40   CT RENAL STONE STUDY  Result Date: 03/25/2022 CLINICAL DATA:  Flank pain.  Urinary tract infection. EXAM: CT ABDOMEN AND PELVIS WITHOUT CONTRAST TECHNIQUE: Multidetector CT imaging of the abdomen and pelvis was performed following the standard protocol without IV contrast. RADIATION DOSE REDUCTION: This exam was performed according to the departmental dose-optimization program which includes automated exposure control, adjustment of the mA  and/or kV according to patient size and/or use of iterative reconstruction technique. COMPARISON:  Abdomen pelvis CT 03/24/2022 FINDINGS: Lower chest: Unremarkable. Hepatobiliary: No suspicious focal abnormality in the liver on this study without intravenous contrast. Tumefactive sludge versus noncalcified gallstone (image 26/series 3). No intrahepatic or  extrahepatic biliary dilation. Pancreas: No focal mass lesion. No dilatation of the main duct. No intraparenchymal cyst. No peripancreatic edema. Spleen: No splenomegaly. No focal mass lesion. Adrenals/Urinary Tract: No adrenal nodule or mass. Small simple right renal cyst again noted. No followup recommended. Both kidneys are excreting contrast from yesterday's CT scan which would obscure collecting system stones although no stones are visible on yesterday's study. No evidence for hydroureter. The urinary bladder appears normal for the degree of distention. Stomach/Bowel: Tiny hiatal hernia. Stomach otherwise unremarkable. Duodenum is normally positioned as is the ligament of Treitz. No small bowel wall thickening. No small bowel dilatation. The terminal ileum is normal. The appendix is normal. No gross colonic mass. No colonic wall thickening. Vascular/Lymphatic: There is moderate atherosclerotic calcification of the abdominal aorta without aneurysm. There is no gastrohepatic or hepatoduodenal ligament lymphadenopathy. No retroperitoneal or mesenteric lymphadenopathy. No pelvic sidewall lymphadenopathy. Reproductive: The uterus is surgically absent. There is no adnexal mass. Other: No intraperitoneal free fluid. Musculoskeletal: Small bilateral groin hernias contain only fat. Status post right hip replacement. No worrisome lytic or sclerotic osseous abnormality. IMPRESSION: 1. No acute findings in the abdomen or pelvis. Specifically, no findings to explain the patient's history of flank pain. 2. Tumefactive sludge versus noncalcified gallstone. 3. Small bilateral groin hernias contain only fat. 4. Aortic Atherosclerosis (ICD10-I70.0). Electronically Signed   By: Misty Stanley M.D.   On: 03/25/2022 05:12   DG Foot 2 Views Left  Result Date: 03/25/2022 CLINICAL DATA:  Left foot wound EXAM: LEFT FOOT - 2 VIEW COMPARISON:  None Available. FINDINGS: Osseous structures are mildly osteopenic. No acute fracture or  dislocation. No erosions or abnormal periosteal reaction. Mild soft tissue swelling of the left forefoot. Small superior and plantar calcaneal spurs are present. Bimalleolar ORIF has been performed, not well assessed on this exam. IMPRESSION: Mild soft tissue swelling. No radiographic evidence of osteomyelitis. Electronically Signed   By: Fidela Salisbury M.D.   On: 03/25/2022 02:36   CT ABDOMEN PELVIS W CONTRAST  Result Date: 03/24/2022 CLINICAL DATA:  Right lower quadrant nausea and vomiting, abdominal pain EXAM: CT ABDOMEN AND PELVIS WITH CONTRAST TECHNIQUE: Multidetector CT imaging of the abdomen and pelvis was performed using the standard protocol following bolus administration of intravenous contrast. RADIATION DOSE REDUCTION: This exam was performed according to the departmental dose-optimization program which includes automated exposure control, adjustment of the mA and/or kV according to patient size and/or use of iterative reconstruction technique. CONTRAST:  13m OMNIPAQUE IOHEXOL 300 MG/ML  SOLN COMPARISON:  None Available. FINDINGS: Lower chest: No acute pleural or parenchymal lung disease. Hepatobiliary: No focal liver abnormality is seen. No gallstones, gallbladder wall thickening, or biliary dilatation. Pancreas: Unremarkable. No pancreatic ductal dilatation or surrounding inflammatory changes. Spleen: Normal in size without focal abnormality. Adrenals/Urinary Tract: Kidneys are grossly unremarkable with no evidence of focal solid lesion. Normal parenchymal enhancement. The adrenals and bladder are unremarkable. Stomach/Bowel: There are minimally distended loops of small bowel with scattered gas fluid levels, which may reflect an element of ileus or gastroenteritis. Moderate gas and stool throughout the colon. No evidence of high-grade obstruction. Normal appendix right lower quadrant. No bowel wall thickening or inflammatory change. Small hiatal hernia. Vascular/Lymphatic: Aortic atherosclerosis.  No enlarged abdominal or pelvic lymph nodes. Reproductive: Status post hysterectomy. No adnexal masses. Other: No free fluid or free intraperitoneal gas. Bilateral fat containing inguinal hernias. Musculoskeletal: Right hip arthroplasty. No acute or destructive bony lesions. Reconstructed images demonstrate no additional findings. IMPRESSION: 1. Mild small bowel distension with scattered gas fluid levels, which could reflect gastroenteritis and/or mild ileus. No evidence of high-grade bowel obstruction. 2. Small hiatal hernia. 3. Normal appendix. 4.  Aortic Atherosclerosis (ICD10-I70.0). Electronically Signed   By: Randa Ngo M.D.   On: 03/24/2022 22:52   DG Chest 2 View  Result Date: 03/24/2022 CLINICAL DATA:  Weakness.  Fever. EXAM: CHEST - 2 VIEW COMPARISON:  02/11/2011 FINDINGS: The heart size and mediastinal contours are within normal limits. Both lungs are clear. The visualized skeletal structures are unremarkable. IMPRESSION: No active cardiopulmonary disease. Electronically Signed   By: Nelson Chimes M.D.   On: 03/24/2022 19:39     Current Outpatient Medications (Endocrine & Metabolic):    glimepiride (AMARYL) 2 MG tablet, Take  1 tablet  2 x /day  with Meals  for Diabetes (Patient taking differently: Take 2 mg by mouth daily.)   metFORMIN (GLUCOPHAGE-XR) 500 MG 24 hr tablet, TAKE 2 TABLETS BY MOUTH TWICE DAILY FOR DIABETES (Patient taking differently: Take 1,000 mg by mouth 2 (two) times daily.)    Current Outpatient Medications (Analgesics):    acetaminophen (TYLENOL) 500 MG tablet, Take 1,500 mg by mouth every 8 (eight) hours as needed for moderate pain.   Current Outpatient Medications (Other):    amoxicillin-clavulanate (AUGMENTIN) 875-125 MG tablet, Take 1 tablet by mouth 2 (two) times daily for 8 days.   blood glucose meter kit and supplies KIT, Dispense based on patient and insurance preference. Use 3 times daily as directed.   Cholecalciferol (DIALYVITE VITAMIN D 5000) 125 MCG  (5000 UT) capsule, Take 5,000 Units by mouth daily.   DULoxetine (CYMBALTA) 30 MG capsule, Take 1 capsule (30 mg total) by mouth daily.   Magnesium 250 MG TABS, Take 500 mg by mouth 2 (two) times daily.   Omega-3 350 MG CPDR, Take 350 mg by mouth 2 (two) times daily.   zinc gluconate 50 MG tablet, Take 50 mg by mouth daily.  Past Medical History:  Diagnosis Date   Abnormality of gait 07/28/2013   Chronic kidney disease    Diabetes ([place])    Diabetic peripheral neuropathy ([facility])    Diabetic retinopathy ([place])    Difficult intubation    Foot drop, bilateral 07/31/2014   GERD (gastroesophageal reflux disease)    Peripheral edema    Polyneuropathy in diabetes(357.2) 07/28/2013   Sleep apnea    uses cpap   Tongue mass      Allergies  Allergen Reactions   Gabapentin Swelling    Swelling in legs and feet    ROS: all negative except above.   Physical Exam: Filed Weights   04/03/22 1428  Weight: 164 lb (74.4 kg)   BP 130/78   Pulse 78   Temp 97.9 F (36.6 C)   Resp 17   Ht 5' 4"  (1.626 m)   Wt 164 lb (74.4 kg)   SpO2 99%   BMI 28.15 kg/m  General Appearance: Well nourished, in no apparent distress. Eyes: PERRLA, EOMs, conjunctiva no swelling or erythema Sinuses: No Frontal/maxillary tenderness ENT/Mouth: Ext aud canals clear, TMs without erythema, bulging. No erythema, swelling, or exudate on post pharynx.  Tonsils not swollen or erythematous. Hearing normal.  Neck: Supple, thyroid normal.  Respiratory: Respiratory effort normal, BS equal bilaterally without rales, rhonchi, wheezing or stridor.  Cardio: RRR with no MRGs. Brisk peripheral pulses without edema.  Abdomen: Soft, + BS.  Non tender, no guarding, rebound, hernias, masses. Lymphatics: Non tender without lymphadenopathy.  Musculoskeletal: Full ROM, 5/5 strength, normal gait.  Skin: LLE with dressing intact.  Surrounding skin WNL.  Warm, dry without rashes, lesions, ecchymosis,spreading of redness. Neuro: Cranial  nerves intact. Normal muscle tone, no cerebellar symptoms. Sensation intact.  Psych: Awake and oriented X 3, normal affect, Insight and Judgment appropriate.     Darrol Jump, NP 2:33 PM [facility]

## 2022-04-03 NOTE — Patient Instructions (Signed)
Cellulitis, Adult  Cellulitis is a skin infection. The infected area is often warm, red, swollen, and sore. It occurs most often in the arms and lower legs. It is very important to get treated for this condition. What are the causes? This condition is caused by bacteria. The bacteria enter through a break in the skin, such as a cut, burn, insect bite, open sore, or crack. What increases the risk? This condition is more likely to occur in people who: Have a weak body defense system (immune system). Have open cuts, burns, bites, or scrapes on the skin. Are older than 72 years of age. Have a blood sugar problem (diabetes). Have a long-lasting (chronic) liver disease (cirrhosis) or kidney disease. Are very overweight (obese). Have a skin problem, such as: Itchy rash (eczema). Slow movement of blood in the veins (venous stasis). Fluid buildup below the skin (edema). Have been treated with high-energy rays (radiation). Use IV drugs. What are the signs or symptoms? Symptoms of this condition include: Skin that is: Red. Streaking. Spotting. Swollen. Sore or painful when you touch it. Warm. A fever. Chills. Blisters. How is this diagnosed? This condition is diagnosed based on: Medical history. Physical exam. Blood tests. Imaging tests. How is this treated? Treatment for this condition may include: Medicines to treat infections or allergies. Home care, such as: Rest. Placing cold or warm cloths (compresses) on the skin. Hospital care, if the condition is very bad. Follow these instructions at home: Medicines Take over-the-counter and prescription medicines only as told by your doctor. If you were prescribed an antibiotic medicine, take it as told by your doctor. Do not stop taking it even if you start to feel better. General instructions  Drink enough fluid to keep your pee (urine) pale yellow. Do not touch or rub the infected area. Raise (elevate) the infected area above  the level of your heart while you are sitting or lying down. Place cold or warm cloths on the area as told by your doctor. Keep all follow-up visits as told by your doctor. This is important. Contact a doctor if: You have a fever. You do not start to get better after 1-2 days of treatment. Your bone or joint under the infected area starts to hurt after the skin has healed. Your infection comes back. This can happen in the same area or another area. You have a swollen bump in the area. You have new symptoms. You feel ill and have muscle aches and pains. Get help right away if: Your symptoms get worse. You feel very sleepy. You throw up (vomit) or have watery poop (diarrhea) for a long time. You see red streaks coming from the area. Your red area gets larger. Your red area turns dark in color. These symptoms may represent a serious problem that is an emergency. Do not wait to see if the symptoms will go away. Get medical help right away. Call your local emergency services (911 in the [place] Do not drive yourself to the hospital. Summary Cellulitis is a skin infection. The area is often warm, red, swollen, and sore. This condition is treated with medicines, rest, and cold and warm cloths. Take all medicines only as told by your doctor. Tell your doctor if symptoms do not start to get better after 1-2 days of treatment. This information is not intended to replace advice given to you by your health care provider. Make sure you discuss any questions you have with your health care provider. Document Revised: 05/16/2021 Document   Reviewed: 05/16/2021 Elsevier Patient Education  2023 [employer]  

## 2022-04-04 LAB — URINALYSIS W MICROSCOPIC + REFLEX CULTURE
Bacteria, UA: NONE SEEN /HPF
Bilirubin Urine: NEGATIVE
Glucose, UA: NEGATIVE
Hgb urine dipstick: NEGATIVE
Hyaline Cast: NONE SEEN /LPF
Leukocyte Esterase: NEGATIVE
Nitrites, Initial: NEGATIVE
Specific Gravity, Urine: 1.023 (ref 1.001–1.035)
pH: <=5 (ref 5.0–8.0)

## 2022-04-04 LAB — COMPLETE METABOLIC PANEL WITH GFR
AG Ratio: 1.6 (calc) (ref 1.0–2.5)
ALT: 25 U/L (ref 6–29)
AST: 16 U/L (ref 10–35)
Albumin: 3.6 g/dL (ref 3.6–5.1)
Alkaline phosphatase (APISO): 53 U/L (ref 37–153)
BUN: 13 mg/dL (ref 7–25)
CO2: 33 mmol/L — ABNORMAL HIGH (ref 20–32)
Calcium: 10.5 mg/dL — ABNORMAL HIGH (ref 8.6–10.4)
Chloride: 102 mmol/L (ref 98–110)
Creat: 0.91 mg/dL (ref 0.60–1.00)
Globulin: 2.2 g/dL (calc) (ref 1.9–3.7)
Glucose, Bld: 93 mg/dL (ref 65–99)
Potassium: 5.2 mmol/L (ref 3.5–5.3)
Sodium: 144 mmol/L (ref 135–146)
Total Bilirubin: 0.3 mg/dL (ref 0.2–1.2)
Total Protein: 5.8 g/dL — ABNORMAL LOW (ref 6.1–8.1)
eGFR: 67 mL/min/{1.73_m2} (ref >=60)

## 2022-04-04 LAB — CBC WITH DIFFERENTIAL/PLATELET
Absolute Monocytes: 846 cells/uL (ref 200–950)
Basophils Absolute: 82 cells/uL (ref 0–200)
Basophils Relative: 0.9 %
Eosinophils Absolute: 865 cells/uL — ABNORMAL HIGH (ref 15–500)
Eosinophils Relative: 9.5 %
HCT: 35.3 % (ref 35.0–45.0)
Hemoglobin: 11.3 g/dL — ABNORMAL LOW (ref 11.7–15.5)
Lymphs Abs: 2330 cells/uL (ref 850–3900)
MCH: 30.6 pg (ref 27.0–33.0)
MCHC: 32 g/dL (ref 32.0–36.0)
MCV: 95.7 fL (ref 80.0–100.0)
MPV: 8.7 fL (ref 7.5–12.5)
Monocytes Relative: 9.3 %
Neutro Abs: 4978 cells/uL (ref 1500–7800)
Neutrophils Relative %: 54.7 %
Platelets: 586 10*3/uL — ABNORMAL HIGH (ref 140–400)
RBC: 3.69 10*6/uL — ABNORMAL LOW (ref 3.80–5.10)
RDW: 13.3 % (ref 11.0–15.0)
Total Lymphocyte: 25.6 %
WBC: 9.1 10*3/uL (ref 3.8–10.8)

## 2022-04-04 LAB — NO CULTURE INDICATED

## 2022-04-07 ENCOUNTER — Telehealth: Payer: Self-pay | Admitting: Nurse Practitioner

## 2022-04-07 NOTE — Telephone Encounter (Signed)
Pt is calling about her lab results from the 17th but they haven't been resulted yet.

## 2022-04-08 ENCOUNTER — Ambulatory Visit (INDEPENDENT_AMBULATORY_CARE_PROVIDER_SITE_OTHER): Payer: Medicare Other | Admitting: Nurse Practitioner

## 2022-04-08 VITALS — BP 118/70 | HR 69 | Temp 97.5°F | Ht 64.0 in | Wt 161.0 lb

## 2022-04-08 DIAGNOSIS — L03116 Cellulitis of left lower limb: Secondary | ICD-10-CM | POA: Diagnosis not present

## 2022-04-08 DIAGNOSIS — E1151 Type 2 diabetes mellitus with diabetic peripheral angiopathy without gangrene: Secondary | ICD-10-CM | POA: Diagnosis not present

## 2022-04-08 DIAGNOSIS — Z79899 Other long term (current) drug therapy: Secondary | ICD-10-CM | POA: Diagnosis not present

## 2022-04-08 DIAGNOSIS — B3731 Acute candidiasis of vulva and vagina: Secondary | ICD-10-CM | POA: Diagnosis not present

## 2022-04-08 DIAGNOSIS — N3000 Acute cystitis without hematuria: Secondary | ICD-10-CM

## 2022-04-08 DIAGNOSIS — N182 Chronic kidney disease, stage 2 (mild): Secondary | ICD-10-CM

## 2022-04-08 DIAGNOSIS — K029 Dental caries, unspecified: Secondary | ICD-10-CM | POA: Diagnosis not present

## 2022-04-08 DIAGNOSIS — A419 Sepsis, unspecified organism: Secondary | ICD-10-CM | POA: Diagnosis not present

## 2022-04-08 MED ORDER — FLUCONAZOLE 150 MG PO TABS: ORAL_TABLET | ORAL | 0 refills | Status: DC

## 2022-04-08 NOTE — Progress Notes (Signed)
Assessment and Plan:  Grace [name] was seen today for a follow up.  Diagnoses and all order for this visit:  1. Sepsis without acute organ dysfunction, due to unspecified organism [facility]) Will obtain updated CBC and Procalcitonin to confirm clearance of infection considering patient's continued complaints of pain.  - CBC with Differential/Platelet - Procalcitonin  2. Cellulitis of left lower extremity Continue scheduled apt with wound care. Continue to assess for increase in redness, N/V, chills. Notify office for any failure to improve.  - CBC with Differential/Platelet - Procalcitonin  3. Acute cystitis without hematuria Will obtain updated urine considering further complaints of pain.  - Urinalysis, Routine w reflex microscopic - Urine Culture  4. Dental decay Continue to obtained establishment with dentist. Continue to monitor for symptoms of infection.  - CBC with Differential/Platelet  5. Diabetes mellitus type 2 with peripheral artery disease (HCC) Keep BG well controlled to help aide in decrease of infection.  - CBC with Differential/Platelet - COMPLETE METABOLIC PANEL WITH GFR  6. CKD (chronic kidney disease) stage 2, GFR 60-89 ml/min Stay well hydrated. Avoid high salt foods. Avoid NSAIDS. Keep BP and BG well controlled.   Take medications as prescribed.  - COMPLETE METABOLIC PANEL WITH GFR  7. Serum calcium elevated  - COMPLETE METABOLIC PANEL WITH GFR  8. Medication management All medications discussed and reviewed in full. All questions and concerns regarding medications addressed.    - Urinalysis, Routine w reflex microscopic - Urine Culture - CBC with Differential/Platelet - COMPLETE METABOLIC PANEL WITH GFR - Procalcitonin  9.  Vaginal yeast infection Start Diflucan Continue probiotic and yogurt. Continue to monitor   Notify office for further evaluation and treatment, questions or concerns if s/s fail to improve. The risks and  benefits of my recommendations, as well as other treatment options were discussed with the patient today. Questions were answered.  Further disposition pending results of labs. Discussed med's effects and SE's.    Over 20 minutes of exam, counseling, chart review, and critical decision making was performed.   Future Appointments  Date Time Provider [place]  04/16/2022 10:30 AM Fredirick Maudlin, MD [facility]  04/24/2022  3:30 PM Rankin, Clent Demark, MD [facility] None  04/29/2022  3:30 PM Rankin, Clent Demark, MD RDE-RDE None  06/11/2022 10:00 AM Alycia Rossetti, NP [facility] None  06/26/2022 11:00 AM Alycia Rossetti, NP [facility] None  01/22/2023  8:45 AM Suzzanne Cloud, NP GNA[facility]GNA None    ------------------------------------------------------------------------------------------------------------------   HPI BP 118/70   Pulse 69   Temp (!) 97.5 F (36.4 C)   Ht 5' 4"  (1.626 m)   Wt 161 lb (73 kg)   SpO2 97%   BMI 27.64 kg/m   71 y.o.female presents for evaluation and management of continued dysuria, and lower abdominal pain, along with vaginal discharge.  She was recently discharged from hospital for sepsis secondary to left foot cellulitis and UTI. She has completed oral course of abx. She has been taking a probiotic and eating yogurt.  She has scheduled an apt with wound care and will follow up next week. LLE continues to remain erythematous with mild edema.  She was set up with [facility] after discharge but refused services.    She denies fever, chills, N/V.  Past Medical History:  Diagnosis Date   Abnormality of gait 07/28/2013   Chronic kidney disease    Diabetes ([place])    Diabetic peripheral neuropathy (HCC)    Diabetic retinopathy ([place])  Difficult intubation    Foot drop, bilateral 07/31/2014   GERD (gastroesophageal reflux disease)    Peripheral edema    Polyneuropathy in diabetes(357.2) 07/28/2013   Sleep apnea    uses cpap   Tongue mass       Allergies  Allergen Reactions   Gabapentin Swelling    Swelling in legs and feet    Current Outpatient Medications on File Prior to Visit  Medication Sig   acetaminophen (TYLENOL) 500 MG tablet Take 1,500 mg by mouth every 8 (eight) hours as needed for moderate pain.   blood glucose meter kit and supplies KIT Dispense based on patient and insurance preference. Use 3 times daily as directed.   Cholecalciferol (DIALYVITE VITAMIN D 5000) 125 MCG (5000 UT) capsule Take 5,000 Units by mouth daily.   DULoxetine (CYMBALTA) 30 MG capsule Take 1 capsule (30 mg total) by mouth daily.   glimepiride (AMARYL) 2 MG tablet TAKE 1 TABLET 2 X /DAY WITH MEALS FOR DIABETES   Magnesium 250 MG TABS Take 500 mg by mouth 2 (two) times daily.   metFORMIN (GLUCOPHAGE-XR) 500 MG 24 hr tablet TAKE 2 TABLETS BY MOUTH TWICE DAILY FOR DIABETES (Patient taking differently: Take 1,000 mg by mouth 2 (two) times daily.)   zinc gluconate 50 MG tablet Take 50 mg by mouth daily.   Omega-3 350 MG CPDR Take 350 mg by mouth 2 (two) times daily. (Patient not taking: Reported on 04/08/2022)   No current facility-administered medications on file prior to visit.    ROS: all negative except what is noted in the HPI.   Physical Exam:  BP 118/70   Pulse 69   Temp (!) 97.5 F (36.4 C)   Ht 5' 4"  (1.626 m)   Wt 161 lb (73 kg)   SpO2 97%   BMI 27.64 kg/m   General Appearance: NAD.  Awake, conversant and cooperative. Eyes: PERRLA, EOMs intact.  Sclera white.  Conjunctiva without erythema. Sinuses: No frontal/maxillary tenderness.  No nasal discharge. Nares patent.  ENT/Mouth: Ext aud canals clear.  Bilateral TMs w/DOL and without erythema or bulging. Hearing intact.  Posterior pharynx without swelling or exudate.  Tonsils without swelling or erythema.  Neck: Supple.  No masses, nodules or thyromegaly. Respiratory: Effort is regular with non-labored breathing. Breath sounds are equal bilaterally without rales, rhonchi,  wheezing or stridor.  Cardio: RRR with no MRGs. Brisk peripheral pulses without edema.  Abdomen: Active BS in all four quadrants.  Soft and non-tender without guarding, rebound tenderness, hernias or masses. Lymphatics: Non tender without lymphadenopathy.  Musculoskeletal: Full ROM, 5/5 strength, normal ambulation.  No clubbing or cyanosis. Skin: LLE with mild erythema and edema. No warmth.   Neuro: CN II-XII grossly normal. Normal muscle tone without cerebellar symptoms and intact sensation.   Psych: AO X 3,  appropriate mood and affect, insight and judgment.     Darrol Jump, NP 12:00 PM [facility]

## 2022-04-08 NOTE — Patient Instructions (Signed)
Probiotics Probiotics are the good bacteria and yeasts that live in your body and keep your digestive system healthy. Probiotics also help your body's defense system (immune system) and protect your body against the growth of harmful bacteria. Your health care provider may recommend taking a probiotic if you are taking antibiotic medicine or have certain medical conditions, such as: Diarrhea. Constipation. Irritable bowel syndrome. Lung infections. Yeast infections. Acne, eczema, and other skin conditions. Frequent urinary tract infections. What affects the balance of bacteria in my body? The balance of good bacteria in your body can be affected by: Antibiotics. These medicines treat infections caused by bacteria. Unfortunately, they may kill the good bacteria in your body as well as the bad bacteria. Certain medical conditions. Conditions related to an imbalance of bacteria include: Stomach and intestine (gastrointestinal) infections. Lung infections. Skin infections. Vaginal infections. Inflammatory bowel diseases. Stomach ulcers (gastric ulcers). Tooth decay and gum disease (periodontal disease). Stress. A low-fiber diet. What type of probiotic is right for me? Probiotics contain different types of bacteria (strains). Strains commonly found in probiotics include: Lactobacillus. Saccharomyces. Bifidobacterium. Specific strains have been shown to be more effective for certain health conditions. Ask your health care provider which strain or strains you should use and how often. Probiotics come in many different forms, strain combinations, and strengths. Some may need to be refrigerated. Always read the label for storage and usage instructions. Certain foods, such as yogurt, contain probiotics. Probiotics can also be bought as a supplement at a pharmacy, health food store, or grocery store. Talk to your health care provider before starting any supplement. What are the side effects of  probiotics? Some people have side effects when taking probiotics. Side effects are usually temporary and may include: Gas. Bloating. Cramping. Serious side effects are rare. Follow these instructions at home:  Eat foods high in fiber, such as whole grains, beans, and vegetables. These foods can help good bacteria grow. Avoid certain foods as told by your health care provider. If you are taking probiotics with antibiotic medicine, take your probiotics as told by your health care provider. You may have to take probiotics for several weeks. This is to help the growth of good bacteria in your gut. Where to find more information International Scientific Association for Probiotics and Prebiotics: isappscience.org Summary Probiotics are the good bacteria and yeasts that live in your body and keep you and your digestive system healthy. Certain foods, such as yogurt, contain probiotics. Probiotics can be taken as supplements. They can be bought at a pharmacy, health food store, or grocery store. They come in many different forms, strain combinations, and strengths. Be sure to talk with your health care provider before taking a probiotic supplement. This information is not intended to replace advice given to you by your health care provider. Make sure you discuss any questions you have with your health care provider. Document Revised: 05/22/2021 Document Reviewed: 05/22/2021 Elsevier Patient Education  2023 Elsevier Inc.  

## 2022-04-15 LAB — COMPLETE METABOLIC PANEL WITH GFR
AG Ratio: 1.5 (calc) (ref 1.0–2.5)
ALT: 14 U/L (ref 6–29)
AST: 16 U/L (ref 10–35)
Albumin: 3.7 g/dL (ref 3.6–5.1)
Alkaline phosphatase (APISO): 50 U/L (ref 37–153)
BUN: 15 mg/dL (ref 7–25)
CO2: 32 mmol/L (ref 20–32)
Calcium: 10.4 mg/dL (ref 8.6–10.4)
Chloride: 102 mmol/L (ref 98–110)
Creat: 0.97 mg/dL (ref 0.60–1.00)
Globulin: 2.5 g/dL (calc) (ref 1.9–3.7)
Glucose, Bld: 120 mg/dL — ABNORMAL HIGH (ref 65–99)
Potassium: 5.4 mmol/L — ABNORMAL HIGH (ref 3.5–5.3)
Sodium: 144 mmol/L (ref 135–146)
Total Bilirubin: 0.3 mg/dL (ref 0.2–1.2)
Total Protein: 6.2 g/dL (ref 6.1–8.1)
eGFR: 62 mL/min/{1.73_m2} (ref >=60)

## 2022-04-15 LAB — URINALYSIS, ROUTINE W REFLEX MICROSCOPIC
Bilirubin Urine: NEGATIVE
Glucose, UA: NEGATIVE
Hgb urine dipstick: NEGATIVE
Ketones, ur: NEGATIVE
Leukocytes,Ua: NEGATIVE
Nitrite: NEGATIVE
Protein, ur: NEGATIVE
Specific Gravity, Urine: 1.012 (ref 1.001–1.035)
pH: 6 (ref 5.0–8.0)

## 2022-04-15 LAB — CBC WITH DIFFERENTIAL/PLATELET
Absolute Monocytes: 538 cells/uL (ref 200–950)
Basophils Absolute: 78 cells/uL (ref 0–200)
Basophils Relative: 1 %
Eosinophils Absolute: 523 cells/uL — ABNORMAL HIGH (ref 15–500)
Eosinophils Relative: 6.7 %
HCT: 34.3 % — ABNORMAL LOW (ref 35.0–45.0)
Hemoglobin: 11.4 g/dL — ABNORMAL LOW (ref 11.7–15.5)
Lymphs Abs: 1763 cells/uL (ref 850–3900)
MCH: 30.8 pg (ref 27.0–33.0)
MCHC: 33.2 g/dL (ref 32.0–36.0)
MCV: 92.7 fL (ref 80.0–100.0)
MPV: 9 fL (ref 7.5–12.5)
Monocytes Relative: 6.9 %
Neutro Abs: 4898 cells/uL (ref 1500–7800)
Neutrophils Relative %: 62.8 %
Platelets: 554 10*3/uL — ABNORMAL HIGH (ref 140–400)
RBC: 3.7 10*6/uL — ABNORMAL LOW (ref 3.80–5.10)
RDW: 13 % (ref 11.0–15.0)
Total Lymphocyte: 22.6 %
WBC: 7.8 10*3/uL (ref 3.8–10.8)

## 2022-04-15 LAB — URINE CULTURE
MICRO NUMBER:: 13814659
SPECIMEN QUALITY:: ADEQUATE

## 2022-04-15 LAB — PROCALCITONIN: Procalcitonin: 0.11 ng/mL — ABNORMAL HIGH (ref <0.10)

## 2022-04-16 ENCOUNTER — Encounter (HOSPITAL_BASED_OUTPATIENT_CLINIC_OR_DEPARTMENT_OTHER): Payer: Medicare Other | Attending: General Surgery | Admitting: General Surgery

## 2022-04-16 ENCOUNTER — Ambulatory Visit: Payer: Medicare Other | Admitting: Internal Medicine

## 2022-04-16 DIAGNOSIS — I129 Hypertensive chronic kidney disease with stage 1 through stage 4 chronic kidney disease, or unspecified chronic kidney disease: Secondary | ICD-10-CM | POA: Diagnosis not present

## 2022-04-16 DIAGNOSIS — L84 Corns and callosities: Secondary | ICD-10-CM | POA: Diagnosis not present

## 2022-04-16 DIAGNOSIS — E11621 Type 2 diabetes mellitus with foot ulcer: Secondary | ICD-10-CM | POA: Insufficient documentation

## 2022-04-16 DIAGNOSIS — L97521 Non-pressure chronic ulcer of other part of left foot limited to breakdown of skin: Secondary | ICD-10-CM | POA: Insufficient documentation

## 2022-04-16 DIAGNOSIS — L97522 Non-pressure chronic ulcer of other part of left foot with fat layer exposed: Secondary | ICD-10-CM | POA: Diagnosis not present

## 2022-04-16 NOTE — Progress Notes (Addendum)
Grace [name], Grace [name] ([id number]) Visit Report for 04/16/2022 Allergy List Details Patient Name: Date of Service: Grace [name], Grace [name] 04/16/2022 10:30 A M Medical Record Number: [id number] Patient Account Number: [id number] Date of Birth/Sex: Treating RN: Sep 14, 1950 (70 y.o. Marta Lamas Primary Care Dayvin Aber: Kirtland Bouchard Other Clinician: Referring Marzell Allemand: Treating Haili Donofrio/Extender: Lucretia Kern Weeks in Treatment: 0 Allergies Active Allergies gabapentin Reaction: swelling in extremities Severity: Moderate Allergy Notes Electronic Signature(s) Signed: 04/16/2022 5:02:02 PM By: Blanche East RN Entered By: Blanche East on 04/16/2022 10:42:56 -------------------------------------------------------------------------------- Arrival Information Details Patient Name: Date of Service: Grace [name] 04/16/2022 10:30 A M Medical Record Number: [id number] Patient Account Number: [id number] Date of Birth/Sex: Treating RN: 1951-02-03 (71 y.o. Marta Lamas Primary Care Sharnay Cashion: Kirtland Bouchard Other Clinician: Referring Tavi Hoogendoorn: Treating Nayshawn Mesta/Extender: Lorie Phenix in Treatment: 0 Visit Information Patient Arrived: Gilford Rile Arrival Time: 10:28 Accompanied By: husband Transfer Assistance: None Patient Identification Verified: Yes Secondary Verification Process Completed: Yes Patient Requires Transmission-Based Precautions: No Patient Has Alerts: No Electronic Signature(s) Signed: 04/16/2022 5:02:02 PM By: Blanche East RN Entered By: Blanche East on 04/16/2022 10:41:02 -------------------------------------------------------------------------------- Clinic Level of Care Assessment Details Patient Name: Date of Service: Grace [name], Grace [name] 04/16/2022 10:30 A M Medical Record Number: [id number] Patient Account Number: [id number] Date of Birth/Sex: Treating RN: 1951/02/26 (71 y.o. Marta Lamas Primary Care  Phillipa Morden: Kirtland Bouchard Other Clinician: Referring Rasean Joos: Treating Kayce Betty/Extender: Lucretia Kern Weeks in Treatment: 0 Clinic Level of Care Assessment Items TOOL 1 Quantity Score X- 1 0 Use when EandM and Procedure is performed on INITIAL visit ASSESSMENTS - Nursing Assessment / Reassessment X- 1 20 General Physical Exam (combine w/ comprehensive assessment (listed just below) when performed on new pt. evals) X- 1 25 Comprehensive Assessment (HX, ROS, Risk Assessments, Wounds Hx, etc.) ASSESSMENTS - Wound and Skin Assessment / Reassessment '[]'$  - 0 Dermatologic / Skin Assessment (not related to wound area) ASSESSMENTS - Ostomy and/or Continence Assessment and Care '[]'$  - 0 Incontinence Assessment and Management '[]'$  - 0 Ostomy Care Assessment and Management (repouching, etc.) PROCESS - Coordination of Care X - Simple Patient / Family Education for ongoing care 1 15 '[]'$  - 0 Complex (extensive) Patient / Family Education for ongoing care X- 1 10 Staff obtains Programmer, systems, Records, T Results / Process Orders est '[]'$  - 0 Staff telephones HHA, Nursing Homes / Clarify orders / etc '[]'$  - 0 Routine Transfer to another Facility (non-emergent condition) '[]'$  - 0 Routine Hospital Admission (non-emergent condition) '[]'$  - 0 New Admissions / Biomedical engineer / Ordering NPWT Apligraf, etc. , '[]'$  - 0 Emergency Hospital Admission (emergent condition) PROCESS - Special Needs '[]'$  - 0 Pediatric / Minor Patient Management '[]'$  - 0 Isolation Patient Management '[]'$  - 0 Hearing / Language / Visual special needs '[]'$  - 0 Assessment of Community assistance (transportation, D/C planning, etc.) '[]'$  - 0 Additional assistance / Altered mentation '[]'$  - 0 Support Surface(s) Assessment (bed, cushion, seat, etc.) INTERVENTIONS - Miscellaneous '[]'$  - 0 External ear exam '[]'$  - 0 Patient Transfer (multiple staff / Civil Service fast streamer / Similar devices) '[]'$  - 0 Simple Staple / Suture removal  (25 or less) '[]'$  - 0 Complex Staple / Suture removal (26 or more) '[]'$  - 0 Hypo/Hyperglycemic Management (do not check if billed separately) X- 1 15 Ankle / Brachial Index (ABI) - do not check if billed separately Has the patient been seen at the hospital within the last three years: Yes Total Score: 85 Level  Of Care: New/Established - Level 3 Electronic Signature(s) Signed: 04/16/2022 5:02:02 PM By: Blanche East RN Entered By: Blanche East on 04/16/2022 11:49:16 -------------------------------------------------------------------------------- Encounter Discharge Information Details Patient Name: Date of Service: Grace [name], Grace [name] 04/16/2022 10:30 A M Medical Record Number: [id number] Patient Account Number: [id number] Date of Birth/Sex: Treating RN: [birth year] (71 y.o. Marta Lamas Primary Care Evanny Ellerbe: Kirtland Bouchard Other Clinician: Referring Jhalil Silvera: Treating Alannis Hsia/Extender: Lucretia Kern Weeks in Treatment: 0 Encounter Discharge Information Items Post Procedure Vitals Discharge Condition: Stable Temperature (F): 98.3 Ambulatory Status: Ambulatory Pulse (bpm): 79 Discharge Destination: Home Respiratory Rate (breaths/min): 18 Transportation: Ambulance Blood Pressure (mmHg): 148/71 Accompanied By: husband Schedule Follow-up Appointment: No Clinical Summary of Care: Electronic Signature(s) Signed: 04/16/2022 5:02:02 PM By: Blanche East RN Entered By: Blanche East on 04/16/2022 12:01:26 -------------------------------------------------------------------------------- Lower Extremity Assessment Details Patient Name: Date of Service: Grace [name], Grace [name] 04/16/2022 10:30 A M Medical Record Number: [id number] Patient Account Number: [id number] Date of Birth/Sex: Treating RN: 1951/06/08 (71 y.o. Iver Nestle, Jamie Primary Care Roxie Gueye: Kirtland Bouchard Other Clinician: Referring Charnee Turnipseed: Treating Kemi Gell/Extender: Lucretia Kern Weeks in Treatment: 0 Edema Assessment Assessed: [Left: No] [Right: No] E[Left: dema] [Right: :] Calf Left: Right: Point of Measurement: From Medial Instep 33 cm Ankle Left: Right: Point of Measurement: From Medial Instep 21.2 cm Knee To Floor Left: Right: From Medial Instep 41.5 cm Vascular Assessment Pulses: Dorsalis Pedis Palpable: [Left:Yes] Blood Pressure: Brachial: [Left:148] Dorsalis Pedis: 80 Ankle: [Left:Posterior Tibial: 80] Ankle Brachial Index: [Left:0.54] Electronic Signature(s) Signed: 04/16/2022 5:02:02 PM By: Blanche East RN Entered By: Blanche East on 04/16/2022 11:11:25 -------------------------------------------------------------------------------- Multi Wound Chart Details Patient Name: Date of Service: Grace [name] 04/16/2022 10:30 A M Medical Record Number: [id number] Patient Account Number: [id number] Date of Birth/Sex: Treating RN: 1951/02/20 (71 y.o. Marta Lamas Primary Care Terrea Bruster: Kirtland Bouchard Other Clinician: Referring Chanze Teagle: Treating Sharmane Dame/Extender: Lucretia Kern Weeks in Treatment: 0 Vital Signs Height(in): 64 Capillary Blood Glucose(mg/dl): 115 Weight(lbs): 155 Pulse(bpm): 52 Body Mass Index(BMI): 26.6 Blood Pressure(mmHg): 148/71 Temperature(F): 98.3 Respiratory Rate(breaths/min): 18 Photos: [1:No Photos Left Metatarsal head first] [N/A:N/A N/A] Wound Location: [1:Gradually Appeared] [N/A:N/A] Wounding Event: [1:Diabetic Wound/Ulcer of the Lower] [N/A:N/A] Primary Etiology: [1:Extremity Cataracts, Sleep Apnea, Coronary] [N/A:N/A] Comorbid History: [1:Artery Disease, Hypertension, Type II Diabetes, Osteoarthritis, Neuropathy 03/27/2022] [N/A:N/A] Date Acquired: [1:0] [N/A:N/A] Weeks of Treatment: [1:Open] [N/A:N/A] Wound Status: [1:No] [N/A:N/A] Wound Recurrence: [1:0.4x0.4x0.1] [N/A:N/A] Measurements L x W x D (cm) [1:0.126] [N/A:N/A] A (cm) : rea  [1:0.013] [N/A:N/A] Volume (cm) : [1:Grade 2] [N/A:N/A] Classification: [1:None Present] [N/A:N/A] Exudate A mount: [1:Flat and Intact] [N/A:N/A] Wound Margin: [1:Large (67-100%)] [N/A:N/A] Granulation A mount: [1:Small (1-33%)] [N/A:N/A] Necrotic A mount: [1:Eschar] [N/A:N/A] Necrotic Tissue: [1:Fat Layer (Subcutaneous Tissue): Yes N/A] Exposed Structures: [1:Fascia: No Tendon: No Muscle: No Joint: No Bone: No None] [N/A:N/A] Epithelialization: [1:Debridement - Selective/Open Wound N/A] Debridement: Pre-procedure Verification/Time Out 11:20 [N/A:N/A] Taken: [1:Lidocaine 5% topical ointment] [N/A:N/A] Pain Control: [1:Callus] [N/A:N/A] Tissue Debrided: [1:Skin/Epidermis] [N/A:N/A] Level: [1:1] [N/A:N/A] Debridement A (sq cm): [1:rea Curette] [N/A:N/A] Instrument: [1:None] [N/A:N/A] Bleeding: [1:0] [N/A:N/A] Procedural Pain: [1:0] [N/A:N/A] Post Procedural Pain: [1:Procedure was tolerated well] [N/A:N/A] Debridement Treatment Response: [1:0.4x0.4x0.1] [N/A:N/A] Post Debridement Measurements L x W x D (cm) [1:0.013] [N/A:N/A] Post Debridement Volume: (cm) [1:Debridement] [N/A:N/A] Treatment Notes Electronic Signature(s) Signed: 04/16/2022 11:36:59 AM By: Fredirick Maudlin MD FACS Signed: 04/16/2022 5:02:02 PM By: Blanche East RN Entered By: Fredirick Maudlin on 04/16/2022 11:36:59 -------------------------------------------------------------------------------- Multi-Disciplinary Care Plan Details Patient Name: Date  of Service: Grace [name], Grace [name] 04/16/2022 10:30 A M Medical Record Number: [id number] Patient Account Number: [id number] Date of Birth/Sex: Treating RN: January 24, 1951 (71 y.o. Marta Lamas Primary Care Jalecia Leon: Kirtland Bouchard Other Clinician: Referring Maeson Purohit: Treating Thor Nannini/Extender: Lucretia Kern Weeks in Treatment: 0 Active Inactive Electronic Signature(s) Signed: 04/22/2022 12:19:15 PM By: Blanche East RN Previous Signature:  04/16/2022 5:02:02 PM Version By: Blanche East RN Entered By: Blanche East on 04/18/2022 13:40:13 -------------------------------------------------------------------------------- Pain Assessment Details Patient Name: Date of Service: Grace [name], Grace [name] 04/16/2022 10:30 A M Medical Record Number: [id number] Patient Account Number: [id number] Date of Birth/Sex: Treating RN: 02/19/1951 (71 y.o. Marta Lamas Primary Care Fujie Dickison: Kirtland Bouchard Other Clinician: Referring Valyn Latchford: Treating Starr Engel/Extender: Lucretia Kern Weeks in Treatment: 0 Active Problems Location of Pain Severity and Description of Pain Patient Has Paino Yes Site Locations Rate the pain. Current Pain Level: 0 Pain Management and Medication Current Pain Management: Notes 4/10 pain when putting pressure on wound Electronic Signature(s) Signed: 04/16/2022 5:02:02 PM By: Blanche East RN Entered By: Blanche East on 04/16/2022 11:12:11 -------------------------------------------------------------------------------- Patient/Caregiver Education Details Patient Name: Date of Service: Grace [name] 8/30/2023andnbsp10:30 A M Medical Record Number: [id number] Patient Account Number: [id number] Date of Birth/Gender: Treating RN: 06/06/1951 (71 y.o. Marta Lamas Primary Care Physician: Kirtland Bouchard Other Clinician: Referring Physician: Treating Physician/Extender: Lorie Phenix in Treatment: 0 Education Assessment Education Provided To: Patient Education Topics Provided Electronic Signature(s) Signed: 04/22/2022 12:19:15 PM By: Blanche East RN Previous Signature: 04/16/2022 5:02:02 PM Version By: Blanche East RN Entered By: Blanche East on 04/18/2022 13:39:44 -------------------------------------------------------------------------------- Wound Assessment Details Patient Name: Date of Service: Grace [name], Grace [name] 04/16/2022 10:30 A  M Medical Record Number: [id number] Patient Account Number: [id number] Date of Birth/Sex: Treating RN: [birth year] (71 y.o. Iver Nestle, Jamie Primary Care Shanielle Correll: Kirtland Bouchard Other Clinician: Referring Yoseph Haile: Treating Talullah Abate/Extender: Lucretia Kern Weeks in Treatment: 0 Wound Status Wound Number: 1 Primary Diabetic Wound/Ulcer of the Lower Extremity Etiology: Wound Location: Left Metatarsal head first Wound Healed - Epithelialized Wounding Event: Blister Status: Date Acquired: 03/27/2022 Comorbid Cataracts, Sleep Apnea, Coronary Artery Disease, Hypertension, Weeks Of Treatment: 0 History: Type II Diabetes, Osteoarthritis, Neuropathy Clustered Wound: No Wound Measurements Length: (cm) Width: (cm) Depth: (cm) Area: (cm) Volume: (cm) 0 % Reduction in Area: 0% 0 % Reduction in Volume: 0% 0 Epithelialization: None 0 Tunneling: No 0 Undermining: No Wound Description Classification: Grade 2 Wound Margin: Flat and Intact Exudate Amount: None Present Wound Bed Granulation Amount: Large (67-100%) Necrotic Amount: None Present (0%) Foul Odor After Cleansing: No Slough/Fibrino No Exposed Structure Fascia Exposed: No Fat Layer (Subcutaneous Tissue) Exposed: No Tendon Exposed: No Muscle Exposed: No Joint Exposed: No Bone Exposed: No Treatment Notes Wound #1 (Metatarsal head first) Wound Laterality: Left Cleanser Peri-Wound Care Topical Primary Dressing Secondary Dressing Secured With Compression Wrap Compression Stockings Add-Ons Electronic Signature(s) Signed: 04/16/2022 5:02:02 PM By: Blanche East RN Entered By: Blanche East on 04/16/2022 11:44:48 -------------------------------------------------------------------------------- [place] Details Patient Name: Date of Service: Grace Amel B. 04/16/2022 10:30 A M Medical Record Number: [id number] Patient Account Number: [id number] Date of Birth/Sex: Treating RN: 08/24/1950 (71  y.o. Marta Lamas Primary Care Artie Mcintyre: Kirtland Bouchard Other Clinician: Referring Ettore Trebilcock: Treating Violett Hobbs/Extender: Lucretia Kern Weeks in Treatment: 0 Vital Signs Time Taken: 10:41 Temperature (F): 98.3 Height (in): 64 Pulse (bpm): 79 Source: Stated Respiratory Rate (breaths/min): 18 Weight (lbs): 155 Blood Pressure (mmHg): 148/71 Source: Stated Capillary  Blood Glucose (mg/dl): 115 Body Mass Index (BMI): 26.6 Reference Range: 80 - 120 mg / dl Electronic Signature(s) Signed: 04/16/2022 5:02:02 PM By: Blanche East RN Entered By: Blanche East on 04/16/2022 10:42:06

## 2022-04-16 NOTE — Progress Notes (Signed)
Grace [name], Grace [name] ([id number]) Visit Report for 04/16/2022 Abuse Risk Screen Details Patient Name: Date of Service: Grace [name], Grace [name] 04/16/2022 10:30 A M Medical Record Number: [id number] Patient Account Number: [id number] Date of Birth/Sex: Treating RN: 08-Nov-1950 (71 y.o. Iver Nestle, Jamie Primary Care Diana Davenport: Kirtland Bouchard Other Clinician: Referring Teddy Rebstock: Treating Rayelynn Loyal/Extender: Lucretia Kern Weeks in Treatment: 0 Abuse Risk Screen Items Answer ABUSE RISK SCREEN: Has anyone close to you tried to hurt or harm you recentlyo No Do you feel uncomfortable with anyone in your familyo No Has anyone forced you do things that you didnt want to doo No Electronic Signature(s) Signed: 04/16/2022 5:02:02 PM By: Blanche East RN Entered By: Blanche East on 04/16/2022 10:52:25 -------------------------------------------------------------------------------- Activities of Daily Living Details Patient Name: Date of Service: Grace [name], Grace [name] 04/16/2022 10:30 A M Medical Record Number: [id number] Patient Account Number: [id number] Date of Birth/Sex: Treating RN: 07/05/1951 (71 y.o. Marta Lamas Primary Care Jahnaya Branscome: Kirtland Bouchard Other Clinician: Referring Mackenna Kamer: Treating Frady Taddeo/Extender: Lucretia Kern Weeks in Treatment: 0 Activities of Daily Living Items Answer Activities of Daily Living (Please select one for each item) Drive Automobile Not Able T Medications ake Completely Able Use T elephone Completely Able Care for Appearance Need Assistance Use T oilet Need Assistance Bath / Shower Need Assistance Dress Self Completely Able Feed Self Completely Able Walk Need Assistance Get In / Out Bed Completely [place] Need Assistance Shop for Self Need Assistance Electronic Signature(s) Signed: 04/16/2022 5:02:02 PM By: Blanche East RN Entered By:  Blanche East on 04/16/2022 10:53:23 -------------------------------------------------------------------------------- Education Screening Details Patient Name: Date of Service: Grace [name] 04/16/2022 10:30 A M Medical Record Number: [id number] Patient Account Number: [id number] Date of Birth/Sex: Treating RN: [birth year] (71 y.o. Iver Nestle, Jamie Primary Care Porsha Skilton: Kirtland Bouchard Other Clinician: Referring Johnni Wunschel: Treating Marbella Markgraf/Extender: Lorie Phenix in Treatment: 0 Learning Preferences/Education Level/Primary Language Learning Preference: Explanation Highest Education Level: College or Above Preferred Language: English Cognitive Barrier Language Barrier: No Translator Needed: No Memory Deficit: No Emotional Barrier: No Cultural/Religious Beliefs Affecting Medical Care: No Physical Barrier Impaired Vision: Yes Glasses Impaired Hearing: No Decreased Hand dexterity: Yes Knowledge/Comprehension Knowledge Level: High Comprehension Level: High Ability to understand written instructions: High Ability to understand verbal instructions: High Motivation Anxiety Level: Calm Cooperation: Cooperative Education Importance: Acknowledges Need Interest in Health Problems: Asks Questions Perception: Coherent Willingness to Engage in Self-Management High Activities: Readiness to Engage in Self-Management High Activities: Electronic Signature(s) Signed: 04/16/2022 5:02:02 PM By: Blanche East RN Entered By: Blanche East on 04/16/2022 10:53:53 -------------------------------------------------------------------------------- Fall Risk Assessment Details Patient Name: Date of Service: Grace Amel B. 04/16/2022 10:30 A M Medical Record Number: [id number] Patient Account Number: [id number] Date of Birth/Sex: Treating RN: [birth year] (71 y.o. Iver Nestle, Jamie Primary Care Aasha Dina: Kirtland Bouchard Other Clinician: Referring  Karion Cudd: Treating Lesley Galentine/Extender: Lucretia Kern Weeks in Treatment: 0 Fall Risk Assessment Items Have you had 2 or more falls in the last 12 monthso 0 No Have you had any fall that resulted in injury in the last 12 monthso 0 No FALLS RISK SCREEN History of falling - immediate or within 3 months 0 No Secondary diagnosis (Do you have 2 or more medical diagnoseso) 0 No Ambulatory aid None/bed rest/wheelchair/nurse 0 No Crutches/cane/walker 15 Yes Furniture 0 No Intravenous therapy Access/Saline/Heparin Lock 0 No Gait/Transferring Normal/ bed rest/ wheelchair 0 No Weak (short steps with or without shuffle, stooped  but able to lift head while walking, may seek 10 Yes support from furniture) Impaired (short steps with shuffle, may have difficulty arising from chair, head down, impaired 0 No balance) Mental Status Oriented to own ability 0 Yes Electronic Signature(s) Signed: 04/16/2022 5:02:02 PM By: Blanche East RN Entered By: Blanche East on 04/16/2022 10:55:17 -------------------------------------------------------------------------------- Foot Assessment Details Patient Name: Date of Service: Grace [name], Grace [name] 04/16/2022 10:30 A M Medical Record Number: [id number] Patient Account Number: [id number] Date of Birth/Sex: Treating RN: [birth year] (71 y.o. Marta Lamas Primary Care Tilley Faeth: Kirtland Bouchard Other Clinician: Referring Danniella Robben: Treating Cordarryl Monrreal/Extender: Lucretia Kern Weeks in Treatment: 0 Foot Assessment Items Site Locations + = Sensation present, - = Sensation absent, C = Callus, U = Ulcer R = Redness, W = Warmth, M = Maceration, PU = Pre-ulcerative lesion F = Fissure, S = Swelling, D = Dryness Assessment Right: Left: Other Deformity: No No Prior Foot Ulcer: No No Prior Amputation: No No Charcot Joint: No No Ambulatory Status: Ambulatory With Help Assistance Device: Walker Gait: Steady Electronic  Signature(s) Signed: 04/16/2022 5:02:02 PM By: Blanche East RN Entered By: Blanche East on 04/16/2022 11:00:28 -------------------------------------------------------------------------------- Nutrition Risk Screening Details Patient Name: Date of Service: Grace [name], Grace [name] 04/16/2022 10:30 A M Medical Record Number: [id number] Patient Account Number: [id number] Date of Birth/Sex: Treating RN: 1951-03-12 (71 y.o. Iver Nestle, Jamie Primary Care Tiea Manninen: Kirtland Bouchard Other Clinician: Referring Corbet Hanley: Treating Breck [place]/Extender: Lucretia Kern Weeks in Treatment: 0 Height (in): 64 Weight (lbs): 155 Body Mass Index (BMI): 26.6 Nutrition Risk Screening Items Score Screening NUTRITION RISK SCREEN: I have an illness or condition that made me change the kind and/or amount of food I eat 0 No I eat fewer than two meals per day 0 No I eat few fruits and vegetables, or milk products 0 No I have three or more drinks of beer, liquor or wine almost every day 0 No I have tooth or mouth problems that make it hard for me to eat 0 No I don't always have enough money to buy the food I need 0 No I eat alone most of the time 0 No I take three or more different prescribed or over-the-counter drugs a day 1 Yes Without wanting to, I have lost or gained 10 pounds in the last six months 2 Yes I am not always physically able to shop, cook and/or feed myself 2 Yes Nutrition Protocols Good Risk Protocol Provide education on elevated blood Moderate Risk Protocol 0 sugars and impact on wound healing, as applicable High Risk Proctocol Risk Level: Moderate Risk Score: 5 Electronic Signature(s) Signed: 04/16/2022 5:02:02 PM By: Blanche East RN Entered By: Blanche East on 04/16/2022 10:56:43

## 2022-04-16 NOTE — Progress Notes (Addendum)
Grace [name], Grace [name] ([id number]) Visit Report for 04/16/2022 Chief Complaint Document Details Patient Name: Date of Service: Grace [name], Grace [name] 04/16/2022 10:30 A M Medical Record Number: [id number] Patient Account Number: [id number] Date of Birth/Sex: Treating RN: 30-Mar-1951 (71 y.o. Marta Lamas Primary Care Provider: Kirtland Bouchard Other Clinician: Referring Provider: Treating Provider/Extender: Lucretia Kern Weeks in Treatment: 0 Information Obtained from: Patient Chief Complaint Patient presents to the wound care center due with non-wound condition(s) Electronic Signature(s) Signed: 04/16/2022 11:37:14 AM By: Fredirick Maudlin MD FACS Entered By: Fredirick Maudlin on 04/16/2022 11:37:14 -------------------------------------------------------------------------------- Debridement Details Patient Name: Date of Service: Grace [name] 04/16/2022 10:30 A M Medical Record Number: [id number] Patient Account Number: [id number] Date of Birth/Sex: Treating RN: 06/06/1951 (71 y.o. Iver Nestle, Jamie Primary Care Provider: Kirtland Bouchard Other Clinician: Referring Provider: Treating Provider/Extender: Lucretia Kern Weeks in Treatment: 0 Debridement Performed for Assessment: Wound #1 Left Metatarsal head first Performed By: Physician Fredirick Maudlin, MD Debridement Type: Debridement Severity of Tissue Pre Debridement: Fat layer exposed Level of Consciousness (Pre-procedure): Awake and Alert Pre-procedure Verification/Time Out Yes - 11:20 Taken: Start Time: 11:21 Pain Control: Lidocaine 5% topical ointment T Area Debrided (L x W): otal 1 (cm) x 1 (cm) = 1 (cm) Tissue and other material debrided: Viable, Non-Viable, Callus, Skin: Epidermis Level: Skin/Epidermis Debridement Description: Selective/Open Wound Instrument: Curette Bleeding: None Procedural Pain: 0 Post Procedural Pain: 0 Response to Treatment: Procedure was tolerated  well Level of Consciousness (Post- Awake and Alert procedure): Post Debridement Measurements of Total Wound Length: (cm) 0.4 Width: (cm) 0.4 Depth: (cm) 0.1 Volume: (cm) 0.013 Character of Wound/Ulcer Post Debridement: Requires Further Debridement Severity of Tissue Post Debridement: Fat layer exposed Post Procedure Diagnosis Same as Pre-procedure Electronic Signature(s) Signed: 04/16/2022 12:13:41 PM By: Fredirick Maudlin MD FACS Signed: 04/16/2022 5:02:02 PM By: Blanche East RN Entered By: Blanche East on 04/16/2022 11:22:51 -------------------------------------------------------------------------------- HPI Details Patient Name: Date of Service: Grace Amel B. 04/16/2022 10:30 A M Medical Record Number: [id number] Patient Account Number: [id number] Date of Birth/Sex: Treating RN: 1951/03/21 (71 y.o. Marta Lamas Primary Care Provider: Kirtland Bouchard Other Clinician: Referring Provider: Treating Provider/Extender: Lucretia Kern Weeks in Treatment: 0 History of Present Illness HPI Description: CONSULTATION ONLY 04/16/2022 This is a 71 year old type II diabetic with peripheral neuropathy (last hemoglobin A1c 7.8%) who was actually seen at the [facility] wound care center at the beginning of August for a left foot blister. Few days after that visit, she was admitted to the hospital with sepsis. She had a urinary tract infection but there was also some concern that she could have sepsis from left foot cellulitis. An MRI was performed that did not show any evidence of acute osteomyelitis but she did have a fair amount of subcutaneous edema over the dorsal aspect of her forefoot. There was also some question as to whether or not she could have a dental abscess. She was discharged after 3 days in the hospital. She was given an 8-day course of oral Augmentin on discharge due to concern for the dental abscess; her urine culture on admission was actually  negative for any growth. Reading the admission notes, she was seen by the wound care nurse who reported an opening in the first metatarsal head in the center of a callus. The blister area just had a dry white skin without any obvious opening to the underlying tissue. Today, the area where the blister was has healed. There is discolored site in  the center of a very thick callus on her first metatarsal head. No obvious opening in the underlying skin. Electronic Signature(s) Signed: 04/16/2022 11:52:29 AM By: Fredirick Maudlin MD FACS Previous Signature: 04/16/2022 11:38:36 AM Version By: Fredirick Maudlin MD FACS Entered By: Fredirick Maudlin on 04/16/2022 11:52:29 -------------------------------------------------------------------------------- Physical Exam Details Patient Name: Date of Service: Grace [name], Grace [name] 04/16/2022 10:30 A M Medical Record Number: [id number] Patient Account Number: [id number] Date of Birth/Sex: Treating RN: Jul 18, 1951 (71 y.o. Marta Lamas Primary Care Provider: Kirtland Bouchard Other Clinician: Referring Provider: Treating Provider/Extender: Lucretia Kern Weeks in Treatment: 0 Constitutional Hypertensive, asymptomatic. . . . No acute distress. Respiratory Normal work of breathing on room air.. Notes 04/16/2022: The area where the blister was has healed. There is discolored site in the center of a very thick callus on her first metatarsal head. No obvious opening in the underlying skin. Electronic Signature(s) Signed: 04/16/2022 11:55:16 AM By: Fredirick Maudlin MD FACS Entered By: Fredirick Maudlin on 04/16/2022 11:55:16 -------------------------------------------------------------------------------- Physician Orders Details Patient Name: Date of Service: Grace [name], Grace [name] 04/16/2022 10:30 A M Medical Record Number: [id number] Patient Account Number: [id number] Date of Birth/Sex: Treating RN: [birth year] (71 y.o. Iver Nestle,  Jamie Primary Care Provider: Kirtland Bouchard Other Clinician: Referring Provider: Treating Provider/Extender: Lucretia Kern Weeks in Treatment: 0 Verbal / Phone Orders: No Diagnosis Coding ICD-10 Coding Code Description [phone] Non-pressure chronic ulcer of other part of left foot limited to breakdown of skin E11.621 Type 2 diabetes mellitus with foot ulcer I73.9 Peripheral vascular disease, unspecified N18.2 Chronic kidney disease, stage 2 (mild) Discharge From [facility] Services Discharge from [facility] (In clinic) Topical Lidocaine 5% applied to wound bed - prior to debridement Bathing/ Shower/ Hygiene May shower and wash wound with soap and water. Electronic Signature(s) Signed: 04/16/2022 11:56:48 AM By: Fredirick Maudlin MD FACS Entered By: Fredirick Maudlin on 04/16/2022 11:56:48 -------------------------------------------------------------------------------- Problem List Details Patient Name: Date of Service: Grace [name], Grace [name] 04/16/2022 10:30 A M Medical Record Number: [id number] Patient Account Number: [id number] Date of Birth/Sex: Treating RN: 1950/12/05 (71 y.o. Marta Lamas Primary Care Provider: Kirtland Bouchard Other Clinician: Referring Provider: Treating Provider/Extender: Lucretia Kern Weeks in Treatment: 0 Active Problems ICD-10 Encounter Code Description Active Date MDM Diagnosis L84 Corns and callosities 04/16/2022 No Yes E11.621 Type 2 diabetes mellitus with foot ulcer 04/16/2022 No Yes I73.9 Peripheral vascular disease, unspecified 04/16/2022 No Yes N18.2 Chronic kidney disease, stage 2 (mild) 04/16/2022 No Yes Inactive Problems Resolved Problems Electronic Signature(s) Signed: 04/16/2022 11:36:47 AM By: Fredirick Maudlin MD FACS Previous Signature: 04/16/2022 10:34:42 AM Version By: Fredirick Maudlin MD FACS Entered By: Fredirick Maudlin on 04/16/2022  11:36:46 -------------------------------------------------------------------------------- Progress Note Details Patient Name: Date of Service: Grace [name] 04/16/2022 10:30 A M Medical Record Number: [id number] Patient Account Number: [id number] Date of Birth/Sex: Treating RN: December 06, 1950 (71 y.o. Marta Lamas Primary Care Provider: Kirtland Bouchard Other Clinician: Referring Provider: Treating Provider/Extender: Lucretia Kern Weeks in Treatment: 0 Subjective Chief Complaint Information obtained from Patient Patient presents to the wound care center due with non-wound condition(s) History of Present Illness (HPI) CONSULTATION ONLY 04/16/2022 This is a 71 year old type II diabetic with peripheral neuropathy (last hemoglobin A1c 7.8%) who was actually seen at the [facility] wound care center at the beginning of August for a left foot blister. Few days after that visit, she was admitted to the hospital with sepsis. She had a urinary tract infection but there was also  some concern that she could have sepsis from left foot cellulitis. An MRI was performed that did not show any evidence of acute osteomyelitis but she did have a fair amount of subcutaneous edema over the dorsal aspect of her forefoot. There was also some question as to whether or not she could have a dental abscess. She was discharged after 3 days in the hospital. She was given an 8-day course of oral Augmentin on discharge due to concern for the dental abscess; her urine culture on admission was actually negative for any growth. Reading the admission notes, she was seen by the wound care nurse who reported an opening in the first metatarsal head in the center of a callus. The blister area just had a dry white skin without any obvious opening to the underlying tissue. Today, the area where the blister was has healed. There is discolored site in the center of a very thick callus on her first metatarsal  head. No obvious opening in the underlying skin. Patient History Information obtained from Patient. Allergies gabapentin (Severity: Moderate, Reaction: swelling in extremities) Family History Diabetes - Mother,Maternal Grandparents,Siblings, Heart Disease - Mother, Hereditary Spherocytosis - Father, Hypertension - Mother, Lung Disease - Father, Thyroid Problems - Mother, No family history of Kidney Disease, Seizures, Stroke, Tuberculosis. Social History Never smoker, Marital Status - Married, Alcohol Use - Never, Drug Use - No History, Caffeine Use - Daily - 3 cups. Medical History Eyes Patient has history of Cataracts Ear/Nose/Mouth/Throat Denies history of Chronic sinus problems/congestion, Middle ear problems Respiratory Patient has history of Sleep Apnea Denies history of Asthma, Chronic Obstructive Pulmonary Disease (COPD) Cardiovascular Patient has history of Coronary Artery Disease, Hypertension Denies history of Angina, Arrhythmia, Congestive Heart Failure, Myocardial Infarction Endocrine Patient has history of Type II Diabetes - 2008 Musculoskeletal Patient has history of Osteoarthritis Neurologic Patient has history of Neuropathy Oncologic Denies history of Received Chemotherapy, Received Radiation Psychiatric Denies history of Anorexia/bulimia, Confinement Anxiety Patient is treated with Oral Agents. Blood sugar is not tested. Medical A Surgical History Notes nd Eyes diabetic retinopathy of left eye Endocrine diabetic polyneuropathy Genitourinary cholelithiasis CKD stage II Review of Systems (ROS) Constitutional Symptoms (General Health) Denies complaints or symptoms of Fatigue, Fever, Chills, Marked Weight Change. Eyes Complains or has symptoms of Glasses / Contacts - glasses. Denies complaints or symptoms of Dry Eyes, Vision Changes. Respiratory Denies complaints or symptoms of Chronic or frequent coughs, Shortness of Breath. Cardiovascular Denies  complaints or symptoms of Chest pain. Gastrointestinal Denies complaints or symptoms of Frequent diarrhea, Nausea, Vomiting. Integumentary (Skin) Complains or has symptoms of Wounds, LLE Musculoskeletal Denies complaints or symptoms of Muscle Pain, Muscle Weakness. Neurologic Complains or has symptoms of Numbness/parasthesias - BLE. Psychiatric Denies complaints or symptoms of Claustrophobia, Suicidal. Objective Constitutional Hypertensive, asymptomatic. No acute distress. Vitals Time Taken: 10:41 AM, Height: 64 in, Source: Stated, Weight: 155 lbs, Source: Stated, BMI: 26.6, Temperature: 98.3 F, Pulse: 79 bpm, Respiratory Rate: 18 breaths/min, Blood Pressure: 148/71 mmHg, Capillary Blood Glucose: 115 mg/dl. Respiratory Normal work of breathing on room air.. General Notes: 04/16/2022: The area where the blister was has healed. There is discolored site in the center of a very thick callus on her first metatarsal head. No obvious opening in the underlying skin. Integumentary (Hair, Skin) Wound #1 status is Healed - Epithelialized. Original cause of wound was Blister. The date acquired was: 03/27/2022. The wound is located on the Left Metatarsal head first. The wound measures 0cm length x 0cm width x 0cm  depth; 0cm^2 area and 0cm^3 volume. There is no tunneling or undermining noted. There is a none present amount of drainage noted. The wound margin is flat and intact. There is large (67-100%) granulation within the wound bed. There is no necrotic tissue within the wound bed. Assessment Active Problems ICD-10 Corns and callosities Type 2 diabetes mellitus with foot ulcer Peripheral vascular disease, unspecified Chronic kidney disease, stage 2 (mild) Procedures Wound #1 Pre-procedure diagnosis of Wound #1 is a Diabetic Wound/Ulcer of the Lower Extremity located on the Left Metatarsal head first .Severity of Tissue Pre Debridement is: Fat layer exposed. There was a Selective/Open Wound  Skin/Epidermis Debridement with a total area of 1 sq cm performed by Fredirick Maudlin, MD. With the following instrument(s): Curette to remove Viable and Non-Viable tissue/material. Material removed includes Callus and Skin: Epidermis and after achieving pain control using Lidocaine 5% topical ointment. No specimens were taken. A time out was conducted at 11:20, prior to the start of the procedure. There was no bleeding. The procedure was tolerated well with a pain level of 0 throughout and a pain level of 0 following the procedure. Post Debridement Measurements: 0.4cm length x 0.4cm width x 0.1cm depth; 0.013cm^3 volume. Character of Wound/Ulcer Post Debridement requires further debridement. Severity of Tissue Post Debridement is: Fat layer exposed. Post procedure Diagnosis Wound #1: Same as Pre-Procedure Plan Discharge From [facility] Services: Discharge from [facility]: (In clinic) Topical Lidocaine 5% applied to wound bed - prior to debridement Bathing/ Shower/ Hygiene: May shower and wash wound with soap and water. 04/16/2022: The area where the blister was has healed. There is discolored site in the center of a very thick callus on her first metatarsal head. No obvious opening in the underlying skin. I used a curette to debride the callus down and expose the site where I saw the discoloration. There is no opening in the underlying skin. At this time, the patient has no open wounds and no need for ongoing care in the wound care center. I did suggest that she not go barefoot around the house as she has been, given her status is an uncontrolled diabetic and her peripheral neuropathy. I also advised using a callus or corn pad over her first metatarsal head to prevent friction to the site. She may follow-up on an as-needed basis. Electronic Signature(s) Signed: 04/16/2022 11:58:01 AM By: Fredirick Maudlin MD FACS Entered By: Fredirick Maudlin on 04/16/2022  11:58:01 -------------------------------------------------------------------------------- HxROS Details Patient Name: Date of Service: Grace Amel B. 04/16/2022 10:30 A M Medical Record Number: [id number] Patient Account Number: [id number] Date of Birth/Sex: Treating RN: [birth year] (71 y.o. Marta Lamas Primary Care Provider: Kirtland Bouchard Other Clinician: Referring Provider: Treating Provider/Extender: Lucretia Kern Weeks in Treatment: 0 Information Obtained From Patient Constitutional Symptoms (General Health) Complaints and Symptoms: Negative for: Fatigue; Fever; Chills; Marked Weight Change Eyes Complaints and Symptoms: Positive for: Glasses / Contacts - glasses Negative for: Dry Eyes; Vision Changes Medical History: Positive for: Cataracts Past Medical History Notes: diabetic retinopathy of left eye Respiratory Complaints and Symptoms: Negative for: Chronic or frequent coughs; Shortness of Breath Medical History: Positive for: Sleep Apnea Negative for: Asthma; Chronic Obstructive Pulmonary Disease (COPD) Cardiovascular Complaints and Symptoms: Negative for: Chest pain Medical History: Positive for: Coronary Artery Disease; Hypertension Negative for: Angina; Arrhythmia; Congestive Heart Failure; Myocardial Infarction Gastrointestinal Complaints and Symptoms: Negative for: Frequent diarrhea; Nausea; Vomiting Integumentary (Skin) Complaints and Symptoms: Positive for: Wounds Review of System Notes: LLE Musculoskeletal  Complaints and Symptoms: Negative for: Muscle Pain; Muscle Weakness Medical History: Positive for: Osteoarthritis Neurologic Complaints and Symptoms: Positive for: Numbness/parasthesias - BLE Medical History: Positive for: Neuropathy Psychiatric Complaints and Symptoms: Negative for: Claustrophobia; Suicidal Medical History: Negative for: Anorexia/bulimia; Confinement Anxiety Ear/Nose/Mouth/Throat Medical  History: Negative for: Chronic sinus problems/congestion; Middle ear problems Endocrine Medical History: Positive for: Type II Diabetes - 2008 Past Medical History Notes: diabetic polyneuropathy Time with diabetes: 53 ys Treated with: Oral agents Blood sugar tested every day: No Genitourinary Medical History: Past Medical History Notes: cholelithiasis CKD stage II Oncologic Medical History: Negative for: Received Chemotherapy; Received Radiation HBO Extended History Items Eyes: Cataracts Immunizations Pneumococcal Vaccine: Received Pneumococcal Vaccination: Yes Received Pneumococcal Vaccination On or After 60th Birthday: Yes Implantable Devices None Family and Social History Diabetes: Yes - Mother,Maternal Grandparents,Siblings; Heart Disease: Yes - Mother; Hereditary Spherocytosis: Yes - Father; Hypertension: Yes - Mother; Kidney Disease: No; Lung Disease: Yes - Father; Seizures: No; Stroke: No; Thyroid Problems: Yes - Mother; Tuberculosis: No; Never smoker; Marital Status - Married; Alcohol Use: Never; Drug Use: No History; Caffeine Use: Daily - 3 cups; Financial Concerns: No; Food, Clothing or Shelter Needs: No; Support System Lacking: No; Transportation Concerns: No Physician Affirmation I have reviewed and agree with the above information. Electronic Signature(s) Signed: 04/16/2022 12:13:41 PM By: Fredirick Maudlin MD FACS Signed: 04/16/2022 5:02:02 PM By: Blanche East RN Entered By: Blanche East on 04/16/2022 10:52:05 -------------------------------------------------------------------------------- SuperBill Details Patient Name: Date of Service: Grace [name], Grace [name] 04/16/2022 Medical Record Number: [id number] Patient Account Number: [id number] Date of Birth/Sex: Treating RN: [birth year] (71 y.o. Marta Lamas Primary Care Provider: Kirtland Bouchard Other Clinician: Referring Provider: Treating Provider/Extender: Lucretia Kern Weeks in  Treatment: 0 Diagnosis Coding ICD-10 Codes Code Description L84 Corns and callosities E11.621 Type 2 diabetes mellitus with foot ulcer I73.9 Peripheral vascular disease, unspecified N18.2 Chronic kidney disease, stage 2 (mild) Facility Procedures CPT4 Code: 44920100 Description: WOUND CARE VISIT-LEV 3 NEW PT Modifier: Quantity: 1 CPT4 Code: [id number] Description: 83254 - DEBRIDE WOUND 1ST 20 SQ CM OR < ICD-10 Diagnosis Description L84 Corns and callosities Modifier: Quantity: 1 Physician Procedures : CPT4 Code Description Modifier [id number] WC PHYS LEVEL 3 NEW PT 25 ICD-10 Diagnosis Description L84 Corns and callosities E11.621 Type 2 diabetes mellitus with foot ulcer I73.9 Peripheral vascular disease, unspecified N18.2 Chronic kidney disease, stage  2 (mild) Quantity: 1 : [id number] - WC PHYS DEBR WO ANESTH 20 SQ CM 1 ICD-10 Diagnosis Description L84 Corns and callosities Quantity: Electronic Signature(s) Signed: 04/16/2022 11:58:22 AM By: Fredirick Maudlin MD FACS Entered By: Fredirick Maudlin on 04/16/2022 11:58:22

## 2022-04-23 ENCOUNTER — Encounter: Payer: Self-pay | Admitting: Nurse Practitioner

## 2022-04-23 ENCOUNTER — Ambulatory Visit (INDEPENDENT_AMBULATORY_CARE_PROVIDER_SITE_OTHER): Payer: Medicare Other | Admitting: Nurse Practitioner

## 2022-04-23 VITALS — BP 118/66 | HR 72 | Temp 97.5°F | Ht 64.0 in | Wt 155.0 lb

## 2022-04-23 DIAGNOSIS — N3000 Acute cystitis without hematuria: Secondary | ICD-10-CM | POA: Diagnosis not present

## 2022-04-23 DIAGNOSIS — A419 Sepsis, unspecified organism: Secondary | ICD-10-CM | POA: Diagnosis not present

## 2022-04-23 DIAGNOSIS — L03116 Cellulitis of left lower limb: Secondary | ICD-10-CM

## 2022-04-23 DIAGNOSIS — Z79899 Other long term (current) drug therapy: Secondary | ICD-10-CM

## 2022-04-23 DIAGNOSIS — E875 Hyperkalemia: Secondary | ICD-10-CM

## 2022-04-23 DIAGNOSIS — N182 Chronic kidney disease, stage 2 (mild): Secondary | ICD-10-CM | POA: Diagnosis not present

## 2022-04-23 DIAGNOSIS — K029 Dental caries, unspecified: Secondary | ICD-10-CM | POA: Diagnosis not present

## 2022-04-23 DIAGNOSIS — E1151 Type 2 diabetes mellitus with diabetic peripheral angiopathy without gangrene: Secondary | ICD-10-CM

## 2022-04-23 NOTE — Progress Notes (Signed)
Assessment and Plan:  Grace [name] was seen today for a follow up.  Diagnoses and all order for this visit:  Sepsis without acute organ dysfunction, due to unspecified organism ([place]) Resolved. Procalcitonin slightly elevated last check. Continue to monitor  Cellulitis of left lower extremity Resolved Wound care has discharged. Continue scheduled apt with wound care. Continue to assess for increase in redness, N/V, chills. Notify office for any failure to improve.   Acute cystitis without hematuria Will obtain updated urine considering increase in nocturia.   Dental decay Has been cleared by dentist. Continue to monitor   Diabetes mellitus type 2 with peripheral artery disease ([place]) Keep BG well controlled to help aide in decrease of infection.   CKD (chronic kidney disease) stage 2, GFR 60-89 ml/min Stay well hydrated. Avoid high salt foods. Avoid NSAIDS. Keep BP and BG well controlled.   Take medications as prescribed.  Medication management All medications discussed and reviewed in full. All questions and concerns regarding medications addressed.     Serum Potassium Elevated Monitor CMP  Orders Placed This Encounter  Procedures   Urine Culture   Urinalysis, Routine w reflex microscopic   CBC with Differential/Platelet   COMPLETE METABOLIC PANEL WITH GFR    Notify office for further evaluation and treatment, questions or concerns if s/s fail to improve. The risks and benefits of my recommendations, as well as other treatment options were discussed with the patient today. Questions were answered.  Further disposition pending results of labs. Discussed med's effects and SE's.    Over 20 minutes of exam, counseling, chart review, and critical decision making was performed.   Future Appointments  Date Time Provider [place]  04/24/2022  3:30 PM Rankin, Clent Demark, MD [facility]DE[facility] None  04/29/2022  3:30 PM Rankin, Clent Demark, MD RDE-RDE None  06/11/2022  10:00 AM Alycia Rossetti, NP [facility]GAA[facility] None  06/26/2022 11:00 AM Alycia Rossetti, NP [facility] None  01/22/2023  8:45 AM Suzzanne Cloud, NP GNA-GNA None    ------------------------------------------------------------------------------------------------------------------   HPI BP 118/66   Pulse 72   Temp (!) 97.5 F (36.4 C)   Ht 5' 4"  (1.626 m)   Wt 155 lb (70.3 kg)   SpO2 97%   BMI 26.61 kg/m    71 y.o.female presents for evaluation and management of continued dysuria, and lower abdominal pain, along with vaginal discharge and increase in nocturia.  She was recently discharged from hospital for sepsis secondary to left foot cellulitis and UTI. She has followed with wound care center for left foot cellulities and has now been cleared.  Her UA was WNL last OV but she reports increase in nocturia.   She denies fever, chills, N/V.  Past Medical History:  Diagnosis Date   Abnormality of gait 07/28/2013   Chronic kidney disease    Diabetes (Grace [name])    Diabetic peripheral neuropathy (HCC)    Diabetic retinopathy (Grace [name])    Difficult intubation    Foot drop, bilateral 07/31/2014   GERD (gastroesophageal reflux disease)    Peripheral edema    Polyneuropathy in diabetes(357.2) 07/28/2013   Sleep apnea    uses cpap   Tongue mass      Allergies  Allergen Reactions   Gabapentin Swelling    Swelling in legs and feet    Current Outpatient Medications on File Prior to Visit  Medication Sig   acetaminophen (TYLENOL) 500 MG tablet Take 1,500 mg by mouth every 8 (eight) hours as needed for moderate pain.  blood glucose meter kit and supplies KIT Dispense based on patient and insurance preference. Use 3 times daily as directed.   Cholecalciferol (DIALYVITE VITAMIN D 5000) 125 MCG (5000 UT) capsule Take 5,000 Units by mouth daily.   DULoxetine (CYMBALTA) 30 MG capsule Take 1 capsule (30 mg total) by mouth daily.   glimepiride (AMARYL) 2 MG tablet TAKE 1 TABLET 2 X /DAY WITH MEALS FOR  DIABETES   Magnesium 250 MG TABS Take 500 mg by mouth 2 (two) times daily.   metFORMIN (GLUCOPHAGE-XR) 500 MG 24 hr tablet TAKE 2 TABLETS BY MOUTH TWICE DAILY FOR DIABETES (Patient taking differently: Take 1,000 mg by mouth 2 (two) times daily.)   Omega-3 350 MG CPDR Take 350 mg by mouth 2 (two) times daily.   zinc gluconate 50 MG tablet Take 50 mg by mouth daily.   fluconazole (DIFLUCAN) 150 MG tablet Take 1 tablet (150 mg) at the sign of symptoms.  If not resolved after 72 hours may take additional 150 mg dosage. (Patient not taking: Reported on 04/23/2022)   No current facility-administered medications on file prior to visit.    ROS: all negative except what is noted in the HPI.   Physical Exam:  BP 118/66   Pulse 72   Temp (!) 97.5 F (36.4 C)   Ht 5' 4"  (1.626 m)   Wt 155 lb (70.3 kg)   SpO2 97%   BMI 26.61 kg/m   General Appearance: NAD.  Awake, conversant and cooperative. Eyes: PERRLA, EOMs intact.  Sclera white.  Conjunctiva without erythema. Sinuses: No frontal/maxillary tenderness.  No nasal discharge. Nares patent.  ENT/Mouth: Ext aud canals clear.  Bilateral TMs w/DOL and without erythema or bulging. Hearing intact.  Posterior pharynx without swelling or exudate.  Tonsils without swelling or erythema.  Neck: Supple.  No masses, nodules or thyromegaly. Respiratory: Effort is regular with non-labored breathing. Breath sounds are equal bilaterally without rales, rhonchi, wheezing or stridor.  Cardio: RRR with no MRGs. Brisk peripheral pulses without edema.  Abdomen: Active BS in all four quadrants.  Soft and non-tender without guarding, rebound tenderness, hernias or masses. Lymphatics: Non tender without lymphadenopathy.  Musculoskeletal: Full ROM, 5/5 strength, normal ambulation.  No clubbing or cyanosis. Skin: LLE with no erythema mild edema. No warmth.   Neuro: CN II-XII grossly normal. Normal muscle tone without cerebellar symptoms and intact sensation.   Psych: AO X  3,  appropriate mood and affect, insight and judgment.     Darrol Jump, NP 2:39 PM [facility]

## 2022-04-24 ENCOUNTER — Ambulatory Visit (INDEPENDENT_AMBULATORY_CARE_PROVIDER_SITE_OTHER): Payer: Medicare Other | Admitting: Ophthalmology

## 2022-04-24 ENCOUNTER — Encounter (INDEPENDENT_AMBULATORY_CARE_PROVIDER_SITE_OTHER): Payer: Self-pay | Admitting: Ophthalmology

## 2022-04-24 DIAGNOSIS — E113511 Type 2 diabetes mellitus with proliferative diabetic retinopathy with macular edema, right eye: Secondary | ICD-10-CM

## 2022-04-24 DIAGNOSIS — E113512 Type 2 diabetes mellitus with proliferative diabetic retinopathy with macular edema, left eye: Secondary | ICD-10-CM

## 2022-04-24 LAB — URINALYSIS, ROUTINE W REFLEX MICROSCOPIC
Bacteria, UA: NONE SEEN /HPF
Bilirubin Urine: NEGATIVE
Glucose, UA: NEGATIVE
Hgb urine dipstick: NEGATIVE
Hyaline Cast: NONE SEEN /LPF
Ketones, ur: NEGATIVE
Nitrite: NEGATIVE
Protein, ur: NEGATIVE
RBC / HPF: NONE SEEN /HPF (ref 0–2)
Specific Gravity, Urine: 1.012 (ref 1.001–1.035)
pH: 6 (ref 5.0–8.0)

## 2022-04-24 LAB — COMPLETE METABOLIC PANEL WITH GFR
AG Ratio: 1.7 (calc) (ref 1.0–2.5)
ALT: 12 U/L (ref 6–29)
AST: 14 U/L (ref 10–35)
Albumin: 3.9 g/dL (ref 3.6–5.1)
Alkaline phosphatase (APISO): 56 U/L (ref 37–153)
BUN/Creatinine Ratio: 24 (calc) — ABNORMAL HIGH (ref 6–22)
BUN: 24 mg/dL (ref 7–25)
CO2: 26 mmol/L (ref 20–32)
Calcium: 10.2 mg/dL (ref 8.6–10.4)
Chloride: 100 mmol/L (ref 98–110)
Creat: 1.02 mg/dL — ABNORMAL HIGH (ref 0.60–1.00)
Globulin: 2.3 g/dL (calc) (ref 1.9–3.7)
Glucose, Bld: 99 mg/dL (ref 65–99)
Potassium: 5 mmol/L (ref 3.5–5.3)
Sodium: 139 mmol/L (ref 135–146)
Total Bilirubin: 0.2 mg/dL (ref 0.2–1.2)
Total Protein: 6.2 g/dL (ref 6.1–8.1)
eGFR: 59 mL/min/{1.73_m2} — ABNORMAL LOW (ref >=60)

## 2022-04-24 LAB — CBC WITH DIFFERENTIAL/PLATELET
Absolute Monocytes: 593 cells/uL (ref 200–950)
Basophils Absolute: 53 cells/uL (ref 0–200)
Basophils Relative: 0.7 %
Eosinophils Absolute: 368 cells/uL (ref 15–500)
Eosinophils Relative: 4.9 %
HCT: 35.6 % (ref 35.0–45.0)
Hemoglobin: 12.8 g/dL (ref 11.7–15.5)
Lymphs Abs: 2213 cells/uL (ref 850–3900)
MCH: 34.1 pg — ABNORMAL HIGH (ref 27.0–33.0)
MCHC: 36 g/dL (ref 32.0–36.0)
MCV: 94.9 fL (ref 80.0–100.0)
MPV: 8.9 fL (ref 7.5–12.5)
Monocytes Relative: 7.9 %
Neutro Abs: 4275 cells/uL (ref 1500–7800)
Neutrophils Relative %: 57 %
Platelets: 469 10*3/uL — ABNORMAL HIGH (ref 140–400)
RBC: 3.75 10*6/uL — ABNORMAL LOW (ref 3.80–5.10)
RDW: 13.5 % (ref 11.0–15.0)
Total Lymphocyte: 29.5 %
WBC: 7.5 10*3/uL (ref 3.8–10.8)

## 2022-04-24 LAB — URINE CULTURE
MICRO NUMBER:: 13880067
SPECIMEN QUALITY:: ADEQUATE

## 2022-04-24 MED ORDER — AFLIBERCEPT 2MG/0.05ML IZ SOLN FOR KALEIDOSCOPE
2.0000 mg | INTRAVITREAL | Status: AC | PRN
  Administered 2022-04-24: 2 mg via INTRAVITREAL

## 2022-04-24 NOTE — Progress Notes (Signed)
04/24/2022     CHIEF COMPLAINT Patient presents for  Chief Complaint  Patient presents with   Diabetic Retinopathy with Macular Edema      HISTORY OF PRESENT ILLNESS: Grace [name] is a 71 y.o. female who presents to the clinic today for:   HPI   6 weeks dilate os eylea oct Pt states her vision has not been stable Pt denies any new floaters or FOL Pt states her vision is dim in both eyes Last edited by Morene Rankins, CMA on 04/24/2022  3:53 PM.      Referring physician: Unk Pinto, MD [address] [place],  [place] [address]  HISTORICAL INFORMATION:   Selected notes from the [facility]    Lab Results  Component Value Date   HGBA1C 7.8 (H) 03/25/2022     CURRENT MEDICATIONS: No current outpatient medications on file. (Ophthalmic Drugs)   No current facility-administered medications for this visit. (Ophthalmic Drugs)   Current Outpatient Medications (Other)  Medication Sig   acetaminophen (TYLENOL) 500 MG tablet Take 1,500 mg by mouth every 8 (eight) hours as needed for moderate pain.   blood glucose meter kit and supplies KIT Dispense based on patient and insurance preference. Use 3 times daily as directed.   Cholecalciferol (DIALYVITE VITAMIN D 5000) 125 MCG (5000 UT) capsule Take 5,000 Units by mouth daily.   DULoxetine (CYMBALTA) 30 MG capsule Take 1 capsule (30 mg total) by mouth daily.   fluconazole (DIFLUCAN) 150 MG tablet Take 1 tablet (150 mg) at the sign of symptoms.  If not resolved after 72 hours may take additional 150 mg dosage. (Patient not taking: Reported on 04/23/2022)   glimepiride (AMARYL) 2 MG tablet TAKE 1 TABLET 2 X /DAY WITH MEALS FOR DIABETES   Magnesium 250 MG TABS Take 500 mg by mouth 2 (two) times daily.   metFORMIN (GLUCOPHAGE-XR) 500 MG 24 hr tablet TAKE 2 TABLETS BY MOUTH TWICE DAILY FOR DIABETES (Patient taking differently: Take 1,000 mg by mouth 2 (two) times daily.)   Omega-3 350 MG CPDR Take 350  mg by mouth 2 (two) times daily.   zinc gluconate 50 MG tablet Take 50 mg by mouth daily.   No current facility-administered medications for this visit. (Other)      REVIEW OF SYSTEMS: ROS   Negative for: Constitutional, Gastrointestinal, Neurological, Skin, Genitourinary, Musculoskeletal, HENT, Endocrine, Cardiovascular, Eyes, Respiratory, Psychiatric, Allergic/Imm, Heme/Lymph Last edited by Orene Desanctis D, CMA on 04/24/2022  3:53 PM.       ALLERGIES Allergies  Allergen Reactions   Gabapentin Swelling    Swelling in legs and feet    PAST MEDICAL HISTORY Past Medical History:  Diagnosis Date   Abnormality of gait 07/28/2013   Chronic kidney disease    Diabetes ([place])    Diabetic peripheral neuropathy (HCC)    Diabetic retinopathy ([place])    Difficult intubation    Foot drop, bilateral 07/31/2014   GERD (gastroesophageal reflux disease)    Peripheral edema    Polyneuropathy in diabetes(357.2) 07/28/2013   Sleep apnea    uses cpap   Tongue mass    Past Surgical History:  Procedure Laterality Date   ABDOMINAL HYSTERECTOMY     ANKLE FRACTURE SURGERY     left   CATARACT EXTRACTION W/PHACO Right 02/21/2014   Dr. Herbert Deaner   CATARACT EXTRACTION W/PHACO Left 04/10/2014   Dr. Herbert Deaner   CATARACT EXTRACTION, BILATERAL     DIRECT LARYNGOSCOPY Left 08/02/2021  Procedure: DIRECT LARYNGOSCOPY WITH TONGUE BIOPSY;  Surgeon: Leta Baptist, MD;  Location: [facility];  Service: ENT;  Laterality: Left;   HIP SURGERY     right   MASS BIOPSY Left 09/06/2021   Procedure: NECK MASS BIOPSY;  Surgeon: Leta Baptist, MD;  Location: [facility];  Service: ENT;  Laterality: Left;   TONSILLECTOMY      FAMILY HISTORY Family History  Problem Relation Age of Onset   Diabetes Mother    Breast cancer Maternal Aunt    Diabetes Brother    Neuropathy Neg Hx     SOCIAL HISTORY Social History   Tobacco Use   Smoking status: Never   Smokeless tobacco: Never  Substance Use  Topics   Alcohol use: No   Drug use: No         OPHTHALMIC EXAM:  Base Eye Exam     Visual Acuity (ETDRS)       Right Left   Dist cc 20/25 -2 20/50    Correction: Glasses         Tonometry (Tonopen, 3:58 PM)       Right Left   Pressure 13 10         Visual Fields       Left Right     Full         Extraocular Movement       Right Left    Ortho Ortho    -- -- --  --  --  -- -- --   -- -- --  --  --  -- -- --           Neuro/Psych     Oriented x3: Yes   Mood/Affect: Normal         Dilation     Left eye: 2.5% Phenylephrine, 1.0% Mydriacyl @ 3:55 PM           Slit Lamp and Fundus Exam     External Exam       Right Left   External Normal Normal         Slit Lamp Exam       Right Left   Lids/Lashes Normal Normal   Conjunctiva/Sclera White and quiet White and quiet   Cornea Stromal fine reticulated and punctate opacities, ?  Lattice degeneration, no epithelial involvement Stromal fine reticulated and punctate opacities, ?  Lattice degeneration, no epithelial involvement   Anterior Chamber Deep and quiet Deep and quiet   Iris Round and reactive Round and reactive   Lens Posterior chamber intraocular lens Posterior chamber intraocular lens   Anterior Vitreous Normal Normal         Fundus Exam       Right Left   Posterior Vitreous  Posterior vitreous detachment   Disc  Normal   C/D Ratio  0.25   Macula  Microaneurysms, Macular thickening, Mild clinically significant macular edema   Vessels  Severe nonproliferative diabetic retinopathy with retinal nonperfusion   Periphery  Good peripheral PRP            IMAGING AND PROCEDURES  Imaging and Procedures for 04/24/22  OCT, Retina - OU - Both Eyes       Right Eye Quality was good. Scan locations included subfoveal. Central Foveal Thickness: 296. Progression has improved. Findings include abnormal foveal contour, epiretinal membrane.   Left Eye Quality was good.  Scan locations included subfoveal. Central Foveal Thickness: 312. Progression has worsened. Findings include abnormal foveal  contour.   Notes OS today at 6 weeks post Eylea.  CSME remains but improved at 6-week interval post Eylea and now after recent injection we will continue to follow left eye as scheduled  OD stable, improved CSME and maintained today at 6-week interval.  Need injection today at 6 weeks as no recurrence of CSME has developed.  Repeat evaluation of right again in 6 weeks      Intravitreal Injection, Pharmacologic Agent - OS - Left Eye       Time Out 04/24/2022. 4:52 PM. Confirmed correct patient, procedure, site, and patient consented.   Anesthesia Topical anesthesia was used. Anesthetic medications included Lidocaine 4%.   Procedure Preparation included 5% betadine to ocular surface, 10% betadine to eyelids, Tobramycin 0.3%. A 30 gauge needle was used.   Injection: 2 mg aflibercept 2 MG/0.05ML   Route: Intravitreal, Site: Left Eye   NDC: [id number], Lot: [id number], Expiration date: 04/20/2023, Waste: 0 mL   Post-op Post injection exam found visual acuity of at least counting fingers. The patient tolerated the procedure well. There were no complications. The patient received written and verbal post procedure care education. Post injection medications included ocuflox.              ASSESSMENT/PLAN:  Proliferative diabetic retinopathy of left eye with macular edema associated with type 2 diabetes mellitus (HCC) CSME OS vastly improved now currently in 6-week interval post Eylea.  We will repeat injection OS.  Diabetic macular edema of right eye with proliferative retinopathy associated with type 2 diabetes mellitus ([place]) Currently at 5 weeks postinjection follow-up as scheduled     ICD-10-CM   1. Proliferative diabetic retinopathy of left eye with macular edema associated with type 2 diabetes mellitus (HCC)  Y92.4462 OCT, Retina - OU - Both Eyes     Intravitreal Injection, Pharmacologic Agent - OS - Left Eye    aflibercept (EYLEA) SOLN 2 mg    2. Diabetic macular edema of right eye with proliferative retinopathy associated with type 2 diabetes mellitus ([place])  E11.3511       1.  OS much improved CSME today.  At 6-week interval post Eylea.  We will repeat injection today and decrease treatment burden will extend interval now in 8 weeks  2.  OD doing very well as all  3.  Continues off of CPAP currently  Ophthalmic Meds Ordered this visit:  Meds ordered this encounter  Medications   aflibercept (EYLEA) SOLN 2 mg       Return in about 8 weeks (around 06/19/2022) for dilate, OS, EYLEA OCT,, and follow-up OD as scheduled.  There are no Patient Instructions on file for this visit.   Explained the diagnoses, plan, and follow up with the patient and they expressed understanding.  Patient expressed understanding of the importance of proper follow up care.   Clent Demark Makaylia Hewett M.D. Diseases & Surgery of the Retina and Vitreous Retina & Diabetic [place] 04/24/22     Abbreviations: M myopia (nearsighted); A astigmatism; H hyperopia (farsighted); P presbyopia; Mrx spectacle prescription;  CTL contact lenses; OD right eye; OS left eye; OU both eyes  XT exotropia; ET esotropia; PEK punctate epithelial keratitis; PEE punctate epithelial erosions; DES dry eye syndrome; MGD meibomian gland dysfunction; ATs artificial tears; PFAT's preservative free artificial tears; [place] nuclear sclerotic cataract; PSC posterior subcapsular cataract; ERM epi-retinal membrane; PVD posterior vitreous detachment; RD retinal detachment; DM diabetes mellitus; DR diabetic retinopathy; NPDR non-proliferative diabetic retinopathy; PDR proliferative diabetic retinopathy; CSME clinically  significant macular edema; DME diabetic macular edema; dbh dot blot hemorrhages; CWS cotton wool spot; POAG primary open angle glaucoma; C/D cup-to-disc ratio; HVF humphrey visual field;  GVF goldmann visual field; OCT optical coherence tomography; IOP intraocular pressure; BRVO Branch retinal vein occlusion; CRVO central retinal vein occlusion; CRAO central retinal artery occlusion; BRAO branch retinal artery occlusion; RT retinal tear; SB scleral buckle; PPV pars plana vitrectomy; VH Vitreous hemorrhage; PRP panretinal laser photocoagulation; IVK intravitreal kenalog; VMT vitreomacular traction; MH Macular hole;  NVD neovascularization of the disc; NVE neovascularization elsewhere; AREDS age related eye disease study; ARMD age related macular degeneration; POAG primary open angle glaucoma; EBMD epithelial/anterior basement membrane dystrophy; ACIOL anterior chamber intraocular lens; IOL intraocular lens; PCIOL posterior chamber intraocular lens; Phaco/IOL phacoemulsification with intraocular lens placement; Abimelec Grochowski photorefractive keratectomy; LASIK laser assisted in situ keratomileusis; HTN hypertension; DM diabetes mellitus; COPD chronic obstructive pulmonary disease

## 2022-04-24 NOTE — Assessment & Plan Note (Signed)
CSME OS vastly improved now currently in 6-week interval post Eylea.  We will repeat injection OS.

## 2022-04-24 NOTE — Assessment & Plan Note (Signed)
Currently at 5 weeks postinjection follow-up as scheduled

## 2022-04-29 ENCOUNTER — Encounter (INDEPENDENT_AMBULATORY_CARE_PROVIDER_SITE_OTHER): Payer: Self-pay | Admitting: Ophthalmology

## 2022-04-29 ENCOUNTER — Ambulatory Visit (INDEPENDENT_AMBULATORY_CARE_PROVIDER_SITE_OTHER): Payer: Medicare Other | Admitting: Ophthalmology

## 2022-04-29 DIAGNOSIS — H35371 Puckering of macula, right eye: Secondary | ICD-10-CM

## 2022-04-29 DIAGNOSIS — E113511 Type 2 diabetes mellitus with proliferative diabetic retinopathy with macular edema, right eye: Secondary | ICD-10-CM | POA: Diagnosis not present

## 2022-04-29 DIAGNOSIS — H35372 Puckering of macula, left eye: Secondary | ICD-10-CM

## 2022-04-29 DIAGNOSIS — G4733 Obstructive sleep apnea (adult) (pediatric): Secondary | ICD-10-CM

## 2022-04-29 DIAGNOSIS — Z9989 Dependence on other enabling machines and devices: Secondary | ICD-10-CM | POA: Diagnosis not present

## 2022-04-29 DIAGNOSIS — E113512 Type 2 diabetes mellitus with proliferative diabetic retinopathy with macular edema, left eye: Secondary | ICD-10-CM | POA: Diagnosis not present

## 2022-04-29 MED ORDER — AFLIBERCEPT 2MG/0.05ML IZ SOLN FOR KALEIDOSCOPE
2.0000 mg | INTRAVITREAL | Status: AC | PRN
  Administered 2022-04-29: 2 mg via INTRAVITREAL

## 2022-04-29 NOTE — Assessment & Plan Note (Signed)
Not on CPAP now

## 2022-04-29 NOTE — Assessment & Plan Note (Signed)
Minor no impact on acuity 

## 2022-04-29 NOTE — Assessment & Plan Note (Signed)
CSME improved and staying improved now at 6-week interval on therapy but also concomitantly using a not nitric oxide booster per patient report for kidney perfusion

## 2022-04-29 NOTE — Progress Notes (Signed)
04/29/2022     CHIEF COMPLAINT Patient presents for  Chief Complaint  Patient presents with   Diabetic Retinopathy with Macular Edema      HISTORY OF PRESENT ILLNESS: Grace [name] is a 71 y.o. female who presents to the clinic today for:   HPI     Diabetic retinopathy of left eye  6 weeks fu od eylea oct Pt states her vision has been stable Pt denies any new floaters or FOL Pt complains of vision being dim  Last edited by Hurman Horn, MD on 04/29/2022  4:34 PM.      Referring physician: Unk Pinto, MD [address] [place],  [place] [address]  HISTORICAL INFORMATION:   Selected notes from the MEDICAL RECORD NUMBER    Lab Results  Component Value Date   HGBA1C 7.8 (H) 03/25/2022     CURRENT MEDICATIONS: No current outpatient medications on file. (Ophthalmic Drugs)   No current facility-administered medications for this visit. (Ophthalmic Drugs)   Current Outpatient Medications (Other)  Medication Sig   acetaminophen (TYLENOL) 500 MG tablet Take 1,500 mg by mouth every 8 (eight) hours as needed for moderate pain.   blood glucose meter kit and supplies KIT Dispense based on patient and insurance preference. Use 3 times daily as directed.   Cholecalciferol (DIALYVITE VITAMIN D 5000) 125 MCG (5000 UT) capsule Take 5,000 Units by mouth daily.   DULoxetine (CYMBALTA) 30 MG capsule Take 1 capsule (30 mg total) by mouth daily.   fluconazole (DIFLUCAN) 150 MG tablet Take 1 tablet (150 mg) at the sign of symptoms.  If not resolved after 72 hours may take additional 150 mg dosage. (Patient not taking: Reported on 04/23/2022)   glimepiride (AMARYL) 2 MG tablet TAKE 1 TABLET 2 X /DAY WITH MEALS FOR DIABETES   Magnesium 250 MG TABS Take 500 mg by mouth 2 (two) times daily.   metFORMIN (GLUCOPHAGE-XR) 500 MG 24 hr tablet TAKE 2 TABLETS BY MOUTH TWICE DAILY FOR DIABETES (Patient taking differently: Take 1,000 mg by mouth 2 (two) times daily.)    Omega-3 350 MG CPDR Take 350 mg by mouth 2 (two) times daily.   zinc gluconate 50 MG tablet Take 50 mg by mouth daily.   No current facility-administered medications for this visit. (Other)      REVIEW OF SYSTEMS: ROS   Negative for: Constitutional, Gastrointestinal, Neurological, Skin, Genitourinary, Musculoskeletal, HENT, Endocrine, Cardiovascular, Eyes, Respiratory, Psychiatric, Allergic/Imm, Heme/Lymph Last edited by Orene Desanctis D, CMA on 04/29/2022  3:47 PM.       ALLERGIES Allergies  Allergen Reactions   Gabapentin Swelling    Swelling in legs and feet    PAST MEDICAL HISTORY Past Medical History:  Diagnosis Date   Abnormality of gait 07/28/2013   Chronic kidney disease    Diabetes ([place])    Diabetic peripheral neuropathy (HCC)    Diabetic retinopathy ([place])    Difficult intubation    Foot drop, bilateral 07/31/2014   GERD (gastroesophageal reflux disease)    Peripheral edema    Polyneuropathy in diabetes(357.2) 07/28/2013   Sleep apnea    uses cpap   Tongue mass    Past Surgical History:  Procedure Laterality Date   ABDOMINAL HYSTERECTOMY     ANKLE FRACTURE SURGERY     left   CATARACT EXTRACTION W/PHACO Right 02/21/2014   Dr. Herbert Deaner   CATARACT EXTRACTION W/PHACO Left 04/10/2014   Dr. Herbert Deaner   CATARACT EXTRACTION, BILATERAL     DIRECT  LARYNGOSCOPY Left 08/02/2021   Procedure: DIRECT LARYNGOSCOPY WITH TONGUE BIOPSY;  Surgeon: Leta Baptist, MD;  Location: [facility];  Service: ENT;  Laterality: Left;   HIP SURGERY     right   MASS BIOPSY Left 09/06/2021   Procedure: NECK MASS BIOPSY;  Surgeon: Leta Baptist, MD;  Location: [facility];  Service: ENT;  Laterality: Left;   TONSILLECTOMY      FAMILY HISTORY Family History  Problem Relation Age of Onset   Diabetes Mother    Breast cancer Maternal Aunt    Diabetes Brother    Neuropathy Neg Hx     SOCIAL HISTORY Social History   Tobacco Use   Smoking status: Never   Smokeless  tobacco: Never  Substance Use Topics   Alcohol use: No   Drug use: No         OPHTHALMIC EXAM:  Base Eye Exam     Visual Acuity (ETDRS)       Right Left   Dist cc 20/30 +3 20/50 -1    Correction: Glasses         Tonometry (Tonopen, 3:53 PM)       Right Left   Pressure 11 10         Pupils       Pupils APD   Right PERRL None   Left PERRL None         Visual Fields       Left Right    Full Full         Extraocular Movement       Right Left    Ortho Ortho    -- -- --  --  --  -- -- --   -- -- --  --  --  -- -- --           Neuro/Psych     Oriented x3: Yes   Mood/Affect: Normal         Dilation     Right eye: 2.5% Phenylephrine, 1.0% Mydriacyl @ 3:50 PM           Slit Lamp and Fundus Exam     External Exam       Right Left   External Normal Normal         Slit Lamp Exam       Right Left   Lids/Lashes Normal Normal   Conjunctiva/Sclera White and quiet White and quiet   Cornea Stromal fine reticulated and punctate opacities, ?  Lattice degeneration, no epithelial involvement Stromal fine reticulated and punctate opacities, ?  Lattice degeneration, no epithelial involvement   Anterior Chamber Deep and quiet Deep and quiet   Iris Round and reactive Round and reactive   Lens Posterior chamber intraocular lens Posterior chamber intraocular lens   Anterior Vitreous Normal Normal         Fundus Exam       Right Left   Posterior Vitreous Posterior vitreous detachment    Disc Normal    C/D Ratio 0.3    Macula Epiretinal membrane, good focal laser, Macular thickening minor    Vessels PDR-quiet    Periphery good PRP, room for anterior PRP present temporally and nasally and superiorly             IMAGING AND PROCEDURES  Imaging and Procedures for 04/29/22  OCT, Retina - OU - Both Eyes       Right Eye Quality was good. Scan locations included subfoveal. Central  Foveal Thickness: 274. Progression has improved.  Findings include abnormal foveal contour, epiretinal membrane.   Left Eye Quality was good. Scan locations included subfoveal. Central Foveal Thickness: 274. Progression has worsened. Findings include abnormal foveal contour.   Notes OS today at 6 weeks post Eylea.  CSME remains but improved at 6-week interval post Eylea and now after recent injection we will continue to follow left eye as scheduled  OD stable, improved CSME and maintained today at 6-week interval.  Need injection today at 6 weeks as no recurrence of CSME has developed.  Repeat evaluation of right again in 8 weeks      Intravitreal Injection, Pharmacologic Agent - OD - Right Eye       Time Out 04/29/2022. 4:36 PM. Confirmed correct patient, procedure, site, and patient consented.   Anesthesia Topical anesthesia was used. Anesthetic medications included Lidocaine 4%.   Procedure Preparation included 5% betadine to ocular surface, 10% betadine to eyelids, Tobramycin 0.3%. A 30 gauge needle was used.   Injection: 2 mg aflibercept 2 MG/0.05ML   Route: Intravitreal, Site: Right Eye   NDC: [id number], Lot: [id number], Expiration date: 05/19/2023, Waste: 0 mL   Post-op Post injection exam found visual acuity of at least counting fingers. The patient tolerated the procedure well. There were no complications. The patient received written and verbal post procedure care education. Post injection medications included ocuflox.              ASSESSMENT/PLAN:  OSA on CPAP Not on CPAP now  Diabetic macular edema of right eye with proliferative retinopathy associated with type 2 diabetes mellitus (HCC) CSME improved and staying improved now at 6-week interval on therapy but also concomitantly using a not nitric oxide booster per patient report for kidney perfusion  Proliferative diabetic retinopathy of left eye with macular edema associated with type 2 diabetes mellitus ([place]) Will continue to monitor closely may be  able to extend interval examination as patient on a nitric oxide booster medication for perfusion enhancement of the kidneys  Epiretinal membrane, left eye Minor no impact on acuity  Right epiretinal membrane Minor no impact on acuity     ICD-10-CM   1. Diabetic macular edema of right eye with proliferative retinopathy associated with type 2 diabetes mellitus (HCC)  G18.2993 Intravitreal Injection, Pharmacologic Agent - OD - Right Eye    aflibercept (EYLEA) SOLN 2 mg    2. Proliferative diabetic retinopathy of left eye with macular edema associated with type 2 diabetes mellitus (HCC)  Z16.9678 OCT, Retina - OU - Both Eyes    3. OSA on CPAP  G47.33    Z99.89     4. Epiretinal membrane, left eye  H35.372     5. Right epiretinal membrane  H35.371       1.  OD CSME remaining nice and stable and not recurrent now at 6 weeks.  Repeat injection today and extend interval examination OD to next 8 weeks  2.  3.  Ophthalmic Meds Ordered this visit:  Meds ordered this encounter  Medications   aflibercept (EYLEA) SOLN 2 mg       Return in about 8 weeks (around 06/24/2022) for dilate, OD, EYLEA OCT.  There are no Patient Instructions on file for this visit.   Explained the diagnoses, plan, and follow up with the patient and they expressed understanding.  Patient expressed understanding of the importance of proper follow up care.   Clent Demark Lavon Bothwell M.D. Diseases & Surgery of the Retina and  Vitreous Retina & Diabetic [place] 04/29/22     Abbreviations: M myopia (nearsighted); A astigmatism; H hyperopia (farsighted); P presbyopia; Mrx spectacle prescription;  CTL contact lenses; OD right eye; OS left eye; OU both eyes  XT exotropia; ET esotropia; PEK punctate epithelial keratitis; PEE punctate epithelial erosions; DES dry eye syndrome; MGD meibomian gland dysfunction; ATs artificial tears; PFAT's preservative free artificial tears; [place] nuclear sclerotic cataract; PSC posterior  subcapsular cataract; ERM epi-retinal membrane; PVD posterior vitreous detachment; RD retinal detachment; DM diabetes mellitus; DR diabetic retinopathy; NPDR non-proliferative diabetic retinopathy; PDR proliferative diabetic retinopathy; CSME clinically significant macular edema; DME diabetic macular edema; dbh dot blot hemorrhages; CWS cotton wool spot; POAG primary open angle glaucoma; C/D cup-to-disc ratio; HVF humphrey visual field; GVF goldmann visual field; OCT optical coherence tomography; IOP intraocular pressure; BRVO Branch retinal vein occlusion; CRVO central retinal vein occlusion; CRAO central retinal artery occlusion; BRAO branch retinal artery occlusion; RT retinal tear; SB scleral buckle; PPV pars plana vitrectomy; VH Vitreous hemorrhage; PRP panretinal laser photocoagulation; IVK intravitreal kenalog; VMT vitreomacular traction; MH Macular hole;  NVD neovascularization of the disc; NVE neovascularization elsewhere; AREDS age related eye disease study; ARMD age related macular degeneration; POAG primary open angle glaucoma; EBMD epithelial/anterior basement membrane dystrophy; ACIOL anterior chamber intraocular lens; IOL intraocular lens; PCIOL posterior chamber intraocular lens; Phaco/IOL phacoemulsification with intraocular lens placement; [place] photorefractive keratectomy; LASIK laser assisted in situ keratomileusis; HTN hypertension; DM diabetes mellitus; COPD chronic obstructive pulmonary disease

## 2022-04-29 NOTE — Assessment & Plan Note (Signed)
Will continue to monitor closely may be able to extend interval examination as patient on a nitric oxide booster medication for perfusion enhancement of the kidneys

## 2022-06-09 NOTE — Progress Notes (Signed)
 Complete Physical  Assessment and Plan:  Free was seen today for annual exam.  Diagnoses and all orders for this visit:  Hyperlipidemia associated with type 2 diabetes mellitus (HCC) -     COMPLETE METABOLIC PANEL WITH GFR -     Lipid panel -     TSH Continue diet and exercise  Type 2 diabetes mellitus with stage 2 chronic kidney disease, without long-term current use of insulin (HCC) -     Urinalysis, Routine w reflex microscopic -     Microalbumin / creatinine urine ratio Continue medications, diet and exercise Discussed adding small dose of losartan for preserving kidney function, pt will consider  Diabetic polyneuropathy associated with diabetes mellitus due to underlying condition (HCC) Continue to control blood sugars Continue medications: Continue diet and exercise.  Perform daily foot/skin check, notify office of any concerning changes.  Check A1C   Type 2 diabetes mellitus with hyperlipidemia (HCC) -     Hemoglobin A1c Continue medications, diet and exercise  Diabetes mellitus type 2 with peripheral artery disease (HCC) -     Hemoglobin A1c Continue medications, diet and exercise  Diabetes mellitus with macular edema (HCC)  Continue to follow with Dr. Luciana Axe, monitor and control blood sugars  Diabetic retinopathy of both eyes with macular edema associated with diabetes mellitus due to underlying condition, unspecified retinopathy severity (HCC)  Continue to follow with Dr. Luciana Axe, monitor and control blood sugars  Essential hypertension -     CBC with Differential/Platelet - Currently on no medications, continue DASH diet, exercise and monitor at home. Call if greater than 130/80.    Discuss addition of Losartan to lower BP and preserve kidney function, pt will consider  - AAA  Vitamin D deficiency -     VITAMIN D 25 Hydroxy (Vit-D Deficiency, Fractures) Currently on no Vit D supplementation  Medication management -     Magnesium -     Urinalysis,  Routine w reflex microscopic -     Microalbumin / creatinine urine ratio -     EKG 12-Lead - AAA  Toenail deformity/ callus of left foot Refer to dermatology for evaluation.  Constipation Start to increase fiber and water Begin Colace daily and Miralax every other day If no improvement notify the office  Dysuria - Routine UA - Urine culture - Will treat pending results  Flu Vaccine Need - High dose flu vaccine given   Discussed med's effects and SE's. Screening labs and tests as requested with regular follow-up as recommended. Over 40 minutes of exam, counseling, chart review, and complex, high level critical decision making was performed this visit.  Future Appointments  Date Time Provider Department Center  06/19/2022  3:45 PM Rankin, Alford Highland, MD [facility] None  06/24/2022  3:30 PM Rankin, Alford Highland, MD [facility] None  08/05/2022 11:00 AM Raynelle Dick, NP [facility] None  01/22/2023  8:45 AM Glean Salvo, NP [facility] None  06/12/2023 10:00 AM Raynelle Dick, NP [facility] None    HPI  71 y.o. female  presents for a complete physical and follow up for has Diabetic polyneuropathy (HCC); Foot drop, bilateral; T2_NIDDM; Mixed hyperlipidemia; Vitamin D deficiency; Medication management; Fatty liver; Cholelithiases; Hyperlipidemia associated with type 2 diabetes mellitus (HCC); Diabetes mellitus type 2 with peripheral artery disease (HCC); Diabetic macular edema of right eye with proliferative retinopathy associated with type 2 diabetes mellitus (HCC); Proliferative diabetic retinopathy of left eye with macular edema associated with type 2 diabetes mellitus (HCC); Right epiretinal membrane;  Non-restorative sleep; Snoring; RLS (restless legs syndrome); OSA on CPAP; CKD (chronic kidney disease) stage 2, GFR 60-89 ml/min; Osteoporosis; Lattice corneal dystrophy of both eyes; Epiretinal membrane, left eye; Cellulitis; and Sepsis (HCC) on their problem list..  Her blood pressure has been  controlled at home, today their BP is BP: 138/64 BP Readings from Last 3 Encounters:  06/11/22 138/64  04/23/22 118/66  04/08/22 118/70   She does workout. She denies chest pain, shortness of breath, dizziness.   BMI is Body mass index is 26.98 kg/m., she has not been working on diet and exercise. Wt Readings from Last 3 Encounters:  06/11/22 157 lb 3.2 oz (71.3 kg)  04/23/22 155 lb (70.3 kg)  04/08/22 161 lb (73 kg)   She has been urinating more frequently and occasionally has burning on urination.  Denies fevers and hematuria.   She continues to have trouble with toenails peeling. She also has a callus on bottom of left foot. Has not been followed by podiatry, will place a referral  She is not on cholesterol medication and denies myalgias. Her cholesterol is at goal. She was prescribed Crestor in the past but never toook. Currently on Omega 3 fatty acids The cholesterol last visit was:   Lab Results  Component Value Date   CHOL 168 09/11/2021   HDL 55 09/11/2021   LDLCALC 84 09/11/2021   TRIG 191 (H) 09/11/2021   CHOLHDL 3.1 09/11/2021    She has been working on diet and exercise for diabetes, she is not on bASA, she is not on ACE/ARB and denies hyperglycemia, increased appetite, paresthesia of the feet, and polyuria.She is on Glimepiride 2 mg QD with biggest meal and Metformin 500 mg BID. Blood sugars have been running 120-140. Last A1C in the office was:  Lab Results  Component Value Date   HGBA1C 7.8 (H) 03/25/2022   She has not been drinking many fluids throughout the day.  Last GFR: Lab Results  Component Value Date   GFRNONAA >60 03/27/2022    Patient is on Vitamin D supplement.   Lab Results  Component Value Date   VD25OH 68 06/10/2021      She has trouble with constipation and is using align probiotic.  Is not using a stool softener or miralax.  She moves her bowels every 9-10 days   Current Medications:  Current Outpatient Medications on File Prior to  Visit  Medication Sig Dispense Refill   acetaminophen (TYLENOL) 500 MG tablet Take 1,500 mg by mouth every 8 (eight) hours as needed for moderate pain.     Cholecalciferol (DIALYVITE VITAMIN D 5000) 125 MCG (5000 UT) capsule Take 5,000 Units by mouth daily.     DULoxetine (CYMBALTA) 30 MG capsule Take 1 capsule (30 mg total) by mouth daily. 90 capsule 3   glimepiride (AMARYL) 2 MG tablet TAKE 1 TABLET 2 X /DAY WITH MEALS FOR DIABETES 180 tablet 3   Magnesium 250 MG TABS Take 500 mg by mouth 2 (two) times daily.     metFORMIN (GLUCOPHAGE-XR) 500 MG 24 hr tablet TAKE 2 TABLETS BY MOUTH TWICE DAILY FOR DIABETES (Patient taking differently: Take 1,000 mg by mouth QID.) 360 tablet 1   Misc Natural Products (YUMVS BEET ROOT-TART CHERRY PO) Take by mouth.     zinc gluconate 50 MG tablet Take 50 mg by mouth daily.     blood glucose meter kit and supplies KIT Dispense based on patient and insurance preference. Use 3 times daily as directed. 1  each 0   fluconazole (DIFLUCAN) 150 MG tablet Take 1 tablet (150 mg) at the sign of symptoms.  If not resolved after 72 hours may take additional 150 mg dosage. (Patient not taking: Reported on 04/23/2022) 3 tablet 0   Omega-3 350 MG CPDR Take 350 mg by mouth 2 (two) times daily. (Patient not taking: Reported on 06/11/2022)     No current facility-administered medications on file prior to visit.   Allergies:  Allergies  Allergen Reactions   Gabapentin Swelling    Swelling in legs and feet   Medical History:  She has Diabetic polyneuropathy (HCC); Foot drop, bilateral; T2_NIDDM; Mixed hyperlipidemia; Vitamin D deficiency; Medication management; Fatty liver; Cholelithiases; Hyperlipidemia associated with type 2 diabetes mellitus (HCC); Diabetes mellitus type 2 with peripheral artery disease (HCC); Diabetic macular edema of right eye with proliferative retinopathy associated with type 2 diabetes mellitus (HCC); Proliferative diabetic retinopathy of left eye with  macular edema associated with type 2 diabetes mellitus (HCC); Right epiretinal membrane; Non-restorative sleep; Snoring; RLS (restless legs syndrome); OSA on CPAP; CKD (chronic kidney disease) stage 2, GFR 60-89 ml/min; Osteoporosis; Lattice corneal dystrophy of both eyes; Epiretinal membrane, left eye; Cellulitis; and Sepsis (HCC) on their problem list. Health Maintenance:   Immunization History  Administered Date(s) Administered   Influenza, High Dose Seasonal PF 05/12/2016, 07/03/2017, 05/24/2018, 06/10/2019, 05/02/2021   Influenza-Unspecified 07/03/2017   PFIZER(Purple Top)SARS-COV-2 Vaccination 09/22/2019, 10/13/2019, 06/15/2020   Pneumococcal Conjugate-13 02/07/2016   Pneumococcal Polysaccharide-23 09/03/2017   Td 02/07/2016   Zoster Recombinat (Shingrix) 10/11/2021, 01/06/2022    Tetanus:2017 Pneumovax: 2019 Prevnar 13: 2017 Flu vaccine: today  Zostavax:declines LMP: n/a Pap: uncertain MGM: 11/01/21 DEXA: 07/2019 osteoporosis Colonoscopy: Cologuard 04/2018 negative , 06/14/21 negative EGD:  Last Dental Exam: Last Eye Exam:Dr. Rankin sees monthly for diabetic retinopathy appt 06/11/21 Patient Care Team: Lucky Cowboy, MD as PCP - General (Internal Medicine) Edmon Crape, MD as Consulting Physician (Ophthalmology) York Spaniel, MD (Inactive) as Consulting Physician (Neurology) Mateo Flow, MD as Consulting Physician (Ophthalmology) Henreitta Leber, PA-C as Physician Assistant (Obstetrics and Gynecology)  Surgical History:  She has a past surgical history that includes Tonsillectomy; Abdominal hysterectomy; Hip surgery; Ankle fracture surgery; Cataract extraction, bilateral; Cataract extraction w/PHACO (Right, 02/21/2014); Cataract extraction w/PHACO (Left, 04/10/2014); Direct laryngoscopy (Left, 08/02/2021); and Mass biopsy (Left, 09/06/2021). Family History:  Herfamily history includes Breast cancer in her maternal aunt; Diabetes in her brother and mother. Social  History:  She reports that she has never smoked. She has never used smokeless tobacco. She reports that she does not drink alcohol and does not use drugs.  Review of Systems: Review of Systems  Constitutional:  Negative for malaise/fatigue and weight loss.  HENT:  Negative for congestion, hearing loss, sore throat and tinnitus.   Eyes:  Positive for blurred vision (retinopathy bilaterally).  Respiratory:  Negative for cough, sputum production, shortness of breath and wheezing.   Cardiovascular:  Negative for chest pain, palpitations, claudication and leg swelling.  Gastrointestinal:  Positive for constipation and heartburn. Negative for abdominal pain, diarrhea, nausea and vomiting.  Genitourinary:  Positive for dysuria and frequency.  Musculoskeletal:  Positive for joint pain. Negative for myalgias.  Skin:  Negative for rash.       Callus on bottom of left foot and toenails are peeling  Neurological:  Positive for sensory change (feet and legs are altered sensation) and weakness. Negative for dizziness.  Endo/Heme/Allergies:  Does not bruise/bleed easily.  Psychiatric/Behavioral:  Negative for depression. The patient does  not have insomnia.     Physical Exam: Estimated body mass index is 26.98 kg/m as calculated from the following:   Height as of this encounter: 5\' 4"  (1.626 m).   Weight as of this encounter: 157 lb 3.2 oz (71.3 kg). BP 138/64   Pulse 73   Temp (!) 97.3 F (36.3 C)   Ht 5\' 4"  (1.626 m)   Wt 157 lb 3.2 oz (71.3 kg)   SpO2 98%   BMI 26.98 kg/m  General Appearance: Well nourished, in no apparent distress.  Eyes: PERRLA, EOMs, conjunctiva no swelling or erythema, normal fundi and vessels.  Sinuses: No Frontal/maxillary tenderness  ENT/Mouth: Ext aud canals clear, normal light reflex with TMs without erythema, bulging. Good dentition. No erythema, swelling, or exudate on post pharynx. Tonsils not swollen or erythematous. Hearing normal.  Neck: Supple, thyroid  normal. No bruits  Respiratory: Respiratory effort normal, BS equal bilaterally without rales, rhonchi, wheezing or stridor.  Cardio: RRR without murmurs, rubs or gallops. DP and PT slighlty diminished Chest: symmetric, with normal excursions and percussion.  Breasts: Defer to GYN Abdomen: Positive bowel sounds all 4 quadrants, Soft, nontender, no guarding, rebound, hernias, masses, or organomegaly.  Lymphatics: Non tender without lymphadenopathy.  Genitourinary:Defer Musculoskeletal: Full ROM all peripheral extremities,5/5 strength, and antalgic gait with walker.  Skin: Feet and purple discoloration and cool to touch, Toenails are peeling with great toes the worst and some discoloration of 2nd and third toes.  Neuro: Cranial nerves intact, reflexes equal bilaterally. Normal muscle tone, no cerebellar symptoms. Sensation diminished in outer toes bilaterally  Psych: Awake and oriented X 3, normal affect, Insight and Judgment appropriate.   EKG: WNL no ST changes. AORTA SCAN: < 3 cm  Grace [name]  10:10 AM [facility]

## 2022-06-11 ENCOUNTER — Encounter: Payer: Self-pay | Admitting: Nurse Practitioner

## 2022-06-11 ENCOUNTER — Ambulatory Visit (INDEPENDENT_AMBULATORY_CARE_PROVIDER_SITE_OTHER): Payer: Medicare Other | Admitting: Nurse Practitioner

## 2022-06-11 VITALS — BP 138/64 | HR 73 | Temp 97.3°F | Ht 64.0 in | Wt 157.2 lb

## 2022-06-11 DIAGNOSIS — K5900 Constipation, unspecified: Secondary | ICD-10-CM

## 2022-06-11 DIAGNOSIS — E1122 Type 2 diabetes mellitus with diabetic chronic kidney disease: Secondary | ICD-10-CM

## 2022-06-11 DIAGNOSIS — E1169 Type 2 diabetes mellitus with other specified complication: Secondary | ICD-10-CM | POA: Diagnosis not present

## 2022-06-11 DIAGNOSIS — Z79899 Other long term (current) drug therapy: Secondary | ICD-10-CM | POA: Diagnosis not present

## 2022-06-11 DIAGNOSIS — L608 Other nail disorders: Secondary | ICD-10-CM

## 2022-06-11 DIAGNOSIS — E08311 Diabetes mellitus due to underlying condition with unspecified diabetic retinopathy with macular edema: Secondary | ICD-10-CM | POA: Diagnosis not present

## 2022-06-11 DIAGNOSIS — F321 Major depressive disorder, single episode, moderate: Secondary | ICD-10-CM

## 2022-06-11 DIAGNOSIS — E0842 Diabetes mellitus due to underlying condition with diabetic polyneuropathy: Secondary | ICD-10-CM | POA: Diagnosis not present

## 2022-06-11 DIAGNOSIS — E11311 Type 2 diabetes mellitus with unspecified diabetic retinopathy with macular edema: Secondary | ICD-10-CM | POA: Diagnosis not present

## 2022-06-11 DIAGNOSIS — E559 Vitamin D deficiency, unspecified: Secondary | ICD-10-CM | POA: Diagnosis not present

## 2022-06-11 DIAGNOSIS — R300 Dysuria: Secondary | ICD-10-CM

## 2022-06-11 DIAGNOSIS — N182 Chronic kidney disease, stage 2 (mild): Secondary | ICD-10-CM | POA: Diagnosis not present

## 2022-06-11 DIAGNOSIS — Z136 Encounter for screening for cardiovascular disorders: Secondary | ICD-10-CM | POA: Diagnosis not present

## 2022-06-11 DIAGNOSIS — I700 Atherosclerosis of aorta: Secondary | ICD-10-CM | POA: Diagnosis not present

## 2022-06-11 DIAGNOSIS — Z23 Encounter for immunization: Secondary | ICD-10-CM

## 2022-06-11 DIAGNOSIS — I10 Essential (primary) hypertension: Secondary | ICD-10-CM | POA: Diagnosis not present

## 2022-06-11 DIAGNOSIS — E785 Hyperlipidemia, unspecified: Secondary | ICD-10-CM | POA: Diagnosis not present

## 2022-06-11 NOTE — Patient Instructions (Signed)
Use a stool softener daily(dulcolax or colace) and use Miralax every other day  Constipation, Adult Constipation is when a person has fewer than three bowel movements in a week, has difficulty having a bowel movement, or has stools (feces) that are dry, hard, or larger than normal. Constipation may be caused by an underlying condition. It may become worse with age if a person takes certain medicines and does not take in enough fluids. Follow these instructions at home: Eating and drinking  Eat foods that have a lot of fiber, such as beans, whole grains, and fresh fruits and vegetables. Limit foods that are low in fiber and high in fat and processed sugars, such as fried or sweet foods. These include french fries, hamburgers, cookies, candies, and soda. Drink enough fluid to keep your urine pale yellow. General instructions Exercise regularly or as told by your health care provider. Try to do 150 minutes of moderate exercise each week. Use the bathroom when you have the urge to go. Do not hold it in. Take over-the-counter and prescription medicines only as told by your health care provider. This includes any fiber supplements. During bowel movements: Practice deep breathing while relaxing the lower abdomen. Practice pelvic floor relaxation. Watch your condition for any changes. Let your health care provider know about them. Keep all follow-up visits as told by your health care provider. This is important. Contact a health care provider if: You have pain that gets worse. You have a fever. You do not have a bowel movement after 4 days. You vomit. You are not hungry or you lose weight. You are bleeding from the opening between the buttocks (anus). You have thin, pencil-like stools. Get help right away if: You have a fever and your symptoms suddenly get worse. You leak stool or have blood in your stool. Your abdomen is bloated. You have severe pain in your abdomen. You feel dizzy or you  faint. Summary Constipation is when a person has fewer than three bowel movements in a week, has difficulty having a bowel movement, or has stools (feces) that are dry, hard, or larger than normal. Eat foods that have a lot of fiber, such as beans, whole grains, and fresh fruits and vegetables. Drink enough fluid to keep your urine pale yellow. Take over-the-counter and prescription medicines only as told by your health care provider. This includes any fiber supplements. This information is not intended to replace advice given to you by your health care provider. Make sure you discuss any questions you have with your health care provider. Document Revised: 06/22/2019 Document Reviewed: 06/22/2019 Elsevier Patient Education  [place].

## 2022-06-12 LAB — CBC WITH DIFFERENTIAL/PLATELET
Absolute Monocytes: 569 cells/uL (ref 200–950)
Basophils Absolute: 51 cells/uL (ref 0–200)
Basophils Relative: 0.7 %
Eosinophils Absolute: 219 cells/uL (ref 15–500)
Eosinophils Relative: 3 %
HCT: 34.8 % — ABNORMAL LOW (ref 35.0–45.0)
Hemoglobin: 11.5 g/dL — ABNORMAL LOW (ref 11.7–15.5)
Lymphs Abs: 1964 cells/uL (ref 850–3900)
MCH: 30.4 pg (ref 27.0–33.0)
MCHC: 33 g/dL (ref 32.0–36.0)
MCV: 92.1 fL (ref 80.0–100.0)
MPV: 9.5 fL (ref 7.5–12.5)
Monocytes Relative: 7.8 %
Neutro Abs: 4497 cells/uL (ref 1500–7800)
Neutrophils Relative %: 61.6 %
Platelets: 412 10*3/uL — ABNORMAL HIGH (ref 140–400)
RBC: 3.78 10*6/uL — ABNORMAL LOW (ref 3.80–5.10)
RDW: 12.8 % (ref 11.0–15.0)
Total Lymphocyte: 26.9 %
WBC: 7.3 10*3/uL (ref 3.8–10.8)

## 2022-06-12 LAB — MICROSCOPIC MESSAGE

## 2022-06-12 LAB — HEMOGLOBIN A1C
Hgb A1c MFr Bld: 7.1 % of total Hgb — ABNORMAL HIGH (ref <5.7)
Mean Plasma Glucose: 157 mg/dL
eAG (mmol/L): 8.7 mmol/L

## 2022-06-12 LAB — URINALYSIS, ROUTINE W REFLEX MICROSCOPIC
Bilirubin Urine: NEGATIVE
Glucose, UA: NEGATIVE
Hgb urine dipstick: NEGATIVE
Hyaline Cast: NONE SEEN /LPF
Nitrite: NEGATIVE
Protein, ur: NEGATIVE
RBC / HPF: NONE SEEN /HPF (ref 0–2)
Specific Gravity, Urine: 1.024 (ref 1.001–1.035)
pH: 5.5 (ref 5.0–8.0)

## 2022-06-12 LAB — URINE CULTURE
MICRO NUMBER:: 14100124
SPECIMEN QUALITY:: ADEQUATE

## 2022-06-12 LAB — COMPLETE METABOLIC PANEL WITH GFR
AG Ratio: 1.8 (calc) (ref 1.0–2.5)
ALT: 9 U/L (ref 6–29)
AST: 11 U/L (ref 10–35)
Albumin: 4.1 g/dL (ref 3.6–5.1)
Alkaline phosphatase (APISO): 54 U/L (ref 37–153)
BUN: 22 mg/dL (ref 7–25)
CO2: 31 mmol/L (ref 20–32)
Calcium: 10.2 mg/dL (ref 8.6–10.4)
Chloride: 102 mmol/L (ref 98–110)
Creat: 0.98 mg/dL (ref 0.60–1.00)
Globulin: 2.3 g/dL (calc) (ref 1.9–3.7)
Glucose, Bld: 98 mg/dL (ref 65–99)
Potassium: 5.2 mmol/L (ref 3.5–5.3)
Sodium: 141 mmol/L (ref 135–146)
Total Bilirubin: 0.2 mg/dL (ref 0.2–1.2)
Total Protein: 6.4 g/dL (ref 6.1–8.1)
eGFR: 61 mL/min/{1.73_m2} (ref >=60)

## 2022-06-12 LAB — MICROALBUMIN / CREATININE URINE RATIO
Creatinine, Urine: 157 mg/dL (ref 20–275)
Microalb Creat Ratio: 3 mcg/mg creat (ref <30)
Microalb, Ur: 0.5 mg/dL

## 2022-06-12 LAB — VITAMIN D 25 HYDROXY (VIT D DEFICIENCY, FRACTURES): Vit D, 25-Hydroxy: 76 ng/mL (ref 30–100)

## 2022-06-12 LAB — MAGNESIUM: Magnesium: 1.6 mg/dL (ref 1.5–2.5)

## 2022-06-12 LAB — LIPID PANEL
Cholesterol: 192 mg/dL (ref <200)
HDL: 60 mg/dL (ref >=50)
LDL Cholesterol (Calc): 98 mg/dL (calc)
Non-HDL Cholesterol (Calc): 132 mg/dL (calc) — ABNORMAL HIGH (ref <130)
Total CHOL/HDL Ratio: 3.2 (calc) (ref <5.0)
Triglycerides: 215 mg/dL — ABNORMAL HIGH (ref <150)

## 2022-06-12 LAB — TSH: TSH: 2.07 mIU/L (ref 0.40–4.50)

## 2022-06-19 ENCOUNTER — Encounter (INDEPENDENT_AMBULATORY_CARE_PROVIDER_SITE_OTHER): Payer: Medicare Other | Admitting: Ophthalmology

## 2022-06-23 ENCOUNTER — Ambulatory Visit: Payer: Medicare Other | Admitting: Podiatry

## 2022-06-23 DIAGNOSIS — E113411 Type 2 diabetes mellitus with severe nonproliferative diabetic retinopathy with macular edema, right eye: Secondary | ICD-10-CM | POA: Diagnosis not present

## 2022-06-23 DIAGNOSIS — E113412 Type 2 diabetes mellitus with severe nonproliferative diabetic retinopathy with macular edema, left eye: Secondary | ICD-10-CM | POA: Diagnosis not present

## 2022-06-23 DIAGNOSIS — H35373 Puckering of macula, bilateral: Secondary | ICD-10-CM | POA: Diagnosis not present

## 2022-06-23 LAB — HM DIABETES EYE EXAM

## 2022-06-24 ENCOUNTER — Encounter (INDEPENDENT_AMBULATORY_CARE_PROVIDER_SITE_OTHER): Payer: Medicare Other | Admitting: Ophthalmology

## 2022-06-25 ENCOUNTER — Ambulatory Visit (INDEPENDENT_AMBULATORY_CARE_PROVIDER_SITE_OTHER): Payer: Medicare Other | Admitting: Internal Medicine

## 2022-06-25 ENCOUNTER — Encounter: Payer: Self-pay | Admitting: Internal Medicine

## 2022-06-25 VITALS — BP 130/80 | HR 76 | Temp 97.8°F | Resp 16 | Ht 64.0 in | Wt 152.8 lb

## 2022-06-25 DIAGNOSIS — R300 Dysuria: Secondary | ICD-10-CM

## 2022-06-25 DIAGNOSIS — I10 Essential (primary) hypertension: Secondary | ICD-10-CM

## 2022-06-25 DIAGNOSIS — Z79899 Other long term (current) drug therapy: Secondary | ICD-10-CM | POA: Diagnosis not present

## 2022-06-25 DIAGNOSIS — E113411 Type 2 diabetes mellitus with severe nonproliferative diabetic retinopathy with macular edema, right eye: Secondary | ICD-10-CM | POA: Diagnosis not present

## 2022-06-25 DIAGNOSIS — H35372 Puckering of macula, left eye: Secondary | ICD-10-CM | POA: Diagnosis not present

## 2022-06-25 DIAGNOSIS — E113412 Type 2 diabetes mellitus with severe nonproliferative diabetic retinopathy with macular edema, left eye: Secondary | ICD-10-CM | POA: Diagnosis not present

## 2022-06-25 DIAGNOSIS — G4733 Obstructive sleep apnea (adult) (pediatric): Secondary | ICD-10-CM | POA: Diagnosis not present

## 2022-06-25 NOTE — Patient Instructions (Signed)
Urinary Tract Infection A urinary tract infection (UTI) is an infection of any part of the urinary tract. The urinary tract includes the kidneys, ureters, bladder, and urethra. These organs make, store, and get rid of urine in the body. An upper UTI affects the ureters and kidneys. A lower UTI affects the bladder and urethra. What are the causes? Most urinary tract infections are caused by bacteria in your genital area around your urethra, where urine leaves your body. These bacteria grow and cause inflammation of your urinary tract. What increases the risk? You are more likely to develop this condition if: You have a urinary catheter that stays in place. You are not able to control when you urinate or have a bowel movement (incontinence). You are female and you: Use a spermicide or diaphragm for birth control. Have low estrogen levels. Are pregnant. You have certain genes that increase your risk. You are sexually active. You take antibiotic medicines. You have a condition that causes your flow of urine to slow down, such as: An enlarged prostate, if you are female. Blockage in your urethra. A kidney stone. A nerve condition that affects your bladder control (neurogenic bladder). Not getting enough to drink, or not urinating often. You have certain medical conditions, such as: Diabetes. A weak disease-fighting system (immunesystem). Sickle cell disease. Gout. Spinal cord injury. What are the signs or symptoms? Symptoms of this condition include: Needing to urinate right away (urgency). Frequent urination. This may include small amounts of urine each time you urinate. Pain or burning with urination. Blood in the urine. Urine that smells bad or unusual. Trouble urinating. Cloudy urine. Vaginal discharge, if you are female. Pain in the abdomen or the lower back. You may also have: Vomiting or a decreased appetite. Confusion. Irritability or tiredness. A fever or  chills. Diarrhea. The first symptom in older adults may be confusion. In some cases, they may not have any symptoms until the infection has worsened. How is this diagnosed? This condition is diagnosed based on your medical history and a physical exam. You may also have other tests, including: Urine tests. Blood tests. Tests for STIs (sexually transmitted infections). If you have had more than one UTI, a cystoscopy or imaging studies may be done to determine the cause of the infections. How is this treated? Treatment for this condition includes: Antibiotic medicine. Over-the-counter medicines to treat discomfort. Drinking enough water to stay hydrated. If you have frequent infections or have other conditions such as a kidney stone, you may need to see a health care provider who specializes in the urinary tract (urologist). In rare cases, urinary tract infections can cause sepsis. Sepsis is a life-threatening condition that occurs when the body responds to an infection. Sepsis is treated in the hospital with IV antibiotics, fluids, and other medicines. Follow these instructions at home:  Medicines Take over-the-counter and prescription medicines only as told by your health care provider. If you were prescribed an antibiotic medicine, take it as told by your health care provider. Do not stop using the antibiotic even if you start to feel better. General instructions Make sure you: Empty your bladder often and completely. Do not hold urine for long periods of time. Empty your bladder after sex. Wipe from front to back after urinating or having a bowel movement if you are female. Use each tissue only one time when you wipe. Drink enough fluid to keep your urine pale yellow. Keep all follow-up visits. This is important. Contact a health care provider  if: Your symptoms do not get better after 1-2 days. Your symptoms go away and then return. Get help right away if: You have severe pain in  your back or your lower abdomen. You have a fever or chills. You have nausea or vomiting. Summary A urinary tract infection (UTI) is an infection of any part of the urinary tract, which includes the kidneys, ureters, bladder, and urethra. Most urinary tract infections are caused by bacteria in your genital area. Treatment for this condition often includes antibiotic medicines. If you were prescribed an antibiotic medicine, take it as told by your health care provider. Do not stop using the antibiotic even if you start to feel better. Keep all follow-up visits. This is important.

## 2022-06-25 NOTE — Progress Notes (Signed)
Future Appointments  Date Time Provider Department  06/30/2022  8:30 AM Trula Slade, DPM [facility]  09/17/2022  9:30 AM Alycia Rossetti, NP [facility]  01/22/2023  8:45 AM Suzzanne Cloud, NP [facility]  06/12/2023 10:00 AM Alycia Rossetti, NP [facility]        This very nice 71 y.o. MWF with HTN, HLD, T2_DM,  CPAP /OSA  and Vitamin D Deficiency who presents with        Patient is followed for  hx/HTN  predating circa 1960's & BP has been controlled  tapered off meds.  Today's BP is at goal - 130/80. Patient has had no complaints of any cardiac type chest pain, palpitations, dyspnea / orthopnea / PND, dizziness, claudication, or dependent edema.       Hyperlipidemia is controlled with diet . Last Lipids were at goal except sl elevated Trig's :  Lab Results  Component Value Date   CHOL 168 09/11/2021   HDL 55 09/11/2021   LDLCALC 84 09/11/2021   TRIG 191 (H) 09/11/2021   CHOLHDL 3.1 09/11/2021     Also, the patient has history of Gestational DM (1980) and then in 2006 was started on Metformin fo T2_NIDDM w/CKD2 (GFR 65)  > Patient is followed closely by Dr [name] for Diabetic Retinopathy & Wet MD.  She also has painful Diabetic  Peripheral Sensory Neuropathy with bilateral foot drop.  She has had no symptoms of reactive hypoglycemia, diabetic polys.   Patient admits poor dietary compliance and last A1c was not at goal :  Lab Results  Component Value Date   HGBA1C 7.9 (H) 09/11/2021                                                           Further, the patient also has history of Vitamin D Deficiency ("26" /2016) and supplements vitamin D. Last vitamin D was at goal :  Lab Results  Component Value Date   VD25OH 68 06/10/2021     Current Outpatient Medications on File Prior to Visit  Medication Sig   acetaminophen 500 MG tablet Take 1,500 mg  every 8  hrs as needed for pain.   Calcium 200 MG TABS Take 400 mg  daily.   VITAMIN D 5000 u  Take daily.   DULoxetine 30  MG capsule Take 1 capsule daily.   glimepiride 2 MG tablet Take  daily.)   Magnesium 250 MG TABS Take 500 mg  daily.   metFORMIN-XR 500 MG TAKE 2 TABLETS TWICE DAILY    Omega-3 350 MG  Take  2  times daily.   zinc 50 MG tablet Take  daily.    1. Essential hypertension  - Continue medication, monitor blood pressure at home.  - Continue DASH diet.  Reminder to go to the ER if any CP,  SOB, nausea, dizziness, severe HA, changes vision/speech.    - CBC with Differential/Platelet - COMPLETE METABOLIC PANEL WITH GFR - Magnesium - TSH  2. Hyperlipidemia associated with type 2 diabetes mellitus ([place])  - Continue diet/meds, exercise,& lifestyle modifications.  - Continue monitor periodic cholesterol/liver & renal functions     - Lipid panel - TSH  3. Type 2 diabetes mellitus with stage 2 chronic kidney  disease, without long-term current use of insulin ([facility])  -  Continue diet, exercise  - Lifestyle modifications.  - Monitor appropriate labs.\    - Hemoglobin A1c - Insulin, random  4. Diabetic retinopathy of both eyes with macular edema    (H[facility])   - Hemoglobin A1c  5. Diabetic polyneuropathy associated with diabetes  mellitus due to underlying condition ([place])  - Hemoglobin A1c  6. Vitamin D deficiency  - Continue supplementation     - VITAMIN D 25 Hydroxy   7. OSA on CPAP   8. Medication management  - CBC with Differential/Platelet - COMPLETE METABOLIC PANEL WITH GFR - Magnesium - Lipid panel - TSH - Hemoglobin A1c - Insulin, random - VITAMIN D 25 Hydroxy

## 2022-06-26 ENCOUNTER — Other Ambulatory Visit: Payer: Self-pay | Admitting: Nurse Practitioner

## 2022-06-26 ENCOUNTER — Ambulatory Visit: Payer: Medicare Other | Admitting: Nurse Practitioner

## 2022-06-26 DIAGNOSIS — N182 Chronic kidney disease, stage 2 (mild): Secondary | ICD-10-CM

## 2022-06-28 ENCOUNTER — Other Ambulatory Visit: Payer: Self-pay | Admitting: Internal Medicine

## 2022-06-28 DIAGNOSIS — N3000 Acute cystitis without hematuria: Secondary | ICD-10-CM

## 2022-06-28 LAB — URINALYSIS, ROUTINE W REFLEX MICROSCOPIC
Bilirubin Urine: NEGATIVE
Glucose, UA: NEGATIVE
Hgb urine dipstick: NEGATIVE
Hyaline Cast: NONE SEEN /LPF
Nitrite: POSITIVE — AB
Specific Gravity, Urine: 1.02 (ref 1.001–1.035)
pH: 5.5 (ref 5.0–8.0)

## 2022-06-28 LAB — URINE CULTURE
MICRO NUMBER:: 14161592
SPECIMEN QUALITY:: ADEQUATE

## 2022-06-28 LAB — MICROSCOPIC MESSAGE

## 2022-06-28 MED ORDER — NITROFURANTOIN MONOHYD MACRO 100 MG PO CAPS: 100.0000 mg | ORAL_CAPSULE | Freq: Two times a day (BID) | ORAL | 0 refills | Status: DC

## 2022-06-28 NOTE — Progress Notes (Signed)
<><><><><><><><><><><><><><><><><><><><><><><><><><><><><><><><><> <><><><><><><><><><><><><><><><><><><><><><><><><><><><><><><><><> -   Test results slightly outside the reference range are not unusual. If there is anything important, I will review this with you,  otherwise it is considered normal test values.  If you have further questions,  please do not hesitate to contact me at the office or via My Chart.  <><><><><><><><><><><><><><><><><><><><><><><><><><><><><><><><><> <><><><><><><><><><><><><><><><><><><><><><><><><><><><><><><><><>  -  U/C grew out another UTI , So [facility] Macrobid  2 x /day for 1 week  - Please schedule a NV in 1 month to recheck urine   <><><><><><><><><><><><><><><><><><><><><><><><><><><><><><><><><> <><><><><><><><><><><><><><><><><><><><><><><><><><><><><><><><><>

## 2022-06-29 ENCOUNTER — Encounter: Payer: Self-pay | Admitting: Internal Medicine

## 2022-06-30 ENCOUNTER — Encounter: Payer: Self-pay | Admitting: Internal Medicine

## 2022-06-30 ENCOUNTER — Ambulatory Visit (INDEPENDENT_AMBULATORY_CARE_PROVIDER_SITE_OTHER): Payer: Medicare Other | Admitting: Podiatry

## 2022-06-30 DIAGNOSIS — M79675 Pain in left toe(s): Secondary | ICD-10-CM | POA: Diagnosis not present

## 2022-06-30 DIAGNOSIS — I739 Peripheral vascular disease, unspecified: Secondary | ICD-10-CM | POA: Diagnosis not present

## 2022-06-30 DIAGNOSIS — B351 Tinea unguium: Secondary | ICD-10-CM

## 2022-06-30 DIAGNOSIS — M79674 Pain in right toe(s): Secondary | ICD-10-CM

## 2022-06-30 MED ORDER — CICLOPIROX 8 % EX SOLN: Freq: Every day | CUTANEOUS | 0 refills | Status: DC

## 2022-06-30 NOTE — Patient Instructions (Signed)
I have ordered a circulation test for [facility] [address]. If you do not hear for them about scheduling within the next 1 week, or you have any questions please give [place] a call at [phone]  Ciclopirox Topical Solution What is this medication? CICLOPIROX (sye kloe PEER ox) treats fungal infections of the nails. It belongs to a group of medications called antifungals. It will not treat infections caused by bacteria or viruses. This medicine may be used for other purposes; ask your health care provider or pharmacist if you have questions. COMMON BRAND NAME(S): Ciclodan Nail Solution, CNL8, [place] What should I tell my care team before I take this medication? They need to know if you have any of these conditions: Diabetes (high blood sugar) Immune system problems Organ transplant Receiving steroid inhalers, cream, or lotion Seizures Tingling of the fingers or toes or other nerve disorder An unusual or allergic reaction to ciclopirox, other medications, foods, dyes, or preservatives Pregnant or trying to get pregnant Breast-feeding How should I use this medication? This medication is for external use only. Do not take by mouth. Wash your hands before and after use. If you are treating your hands, only wash your hands before use. Do not get it in your eyes. If you do, rinse your eyes with plenty of cool tap water. Use it as directed on the prescription label at the same time every day. Do not use it more often than directed. Use the medication for the full course as directed by your care team, even if you think you are better. Do not stop using it unless your care team tells you to stop it early. Apply a thin film of the medication to the affected area. Talk to your care team about the use of this medication in children. While it may be prescribed for children as young as 12 years for selected conditions, precautions do apply. Overdosage: If you think you have taken too much of this  medicine contact a poison control center or emergency room at once. NOTE: This medicine is only for you. Do not share this medicine with others. What if I miss a dose? If you miss a dose, use it as soon as you can. If it is almost time for your next dose, use only that dose. Do not use double or extra doses. What may interact with this medication? Interactions are not expected. Do not use any other skin products without telling your care team. This list may not describe all possible interactions. Give your health care provider a list of all the medicines, herbs, non-prescription drugs, or dietary supplements you use. Also tell them if you smoke, drink alcohol, or use illegal drugs. Some items may interact with your medicine. What should I watch for while using this medication? Visit your care team for regular checks on your progress. It may be some time before you see the benefit from this medication. Do not use nail polish or other nail cosmetic products on the treated nails. Removal of the unattached, infected nail by your care team is needed with use of this medication. If you have diabetes or numbness in your fingers or toes, talk to your care team about proper nail care. What side effects may I notice from receiving this medication? Side effects that you should report to your care team as soon as possible: Allergic reactions--skin rash, itching, hives, swelling of the face, lips, tongue, or throat Burning, itching, crusting, or peeling of treated skin Side  effects that usually do not require medical attention (report to your care team if they continue or are bothersome): Change in nail shape, thickness, or color Mild skin irritation, redness, or dryness This list may not describe all possible side effects. Call your doctor for medical advice about side effects. You may report side effects to FDA at 1-800-FDA-1088. Where should I keep my medication? Keep out of the reach of children and  pets. Store at room temperature between 20 and 25 degrees C (68 and 77 degrees F). This medication is flammable. Avoid exposure to heat, fire, flame, and smoking. Get rid of medications that are no longer needed or have expired: Take the medication to a medication take-back program. Check with your pharmacy or law enforcement to find a location. If you cannot return the medication, check the label or package insert to see if the medication should be thrown out in the garbage or flushed down the toilet. If you are not sure, ask your care team. If it is safe to put in the trash, take the medication out of the container. Mix the medication with cat litter, dirt, coffee grounds, or other unwanted substance. Seal the mixture in a bag or container. Put it in the trash. NOTE: This sheet is a summary. It may not cover all possible information. If you have questions about this medicine, talk to your doctor, pharmacist, or health care provider.  2023 Elsevier/Gold Standard (2021-07-16 00:00:00)

## 2022-06-30 NOTE — Progress Notes (Signed)
LMOM. Grace [name] [facility]

## 2022-06-30 NOTE — Progress Notes (Unsigned)
Subjective:   Patient ID: Grace [name], female   DOB: 71 y.o.   MRN: [id number]   HPI Chief Complaint  Patient presents with   Nail Problem    Toenail deformity started 5 + years ago, Neuropathy, Diabetic A1c-7.1 Bg- 111(yesterday), Numbness, Patient denies any pain s    She has no pain but she states the toes are mostly numb.   6 months ago she had a blister on the foot that was treated but no other open lesions.    ROS      Objective:  Physical Exam  General: AAO x3, NAD  Dermatological: Skin is warm, dry and supple bilateral. Nails x 10 are well manicured; remaining integument appears unremarkable at this time. There are no open sores, no preulcerative lesions, no rash or signs of infection present.  Vascular: Dorsalis Pedis artery and Posterior Tibial artery pedal pulses are 2/4 bilateral with immedate capillary fill time. Pedal hair growth present. No varicosities and no lower extremity edema present bilateral. There is no pain with calf compression, swelling, warmth, erythema.   Neruologic: Grossly intact via light touch bilateral. Vibratory intact via tuning fork bilateral. Protective threshold with Semmes Wienstein monofilament intact to all pedal sites bilateral. Patellar and Achilles deep tendon reflexes 2+ bilateral. No Babinski or clonus noted bilateral.   Musculoskeletal: No gross boney pedal deformities bilateral. No pain, crepitus, or limitation noted with foot and ankle range of motion bilateral. Muscular strength 5/5 in all groups tested bilateral.  Gait:: Wears AFO bilaterally.       Assessment:  ***     Plan:  ***

## 2022-07-01 ENCOUNTER — Other Ambulatory Visit: Payer: Self-pay

## 2022-07-01 ENCOUNTER — Emergency Department (HOSPITAL_COMMUNITY): Payer: Medicare Other

## 2022-07-01 ENCOUNTER — Inpatient Hospital Stay (HOSPITAL_COMMUNITY)
Admission: EM | Admit: 2022-07-01 | Discharge: 2022-07-04 | DRG: 689 | Disposition: A | Discharge status: Home / Self Care | Payer: Medicare Other | Attending: Internal Medicine | Admitting: Internal Medicine

## 2022-07-01 DIAGNOSIS — R0689 Other abnormalities of breathing: Secondary | ICD-10-CM | POA: Diagnosis not present

## 2022-07-01 DIAGNOSIS — B9620 Unspecified Escherichia coli [E. coli] as the cause of diseases classified elsewhere: Secondary | ICD-10-CM | POA: Diagnosis present

## 2022-07-01 DIAGNOSIS — N182 Chronic kidney disease, stage 2 (mild): Secondary | ICD-10-CM | POA: Diagnosis present

## 2022-07-01 DIAGNOSIS — E1122 Type 2 diabetes mellitus with diabetic chronic kidney disease: Secondary | ICD-10-CM | POA: Diagnosis present

## 2022-07-01 DIAGNOSIS — K760 Fatty (change of) liver, not elsewhere classified: Secondary | ICD-10-CM | POA: Diagnosis present

## 2022-07-01 DIAGNOSIS — A419 Sepsis, unspecified organism: Principal | ICD-10-CM

## 2022-07-01 DIAGNOSIS — E1151 Type 2 diabetes mellitus with diabetic peripheral angiopathy without gangrene: Secondary | ICD-10-CM | POA: Diagnosis present

## 2022-07-01 DIAGNOSIS — E1165 Type 2 diabetes mellitus with hyperglycemia: Secondary | ICD-10-CM | POA: Diagnosis present

## 2022-07-01 DIAGNOSIS — Z1152 Encounter for screening for COVID-19: Secondary | ICD-10-CM | POA: Diagnosis not present

## 2022-07-01 DIAGNOSIS — E785 Hyperlipidemia, unspecified: Secondary | ICD-10-CM | POA: Diagnosis present

## 2022-07-01 DIAGNOSIS — Z90710 Acquired absence of both cervix and uterus: Secondary | ICD-10-CM

## 2022-07-01 DIAGNOSIS — Z833 Family history of diabetes mellitus: Secondary | ICD-10-CM | POA: Diagnosis not present

## 2022-07-01 DIAGNOSIS — Z9841 Cataract extraction status, right eye: Secondary | ICD-10-CM

## 2022-07-01 DIAGNOSIS — R4182 Altered mental status, unspecified: Secondary | ICD-10-CM | POA: Diagnosis not present

## 2022-07-01 DIAGNOSIS — E11319 Type 2 diabetes mellitus with unspecified diabetic retinopathy without macular edema: Secondary | ICD-10-CM | POA: Diagnosis present

## 2022-07-01 DIAGNOSIS — R0902 Hypoxemia: Secondary | ICD-10-CM | POA: Diagnosis not present

## 2022-07-01 DIAGNOSIS — Z803 Family history of malignant neoplasm of breast: Secondary | ICD-10-CM | POA: Diagnosis not present

## 2022-07-01 DIAGNOSIS — G2581 Restless legs syndrome: Secondary | ICD-10-CM | POA: Diagnosis present

## 2022-07-01 DIAGNOSIS — E872 Acidosis, unspecified: Secondary | ICD-10-CM | POA: Diagnosis present

## 2022-07-01 DIAGNOSIS — E0842 Diabetes mellitus due to underlying condition with diabetic polyneuropathy: Secondary | ICD-10-CM

## 2022-07-01 DIAGNOSIS — E1142 Type 2 diabetes mellitus with diabetic polyneuropathy: Secondary | ICD-10-CM | POA: Diagnosis present

## 2022-07-01 DIAGNOSIS — G9341 Metabolic encephalopathy: Secondary | ICD-10-CM | POA: Diagnosis present

## 2022-07-01 DIAGNOSIS — G9340 Encephalopathy, unspecified: Secondary | ICD-10-CM | POA: Diagnosis present

## 2022-07-01 DIAGNOSIS — I700 Atherosclerosis of aorta: Secondary | ICD-10-CM | POA: Diagnosis not present

## 2022-07-01 DIAGNOSIS — N390 Urinary tract infection, site not specified: Principal | ICD-10-CM | POA: Diagnosis present

## 2022-07-01 DIAGNOSIS — E669 Obesity, unspecified: Secondary | ICD-10-CM | POA: Diagnosis present

## 2022-07-01 DIAGNOSIS — K219 Gastro-esophageal reflux disease without esophagitis: Secondary | ICD-10-CM | POA: Diagnosis present

## 2022-07-01 DIAGNOSIS — Z7984 Long term (current) use of oral hypoglycemic drugs: Secondary | ICD-10-CM

## 2022-07-01 DIAGNOSIS — Z961 Presence of intraocular lens: Secondary | ICD-10-CM | POA: Diagnosis present

## 2022-07-01 DIAGNOSIS — Z888 Allergy status to other drugs, medicaments and biological substances status: Secondary | ICD-10-CM

## 2022-07-01 DIAGNOSIS — R911 Solitary pulmonary nodule: Secondary | ICD-10-CM | POA: Diagnosis not present

## 2022-07-01 DIAGNOSIS — E1169 Type 2 diabetes mellitus with other specified complication: Secondary | ICD-10-CM | POA: Diagnosis present

## 2022-07-01 DIAGNOSIS — N3289 Other specified disorders of bladder: Secondary | ICD-10-CM | POA: Diagnosis not present

## 2022-07-01 DIAGNOSIS — R000 Tachycardia, unspecified: Secondary | ICD-10-CM | POA: Diagnosis not present

## 2022-07-01 DIAGNOSIS — Z79899 Other long term (current) drug therapy: Secondary | ICD-10-CM

## 2022-07-01 DIAGNOSIS — Z87440 Personal history of urinary (tract) infections: Secondary | ICD-10-CM | POA: Diagnosis not present

## 2022-07-01 DIAGNOSIS — G4733 Obstructive sleep apnea (adult) (pediatric): Secondary | ICD-10-CM | POA: Diagnosis present

## 2022-07-01 DIAGNOSIS — Z9842 Cataract extraction status, left eye: Secondary | ICD-10-CM | POA: Diagnosis not present

## 2022-07-01 DIAGNOSIS — E113511 Type 2 diabetes mellitus with proliferative diabetic retinopathy with macular edema, right eye: Secondary | ICD-10-CM

## 2022-07-01 DIAGNOSIS — R410 Disorientation, unspecified: Secondary | ICD-10-CM | POA: Diagnosis not present

## 2022-07-01 DIAGNOSIS — W19XXXA Unspecified fall, initial encounter: Secondary | ICD-10-CM | POA: Diagnosis present

## 2022-07-01 DIAGNOSIS — I959 Hypotension, unspecified: Secondary | ICD-10-CM | POA: Diagnosis not present

## 2022-07-01 DIAGNOSIS — Z6825 Body mass index (BMI) 25.0-25.9, adult: Secondary | ICD-10-CM

## 2022-07-01 DIAGNOSIS — R531 Weakness: Secondary | ICD-10-CM | POA: Diagnosis not present

## 2022-07-01 LAB — CBC WITH DIFFERENTIAL/PLATELET
Abs Immature Granulocytes: 0.02 10*3/uL (ref 0.00–0.07)
Basophils Absolute: 0 10*3/uL (ref 0.0–0.1)
Basophils Relative: 0 %
Eosinophils Absolute: 0.1 10*3/uL (ref 0.0–0.5)
Eosinophils Relative: 1 %
HCT: 37.6 % (ref 36.0–46.0)
Hemoglobin: 12.3 g/dL (ref 12.0–15.0)
Immature Granulocytes: 0 %
Lymphocytes Relative: 6 %
Lymphs Abs: 0.3 10*3/uL — ABNORMAL LOW (ref 0.7–4.0)
MCH: 30.1 pg (ref 26.0–34.0)
MCHC: 32.7 g/dL (ref 30.0–36.0)
MCV: 92.2 fL (ref 80.0–100.0)
Monocytes Absolute: 0.1 10*3/uL (ref 0.1–1.0)
Monocytes Relative: 2 %
Neutro Abs: 5.1 10*3/uL (ref 1.7–7.7)
Neutrophils Relative %: 91 %
Platelets: 357 10*3/uL (ref 150–400)
RBC: 4.08 MIL/uL (ref 3.87–5.11)
RDW: 13.1 % (ref 11.5–15.5)
WBC: 5.6 10*3/uL (ref 4.0–10.5)
nRBC: 0 % (ref 0.0–0.2)

## 2022-07-01 LAB — I-STAT VENOUS BLOOD GAS, ED
Acid-Base Excess: 2 mmol/L (ref 0.0–2.0)
Bicarbonate: 27.6 mmol/L (ref 20.0–28.0)
Calcium, Ion: 1.16 mmol/L (ref 1.15–1.40)
HCT: 34 % — ABNORMAL LOW (ref 36.0–46.0)
Hemoglobin: 11.6 g/dL — ABNORMAL LOW (ref 12.0–15.0)
O2 Saturation: 86 %
Potassium: 3.7 mmol/L (ref 3.5–5.1)
Sodium: 135 mmol/L (ref 135–145)
TCO2: 29 mmol/L (ref 22–32)
pCO2, Ven: 45.4 mmHg (ref 44–60)
pH, Ven: 7.392 (ref 7.25–7.43)
pO2, Ven: 52 mmHg — ABNORMAL HIGH (ref 32–45)

## 2022-07-01 LAB — APTT: aPTT: 25 seconds (ref 24–36)

## 2022-07-01 LAB — COMPREHENSIVE METABOLIC PANEL
ALT: 14 U/L (ref 0–44)
AST: 21 U/L (ref 15–41)
Albumin: 3.5 g/dL (ref 3.5–5.0)
Alkaline Phosphatase: 62 U/L (ref 38–126)
Anion gap: 13 (ref 5–15)
BUN: 15 mg/dL (ref 8–23)
CO2: 25 mmol/L (ref 22–32)
Calcium: 9.4 mg/dL (ref 8.9–10.3)
Chloride: 97 mmol/L — ABNORMAL LOW (ref 98–111)
Creatinine, Ser: 1.02 mg/dL — ABNORMAL HIGH (ref 0.44–1.00)
GFR, Estimated: 58 mL/min — ABNORMAL LOW (ref >60)
Glucose, Bld: 217 mg/dL — ABNORMAL HIGH (ref 70–99)
Potassium: 4.1 mmol/L (ref 3.5–5.1)
Sodium: 135 mmol/L (ref 135–145)
Total Bilirubin: 0.6 mg/dL (ref 0.3–1.2)
Total Protein: 6.4 g/dL — ABNORMAL LOW (ref 6.5–8.1)

## 2022-07-01 LAB — URINALYSIS, ROUTINE W REFLEX MICROSCOPIC
Bilirubin Urine: NEGATIVE
Glucose, UA: 50 mg/dL — AB
Hgb urine dipstick: NEGATIVE
Ketones, ur: 20 mg/dL — AB
Nitrite: NEGATIVE
Protein, ur: 30 mg/dL — AB
Specific Gravity, Urine: 1.012 (ref 1.005–1.030)
pH: 5 (ref 5.0–8.0)

## 2022-07-01 LAB — CBG MONITORING, ED: Glucose-Capillary: 230 mg/dL — ABNORMAL HIGH (ref 70–99)

## 2022-07-01 LAB — RESP PANEL BY RT-PCR (FLU A&B, COVID) ARPGX2
Influenza A by PCR: NEGATIVE
Influenza B by PCR: NEGATIVE
SARS Coronavirus 2 by RT PCR: NEGATIVE

## 2022-07-01 LAB — LACTIC ACID, PLASMA
Lactic Acid, Venous: 2.3 mmol/L (ref 0.5–1.9)
Lactic Acid, Venous: 2 mmol/L (ref 0.5–1.9)

## 2022-07-01 LAB — PROTIME-INR
INR: 1 (ref 0.8–1.2)
Prothrombin Time: 13.1 seconds (ref 11.4–15.2)

## 2022-07-01 MED ORDER — LACTATED RINGERS IV BOLUS (SEPSIS)
250.0000 mL | Freq: Once | INTRAVENOUS | Status: AC
  Administered 2022-07-01: 250 mL via INTRAVENOUS

## 2022-07-01 MED ORDER — LACTATED RINGERS IV BOLUS (SEPSIS)
1000.0000 mL | Freq: Once | INTRAVENOUS | Status: AC
  Administered 2022-07-01: 1000 mL via INTRAVENOUS

## 2022-07-01 MED ORDER — LACTATED RINGERS IV SOLN: INTRAVENOUS | Status: AC

## 2022-07-01 MED ORDER — ACETAMINOPHEN 650 MG RE SUPP
650.0000 mg | Freq: Once | RECTAL | Status: AC
  Administered 2022-07-01: 650 mg via RECTAL
  Filled 2022-07-01: qty 1

## 2022-07-01 MED ORDER — INSULIN ASPART 100 UNIT/ML IJ SOLN
0.0000 [IU] | Freq: Three times a day (TID) | INTRAMUSCULAR | Status: DC
  Administered 2022-07-02 (×2): 3 [IU] via SUBCUTANEOUS
  Administered 2022-07-02: 2 [IU] via SUBCUTANEOUS
  Administered 2022-07-03: 3 [IU] via SUBCUTANEOUS
  Administered 2022-07-03 (×2): 2 [IU] via SUBCUTANEOUS
  Administered 2022-07-04: 3 [IU] via SUBCUTANEOUS

## 2022-07-01 MED ORDER — POLYETHYLENE GLYCOL 3350 17 G PO PACK: 17.0000 g | PACK | Freq: Every day | ORAL | Status: DC | PRN

## 2022-07-01 MED ORDER — SODIUM CHLORIDE 0.9 % IV SOLN
2.0000 g | INTRAVENOUS | Status: DC
  Administered 2022-07-01 – 2022-07-03 (×3): 2 g via INTRAVENOUS
  Filled 2022-07-01 (×3): qty 20

## 2022-07-01 MED ORDER — DULOXETINE HCL 30 MG PO CPEP
30.0000 mg | ORAL_CAPSULE | Freq: Every day | ORAL | Status: DC
  Administered 2022-07-02 – 2022-07-04 (×3): 30 mg via ORAL
  Filled 2022-07-01 (×3): qty 1

## 2022-07-01 MED ORDER — ACETAMINOPHEN 650 MG RE SUPP: 650.0000 mg | Freq: Four times a day (QID) | RECTAL | Status: DC | PRN

## 2022-07-01 MED ORDER — ACETAMINOPHEN 325 MG PO TABS
650.0000 mg | ORAL_TABLET | Freq: Four times a day (QID) | ORAL | Status: DC | PRN
  Administered 2022-07-03: 650 mg via ORAL
  Filled 2022-07-01: qty 2

## 2022-07-01 MED ORDER — ENOXAPARIN SODIUM 40 MG/0.4ML IJ SOSY
40.0000 mg | PREFILLED_SYRINGE | INTRAMUSCULAR | Status: DC
  Administered 2022-07-01 – 2022-07-03 (×3): 40 mg via SUBCUTANEOUS
  Filled 2022-07-01 (×3): qty 0.4

## 2022-07-01 MED ORDER — SODIUM CHLORIDE 0.9% FLUSH
3.0000 mL | Freq: Two times a day (BID) | INTRAVENOUS | Status: DC
  Administered 2022-07-02 – 2022-07-03 (×3): 3 mL via INTRAVENOUS

## 2022-07-01 NOTE — H&P (Addendum)
History and Physical   Grace [name] ZOX:[id number] DOB: 1951-07-10 DOA: 07/01/2022  PCP: Unk Pinto, MD   Patient coming from: Home  Chief Complaint: Altered mental status  HPI: Grace [name] is a 71 y.o. female with medical history significant of diabetes, neuropathy, hyperlipidemia, OSA on CPAP, RLS, CKD 2, fatty liver presenting with altered mental status.  Patient last known normal yesterday.  She was seen on the eighth of this month outpatient and treated for recurrent UTI.  She did not start taking her antibiotic until last night.  Around 4 AM this morning she got up to go to the bathroom and then she had a "fall "where she slid herself to the ground but did not hit her head nor lose consciousness.  Has been unable to pick patient up without assistance and so EMS was called to help lift.  Together they are able to lift patient up and she was able to walk with walker to the living room where she is left until the afternoon.  However, when she awoke she was shaking and unable to answer her husband's questions.  EMS then called again to transfer patient to the ED for further evaluation.  Mentation has improved and patient denies chest pain, shortness of breath, abdominal pain, constipation, diarrhea, nausea, vomiting.  ED Course: Vital signs in ED significant for fever to 104.2, heart rate in the 90s to 100s, respiratory rate in the 20s, blood pressure 811B to 147W systolic.  Lab work-up included CMP with chloride 97, creatinine stable 122, glucose 217, protein 6.4.  CBC within normal limits.  PT, PTT, INR within normal limits.  Lactic acid pending.  Rester panel flu COVID-negative.  Urinalysis with ketones, protein, bacteria.  Urine culture and blood culture pending.  VBG with normal pH and PCO2.  Chest x-ray showed no acute abnormality.  CT head showed no acute abnormality.  CT of the chest abdomen pelvis showed no evidence of infection, 4 mm pulmonary nodule, initially read  as Foley catheter with bulb and urethra however patient did not have Foley catheter in place and EDP checked with radiology who states this likely represents a cystocele.  Review of Systems: As per HPI otherwise all other systems reviewed and are negative.  Past Medical History:  Diagnosis Date   Abnormality of gait 07/28/2013   Chronic kidney disease    Diabetes (HCC)    Diabetic peripheral neuropathy (HCC)    Diabetic retinopathy ([place])    Difficult intubation    Foot drop, bilateral 07/31/2014   GERD (gastroesophageal reflux disease)    Peripheral edema    Polyneuropathy in diabetes(357.2) 07/28/2013   Sleep apnea    uses cpap   Tongue mass     Past Surgical History:  Procedure Laterality Date   ABDOMINAL HYSTERECTOMY     ANKLE FRACTURE SURGERY     left   CATARACT EXTRACTION W/PHACO Right 02/21/2014   Dr. Herbert Deaner   CATARACT EXTRACTION W/PHACO Left 04/10/2014   Dr. Herbert Deaner   CATARACT EXTRACTION, BILATERAL     DIRECT LARYNGOSCOPY Left 08/02/2021   Procedure: DIRECT LARYNGOSCOPY WITH TONGUE BIOPSY;  Surgeon: Leta Baptist, MD;  Location: [facility];  Service: ENT;  Laterality: Left;   HIP SURGERY     right   MASS BIOPSY Left 09/06/2021   Procedure: NECK MASS BIOPSY;  Surgeon: Leta Baptist, MD;  Location: [facility];  Service: ENT;  Laterality: Left;   TONSILLECTOMY      Social History  reports that she has never smoked. She has never used smokeless tobacco. She reports that she does not drink alcohol and does not use drugs.  Allergies  Allergen Reactions   Gabapentin Swelling    Swelling in legs and feet    Family History  Problem Relation Age of Onset   Diabetes Mother    Breast cancer Maternal Aunt    Diabetes Brother    Neuropathy Neg Hx   Reviewed on admission  Prior to Admission medications   Medication Sig Start Date End Date Taking? Authorizing Provider  ACCU-CHEK GUIDE test strip TEST UP TO 4 TIMES DAILY AS DIRECTED 06/26/22    Alycia Rossetti, NP  acetaminophen (TYLENOL) 500 MG tablet Take 1,500 mg by mouth every 8 (eight) hours as needed for moderate pain.    [provider]  blood glucose meter kit and supplies KIT Dispense based on patient and insurance preference. Use 3 times daily as directed. 03/31/22   Alycia Rossetti, NP  Cholecalciferol (DIALYVITE VITAMIN D 5000) 125 MCG (5000 UT) capsule Take 5,000 Units by mouth daily.    [provider]  ciclopirox (PENLAC) 8 % solution Apply topically at bedtime. Apply over nail and surrounding skin. Apply daily over previous coat. After seven (7) days, may remove with alcohol and continue cycle. 06/30/22   Trula Slade, DPM  DULoxetine (CYMBALTA) 30 MG capsule Take 1 capsule (30 mg total) by mouth daily. 01/22/22   Suzzanne Cloud, NP  glimepiride (AMARYL) 2 MG tablet TAKE 1 TABLET 2 X /DAY WITH MEALS FOR DIABETES 04/03/22   Darrol Jump, NP  Magnesium 250 MG TABS Take 500 mg by mouth 2 (two) times daily.    [provider]  metFORMIN (GLUCOPHAGE-XR) 500 MG 24 hr tablet TAKE 2 TABLETS BY MOUTH TWICE DAILY FOR DIABETES Patient taking differently: Take 1,000 mg by mouth QID. 03/21/22   Alycia Rossetti, NP  Misc Natural Products (YUMVS BEET ROOT-TART CHERRY PO) Take by mouth.    [provider]  zinc gluconate 50 MG tablet Take 50 mg by mouth daily.    [provider]    Physical Exam: Vitals:   07/01/22 1535 07/01/22 1545 07/01/22 1645 07/01/22 1652  BP:  (!) 187/84 (!) 155/62   Pulse:  (!) 106 98   Resp:  (!) 26 (!) 24   Temp: (S) (!) 103.8 F (39.9 C)   100.2 F (37.9 C)  TempSrc: Rectal   Oral  SpO2:  95% 93%   Weight:        Physical Exam Constitutional:      General: She is not in acute distress.    Appearance: Normal appearance. She is obese.  HENT:     Head: Normocephalic and atraumatic.     Mouth/Throat:     Mouth: Mucous membranes are moist.     Pharynx: Oropharynx is clear.  Eyes:      Extraocular Movements: Extraocular movements intact.     Pupils: Pupils are equal, round, and reactive to light.  Cardiovascular:     Rate and Rhythm: Normal rate and regular rhythm.     Pulses: Normal pulses.     Heart sounds: Normal heart sounds.  Pulmonary:     Effort: Pulmonary effort is normal. No respiratory distress.     Breath sounds: Normal breath sounds.  Abdominal:     General: Bowel sounds are normal. There is no distension.     Palpations: Abdomen is soft.  Tenderness: There is no abdominal tenderness.  Musculoskeletal:        General: No swelling or deformity.  Skin:    General: Skin is warm and dry.  Neurological:     General: No focal deficit present.     Mental Status: Mental status is at baseline.    Labs on Admission: I have personally reviewed following labs and imaging studies  CBC: Recent Labs  Lab 07/01/22 1433 07/01/22 1634  WBC 5.6  --   NEUTROABS 5.1  --   HGB 12.3 11.6*  HCT 37.6 34.0*  MCV 92.2  --   PLT 357  --     Basic Metabolic Panel: Recent Labs  Lab 07/01/22 1433 07/01/22 1634  NA 135 135  K 4.1 3.7  CL 97*  --   CO2 25  --   GLUCOSE 217*  --   BUN 15  --   CREATININE 1.02*  --   CALCIUM 9.4  --     GFR: Estimated Creatinine Clearance: 46.9 mL/min (A) (by C-G formula based on SCr of 1.02 mg/dL (H)).  Liver Function Tests: Recent Labs  Lab 07/01/22 1433  AST 21  ALT 14  ALKPHOS 62  BILITOT 0.6  PROT 6.4*  ALBUMIN 3.5    Urine analysis:    Component Value Date/Time   COLORURINE YELLOW 07/01/2022 1504   APPEARANCEUR CLEAR 07/01/2022 1504   LABSPEC 1.012 07/01/2022 1504   PHURINE 5.0 07/01/2022 1504   GLUCOSEU 50 (A) 07/01/2022 1504   HGBUR NEGATIVE 07/01/2022 1504   BILIRUBINUR NEGATIVE 07/01/2022 1504   KETONESUR 20 (A) 07/01/2022 1504   PROTEINUR 30 (A) 07/01/2022 1504   NITRITE NEGATIVE 07/01/2022 1504   LEUKOCYTESUR TRACE (A) 07/01/2022 1504    Radiological Exams on Admission: CT CHEST ABDOMEN  PELVIS WO CONTRAST  Result Date: 07/01/2022 CLINICAL DATA:  Sepsis, mental status change. Unknown source of infection. EXAM: CT CHEST, ABDOMEN AND PELVIS WITHOUT CONTRAST TECHNIQUE: Multidetector CT imaging of the chest, abdomen and pelvis was performed following the standard protocol without IV contrast. RADIATION DOSE REDUCTION: This exam was performed according to the departmental dose-optimization program which includes automated exposure control, adjustment of the mA and/or kV according to patient size and/or use of iterative reconstruction technique. COMPARISON:  None Available. FINDINGS: CT CHEST FINDINGS Cardiovascular: No significant vascular findings. Normal heart size. No pericardial effusion. Mediastinum/Nodes: No axillary or supraclavicular adenopathy. No mediastinal or hilar adenopathy. No pericardial fluid. Esophagus normal. Lungs/Pleura: Single small 4 mm RIGHT upper lobe pulmonary nodule (image 38/4) no airspace disease. No pleural fluid. Airways are normal Musculoskeletal: No aggressive osseous lesion. CT ABDOMEN AND PELVIS FINDINGS Hepatobiliary: No focal hepatic lesion. No biliary ductal dilatation. Gallbladder is normal. Common bile duct is normal. Pancreas: Pancreas is normal. No ductal dilatation. No pancreatic inflammation. Spleen: Normal spleen Adrenals/urinary tract: Adrenal glands and kidneys are normal. The ureters and bladder normal. The Foley catheter bulb is expanded in proximal urethra just below the base the bladder. (Axial image 116/3 in sagittal image 52/6.) The bladder is mildly distended. Stomach/Bowel: Stomach, small bowel, appendix, and cecum are normal. The colon and rectosigmoid colon are normal. Vascular/Lymphatic: Abdominal aorta is normal caliber with atherosclerotic calcification. There is no retroperitoneal or periportal lymphadenopathy. No pelvic lymphadenopathy. Reproductive: Post hysterectomy.  Adnexa unremarkable Other: No free fluid or free air. No abscess or  infection identified. Musculoskeletal: RIGHT hip prosthetic.  No aggressive osseous lesion IMPRESSION: 1. Foley catheter with retention bulb expanded in the proximal urethra. Recommend repositioning  to within the bladder lumen. Bladder mildly distended. 2. There is no source of infection on noncontrast chest abdomen pelvis CT. 3. 4 mm right solid pulmonary nodule within the upper lobe. Per Fleischner Society Guidelines, if patient is low risk for malignancy, no routine follow-up imaging is recommended. If patient is high risk for malignancy, a non-contrast Chest CT at 12 months is optional. If performed and the nodule is stable at 12 months, no further follow-up is recommended. These guidelines do not apply to immunocompromised patients and patients with cancer. Follow up in patients with significant comorbidities as clinically warranted. For lung cancer screening, adhere to Lung-RADS guidelines. Reference: Radiology. 2017; 284(1):228-43. Electronically Signed   By: Suzy Bouchard M.D.   On: 07/01/2022 16:35   CT Head Wo Contrast  Result Date: 07/01/2022 CLINICAL DATA:  Mental status change sepsis EXAM: CT HEAD WITHOUT CONTRAST TECHNIQUE: Contiguous axial images were obtained from the base of the skull through the vertex without intravenous contrast. RADIATION DOSE REDUCTION: This exam was performed according to the departmental dose-optimization program which includes automated exposure control, adjustment of the mA and/or kV according to patient size and/or use of iterative reconstruction technique. COMPARISON:  None Available. FINDINGS: Brain: Mild atrophy without hydrocephalus. Moderate white matter hypodensity bilaterally is symmetric and likely chronic. Negative for acute infarct, hemorrhage, mass Vascular: Negative for hyperdense vessel Skull: Negative Sinuses/Orbits: Mild mucosal edema paranasal sinuses. Bilateral cataract extraction Other: None IMPRESSION: No acute abnormality. Atrophy and moderate  chronic microvascular ischemic change in the white matter. Electronically Signed   By: Franchot Gallo M.D.   On: 07/01/2022 16:26   DG Chest Port 1 View  Result Date: 07/01/2022 CLINICAL DATA:  Possible sepsis weakness EXAM: PORTABLE CHEST 1 VIEW COMPARISON:  03/24/2022 FINDINGS: The heart size and mediastinal contours are within normal limits. Aortic atherosclerosis. Both lungs are clear. The visualized skeletal structures are unremarkable. Probable skin fold artifact over left lateral chest. IMPRESSION: No acute airspace disease. Skin fold artifact favored over pneumothorax at the left lateral chest, attention on follow-up chest x-ray. Electronically Signed   By: Donavan Foil M.D.   On: 07/01/2022 15:30    EKG: Independently reviewed.  Sinus rhythm at 93 bpm.  Some baseline artifact.  Assessment/Plan Active Problems:   Diabetic polyneuropathy (HCC)   Hyperlipidemia associated with type 2 diabetes mellitus (HCC)   Diabetes mellitus type 2 with peripheral artery disease (HCC)   RLS (restless legs syndrome)   OSA on CPAP   CKD (chronic kidney disease) stage 2, GFR 60-89 ml/min   Acute encephalopathy   Acute encephalopathy UTI > Patient presenting with episode of acute encephalopathy at home as described in HPI and when she woke up shaking and unable to answer questions. > In the ED found to have fever to 104.2, intermittent tachycardia, intermittent tachypnea. > No evidence of infection on CT chest abdomen pelvis.  Urinalysis did not show significant leukocytes or bacteria however she had a urinalysis and urine culture done outpatient on 11/8 which did show E. coli UTI. > She is only taken 2 doses of her outpatient antibiotics starting last night.  Episode of altered mentation started today. > Some improvement in mentation in the ED. > Given falls, will have PT eval as well. - Monitor on telemetry - Continue with ceftriaxone - Trend fever curve and WBC - Follow-up urine cultures and  blood cultures - Supportive care  CKD 2 > Creatinine stable 1.02 in the ED. - Trend renal function and electrolytes  Diabetes - SSI  Neuropathy - Continue home duloxetine  OSA - No longer on CPAP  DVT prophylaxis: Lovenox Code Status:   Full Family Communication:  Updated at bedside  Disposition Plan:   Patient is from:  Home  Anticipated DC to:  Home  Anticipated DC date:  1 to 2 days  Anticipated DC barriers: None  Consults called:  None Admission status:  Observation, telemetry  Severity of Illness: The appropriate patient status for this patient is OBSERVATION. Observation status is judged to be reasonable and necessary in order to provide the required intensity of service to ensure the patient's safety. The patient's presenting symptoms, physical exam findings, and initial radiographic and laboratory data in the context of their medical condition is felt to place them at decreased risk for further clinical deterioration. Furthermore, it is anticipated that the patient will be medically stable for discharge from the hospital within 2 midnights of admission.    Marcelyn Bruins MD [facility]  How to contact the [facility] Attending or Consulting provider [place] or covering provider during after hours [place], for this patient?   Check the care team in [facility] and look for a) attending/consulting [facility] provider listed and b) the [facility] team listed Log into [email or web address] and use [place]'s universal password to access. If you do not have the password, please contact the hospital operator. Locate the [facility] provider you are looking for under [facility] and page to a number that you can be directly reached. If you still have difficulty reaching the provider, please page the [facility] (Director on Call) for the Hospitalists listed on amion for assistance.  07/01/2022, 5:44 PM

## 2022-07-01 NOTE — ED Notes (Signed)
Pt O2 sats noted to be 88-89% while asleep, placed on 2L O2 via [place].

## 2022-07-01 NOTE — ED Notes (Signed)
In and out cath completed with assistance of [place], [place].

## 2022-07-01 NOTE — ED Provider Notes (Signed)
[facility] EMERGENCY DEPARTMENT Provider Note   CSN: [id number] Arrival date & time: 07/01/22  1433     History  Chief Complaint  Patient presents with   Altered Mental Status   Weakness    Grace [name] is a 71 y.o. female.  With history of diabetes, peripheral neuropathy, GERD, sleep apnea, CKD who presents to the ED via EMS for evaluation of altered mental status.  Per report, patient was seen over the weekend and treated for UTI.  Patient did have a fall earlier today while at home.  Fire was called for lift assist.  Patient's husband reported to EMS that she had acute change in her mental status and became mostly unresponsive.  This prompted husband to call EMS.  Husband at bedside after initial evaluation reports last known normal was yesterday before bed. Woke up today at 4 am and tried going to the bathroom. Slid herself down to the floor in the bathroom. Did not hit her head or lose consciousness. Fire was called for lift assist. Patient was ambulatory with her walker after this. Walked to the living room where she slept until this afternoon. Woke up shaking and unable to answer questions as normal.  This was what prompted EMS to be called.  Patient was prescribed Macrobid for E. coli UTI on 06/25/2022.  Took her first dose last night and second dose this morning.   Altered Mental Status Associated symptoms: weakness   Weakness      Home Medications Prior to Admission medications   Medication Sig Start Date End Date Taking? Authorizing Provider  ACCU-CHEK GUIDE test strip TEST UP TO 4 TIMES DAILY AS DIRECTED 06/26/22   Alycia Rossetti, NP  acetaminophen (TYLENOL) 500 MG tablet Take 1,500 mg by mouth every 8 (eight) hours as needed for moderate pain.    [provider]  blood glucose meter kit and supplies KIT Dispense based on patient and insurance preference. Use 3 times daily as directed. 03/31/22   Alycia Rossetti, NP  Cholecalciferol  (DIALYVITE VITAMIN D 5000) 125 MCG (5000 UT) capsule Take 5,000 Units by mouth daily.    [provider]  ciclopirox (PENLAC) 8 % solution Apply topically at bedtime. Apply over nail and surrounding skin. Apply daily over previous coat. After seven (7) days, may remove with alcohol and continue cycle. 06/30/22   Trula Slade, DPM  DULoxetine (CYMBALTA) 30 MG capsule Take 1 capsule (30 mg total) by mouth daily. 01/22/22   Suzzanne Cloud, NP  glimepiride (AMARYL) 2 MG tablet TAKE 1 TABLET 2 X /DAY WITH MEALS FOR DIABETES 04/03/22   Darrol Jump, NP  Magnesium 250 MG TABS Take 500 mg by mouth 2 (two) times daily.    [provider]  metFORMIN (GLUCOPHAGE-XR) 500 MG 24 hr tablet TAKE 2 TABLETS BY MOUTH TWICE DAILY FOR DIABETES Patient taking differently: Take 1,000 mg by mouth QID. 03/21/22   Alycia Rossetti, NP  Misc Natural Products (YUMVS BEET ROOT-TART CHERRY PO) Take by mouth.    [provider]  nitrofurantoin, macrocrystal-monohydrate, (MACROBID) 100 MG capsule Take 1 capsule (100 mg total) by mouth 2 (two) times daily for 7 days. Patient not taking: Reported on 06/30/2022 06/28/22 07/05/22  Unk Pinto, MD  zinc gluconate 50 MG tablet Take 50 mg by mouth daily.    [provider]      Allergies    Gabapentin    Review of Systems   Review of Systems  Unable to perform ROS: Mental status change  Neurological:  Positive for weakness.    Physical Exam Updated Vital Signs BP (!) 169/65   Pulse 91   Temp (!) 104.2 F (40.1 C)   Resp (!) 26   Wt 67 kg   SpO2 92%   BMI 25.35 kg/m  Physical Exam Vitals and nursing note reviewed.  Constitutional:      General: She is not in acute distress.    Appearance: She is well-developed. She is toxic-appearing. She is not ill-appearing or diaphoretic.  HENT:     Head: Normocephalic and atraumatic.  Eyes:     Conjunctiva/sclera: Conjunctivae normal.  Cardiovascular:     Rate and Rhythm: Normal  rate and regular rhythm.     Pulses: Normal pulses.     Heart sounds: No murmur heard. Pulmonary:     Effort: Pulmonary effort is normal. No respiratory distress.     Breath sounds: Normal breath sounds.  Abdominal:     Palpations: Abdomen is soft.     Tenderness: There is no abdominal tenderness.  Musculoskeletal:        General: No swelling.     Cervical back: Neck supple. No rigidity.  Skin:    General: Skin is warm and dry.     Capillary Refill: Capillary refill takes less than 2 seconds.  Neurological:     Mental Status: She is alert. She is disoriented.     Comments: Alert and follows commands, not oriented, will not answer questions  Psychiatric:        Mood and Affect: Mood normal.     ED Results / Procedures / Treatments   Labs (all labs ordered are listed, but only abnormal results are displayed) Labs Reviewed  RESP PANEL BY RT-PCR (FLU A&B, COVID) ARPGX2  CULTURE, BLOOD (ROUTINE X 2)  CULTURE, BLOOD (ROUTINE X 2)  URINE CULTURE  LACTIC ACID, PLASMA  LACTIC ACID, PLASMA  COMPREHENSIVE METABOLIC PANEL  CBC WITH DIFFERENTIAL/PLATELET  PROTIME-INR  APTT  URINALYSIS, ROUTINE W REFLEX MICROSCOPIC  I-STAT VENOUS BLOOD GAS, ED    EKG None  Radiology No results found.  Procedures Procedures    Medications Ordered in ED Medications  lactated ringers infusion (has no administration in time range)  lactated ringers bolus 1,000 mL (has no administration in time range)    And  lactated ringers bolus 1,000 mL (has no administration in time range)    And  lactated ringers bolus 250 mL (has no administration in time range)  cefTRIAXone (ROCEPHIN) 2 g in sodium chloride 0.9 % 100 mL IVPB (has no administration in time range)  acetaminophen (TYLENOL) suppository 650 mg (has no administration in time range)    ED Course/ Medical Decision Making/ A&P Clinical Course as of 07/01/22 1753  Tue Jul 01, 2022  1658 CT CHEST ABDOMEN PELVIS WO CONTRAST CT chest  abdomen pelvis shows possible Foley catheter bulb placement in the proximal urethra.  Patient does not have a Foley catheter in place. [AS]  1701 Reevaluation patient shows she is mentating closer to baseline.  Able answer questions.  Reports she has had dysuria, frequency and urgency for quite some time.  Unable to tell exactly how long.  Denies pain. [AS]  0722 Spoke with hospitalist Dr. Trilby Drummer. Will admit for urosepsis. [AS]    Clinical Course User Index [AS] Roylene Reason, PA-C  Medical Decision Making Amount and/or Complexity of Data Reviewed Labs: ordered. Radiology: ordered. Decision-making details documented in ED Course. ECG/medicine tests: ordered.  Risk OTC drugs. Prescription drug management.  This patient presents to the ED for concern of altered mental status, fever, this involves an extensive number of treatment options, and is a complaint that carries with it a high risk of complications and morbidity.  The differential diagnosis for AMS is extensive and includes, but is not limited to: drug overdose - opioids, alcohol, sedatives, antipsychotics, drug withdrawal, others; Metabolic: hypoxia, hypoglycemia, hyperglycemia, hypercalcemia, hypernatremia, hyponatremia, uremia, hepatic encephalopathy, hypothyroidism, hyperthyroidism, vitamin B12 or thiamine deficiency, carbon monoxide poisoning, Wilson's disease, Lactic acidosis, DKA/HHOS; Infectious: meningitis, encephalitis, bacteremia/sepsis, urinary tract infection, pneumonia, neurosyphilis; Structural: Space-occupying lesion, (brain tumor, subdural hematoma, hydrocephalus,); Vascular: stroke, subarachnoid hemorrhage, coronary ischemia, hypertensive encephalopathy, CNS vasculitis, thrombotic thrombocytopenic purpura, disseminated intravascular coagulation, hyperviscosity; Psychiatric: Schizophrenia, depression; Other: Seizure, hypothermia, heat stroke, ICU psychosis, dementia -"sundowning."    Co  morbidities that complicate the patient evaluation   diabetes, peripheral neuropathy sleep apnea, GERD peripheral edema, diabetic retinopathy, chronic kidney disease  My initial workup includes Code sepsis including fluid resuscitation and Rocephin.  Rectal Tylenol for fever  Additional history obtained from: Nursing notes from this visit. Previous records within EMR system office visit on 06/25/2022 which showed urine culture was positive, Macrobid BID x 7 days was initiated on the day Admission on 03/25/2022 for urosepsis Family husband provides a portion of the history EMS provides personal history  I ordered, reviewed and interpreted labs which include: CMP, CBC, INR, APTT, lactate, VBG, urinalysis, respiratory panel.  Initial lactate elevated at 2.3.  CMP significant for hyperglycemia 217, slightly elevated creatinine 1.02.  Urinalysis shows trace leukocytes, rare bacteria   I ordered imaging studies including chest x-ray, CT head, CT chest abdomen pelvis I independently visualized and interpreted imaging which showed chest x-ray suspicious for pneumothorax.  CT head normal.  CT chest abdomen pelvis initially intubated as Foley catheter bulb placement in the proximal urethra, however re-read shows this is likely cystocele.  Patient does not have Foley catheter in place at the time of CT I agree with the radiologist interpretation  Cardiac Monitoring:  The patient was maintained on a cardiac monitor.  I personally viewed and interpreted the cardiac monitored which showed an underlying rhythm of: NSR/sinus tach  Consultations Obtained:  I requested consultation with the hospitalist Dr. Trilby Drummer,  and discussed lab and imaging findings as well as pertinent plan - they recommend: admission for continued monitoring  Initially febrile at one 4.2.  Temperatures were down to 100.2 after rectal Tylenol.  Slightly tachypneic with a respiratory rate of 24 and slightly hypertensive at 155/62 at the  time of admission.  All improvements over initial values.  71 year old female presenting to the ED for evaluation of altered mental status.  Sepsis work-up initiated on arrival.  Patient does have a history of urosepsis with admission on 03/25/2022 for same.  Has had urinary symptoms persistently since discharge.  Was seen in office on 06/25/2022 and was started on Macrobid.  Urine culture positive for E. coli at that time.  Patient did not take her Macrobid until yesterday.  Took a dose yesterday and this morning.  Currently still complaining of dysuria, frequency and urgency.  No other complaints.  Physical exam remarkable for skin that is warm to the touch, facial flushing, and initial encephalopathy.  Encephalopathy did resolve with fluid resuscitation and IV antibiotics and patient was mentating at baseline  at the time of admission.  CT abdomen pelvis initially did show Foley catheter bulb placement in the proximal urethra, however I did call CT about this and reviewed shows that it is most likely a cystocele.  Patient did not have a Foley catheter in place at the time of CT.  CT head and chest abdomen pelvis otherwise unremarkable.  Lab work-up significant for initial lactate of 2.3, hyperglycemia 217, slight elevation in creatinine of 1.02 and urinalysis with trace leukocytes and rare bacteria. patient will be admitted to hospitalist team for continued management of urosepsis.   Patient's case discussed with Dr. Doren Custard who agrees with plan to admit for urosepsis.  Note: Portions of this report may have been transcribed using voice recognition software. Every effort was made to ensure accuracy; however, inadvertent computerized transcription errors may still be present.          Final Clinical Impression(s) / ED Diagnoses Final diagnoses:  None    Rx / DC Orders ED Discharge Orders     None         Roylene Reason, Hershal Coria 07/01/22 1759    Godfrey Pick, MD 07/03/22 [phone]

## 2022-07-01 NOTE — ED Notes (Signed)
Patient transported to CT 

## 2022-07-01 NOTE — Sepsis Progress Note (Signed)
Sepsis protocol monitored by eLink 

## 2022-07-01 NOTE — ED Triage Notes (Signed)
Pt BIB by EMS for complaints of sudden onset profound weakness and acute mental status change 1 hr ago. Pt did have a fall this morning, fire department came for a lift assist this morning. Unsure if hit her head. Not on blood thinners.  Diagnosed with UTI over the weekend, currently on antibiotics.  Vomiting in EMS. 18G LAC, '4mg'$  zofran given by EMS.

## 2022-07-02 ENCOUNTER — Encounter (HOSPITAL_COMMUNITY): Payer: Self-pay | Admitting: Internal Medicine

## 2022-07-02 DIAGNOSIS — E1151 Type 2 diabetes mellitus with diabetic peripheral angiopathy without gangrene: Secondary | ICD-10-CM | POA: Diagnosis present

## 2022-07-02 DIAGNOSIS — E872 Acidosis, unspecified: Secondary | ICD-10-CM | POA: Diagnosis present

## 2022-07-02 DIAGNOSIS — E1169 Type 2 diabetes mellitus with other specified complication: Secondary | ICD-10-CM | POA: Diagnosis present

## 2022-07-02 DIAGNOSIS — W19XXXA Unspecified fall, initial encounter: Secondary | ICD-10-CM | POA: Diagnosis present

## 2022-07-02 DIAGNOSIS — G9341 Metabolic encephalopathy: Secondary | ICD-10-CM | POA: Diagnosis present

## 2022-07-02 DIAGNOSIS — Z90710 Acquired absence of both cervix and uterus: Secondary | ICD-10-CM | POA: Diagnosis not present

## 2022-07-02 DIAGNOSIS — E1122 Type 2 diabetes mellitus with diabetic chronic kidney disease: Secondary | ICD-10-CM | POA: Diagnosis present

## 2022-07-02 DIAGNOSIS — B9620 Unspecified Escherichia coli [E. coli] as the cause of diseases classified elsewhere: Secondary | ICD-10-CM | POA: Diagnosis present

## 2022-07-02 DIAGNOSIS — Z9842 Cataract extraction status, left eye: Secondary | ICD-10-CM | POA: Diagnosis not present

## 2022-07-02 DIAGNOSIS — E785 Hyperlipidemia, unspecified: Secondary | ICD-10-CM | POA: Diagnosis present

## 2022-07-02 DIAGNOSIS — N390 Urinary tract infection, site not specified: Secondary | ICD-10-CM | POA: Diagnosis present

## 2022-07-02 DIAGNOSIS — N182 Chronic kidney disease, stage 2 (mild): Secondary | ICD-10-CM | POA: Diagnosis present

## 2022-07-02 DIAGNOSIS — K219 Gastro-esophageal reflux disease without esophagitis: Secondary | ICD-10-CM | POA: Diagnosis present

## 2022-07-02 DIAGNOSIS — Z803 Family history of malignant neoplasm of breast: Secondary | ICD-10-CM | POA: Diagnosis not present

## 2022-07-02 DIAGNOSIS — G9340 Encephalopathy, unspecified: Secondary | ICD-10-CM | POA: Diagnosis not present

## 2022-07-02 DIAGNOSIS — E1165 Type 2 diabetes mellitus with hyperglycemia: Secondary | ICD-10-CM | POA: Diagnosis present

## 2022-07-02 DIAGNOSIS — E669 Obesity, unspecified: Secondary | ICD-10-CM | POA: Diagnosis present

## 2022-07-02 DIAGNOSIS — E1142 Type 2 diabetes mellitus with diabetic polyneuropathy: Secondary | ICD-10-CM | POA: Diagnosis present

## 2022-07-02 DIAGNOSIS — G4733 Obstructive sleep apnea (adult) (pediatric): Secondary | ICD-10-CM | POA: Diagnosis present

## 2022-07-02 DIAGNOSIS — Z1152 Encounter for screening for COVID-19: Secondary | ICD-10-CM | POA: Diagnosis not present

## 2022-07-02 DIAGNOSIS — K760 Fatty (change of) liver, not elsewhere classified: Secondary | ICD-10-CM | POA: Diagnosis present

## 2022-07-02 DIAGNOSIS — G2581 Restless legs syndrome: Secondary | ICD-10-CM | POA: Diagnosis present

## 2022-07-02 DIAGNOSIS — Z87440 Personal history of urinary (tract) infections: Secondary | ICD-10-CM | POA: Diagnosis not present

## 2022-07-02 DIAGNOSIS — Z833 Family history of diabetes mellitus: Secondary | ICD-10-CM | POA: Diagnosis not present

## 2022-07-02 DIAGNOSIS — E11319 Type 2 diabetes mellitus with unspecified diabetic retinopathy without macular edema: Secondary | ICD-10-CM | POA: Diagnosis present

## 2022-07-02 LAB — CBC
HCT: 31.8 % — ABNORMAL LOW (ref 36.0–46.0)
Hemoglobin: 10.5 g/dL — ABNORMAL LOW (ref 12.0–15.0)
MCH: 30.3 pg (ref 26.0–34.0)
MCHC: 33 g/dL (ref 30.0–36.0)
MCV: 91.9 fL (ref 80.0–100.0)
Platelets: 347 10*3/uL (ref 150–400)
RBC: 3.46 MIL/uL — ABNORMAL LOW (ref 3.87–5.11)
RDW: 13.2 % (ref 11.5–15.5)
WBC: 11.6 10*3/uL — ABNORMAL HIGH (ref 4.0–10.5)
nRBC: 0 % (ref 0.0–0.2)

## 2022-07-02 LAB — CBG MONITORING, ED
Glucose-Capillary: 196 mg/dL — ABNORMAL HIGH (ref 70–99)
Glucose-Capillary: 176 mg/dL — ABNORMAL HIGH (ref 70–99)

## 2022-07-02 LAB — COMPREHENSIVE METABOLIC PANEL
ALT: 23 U/L (ref 0–44)
AST: 33 U/L (ref 15–41)
Albumin: 2.6 g/dL — ABNORMAL LOW (ref 3.5–5.0)
Alkaline Phosphatase: 49 U/L (ref 38–126)
Anion gap: 9 (ref 5–15)
BUN: 15 mg/dL (ref 8–23)
CO2: 27 mmol/L (ref 22–32)
Calcium: 8.6 mg/dL — ABNORMAL LOW (ref 8.9–10.3)
Chloride: 99 mmol/L (ref 98–111)
Creatinine, Ser: 1.03 mg/dL — ABNORMAL HIGH (ref 0.44–1.00)
GFR, Estimated: 58 mL/min — ABNORMAL LOW (ref >60)
Glucose, Bld: 245 mg/dL — ABNORMAL HIGH (ref 70–99)
Potassium: 4.2 mmol/L (ref 3.5–5.1)
Sodium: 135 mmol/L (ref 135–145)
Total Bilirubin: 0.4 mg/dL (ref 0.3–1.2)
Total Protein: 5.2 g/dL — ABNORMAL LOW (ref 6.5–8.1)

## 2022-07-02 LAB — GLUCOSE, CAPILLARY
Glucose-Capillary: 125 mg/dL — ABNORMAL HIGH (ref 70–99)
Glucose-Capillary: 114 mg/dL — ABNORMAL HIGH (ref 70–99)

## 2022-07-02 LAB — GROUP A STREP BY PCR: Group A Strep by PCR: NOT DETECTED

## 2022-07-02 MED ORDER — LACTATED RINGERS IV SOLN: INTRAVENOUS | Status: DC

## 2022-07-02 NOTE — Progress Notes (Signed)
PROGRESS NOTE    Grace [name]  JJK:[id number] DOB: 1951-03-02 DOA: 07/01/2022 PCP: Unk Pinto, MD   Brief Narrative:  71 y.o. female with medical history significant of diabetes, neuropathy, hyperlipidemia, OSA on CPAP, RLS, CKD 2, fatty liver, recurrent UTI and was supposed to be on antibiotic as an outpatient currently presented with altered mental status.  On presentation, she was febrile to 104.2.  COVID-19 and influenza testing negative.  UA suggestive of UTI.  Chest x-ray showed no acute abnormality.  CT head showed no acute abnormality.  CT of the chest, abdomen and pelvis showed no evidence of infection, 4 mm pulmonary nodule.  She was started on IV fluids and antibiotics.  Assessment & Plan:   Acute metabolic encephalopathy UTI: Present on admission Lactic acidosis -Patient has history of recurrent UTI and was supposed to be on antibiotics currently as an outpatient.  Outpatient urine culture on 06/25/2022 had shown E. coli  -Presented with altered mental status and was found to be febrile, intermittently tachycardic, intermittently tachypneic with UA suggestive of UTI.  CT head along with CT chest, abdomen and pelvis were unremarkable -Mental status much improved this morning.  Monitor.  Continue Rocephin.  Follow cultures. -Consider outpatient urology evaluation and follow-up -PT eval.  Leukocytosis -Mild.  Monitor  CKD stage II -creatinine stable.  Monitor  Diabetes mellitus type 2 with hyperglycemia Diabetic neuropathy -Continue CBGs with SSI.  Continue on duloxetine  OSA -Currently no longer on CPAP   DVT prophylaxis: Lovenox Code Status: Full Family Communication: None at bedside Disposition Plan: Status is: Observation The patient will require care spanning > 2 midnights and should be moved to inpatient because: Of need for IV antibiotics and fluids   Consultants: None  Procedures: None  Antimicrobials: Rocephin from 07/01/2022  onwards   Subjective: Patient seen and examined at bedside.  Slow to respond but answers some questions appropriately.  Feels slightly better but feels weak. Objective: Vitals:   07/02/22 0730 07/02/22 0800 07/02/22 0830 07/02/22 0850  BP: (!) 143/67 (!) 147/77 (!) 158/68   Pulse: 86 86 85   Resp: (!) 23 20 (!) 23   Temp:    98.9 F (37.2 C)  TempSrc:    Oral  SpO2: 94% 96% 100%   Weight:    68 kg  Height:    '5\' 4"'$  (1.626 m)   No intake or output data in the 24 hours ending 07/02/22 0933 Filed Weights   07/01/22 1508 07/02/22 0850  Weight: 67 kg 68 kg    Examination:  General exam: Appears calm and comfortable.  Currently on 2 L oxygen via nasal cannula.  No distress. Respiratory system: Bilateral decreased breath sounds at bases with some scattered crackles and intermittent tachypnea Cardiovascular system: S1 & S2 heard, Rate controlled Gastrointestinal system: Abdomen is nondistended, soft and nontender. Normal bowel sounds heard. Extremities: No cyanosis, clubbing; trace lower extremity edema present Central nervous system: Alert and oriented.  Slow to respond but answers questions appropriately.  No focal neurological deficits. Moving extremities Skin: No rashes, lesions or ulcers Psychiatry: Affect is mostly flat.  No signs of agitation.    Data Reviewed: I have personally reviewed following labs and imaging studies  CBC: Recent Labs  Lab 07/01/22 1433 07/01/22 1634 07/02/22 0023  WBC 5.6  --  11.6*  NEUTROABS 5.1  --   --   HGB 12.3 11.6* 10.5*  HCT 37.6 34.0* 31.8*  MCV 92.2  --  91.9  PLT 357  --  923   Basic Metabolic Panel: Recent Labs  Lab 07/01/22 1433 07/01/22 1634 07/02/22 0023  NA 135 135 135  K 4.1 3.7 4.2  CL 97*  --  99  CO2 25  --  27  GLUCOSE 217*  --  245*  BUN 15  --  15  CREATININE 1.02*  --  1.03*  CALCIUM 9.4  --  8.6*   GFR: Estimated Creatinine Clearance: 46.8 mL/min (A) (by C-G formula based on SCr of 1.03 mg/dL  (H)). Liver Function Tests: Recent Labs  Lab 07/01/22 1433 07/02/22 0023  AST 21 33  ALT 14 23  ALKPHOS 62 49  BILITOT 0.6 0.4  PROT 6.4* 5.2*  ALBUMIN 3.5 2.6*   No results for input(s): "LIPASE", "AMYLASE" in the last 168 hours. No results for input(s): "AMMONIA" in the last 168 hours. Coagulation Profile: Recent Labs  Lab 07/01/22 1433  INR 1.0   Cardiac Enzymes: No results for input(s): "CKTOTAL", "CKMB", "CKMBINDEX", "TROPONINI" in the last 168 hours. BNP (last 3 results) No results for input(s): "PROBNP" in the last 8760 hours. HbA1C: No results for input(s): "HGBA1C" in the last 72 hours. CBG: Recent Labs  Lab 07/01/22 2320 07/02/22 0847  GLUCAP 230* 196*   Lipid Profile: No results for input(s): "CHOL", "HDL", "LDLCALC", "TRIG", "CHOLHDL", "LDLDIRECT" in the last 72 hours. Thyroid Function Tests: No results for input(s): "TSH", "T4TOTAL", "FREET4", "T3FREE", "THYROIDAB" in the last 72 hours. Anemia Panel: No results for input(s): "VITAMINB12", "FOLATE", "FERRITIN", "TIBC", "IRON", "RETICCTPCT" in the last 72 hours. Sepsis Labs: Recent Labs  Lab 07/01/22 1433 07/01/22 1704  LATICACIDVEN 2.3* 2.0*    Recent Results (from the past 240 hour(s))  Urine Culture     Status: Abnormal   Collection Time: 06/25/22 10:50 AM   Specimen: Urine  Result Value Ref Range Status   MICRO NUMBER: [id number]  Final   SPECIMEN QUALITY: Adequate  Final   Sample Source URINE  Final   STATUS: FINAL  Final   ISOLATE 1: Escherichia coli (A)  Final    Comment: Greater than 100,000 CFU/mL of Escherichia coli      Susceptibility   Escherichia coli - URINE CULTURE, REFLEX    AMOX/CLAVULANIC >=32 Resistant     AMPICILLIN >=32 Resistant     AMPICILLIN/SULBACTAM >=32 Resistant     CEFAZOLIN* 8 Resistant      * For uncomplicated UTI caused by E. coli, K. pneumoniae or P. mirabilis: Cefazolin is susceptible if MIC <32 mcg/mL and predicts susceptible to the oral agents cefaclor,  cefdinir, cefpodoxime, cefprozil, cefuroxime, cephalexin and loracarbef.     CEFTAZIDIME <=1 Sensitive     CEFEPIME <=1 Sensitive     CEFTRIAXONE <=1 Sensitive     CIPROFLOXACIN <=0.25 Sensitive     LEVOFLOXACIN <=0.12 Sensitive     GENTAMICIN <=1 Sensitive     IMIPENEM <=0.25 Sensitive     NITROFURANTOIN <=16 Sensitive     PIP/TAZO <=4 Sensitive     TOBRAMYCIN <=1 Sensitive     TRIMETH/SULFA* <=20 Sensitive      * For uncomplicated UTI caused by E. coli, K. pneumoniae or P. mirabilis: Cefazolin is susceptible if MIC <32 mcg/mL and predicts susceptible to the oral agents cefaclor, cefdinir, cefpodoxime, cefprozil, cefuroxime, cephalexin and loracarbef. Legend: S = Susceptible  I = Intermediate R = Resistant  NS = Not susceptible * = Not tested  NR = Not reported **NN = See antimicrobic comments   MICROSCOPIC MESSAGE     Status:  None   Collection Time: 06/25/22 10:50 AM  Result Value Ref Range Status   Note   Final    Comment: This urine was analyzed for the presence of WBC,  RBC, bacteria, casts, and other formed elements.  Only those elements seen were reported. . .   Resp Panel by RT-PCR (Flu A&B, Covid) Anterior Nasal Swab     Status: None   Collection Time: 07/01/22  3:04 PM   Specimen: Anterior Nasal Swab  Result Value Ref Range Status   SARS Coronavirus 2 by RT PCR NEGATIVE NEGATIVE Final    Comment: (NOTE) SARS-CoV-2 target nucleic acids are NOT DETECTED.  The SARS-CoV-2 RNA is generally detectable in upper respiratory specimens during the acute phase of infection. The lowest concentration of SARS-CoV-2 viral copies this assay can detect is 138 copies/mL. A negative result does not preclude SARS-Cov-2 infection and should not be used as the sole basis for treatment or other patient management decisions. A negative result may occur with  improper specimen collection/handling, submission of specimen other than nasopharyngeal swab, presence of viral mutation(s)  within the areas targeted by this assay, and inadequate number of viral copies(<138 copies/mL). A negative result must be combined with clinical observations, patient history, and epidemiological information. The expected result is Negative.  Fact Sheet for Patients:  EntrepreneurPulse.com.au  Fact Sheet for Healthcare Providers:  IncredibleEmployment.be  This test is no t yet approved or cleared by the [place] FDA and  has been authorized for detection and/or diagnosis of SARS-CoV-2 by FDA under an Emergency Use Authorization (EUA). This EUA will remain  in effect (meaning this test can be used) for the duration of the COVID-19 declaration under Section 564(b)(1) of the Act, 21 U.S.C.section 360bbb-3(b)(1), unless the authorization is terminated  or revoked sooner.       Influenza A by PCR NEGATIVE NEGATIVE Final   Influenza B by PCR NEGATIVE NEGATIVE Final    Comment: (NOTE) The Xpert Xpress SARS-CoV-2/FLU/RSV plus assay is intended as an aid in the diagnosis of influenza from Nasopharyngeal swab specimens and should not be used as a sole basis for treatment. Nasal washings and aspirates are unacceptable for Xpert Xpress SARS-CoV-2/FLU/RSV testing.  Fact Sheet for Patients: EntrepreneurPulse.com.au  Fact Sheet for Healthcare Providers: IncredibleEmployment.be  This test is not yet approved or cleared by the [place] FDA and has been authorized for detection and/or diagnosis of SARS-CoV-2 by FDA under an Emergency Use Authorization (EUA). This EUA will remain in effect (meaning this test can be used) for the duration of the COVID-19 declaration under Section 564(b)(1) of the Act, 21 U.S.C. section 360bbb-3(b)(1), unless the authorization is terminated or revoked.  Performed at [facility], [place] [address], [place],  [address]          Radiology Studies: CT CHEST  ABDOMEN PELVIS WO CONTRAST  Addendum Date: 07/02/2022   ADDENDUM REPORT: 07/02/2022 08:40 ADDENDUM: The enlargement of the proximal urethra secondary to a bladder cystocele rather than the Foley catheter as Foley catheter is not present in conversation with ER physician. Electronically Signed   By: Suzy Bouchard M.D.   On: 07/02/2022 08:40   Result Date: 07/02/2022 CLINICAL DATA:  Sepsis, mental status change. Unknown source of infection. EXAM: CT CHEST, ABDOMEN AND PELVIS WITHOUT CONTRAST TECHNIQUE: Multidetector CT imaging of the chest, abdomen and pelvis was performed following the standard protocol without IV contrast. RADIATION DOSE REDUCTION: This exam was performed according to the departmental dose-optimization program which includes  automated exposure control, adjustment of the mA and/or kV according to patient size and/or use of iterative reconstruction technique. COMPARISON:  None Available. FINDINGS: CT CHEST FINDINGS Cardiovascular: No significant vascular findings. Normal heart size. No pericardial effusion. Mediastinum/Nodes: No axillary or supraclavicular adenopathy. No mediastinal or hilar adenopathy. No pericardial fluid. Esophagus normal. Lungs/Pleura: Single small 4 mm RIGHT upper lobe pulmonary nodule (image 38/4) no airspace disease. No pleural fluid. Airways are normal Musculoskeletal: No aggressive osseous lesion. CT ABDOMEN AND PELVIS FINDINGS Hepatobiliary: No focal hepatic lesion. No biliary ductal dilatation. Gallbladder is normal. Common bile duct is normal. Pancreas: Pancreas is normal. No ductal dilatation. No pancreatic inflammation. Spleen: Normal spleen Adrenals/urinary tract: Adrenal glands and kidneys are normal. The ureters and bladder normal. The Foley catheter bulb is expanded in proximal urethra just below the base the bladder. (Axial image 116/3 in sagittal image 52/6.) The bladder is mildly distended. Stomach/Bowel: Stomach, small bowel, appendix, and cecum are  normal. The colon and rectosigmoid colon are normal. Vascular/Lymphatic: Abdominal aorta is normal caliber with atherosclerotic calcification. There is no retroperitoneal or periportal lymphadenopathy. No pelvic lymphadenopathy. Reproductive: Post hysterectomy.  Adnexa unremarkable Other: No free fluid or free air. No abscess or infection identified. Musculoskeletal: RIGHT hip prosthetic.  No aggressive osseous lesion IMPRESSION: 1. Foley catheter with retention bulb expanded in the proximal urethra. Recommend repositioning to within the bladder lumen. Bladder mildly distended. 2. There is no source of infection on noncontrast chest abdomen pelvis CT. 3. 4 mm right solid pulmonary nodule within the upper lobe. Per Fleischner Society Guidelines, if patient is low risk for malignancy, no routine follow-up imaging is recommended. If patient is high risk for malignancy, a non-contrast Chest CT at 12 months is optional. If performed and the nodule is stable at 12 months, no further follow-up is recommended. These guidelines do not apply to immunocompromised patients and patients with cancer. Follow up in patients with significant comorbidities as clinically warranted. For lung cancer screening, adhere to Lung-RADS guidelines. Reference: Radiology. 2017; 284(1):228-43. Electronically Signed: By: Suzy Bouchard M.D. On: 07/01/2022 16:35   CT Head Wo Contrast  Result Date: 07/01/2022 CLINICAL DATA:  Mental status change sepsis EXAM: CT HEAD WITHOUT CONTRAST TECHNIQUE: Contiguous axial images were obtained from the base of the skull through the vertex without intravenous contrast. RADIATION DOSE REDUCTION: This exam was performed according to the departmental dose-optimization program which includes automated exposure control, adjustment of the mA and/or kV according to patient size and/or use of iterative reconstruction technique. COMPARISON:  None Available. FINDINGS: Brain: Mild atrophy without hydrocephalus.  Moderate white matter hypodensity bilaterally is symmetric and likely chronic. Negative for acute infarct, hemorrhage, mass Vascular: Negative for hyperdense vessel Skull: Negative Sinuses/Orbits: Mild mucosal edema paranasal sinuses. Bilateral cataract extraction Other: None IMPRESSION: No acute abnormality. Atrophy and moderate chronic microvascular ischemic change in the white matter. Electronically Signed   By: Franchot Gallo M.D.   On: 07/01/2022 16:26   DG Chest Port 1 View  Result Date: 07/01/2022 CLINICAL DATA:  Possible sepsis weakness EXAM: PORTABLE CHEST 1 VIEW COMPARISON:  03/24/2022 FINDINGS: The heart size and mediastinal contours are within normal limits. Aortic atherosclerosis. Both lungs are clear. The visualized skeletal structures are unremarkable. Probable skin fold artifact over left lateral chest. IMPRESSION: No acute airspace disease. Skin fold artifact favored over pneumothorax at the left lateral chest, attention on follow-up chest x-ray. Electronically Signed   By: Donavan Foil M.D.   On: 07/01/2022 15:30  Scheduled Meds:  DULoxetine  30 mg Oral Daily   enoxaparin (LOVENOX) injection  40 mg Subcutaneous Q24H   insulin aspart  0-15 Units Subcutaneous TID WC   sodium chloride flush  3 mL Intravenous Q12H   Continuous Infusions:  cefTRIAXone (ROCEPHIN)  IV Stopped (07/01/22 1601)   lactated ringers 150 mL/hr at 07/02/22 0442          Aline August, MD [facility] 07/02/2022, 9:33 AM

## 2022-07-02 NOTE — ED Notes (Signed)
Pt sleeping at this time VSS

## 2022-07-02 NOTE — Evaluation (Signed)
Physical Therapy Evaluation Patient Details Name: Grace [name] MRN: [id number] DOB: [birth year] Today's Date: 07/02/2022  History of Present Illness  Patient is a 71 yo female presenting to the ED on 03/24/22 due to weakness. Patient diagnosed with E coli UTI 1 week prior. Admitted 03/25/22 with cellulitis of LLE and L face and sepsis. PMH includes: DM2, sleep apnea, HTN and bilateral foot drop secondary to neuropathy  Clinical Impression  Pt admitted with above diagnosis. Pt was able to stand with bil HHA and mod assist physically but could not take steps to [facility] due to weakness. Husband states pt has difficulty getting OOB and out of chairs.  Recommended to pt and husband that pt have a short SNF stay but they both refuse this today.  Did let husband know pt may benefit from hospital bed or bed rail on her current bed and from a lift chair to ease burdent of care for him.  Pt currently with functional limitations due to the deficits listed below (see PT Problem List). Pt will benefit from skilled PT to increase their independence and safety with mobility to allow discharge to the venue listed below.          Recommendations for follow up therapy are one component of a multi-disciplinary discharge planning process, led by the attending physician.  Recommendations may be updated based on patient status, additional functional criteria and insurance authorization.  Follow Up Recommendations Skilled nursing-short term rehab (<3 hours/day) (Pt refusing SNF but would benefit) Can patient physically be transported by private vehicle: Yes    Assistance Recommended at Discharge Frequent or constant Supervision/Assistance  Patient can return home with the following  A lot of help with walking and/or transfers;A lot of help with bathing/dressing/bathroom;Assistance with cooking/housework;Assist for transportation;Help with stairs or ramp for entrance    Equipment Recommendations Hospital bed (lift chair)   Recommendations for Other Services       Functional Status Assessment Patient has had a recent decline in their functional status and demonstrates the ability to make significant improvements in function in a reasonable and predictable amount of time.     Precautions / Restrictions Precautions Precautions: Fall Restrictions Weight Bearing Restrictions: No      Mobility  Bed Mobility Overal bed mobility: Needs Assistance Bed Mobility: Supine to Sit     Supine to sit: Min assist     General bed mobility comments: A little assist needed for supine to sit for LEs and trunk elevation    Transfers Overall transfer level: Needs assistance   Transfers: Sit to/from Stand Sit to Stand: Mod assist           General transfer comment: Pt stood partially with flexed posture with Mod assist and bil HHa.  Unable to take more than 2 steps to [facility] and pt fatigued.    Ambulation/Gait                  Stairs            Wheelchair Mobility    Modified Rankin (Stroke Patients Only)       Balance Overall balance assessment: Needs assistance Sitting-balance support: No upper extremity supported, Feet supported Sitting balance-Leahy Scale: Fair     Standing balance support: Bilateral upper extremity supported, During functional activity Standing balance-Leahy Scale: Poor Standing balance comment: relies on bil UE support  Pertinent Vitals/Pain Pain Assessment Pain Assessment: No/denies pain    Home Living Family/patient expects to be discharged to:: Private residence Living Arrangements: Spouse/significant other Available Help at Discharge: Family;Available 24 hours/day Type of Home: House Home Access: Stairs to enter Entrance Stairs-Rails: Left Entrance Stairs-Number of Steps: 3   Home Layout: One level (Sunken living room with step down without rail) Home Equipment: Shower seat - built Medical sales representative (2  wheels);Grab bars - tub/shower;Cane - single point;Adaptive equipment;Rollator (4 wheels)      Prior Function Prior Level of Function : Independent/Modified Independent;History of Falls (last six months)             Mobility Comments: occaisonally walks with a RW ADLs Comments: independent, was able to shower without issue prior to ED admission     Hand Dominance   Dominant Hand: Right    Extremity/Trunk Assessment   Upper Extremity Assessment Upper Extremity Assessment: Defer to OT evaluation    Lower Extremity Assessment Lower Extremity Assessment: RLE deficits/detail;LLE deficits/detail RLE Deficits / Details: grossly 3-/5 LLE Deficits / Details: grossly 3-/5    Cervical / Trunk Assessment Cervical / Trunk Assessment: Kyphotic  Communication   Communication: No difficulties  Cognition Arousal/Alertness: Awake/alert Behavior During Therapy: WFL for tasks assessed/performed Overall Cognitive Status: Within Functional Limits for tasks assessed                                          General Comments General comments (skin integrity, edema, etc.): 83 bpm, 100%2L    Exercises General Exercises - Lower Extremity Ankle Circles/Pumps: AROM, Both, 10 reps, Supine Long Arc Quad: AROM, Both, 5 reps, Seated Hip Flexion/Marching: AROM, Both, 10 reps, Supine   Assessment/Plan    PT Assessment Patient needs continued PT services  PT Problem List Decreased balance;Decreased activity tolerance;Decreased mobility;Decreased knowledge of use of DME;Decreased safety awareness;Decreased knowledge of precautions;Cardiopulmonary status limiting activity       PT Treatment Interventions DME instruction;Gait training;Functional mobility training;Balance training;Therapeutic exercise;Therapeutic activities;Stair training;Patient/family education    PT Goals (Current goals can be found in the Care Plan section)  Acute Rehab PT Goals Patient Stated Goal: to go  home PT Goal Formulation: With patient Time For Goal Achievement: 07/16/22 Potential to Achieve Goals: Good    Frequency Min 3X/week     Co-evaluation               AM-PAC PT "6 Clicks" Mobility  Outcome Measure Help needed turning from your back to your side while in a flat bed without using bedrails?: A Little Help needed moving from lying on your back to sitting on the side of a flat bed without using bedrails?: A Lot Help needed moving to and from a bed to a chair (including a wheelchair)?: Total Help needed standing up from a chair using your arms (e.g., wheelchair or bedside chair)?: A Lot Help needed to walk in hospital room?: Total Help needed climbing 3-5 steps with a railing? : Total 6 Click Score: 10    End of Session Equipment Utilized During Treatment: Gait belt;Oxygen Activity Tolerance: Patient limited by fatigue Patient left: with call bell/phone within reach;with family/visitor present (on stretcher) Nurse Communication: Mobility status (notified that pt requests strep swab - notified MD as well.) PT Visit Diagnosis: Unsteadiness on feet (R26.81)    Time: 8469-6295 PT Time Calculation (min) (ACUTE ONLY): 28 min   Charges:  PT Evaluation $PT Eval Moderate Complexity: 1 Mod PT Treatments $Therapeutic Activity: 8-22 mins        [facility] M,PT Acute Rehab Services [phone]   Alvira Philips 07/02/2022, 1:50 PM

## 2022-07-03 DIAGNOSIS — N182 Chronic kidney disease, stage 2 (mild): Secondary | ICD-10-CM | POA: Diagnosis not present

## 2022-07-03 DIAGNOSIS — G9340 Encephalopathy, unspecified: Secondary | ICD-10-CM | POA: Diagnosis not present

## 2022-07-03 DIAGNOSIS — N390 Urinary tract infection, site not specified: Secondary | ICD-10-CM | POA: Diagnosis not present

## 2022-07-03 DIAGNOSIS — E1142 Type 2 diabetes mellitus with diabetic polyneuropathy: Secondary | ICD-10-CM | POA: Diagnosis not present

## 2022-07-03 LAB — CBC WITH DIFFERENTIAL/PLATELET
Abs Immature Granulocytes: 0.03 10*3/uL (ref 0.00–0.07)
Basophils Absolute: 0 10*3/uL (ref 0.0–0.1)
Basophils Relative: 0 %
Eosinophils Absolute: 0.5 10*3/uL (ref 0.0–0.5)
Eosinophils Relative: 7 %
HCT: 32.7 % — ABNORMAL LOW (ref 36.0–46.0)
Hemoglobin: 11 g/dL — ABNORMAL LOW (ref 12.0–15.0)
Immature Granulocytes: 0 %
Lymphocytes Relative: 11 %
Lymphs Abs: 0.7 10*3/uL (ref 0.7–4.0)
MCH: 30.5 pg (ref 26.0–34.0)
MCHC: 33.6 g/dL (ref 30.0–36.0)
MCV: 90.6 fL (ref 80.0–100.0)
Monocytes Absolute: 0.4 10*3/uL (ref 0.1–1.0)
Monocytes Relative: 6 %
Neutro Abs: 5.3 10*3/uL (ref 1.7–7.7)
Neutrophils Relative %: 76 %
Platelets: 293 10*3/uL (ref 150–400)
RBC: 3.61 MIL/uL — ABNORMAL LOW (ref 3.87–5.11)
RDW: 13.2 % (ref 11.5–15.5)
WBC: 7 10*3/uL (ref 4.0–10.5)
nRBC: 0 % (ref 0.0–0.2)

## 2022-07-03 LAB — COMPREHENSIVE METABOLIC PANEL
ALT: 30 U/L (ref 0–44)
AST: 25 U/L (ref 15–41)
Albumin: 2.3 g/dL — ABNORMAL LOW (ref 3.5–5.0)
Alkaline Phosphatase: 57 U/L (ref 38–126)
Anion gap: 10 (ref 5–15)
BUN: 13 mg/dL (ref 8–23)
CO2: 28 mmol/L (ref 22–32)
Calcium: 8.8 mg/dL — ABNORMAL LOW (ref 8.9–10.3)
Chloride: 97 mmol/L — ABNORMAL LOW (ref 98–111)
Creatinine, Ser: 1 mg/dL (ref 0.44–1.00)
GFR, Estimated: 60 mL/min — ABNORMAL LOW (ref >60)
Glucose, Bld: 127 mg/dL — ABNORMAL HIGH (ref 70–99)
Potassium: 3.6 mmol/L (ref 3.5–5.1)
Sodium: 135 mmol/L (ref 135–145)
Total Bilirubin: 0.2 mg/dL — ABNORMAL LOW (ref 0.3–1.2)
Total Protein: 4.9 g/dL — ABNORMAL LOW (ref 6.5–8.1)

## 2022-07-03 LAB — GLUCOSE, CAPILLARY
Glucose-Capillary: 143 mg/dL — ABNORMAL HIGH (ref 70–99)
Glucose-Capillary: 153 mg/dL — ABNORMAL HIGH (ref 70–99)
Glucose-Capillary: 123 mg/dL — ABNORMAL HIGH (ref 70–99)

## 2022-07-03 LAB — MAGNESIUM: Magnesium: 1.1 mg/dL — ABNORMAL LOW (ref 1.7–2.4)

## 2022-07-03 MED ORDER — MAGNESIUM SULFATE 4 GM/100ML IV SOLN
4.0000 g | Freq: Once | INTRAVENOUS | Status: AC
  Administered 2022-07-03: 4 g via INTRAVENOUS
  Filled 2022-07-03: qty 100

## 2022-07-03 MED ORDER — PHENOL 1.4 % MT LIQD
1.0000 | OROMUCOSAL | Status: DC | PRN
  Administered 2022-07-03: 1 via OROMUCOSAL
  Filled 2022-07-03: qty 177

## 2022-07-03 MED ORDER — SODIUM CHLORIDE 0.9 % IV SOLN: INTRAVENOUS | Status: DC

## 2022-07-03 NOTE — Progress Notes (Signed)
PROGRESS NOTE    Grace [name]  XTK:[id number] DOB: 1950-11-25 DOA: 07/01/2022 PCP: Unk Pinto, MD   Brief Narrative:  71 y.o. female with medical history significant of diabetes, neuropathy, hyperlipidemia, OSA on CPAP, RLS, CKD 2, fatty liver, recurrent UTI and was supposed to be on antibiotic as an outpatient currently presented with altered mental status.  On presentation, she was febrile to 104.2.  COVID-19 and influenza testing negative.  UA suggestive of UTI.  Chest x-ray showed no acute abnormality.  CT head showed no acute abnormality.  CT of the chest, abdomen and pelvis showed no evidence of infection, 4 mm pulmonary nodule.  She was started on IV fluids and antibiotics.  Assessment & Plan:   Acute metabolic encephalopathy UTI: Present on admission Lactic acidosis -Patient has history of recurrent UTI and was supposed to be on antibiotics currently as an outpatient.  Outpatient urine culture on 06/25/2022 had shown E. coli  -Presented with altered mental status and was found to be febrile, intermittently tachycardic, intermittently tachypneic with UA suggestive of UTI.  CT head along with CT chest, abdomen and pelvis were unremarkable -Mental status has much improved. Continue Rocephin.  Cultures negative so far. -Consider outpatient urology evaluation and follow-up -Oral intake still poor.  Decrease IV fluids to 50 cc an hour.  Physical deconditioning -Patient is very deconditioned and extremely weak.  PT recommended SNF placement.  For now, patient is denying it. -Asked the patient and husband to reconsider going to SNF.  We will ask PT to reevaluate the patient.  Patient's husband thinks that she is very weak and not ready to come home today.  Hypomagnesemia -Replace.  Repeat a.m. labs  Leukocytosis -Resolved  CKD stage II -creatinine stable.  Monitor  Diabetes mellitus type 2 with hyperglycemia Diabetic neuropathy -Continue CBGs with SSI.  Continue on  duloxetine  OSA -Currently no longer on CPAP   DVT prophylaxis: Lovenox Code Status: Full Family Communication: Husband  disposition Plan: Status is: inpatient because: Of need for IV antibiotics and fluids   Consultants: None  Procedures: None  Antimicrobials: Rocephin from 07/01/2022 onwards   Subjective: Patient seen and examined at bedside.  Awake, answers some questions appropriately.  Oral intake is still poor as per the husband at bedside.  Patient still feels very weak and husband thinks that patient is not yet ready to come home.  No overnight fever or vomiting reported.   Objective: Vitals:   07/02/22 2334 07/03/22 0430 07/03/22 0500 07/03/22 0755  BP: (!) 143/60 (!) 157/72  (!) 127/44  Pulse: 91 90  89  Resp: '16 20  17  '$ Temp: 98.8 F (37.1 C) 98.9 F (37.2 C)  98.1 F (36.7 C)  TempSrc: Oral Oral  Oral  SpO2: 95% 93%  99%  Weight:   68 kg   Height:        Intake/Output Summary (Last 24 hours) at 07/03/2022 0915 Last data filed at 07/03/2022 9924 Gross per 24 hour  Intake 1003.09 ml  Output 1550 ml  Net -546.91 ml   Filed Weights   07/01/22 1508 07/02/22 0850 07/03/22 0500  Weight: 67 kg 68 kg 68 kg    Examination:  General: On room air.  No distress.  Looks chronically ill and deconditioned. ENT/neck: No thyromegaly.  JVD is not elevated  respiratory: Decreased breath sounds at bases bilaterally with some crackles; no wheezing  CVS: S1-S2 heard, rate controlled currently Abdominal: Soft, nontender, slightly distended; no organomegaly, l bowel sounds are  heard Extremities: Trace lower extremity edema; no cyanosis  CNS: Awake and alert.  Answers some questions appropriately.  Slow to respond.  Poor historian.  No focal neurologic deficit.  Moves extremities Lymph: No obvious lymphadenopathy Skin: No obvious ecchymosis/lesions  psych: Not agitated.  Extremely flat affect.   Musculoskeletal: No obvious joint swelling/deformity     Data  Reviewed: I have personally reviewed following labs and imaging studies  CBC: Recent Labs  Lab 07/01/22 1433 07/01/22 1634 07/02/22 0023 07/03/22 0418  WBC 5.6  --  11.6* 7.0  NEUTROABS 5.1  --   --  5.3  HGB 12.3 11.6* 10.5* 11.0*  HCT 37.6 34.0* 31.8* 32.7*  MCV 92.2  --  91.9 90.6  PLT 357  --  347 119    Basic Metabolic Panel: Recent Labs  Lab 07/01/22 1433 07/01/22 1634 07/02/22 0023 07/03/22 0418  NA 135 135 135 135  K 4.1 3.7 4.2 3.6  CL 97*  --  99 97*  CO2 25  --  27 28  GLUCOSE 217*  --  245* 127*  BUN 15  --  15 13  CREATININE 1.02*  --  1.03* 1.00  CALCIUM 9.4  --  8.6* 8.8*  MG  --   --   --  1.1*    GFR: Estimated Creatinine Clearance: 48.2 mL/min (by C-G formula based on SCr of 1 mg/dL). Liver Function Tests: Recent Labs  Lab 07/01/22 1433 07/02/22 0023 07/03/22 0418  AST 21 33 25  ALT '14 23 30  '$ ALKPHOS 62 49 57  BILITOT 0.6 0.4 0.2*  PROT 6.4* 5.2* 4.9*  ALBUMIN 3.5 2.6* 2.3*    No results for input(s): "LIPASE", "AMYLASE" in the last 168 hours. No results for input(s): "AMMONIA" in the last 168 hours. Coagulation Profile: Recent Labs  Lab 07/01/22 1433  INR 1.0    Cardiac Enzymes: No results for input(s): "CKTOTAL", "CKMB", "CKMBINDEX", "TROPONINI" in the last 168 hours. BNP (last 3 results) No results for input(s): "PROBNP" in the last 8760 hours. HbA1C: No results for input(s): "HGBA1C" in the last 72 hours. CBG: Recent Labs  Lab 07/02/22 0847 07/02/22 1216 07/02/22 1624 07/02/22 2134 07/03/22 0611  GLUCAP 196* 176* 125* 114* 143*    Lipid Profile: No results for input(s): "CHOL", "HDL", "LDLCALC", "TRIG", "CHOLHDL", "LDLDIRECT" in the last 72 hours. Thyroid Function Tests: No results for input(s): "TSH", "T4TOTAL", "FREET4", "T3FREE", "THYROIDAB" in the last 72 hours. Anemia Panel: No results for input(s): "VITAMINB12", "FOLATE", "FERRITIN", "TIBC", "IRON", "RETICCTPCT" in the last 72 hours. Sepsis Labs: Recent  Labs  Lab 07/01/22 1433 07/01/22 1704  LATICACIDVEN 2.3* 2.0*     Recent Results (from the past 240 hour(s))  Urine Culture     Status: Abnormal   Collection Time: 06/25/22 10:50 AM   Specimen: Urine  Result Value Ref Range Status   MICRO NUMBER: [id number]  Final   SPECIMEN QUALITY: Adequate  Final   Sample Source URINE  Final   STATUS: FINAL  Final   ISOLATE 1: Escherichia coli (A)  Final    Comment: Greater than 100,000 CFU/mL of Escherichia coli      Susceptibility   Escherichia coli - URINE CULTURE, REFLEX    AMOX/CLAVULANIC >=32 Resistant     AMPICILLIN >=32 Resistant     AMPICILLIN/SULBACTAM >=32 Resistant     CEFAZOLIN* 8 Resistant      * For uncomplicated UTI caused by E. coli, K. pneumoniae or P. mirabilis: Cefazolin is susceptible if MIC <  32 mcg/mL and predicts susceptible to the oral agents cefaclor, cefdinir, cefpodoxime, cefprozil, cefuroxime, cephalexin and loracarbef.     CEFTAZIDIME <=1 Sensitive     CEFEPIME <=1 Sensitive     CEFTRIAXONE <=1 Sensitive     CIPROFLOXACIN <=0.25 Sensitive     LEVOFLOXACIN <=0.12 Sensitive     GENTAMICIN <=1 Sensitive     IMIPENEM <=0.25 Sensitive     NITROFURANTOIN <=16 Sensitive     PIP/TAZO <=4 Sensitive     TOBRAMYCIN <=1 Sensitive     TRIMETH/SULFA* <=20 Sensitive      * For uncomplicated UTI caused by E. coli, K. pneumoniae or P. mirabilis: Cefazolin is susceptible if MIC <32 mcg/mL and predicts susceptible to the oral agents cefaclor, cefdinir, cefpodoxime, cefprozil, cefuroxime, cephalexin and loracarbef. Legend: S = Susceptible  I = Intermediate R = Resistant  NS = Not susceptible * = Not tested  NR = Not reported **NN = See antimicrobic comments   MICROSCOPIC MESSAGE     Status: None   Collection Time: 06/25/22 10:50 AM  Result Value Ref Range Status   Note   Final    Comment: This urine was analyzed for the presence of WBC,  RBC, bacteria, casts, and other formed elements.  Only those elements seen  were reported. . .   Resp Panel by RT-PCR (Flu A&B, Covid) Anterior Nasal Swab     Status: None   Collection Time: 07/01/22  3:04 PM   Specimen: Anterior Nasal Swab  Result Value Ref Range Status   SARS Coronavirus 2 by RT PCR NEGATIVE NEGATIVE Final    Comment: (NOTE) SARS-CoV-2 target nucleic acids are NOT DETECTED.  The SARS-CoV-2 RNA is generally detectable in upper respiratory specimens during the acute phase of infection. The lowest concentration of SARS-CoV-2 viral copies this assay can detect is 138 copies/mL. A negative result does not preclude SARS-Cov-2 infection and should not be used as the sole basis for treatment or other patient management decisions. A negative result may occur with  improper specimen collection/handling, submission of specimen other than nasopharyngeal swab, presence of viral mutation(s) within the areas targeted by this assay, and inadequate number of viral copies(<138 copies/mL). A negative result must be combined with clinical observations, patient history, and epidemiological information. The expected result is Negative.  Fact Sheet for Patients:  EntrepreneurPulse.com.au  Fact Sheet for Healthcare Providers:  IncredibleEmployment.be  This test is no t yet approved or cleared by the [place] FDA and  has been authorized for detection and/or diagnosis of SARS-CoV-2 by FDA under an Emergency Use Authorization (EUA). This EUA will remain  in effect (meaning this test can be used) for the duration of the COVID-19 declaration under Section 564(b)(1) of the Act, 21 U.S.C.section 360bbb-3(b)(1), unless the authorization is terminated  or revoked sooner.       Influenza A by PCR NEGATIVE NEGATIVE Final   Influenza B by PCR NEGATIVE NEGATIVE Final    Comment: (NOTE) The Xpert Xpress SARS-CoV-2/FLU/RSV plus assay is intended as an aid in the diagnosis of influenza from Nasopharyngeal swab specimens  and should not be used as a sole basis for treatment. Nasal washings and aspirates are unacceptable for Xpert Xpress SARS-CoV-2/FLU/RSV testing.  Fact Sheet for Patients: EntrepreneurPulse.com.au  Fact Sheet for Healthcare Providers: IncredibleEmployment.be  This test is not yet approved or cleared by the [place] FDA and has been authorized for detection and/or diagnosis of SARS-CoV-2 by FDA under an Emergency Use Authorization (EUA). This EUA will remain  in effect (meaning this test can be used) for the duration of the COVID-19 declaration under Section 564(b)(1) of the Act, 21 U.S.C. section 360bbb-3(b)(1), unless the authorization is terminated or revoked.  Performed at [facility], [place] [address] [place], [place] [address]   Blood Culture (routine x 2)     Status: None (Preliminary result)   Collection Time: 07/01/22  3:04 PM   Specimen: BLOOD  Result Value Ref Range Status   Specimen Description BLOOD SITE NOT SPECIFIED  Final   Special Requests   Final    BOTTLES DRAWN AEROBIC AND ANAEROBIC Blood Culture adequate volume   Culture   Final    NO GROWTH 2 DAYS Performed at [facility], [address] [place], [place] [address]    Report Status PENDING  Incomplete  Urine Culture     Status: None (Preliminary result)   Collection Time: 07/01/22  3:04 PM   Specimen: In/Out Cath Urine  Result Value Ref Range Status   Specimen Description IN/OUT CATH URINE  Final   Special Requests NONE  Final   Culture   Final    CULTURE REINCUBATED FOR BETTER GROWTH Performed at [facility], [place] [address], [place], [place] [address]    Report Status PENDING  Incomplete  Blood Culture (routine x 2)     Status: None (Preliminary result)   Collection Time: 07/01/22  3:09 PM   Specimen: BLOOD  Result Value Ref Range Status   Specimen Description BLOOD SITE NOT SPECIFIED  Final   Special Requests   Final    BOTTLES DRAWN  AEROBIC AND ANAEROBIC Blood Culture adequate volume   Culture   Final    NO GROWTH 2 DAYS Performed at [facility], [address], [place], [place] [address]    Report Status PENDING  Incomplete  Group A Strep by PCR     Status: None   Collection Time: 07/02/22 11:38 AM   Specimen: Throat; Sterile Swab  Result Value Ref Range Status   Group A Strep by PCR NOT DETECTED NOT DETECTED Final    Comment: Performed at [facility], [place] [address], [place], [place] [address]         Radiology Studies: CT CHEST ABDOMEN PELVIS WO CONTRAST  Addendum Date: 07/02/2022   ADDENDUM REPORT: 07/02/2022 08:40 ADDENDUM: The enlargement of the proximal urethra secondary to a bladder cystocele rather than the Foley catheter as Foley catheter is not present in conversation with ER physician. Electronically Signed   By: Suzy Bouchard M.D.   On: 07/02/2022 08:40   Result Date: 07/02/2022 CLINICAL DATA:  Sepsis, mental status change. Unknown source of infection. EXAM: CT CHEST, ABDOMEN AND PELVIS WITHOUT CONTRAST TECHNIQUE: Multidetector CT imaging of the chest, abdomen and pelvis was performed following the standard protocol without IV contrast. RADIATION DOSE REDUCTION: This exam was performed according to the departmental dose-optimization program which includes automated exposure control, adjustment of the mA and/or kV according to patient size and/or use of iterative reconstruction technique. COMPARISON:  None Available. FINDINGS: CT CHEST FINDINGS Cardiovascular: No significant vascular findings. Normal heart size. No pericardial effusion. Mediastinum/Nodes: No axillary or supraclavicular adenopathy. No mediastinal or hilar adenopathy. No pericardial fluid. Esophagus normal. Lungs/Pleura: Single small 4 mm RIGHT upper lobe pulmonary nodule (image 38/4) no airspace disease. No pleural fluid. Airways are normal Musculoskeletal: No aggressive osseous lesion. CT ABDOMEN AND PELVIS FINDINGS  Hepatobiliary: No focal hepatic lesion. No biliary ductal  dilatation. Gallbladder is normal. Common bile duct is normal. Pancreas: Pancreas is normal. No ductal dilatation. No pancreatic inflammation. Spleen: Normal spleen Adrenals/urinary tract: Adrenal glands and kidneys are normal. The ureters and bladder normal. The Foley catheter bulb is expanded in proximal urethra just below the base the bladder. (Axial image 116/3 in sagittal image 52/6.) The bladder is mildly distended. Stomach/Bowel: Stomach, small bowel, appendix, and cecum are normal. The colon and rectosigmoid colon are normal. Vascular/Lymphatic: Abdominal aorta is normal caliber with atherosclerotic calcification. There is no retroperitoneal or periportal lymphadenopathy. No pelvic lymphadenopathy. Reproductive: Post hysterectomy.  Adnexa unremarkable Other: No free fluid or free air. No abscess or infection identified. Musculoskeletal: RIGHT hip prosthetic.  No aggressive osseous lesion IMPRESSION: 1. Foley catheter with retention bulb expanded in the proximal urethra. Recommend repositioning to within the bladder lumen. Bladder mildly distended. 2. There is no source of infection on noncontrast chest abdomen pelvis CT. 3. 4 mm right solid pulmonary nodule within the upper lobe. Per Fleischner Society Guidelines, if patient is low risk for malignancy, no routine follow-up imaging is recommended. If patient is high risk for malignancy, a non-contrast Chest CT at 12 months is optional. If performed and the nodule is stable at 12 months, no further follow-up is recommended. These guidelines do not apply to immunocompromised patients and patients with cancer. Follow up in patients with significant comorbidities as clinically warranted. For lung cancer screening, adhere to Lung-RADS guidelines. Reference: Radiology. 2017; 284(1):228-43. Electronically Signed: By: Suzy Bouchard M.D. On: 07/01/2022 16:35   CT Head Wo Contrast  Result Date:  07/01/2022 CLINICAL DATA:  Mental status change sepsis EXAM: CT HEAD WITHOUT CONTRAST TECHNIQUE: Contiguous axial images were obtained from the base of the skull through the vertex without intravenous contrast. RADIATION DOSE REDUCTION: This exam was performed according to the departmental dose-optimization program which includes automated exposure control, adjustment of the mA and/or kV according to patient size and/or use of iterative reconstruction technique. COMPARISON:  None Available. FINDINGS: Brain: Mild atrophy without hydrocephalus. Moderate white matter hypodensity bilaterally is symmetric and likely chronic. Negative for acute infarct, hemorrhage, mass Vascular: Negative for hyperdense vessel Skull: Negative Sinuses/Orbits: Mild mucosal edema paranasal sinuses. Bilateral cataract extraction Other: None IMPRESSION: No acute abnormality. Atrophy and moderate chronic microvascular ischemic change in the white matter. Electronically Signed   By: Franchot Gallo M.D.   On: 07/01/2022 16:26   DG Chest Port 1 View  Result Date: 07/01/2022 CLINICAL DATA:  Possible sepsis weakness EXAM: PORTABLE CHEST 1 VIEW COMPARISON:  03/24/2022 FINDINGS: The heart size and mediastinal contours are within normal limits. Aortic atherosclerosis. Both lungs are clear. The visualized skeletal structures are unremarkable. Probable skin fold artifact over left lateral chest. IMPRESSION: No acute airspace disease. Skin fold artifact favored over pneumothorax at the left lateral chest, attention on follow-up chest x-ray. Electronically Signed   By: Donavan Foil M.D.   On: 07/01/2022 15:30        Scheduled Meds:  DULoxetine  30 mg Oral Daily   enoxaparin (LOVENOX) injection  40 mg Subcutaneous Q24H   insulin aspart  0-15 Units Subcutaneous TID WC   sodium chloride flush  3 mL Intravenous Q12H   Continuous Infusions:  cefTRIAXone (ROCEPHIN)  IV 2 g (07/02/22 1516)   magnesium sulfate bolus IVPB             Aline August, MD [facility] 07/03/2022, 9:15 AM

## 2022-07-03 NOTE — TOC Initial Note (Signed)
Transition of Care [facility]) - Initial/Assessment Note    Patient Details  Name: Grace [name] MRN: [id number] Date of Birth: 20-May-1951  [facility] of [facility]) CM/SW Contact:    Pollie Friar, RN Phone Number: 07/03/2022, 10:41 AM  Clinical Narrative:                 Pt is from home with her spouse. Pt states he is with her most of the time and can provide some assistance with ADL's.  Current recommendations are for SNF rehab. Pt is refusing SNF rehab. CM inquired about home therapy and she is refusing this also. She states she has had HH in the past and doesn't feels she needs their services again.  Pt states her spouse provides needed transportation.  She manages her own medications and denies any issues.  CM inquired about a hospital bed. Pt states she has all needed DME at home.  TOC following.  Expected Discharge Plan: Home/Self Care Barriers to Discharge: Continued Medical Work up   Patient Goals and CMS Choice     Choice offered to / list presented to : Patient  Expected Discharge Plan and Services Expected Discharge Plan: Home/Self Care   Discharge Planning Services: CM Consult   Living arrangements for the past 2 months: Single Family Home                                      Prior Living Arrangements/Services Living arrangements for the past 2 months: Single Family Home Lives with:: Spouse Patient language and need for interpreter reviewed:: Yes Do you feel safe going back to the place where you live?: Yes      Need for Family Participation in Patient Care: Yes (Comment) Care giver support system in place?: Yes (comment) Current home services: DME (walker/ shower seat/) Criminal Activity/Legal Involvement Pertinent to Current Situation/Hospitalization: No - Comment as needed  Activities of Daily Living   ADL Screening (condition at time of admission) Patient's cognitive ability adequate to safely complete daily activities?: Yes Is the  patient deaf or have difficulty hearing?: No Does the patient have difficulty seeing, even when wearing glasses/contacts?: No Does the patient have difficulty concentrating, remembering, or making decisions?: No Does the patient have difficulty dressing or bathing?: Yes Does the patient have difficulty walking or climbing stairs?: Yes Weakness of Legs: Both Weakness of Arms/Hands: None  Permission Sought/Granted                  Emotional Assessment Appearance:: Appears stated age Attitude/Demeanor/Rapport: Engaged Affect (typically observed): Accepting Orientation: : Oriented to Self, Oriented to Place, Oriented to  Time, Oriented to Situation   Psych Involvement: No (comment)  Admission diagnosis:  UTI (urinary tract infection) [N39.0] Acute encephalopathy [G93.40] Sepsis without acute organ dysfunction, due to unspecified organism [facility]) [A41.9] Patient Active Problem List   Diagnosis Date Noted   Acute encephalopathy 07/01/2022   UTI (urinary tract infection) 07/01/2022   Cellulitis 03/25/2022   Epiretinal membrane, left eye 12/19/2021   Lattice corneal dystrophy of both eyes 10/01/2021   CKD (chronic kidney disease) stage 2, GFR 60-89 ml/min 08/31/2020   Osteoporosis 08/31/2020   OSA on CPAP 05/08/2020   Non-restorative sleep 12/08/2019   Snoring 12/08/2019   RLS (restless legs syndrome) 12/08/2019   Diabetic macular edema of right eye with proliferative retinopathy associated with type 2 diabetes mellitus ([place]) 12/01/2019   Proliferative  diabetic retinopathy of left eye with macular edema associated with type 2 diabetes mellitus ([place]) 12/01/2019   Right epiretinal membrane 12/01/2019   Hyperlipidemia associated with type 2 diabetes mellitus ([place]) 08/07/2019   Diabetes mellitus type 2 with peripheral artery disease ([place]) 08/07/2019   Cholelithiases 07/20/2018   Fatty liver 07/05/2018   T2_NIDDM 08/30/2014   Vitamin D deficiency 08/30/2014   Foot drop, bilateral  07/31/2014   Diabetic polyneuropathy ([place]) 07/28/2013   PCP:  Unk Pinto, MD Pharmacy:   [id number] [place] [place] [address] [address]Phone: [phone]: [phone]    Social Determinants of Health (SDOH) Interventions    Readmission Risk Interventions    03/27/2022   11:59 AM  Readmission Risk Prevention Plan  Post Dischage Appt Complete  Medication Screening Complete  Transportation Screening Complete

## 2022-07-03 NOTE — Progress Notes (Signed)
Mobility Specialist: Progress Note   07/03/22 1824  Mobility  Activity Ambulated with assistance in hallway  Level of Assistance Moderate assist, patient does 50-74%  Assistive Device Front wheel walker  Distance Ambulated (ft) 170 ft  Activity Response Tolerated well  Mobility Referral Yes  $Mobility charge 1 Mobility   Pt received in the bed and agreeable to mobility. MinA with bed mobility and modA to stand. Ambulated with AFOs which pt has in the room. Min verbal cues and physical assist for RW management. No c/o throughout. Pt back to bed after session with call bell and phone in reach.   [facility] Adeola Dennen Mobility Specialist Please contact via SecureChat or Rehab office at [phone]

## 2022-07-04 DIAGNOSIS — G9340 Encephalopathy, unspecified: Secondary | ICD-10-CM | POA: Diagnosis not present

## 2022-07-04 DIAGNOSIS — E1151 Type 2 diabetes mellitus with diabetic peripheral angiopathy without gangrene: Secondary | ICD-10-CM | POA: Diagnosis not present

## 2022-07-04 DIAGNOSIS — N390 Urinary tract infection, site not specified: Secondary | ICD-10-CM | POA: Diagnosis not present

## 2022-07-04 DIAGNOSIS — E1142 Type 2 diabetes mellitus with diabetic polyneuropathy: Secondary | ICD-10-CM | POA: Diagnosis not present

## 2022-07-04 LAB — GLUCOSE, CAPILLARY: Glucose-Capillary: 170 mg/dL — ABNORMAL HIGH (ref 70–99)

## 2022-07-04 MED ORDER — CEFADROXIL 500 MG PO CAPS: 1000.0000 mg | ORAL_CAPSULE | Freq: Two times a day (BID) | ORAL | 0 refills | Status: AC

## 2022-07-04 MED ORDER — METFORMIN HCL ER 500 MG PO TB24: 1000.0000 mg | ORAL_TABLET | Freq: Two times a day (BID) | ORAL | Status: DC

## 2022-07-04 NOTE — Progress Notes (Signed)
Discharge instructions reviewed with pt.  Copy of instructions given to pt. Informed script sent to her pharmacy for pick up. Pt to be d/c'd via wheelchair with belongings, with  husband (gone to get car at time of instruction review).           To be escorted by unit staff.

## 2022-07-04 NOTE — Plan of Care (Signed)

## 2022-07-04 NOTE — Progress Notes (Signed)
Order to discharge pt home - pt to work with PT prior to discharge.

## 2022-07-04 NOTE — Progress Notes (Signed)
Physical Therapy Treatment Patient Details Name: Grace [name] MRN: [id number] DOB: 06/19/1951 Today's Date: 07/04/2022   History of Present Illness Patient is a 71 yo female presenting to the ED on 03/24/22 due to weakness. Patient diagnosed with E coli UTI 1 week prior. Admitted 03/25/22 with cellulitis of LLE and L face and sepsis. PMH includes: DM2, sleep apnea, HTN and bilateral foot drop secondary to neuropathy    PT Comments    Patient progressing well towards PT goals. Eager to return home. Session focused on stair training, transfers/strengthening and gait training. Tolerated gait training and stairs with Min guard assist/supervision for safety. Due to slow gait speed, pt continues to be a fall risk. Spouse present for family training on stairs. Worked on functional strengthening sit to stands and encouraged them for HEP at home to build strength. Pt/spouse not interested in any DME or follow up services. Discussed recommendation to place grab bar for step to get into living room. Will continue to follow if still in the hospital but pt/family comfortable taking pt home today.   Recommendations for follow up therapy are one component of a multi-disciplinary discharge planning process, led by the attending physician.  Recommendations may be updated based on patient status, additional functional criteria and insurance authorization.  Follow Up Recommendations  No PT follow up (declining follow up) Can patient physically be transported by private vehicle: Yes   Assistance Recommended at Discharge Frequent or constant Supervision/Assistance  Patient can return home with the following A little help with walking and/or transfers;A little help with bathing/dressing/bathroom;Assistance with cooking/housework;Assist for transportation   Equipment Recommendations  None recommended by PT    Recommendations for Other Services       Precautions / Restrictions Precautions Precautions:  Fall Restrictions Weight Bearing Restrictions: No     Mobility  Bed Mobility   Bed Mobility: Supine to Sit     Supine to sit: Supervision, HOB elevated     General bed mobility comments: Use of rail and increased time to get EOB.    Transfers Overall transfer level: Needs assistance Equipment used: Rolling walker (2 wheels) Transfers: Sit to/from Stand Sit to Stand: Min guard           General transfer comment: Min guard for safety. Stood from EOB x6 with cues for technique/safety.    Ambulation/Gait Ambulation/Gait assistance: Supervision Gait Distance (Feet): 200 Feet Assistive device: Rolling walker (2 wheels) Gait Pattern/deviations: Step-through pattern, Decreased stride length Gait velocity: decreased Gait velocity interpretation: <1.31 ft/sec, indicative of household ambulator   General Gait Details: Slow, mostly steady gait wtih use of RW for support. 1 standing rest break to blow nose.   Stairs Stairs: Yes Stairs assistance: Min guard Stair Management: Step to pattern, One rail Left Number of Stairs: 2 General stair comments: Cues for technique/safety, using BUEs on left railt o simulate home. Spouse present and provided cues to assist safely.   Wheelchair Mobility    Modified Rankin (Stroke Patients Only)       Balance Overall balance assessment: Needs assistance Sitting-balance support: Feet supported, No upper extremity supported Sitting balance-Leahy Scale: Fair Sitting balance - Comments: Able to assist wtih doning shoes with AFOs, spouse does socks at baseline.   Standing balance support: During functional activity, Bilateral upper extremity supported Standing balance-Leahy Scale: Poor Standing balance comment: relies on bil UE support, able tot ake hands off RW to blow nose, close Min guard needed.  Cognition Arousal/Alertness: Awake/alert Behavior During Therapy: WFL for tasks  assessed/performed Overall Cognitive Status: Within Functional Limits for tasks assessed                                          Exercises Other Exercises Other Exercises: Sit to stand from EOB x5 with cues for slow descent    General Comments General comments (skin integrity, edema, etc.): Spouse present during session.Reports they have all the DME they need, discussed putting rails up in hallway for step down into living room.      Pertinent Vitals/Pain Pain Assessment Pain Assessment: No/denies pain    Home Living                          Prior Function            PT Goals (current goals can now be found in the care plan section) Progress towards PT goals: Progressing toward goals    Frequency    Min 3X/week      PT Plan Current plan remains appropriate    Co-evaluation              AM-PAC PT "6 Clicks" Mobility   Outcome Measure  Help needed turning from your back to your side while in a flat bed without using bedrails?: A Little Help needed moving from lying on your back to sitting on the side of a flat bed without using bedrails?: A Little Help needed moving to and from a bed to a chair (including a wheelchair)?: A Little Help needed standing up from a chair using your arms (e.g., wheelchair or bedside chair)?: A Little Help needed to walk in hospital room?: A Little Help needed climbing 3-5 steps with a railing? : A Little 6 Click Score: 18    End of Session Equipment Utilized During Treatment: Gait belt Activity Tolerance: Patient tolerated treatment well Patient left: in bed;with call bell/phone within reach;with family/visitor present (sitting EOB) Nurse Communication: Mobility status PT Visit Diagnosis: Unsteadiness on feet (R26.81)     Time: 3086-5784 PT Time Calculation (min) (ACUTE ONLY): 26 min  Charges:  $Gait Training: 8-22 mins $Therapeutic Activity: 8-22 mins                     Marisa Severin, PT,  DPT Acute Rehabilitation Services Secure chat preferred Office [phone]      Marguarite Arbour A [place] 07/04/2022, 10:20 AM

## 2022-07-04 NOTE — Discharge Summary (Signed)
Physician Discharge Summary  KYNDELL ZEISER XBM:[id number] DOB: [birth year] DOA: 07/01/2022  PCP: Unk Pinto, MD  Admit date: 07/01/2022 Discharge date: 07/04/2022  Admitted From: Home Disposition: Home.  Patient refused SNF placement  Recommendations for Outpatient Follow-up:  Follow up with PCP in 1 week with repeat CBC/BMP Outpatient evaluation and follow-up with urology Follow up in ED if symptoms worsen or new appear   Home Health: No.  Patient refused HH PT Equipment/Devices: None  Discharge Condition: Stable CODE STATUS: Full Diet recommendation: Heart healthy/carb modified  Brief/Interim Summary: 71 y.o. female with medical history significant of diabetes, neuropathy, hyperlipidemia, OSA on CPAP, RLS, CKD 2, fatty liver, recurrent UTI and was supposed to be on antibiotic as an outpatient currently presented with altered mental status.  On presentation, she was febrile to 104.2.  COVID-19 and influenza testing negative.  UA suggestive of UTI.  Chest x-ray showed no acute abnormality.  CT head showed no acute abnormality.  CT of the chest, abdomen and pelvis showed no evidence of infection, 4 mm pulmonary nodule.  She was started on IV fluids and antibiotics.  During the hospitalization, her mental status has improved.  She is currently hemodynamically stable with negative cultures.  PT recommended SNF placement: Patient declined SNF placement or HHPT.  She will be discharged home on oral antibiotics.  Discharge Diagnoses:   Acute metabolic encephalopathy UTI: Present on admission Lactic acidosis -Patient has history of recurrent UTI and was supposed to be on antibiotics currently as an outpatient.  Outpatient urine culture on 06/25/2022 had shown E. coli  -Presented with altered mental status and was found to be febrile, intermittently tachycardic, intermittently tachypneic with UA suggestive of UTI.  CT head along with CT chest, abdomen and pelvis were  unremarkable -Mental status has much improved.  Currently on Rocephin.  Cultures negative so far. -Consider outpatient urology evaluation and follow-up -Oral intake improving.  -Discharge home on oral cefadroxil for 5 more days.   Physical deconditioning -PT recommended SNF placement: Patient declined SNF placement or HHPT.     Hypomagnesemia -No labs today   leukocytosis -Resolved   CKD stage II -creatinine stable.  Outpatient follow-up.   Diabetes mellitus type 2 with hyperglycemia Diabetic neuropathy -Blood sugars on the lower side.  Hold glipizide till reevaluation by PCP.  Continue metformin.  Carb and fat diet.  Continue on duloxetine   OSA -Currently no longer on CPAP  Discharge Instructions  Discharge Instructions     Diet - low sodium heart healthy   Complete by: As directed    Diet Carb Modified   Complete by: As directed    Increase activity slowly   Complete by: As directed       Allergies as of 07/04/2022       Reactions   Gabapentin Swelling   Swelling in legs and feet        Medication List     STOP taking these medications    glimepiride 2 MG tablet Commonly known as: AMARYL       TAKE these medications    Accu-Chek Guide test strip Generic drug: glucose blood TEST UP TO 4 TIMES DAILY AS DIRECTED   acetaminophen 500 MG tablet Commonly known as: TYLENOL Take 1,000 mg by mouth every 8 (eight) hours as needed for moderate pain.   blood glucose meter kit and supplies Kit Dispense based on patient and insurance preference. Use 3 times daily as directed.   cefadroxil 500 MG capsule Commonly known as:  DURICEF Take 2 capsules (1,000 mg total) by mouth 2 (two) times daily for 5 days.   ciclopirox 8 % solution Commonly known as: Penlac Apply topically at bedtime. Apply over nail and surrounding skin. Apply daily over previous coat. After seven (7) days, may remove with alcohol and continue cycle.   Dialyvite Vitamin D 5000 125 MCG  (5000 UT) capsule Generic drug: Cholecalciferol Take 5,000 Units by mouth daily.   DULoxetine 30 MG capsule Commonly known as: CYMBALTA Take 1 capsule (30 mg total) by mouth daily.   Magnesium 250 MG Tabs Take 500 mg by mouth daily.   metFORMIN 500 MG 24 hr tablet Commonly known as: GLUCOPHAGE-XR Take 2 tablets (1,000 mg total) by mouth 2 (two) times daily.   YUMVS BEET ROOT-TART CHERRY PO Take 1 tablet by mouth daily.   zinc gluconate 50 MG tablet Take 50 mg by mouth daily.        Follow-up Information     Unk Pinto, MD. Schedule an appointment as soon as possible for a visit in 1 week(s).   Specialty: Internal Medicine Contact information: [address] [place] [address] [phone]         [place]. Schedule an appointment as soon as possible for a visit in 1 week(s).   Contact information: [place] [address] [phone]               Allergies  Allergen Reactions   Gabapentin Swelling    Swelling in legs and feet    Consultations: None   Procedures/Studies: CT CHEST ABDOMEN PELVIS WO CONTRAST  Addendum Date: 07/02/2022   ADDENDUM REPORT: 07/02/2022 08:40 ADDENDUM: The enlargement of the proximal urethra secondary to a bladder cystocele rather than the Foley catheter as Foley catheter is not present in conversation with ER physician. Electronically Signed   By: Suzy Bouchard M.D.   On: 07/02/2022 08:40   Result Date: 07/02/2022 CLINICAL DATA:  Sepsis, mental status change. Unknown source of infection. EXAM: CT CHEST, ABDOMEN AND PELVIS WITHOUT CONTRAST TECHNIQUE: Multidetector CT imaging of the chest, abdomen and pelvis was performed following the standard protocol without IV contrast. RADIATION DOSE REDUCTION: This exam was performed according to the departmental dose-optimization program which includes automated exposure control, adjustment of the mA and/or kV  according to patient size and/or use of iterative reconstruction technique. COMPARISON:  None Available. FINDINGS: CT CHEST FINDINGS Cardiovascular: No significant vascular findings. Normal heart size. No pericardial effusion. Mediastinum/Nodes: No axillary or supraclavicular adenopathy. No mediastinal or hilar adenopathy. No pericardial fluid. Esophagus normal. Lungs/Pleura: Single small 4 mm RIGHT upper lobe pulmonary nodule (image 38/4) no airspace disease. No pleural fluid. Airways are normal Musculoskeletal: No aggressive osseous lesion. CT ABDOMEN AND PELVIS FINDINGS Hepatobiliary: No focal hepatic lesion. No biliary ductal dilatation. Gallbladder is normal. Common bile duct is normal. Pancreas: Pancreas is normal. No ductal dilatation. No pancreatic inflammation. Spleen: Normal spleen Adrenals/urinary tract: Adrenal glands and kidneys are normal. The ureters and bladder normal. The Foley catheter bulb is expanded in proximal urethra just below the base the bladder. (Axial image 116/3 in sagittal image 52/6.) The bladder is mildly distended. Stomach/Bowel: Stomach, small bowel, appendix, and cecum are normal. The colon and rectosigmoid colon are normal. Vascular/Lymphatic: Abdominal aorta is normal caliber with atherosclerotic calcification. There is no retroperitoneal or periportal lymphadenopathy. No pelvic lymphadenopathy. Reproductive: Post hysterectomy.  Adnexa unremarkable Other: No free fluid or free air. No abscess or infection  identified. Musculoskeletal: RIGHT hip prosthetic.  No aggressive osseous lesion IMPRESSION: 1. Foley catheter with retention bulb expanded in the proximal urethra. Recommend repositioning to within the bladder lumen. Bladder mildly distended. 2. There is no source of infection on noncontrast chest abdomen pelvis CT. 3. 4 mm right solid pulmonary nodule within the upper lobe. Per Fleischner Society Guidelines, if patient is low risk for malignancy, no routine follow-up imaging  is recommended. If patient is high risk for malignancy, a non-contrast Chest CT at 12 months is optional. If performed and the nodule is stable at 12 months, no further follow-up is recommended. These guidelines do not apply to immunocompromised patients and patients with cancer. Follow up in patients with significant comorbidities as clinically warranted. For lung cancer screening, adhere to Lung-RADS guidelines. Reference: Radiology. 2017; 284(1):228-43. Electronically Signed: By: Suzy Bouchard M.D. On: 07/01/2022 16:35   CT Head Wo Contrast  Result Date: 07/01/2022 CLINICAL DATA:  Mental status change sepsis EXAM: CT HEAD WITHOUT CONTRAST TECHNIQUE: Contiguous axial images were obtained from the base of the skull through the vertex without intravenous contrast. RADIATION DOSE REDUCTION: This exam was performed according to the departmental dose-optimization program which includes automated exposure control, adjustment of the mA and/or kV according to patient size and/or use of iterative reconstruction technique. COMPARISON:  None Available. FINDINGS: Brain: Mild atrophy without hydrocephalus. Moderate white matter hypodensity bilaterally is symmetric and likely chronic. Negative for acute infarct, hemorrhage, mass Vascular: Negative for hyperdense vessel Skull: Negative Sinuses/Orbits: Mild mucosal edema paranasal sinuses. Bilateral cataract extraction Other: None IMPRESSION: No acute abnormality. Atrophy and moderate chronic microvascular ischemic change in the white matter. Electronically Signed   By: Franchot Gallo M.D.   On: 07/01/2022 16:26   DG Chest Port 1 View  Result Date: 07/01/2022 CLINICAL DATA:  Possible sepsis weakness EXAM: PORTABLE CHEST 1 VIEW COMPARISON:  03/24/2022 FINDINGS: The heart size and mediastinal contours are within normal limits. Aortic atherosclerosis. Both lungs are clear. The visualized skeletal structures are unremarkable. Probable skin fold artifact over left lateral  chest. IMPRESSION: No acute airspace disease. Skin fold artifact favored over pneumothorax at the left lateral chest, attention on follow-up chest x-ray. Electronically Signed   By: Donavan Foil M.D.   On: 07/01/2022 15:30      Subjective: Patient seen and examined at bedside.  Feels much better and wants to go home today.  Husband at bedside.  No fever, vomiting, shortness of breath reported.  Discharge Exam: Vitals:   07/04/22 0402 07/04/22 0723  BP: (!) 149/67 (!) 149/59  Pulse: 80 82  Resp: 16 17  Temp: 98.3 F (36.8 C) 98.6 F (37 C)  SpO2: 98% 94%    General: Pt is alert, awake, not in acute distress.  On room air. Cardiovascular: rate controlled, S1/S2 + Respiratory: bilateral decreased breath sounds at bases Abdominal: Soft, NT, ND, bowel sounds + Extremities: no edema, no cyanosis    The results of significant diagnostics from this hospitalization (including imaging, microbiology, ancillary and laboratory) are listed below for reference.     Microbiology: Recent Results (from the past 240 hour(s))  Urine Culture     Status: Abnormal   Collection Time: 06/25/22 10:50 AM   Specimen: Urine  Result Value Ref Range Status   MICRO NUMBER: [id number]  Final   SPECIMEN QUALITY: Adequate  Final   Sample Source URINE  Final   STATUS: FINAL  Final   ISOLATE 1: Escherichia coli (A)  Final    Comment:  Greater than 100,000 CFU/mL of Escherichia coli      Susceptibility   Escherichia coli - URINE CULTURE, REFLEX    AMOX/CLAVULANIC >=32 Resistant     AMPICILLIN >=32 Resistant     AMPICILLIN/SULBACTAM >=32 Resistant     CEFAZOLIN* 8 Resistant      * For uncomplicated UTI caused by E. coli, K. pneumoniae or P. mirabilis: Cefazolin is susceptible if MIC <32 mcg/mL and predicts susceptible to the oral agents cefaclor, cefdinir, cefpodoxime, cefprozil, cefuroxime, cephalexin and loracarbef.     CEFTAZIDIME <=1 Sensitive     CEFEPIME <=1 Sensitive     CEFTRIAXONE <=1  Sensitive     CIPROFLOXACIN <=0.25 Sensitive     LEVOFLOXACIN <=0.12 Sensitive     GENTAMICIN <=1 Sensitive     IMIPENEM <=0.25 Sensitive     NITROFURANTOIN <=16 Sensitive     PIP/TAZO <=4 Sensitive     TOBRAMYCIN <=1 Sensitive     TRIMETH/SULFA* <=20 Sensitive      * For uncomplicated UTI caused by E. coli, K. pneumoniae or P. mirabilis: Cefazolin is susceptible if MIC <32 mcg/mL and predicts susceptible to the oral agents cefaclor, cefdinir, cefpodoxime, cefprozil, cefuroxime, cephalexin and loracarbef. Legend: S = Susceptible  I = Intermediate R = Resistant  NS = Not susceptible * = Not tested  NR = Not reported **NN = See antimicrobic comments   MICROSCOPIC MESSAGE     Status: None   Collection Time: 06/25/22 10:50 AM  Result Value Ref Range Status   Note   Final    Comment: This urine was analyzed for the presence of WBC,  RBC, bacteria, casts, and other formed elements.  Only those elements seen were reported. . .   Resp Panel by RT-PCR (Flu A&B, Covid) Anterior Nasal Swab     Status: None   Collection Time: 07/01/22  3:04 PM   Specimen: Anterior Nasal Swab  Result Value Ref Range Status   SARS Coronavirus 2 by RT PCR NEGATIVE NEGATIVE Final    Comment: (NOTE) SARS-CoV-2 target nucleic acids are NOT DETECTED.  The SARS-CoV-2 RNA is generally detectable in upper respiratory specimens during the acute phase of infection. The lowest concentration of SARS-CoV-2 viral copies this assay can detect is 138 copies/mL. A negative result does not preclude SARS-Cov-2 infection and should not be used as the sole basis for treatment or other patient management decisions. A negative result may occur with  improper specimen collection/handling, submission of specimen other than nasopharyngeal swab, presence of viral mutation(s) within the areas targeted by this assay, and inadequate number of viral copies(<138 copies/mL). A negative result must be combined with clinical  observations, patient history, and epidemiological information. The expected result is Negative.  Fact Sheet for Patients:  EntrepreneurPulse.com.au  Fact Sheet for Healthcare Providers:  IncredibleEmployment.be  This test is no t yet approved or cleared by the [place] FDA and  has been authorized for detection and/or diagnosis of SARS-CoV-2 by FDA under an Emergency Use Authorization (EUA). This EUA will remain  in effect (meaning this test can be used) for the duration of the COVID-19 declaration under Section 564(b)(1) of the Act, 21 U.S.C.section 360bbb-3(b)(1), unless the authorization is terminated  or revoked sooner.       Influenza A by PCR NEGATIVE NEGATIVE Final   Influenza B by PCR NEGATIVE NEGATIVE Final    Comment: (NOTE) The Xpert Xpress SARS-CoV-2/FLU/RSV plus assay is intended as an aid in the diagnosis of influenza from Nasopharyngeal swab specimens  and should not be used as a sole basis for treatment. Nasal washings and aspirates are unacceptable for Xpert Xpress SARS-CoV-2/FLU/RSV testing.  Fact Sheet for Patients: EntrepreneurPulse.com.au  Fact Sheet for Healthcare Providers: IncredibleEmployment.be  This test is not yet approved or cleared by the [place] FDA and has been authorized for detection and/or diagnosis of SARS-CoV-2 by FDA under an Emergency Use Authorization (EUA). This EUA will remain in effect (meaning this test can be used) for the duration of the COVID-19 declaration under Section 564(b)(1) of the Act, 21 U.S.C. section 360bbb-3(b)(1), unless the authorization is terminated or revoked.  Performed at [facility], [place] [address], [place], [place] [address]   Blood Culture (routine x 2)     Status: None (Preliminary result)   Collection Time: 07/01/22  3:04 PM   Specimen: BLOOD  Result Value Ref Range Status   Specimen Description BLOOD SITE NOT  SPECIFIED  Final   Special Requests   Final    BOTTLES DRAWN AEROBIC AND ANAEROBIC Blood Culture adequate volume   Culture   Final    NO GROWTH 3 DAYS Performed at [facility], [address] [place], [place] [address]    Report Status PENDING  Incomplete  Urine Culture     Status: Abnormal (Preliminary result)   Collection Time: 07/01/22  3:04 PM   Specimen: In/Out Cath Urine  Result Value Ref Range Status   Specimen Description IN/OUT CATH URINE  Final   Special Requests   Final    NONE Performed at [facility], [place] [address], [place], [place] [address]    Culture 100 COLONIES/mL ENTEROCOCCUS FAECALIS (A)  Final   Report Status PENDING  Incomplete  Blood Culture (routine x 2)     Status: None (Preliminary result)   Collection Time: 07/01/22  3:09 PM   Specimen: BLOOD  Result Value Ref Range Status   Specimen Description BLOOD SITE NOT SPECIFIED  Final   Special Requests   Final    BOTTLES DRAWN AEROBIC AND ANAEROBIC Blood Culture adequate volume   Culture   Final    NO GROWTH 3 DAYS Performed at [facility], [place] [address], [place], [place] [address]    Report Status PENDING  Incomplete  Group A Strep by PCR     Status: None   Collection Time: 07/02/22 11:38 AM   Specimen: Throat; Sterile Swab  Result Value Ref Range Status   Group A Strep by PCR NOT DETECTED NOT DETECTED Final    Comment: Performed at [facility], [place] [address] [place], [place] [address]     Labs: BNP (last 3 results) No results for input(s): "BNP" in the last 8760 hours. Basic Metabolic Panel: Recent Labs  Lab 07/01/22 1433 07/01/22 1634 07/02/22 0023 07/03/22 0418  NA 135 135 135 135  K 4.1 3.7 4.2 3.6  CL 97*  --  99 97*  CO2 25  --  27 28  GLUCOSE 217*  --  245* 127*  BUN 15  --  15 13  CREATININE 1.02*  --  1.03* 1.00  CALCIUM 9.4  --  8.6* 8.8*  MG  --   --   --  1.1*   Liver Function Tests: Recent Labs  Lab 07/01/22 1433 07/02/22 0023  07/03/22 0418  AST 21 33 25  ALT _0 ALKPHOS 62 49 57  BILITOT 0.6 0.4 0.2*  PROT 6.4* 5.2* 4.9*  ALBUMIN  3.5 2.6* 2.3*   No results for input(s): "LIPASE", "AMYLASE" in the last 168 hours. No results for input(s): "AMMONIA" in the last 168 hours. CBC: Recent Labs  Lab 07/01/22 1433 07/01/22 1634 07/02/22 0023 07/03/22 0418  WBC 5.6  --  11.6* 7.0  NEUTROABS 5.1  --   --  5.3  HGB 12.3 11.6* 10.5* 11.0*  HCT 37.6 34.0* 31.8* 32.7*  MCV 92.2  --  91.9 90.6  PLT 357  --  347 293   Cardiac Enzymes: No results for input(s): "CKTOTAL", "CKMB", "CKMBINDEX", "TROPONINI" in the last 168 hours. BNP: Invalid input(s): "POCBNP" CBG: Recent Labs  Lab 07/02/22 2134 07/03/22 0611 07/03/22 1153 07/03/22 1605 07/04/22 0605  GLUCAP 114* 143* 153* 123* 170*   D-Dimer No results for input(s): "DDIMER" in the last 72 hours. Hgb A1c No results for input(s): "HGBA1C" in the last 72 hours. Lipid Profile No results for input(s): "CHOL", "HDL", "LDLCALC", "TRIG", "CHOLHDL", "LDLDIRECT" in the last 72 hours. Thyroid function studies No results for input(s): "TSH", "T4TOTAL", "T3FREE", "THYROIDAB" in the last 72 hours.  Invalid input(s): "FREET3" Anemia work up No results for input(s): "VITAMINB12", "FOLATE", "FERRITIN", "TIBC", "IRON", "RETICCTPCT" in the last 72 hours. Urinalysis    Component Value Date/Time   COLORURINE YELLOW 07/01/2022 1504   APPEARANCEUR CLEAR 07/01/2022 1504   LABSPEC 1.012 07/01/2022 1504   PHURINE 5.0 07/01/2022 1504   GLUCOSEU 50 (A) 07/01/2022 1504   HGBUR NEGATIVE 07/01/2022 1504   BILIRUBINUR NEGATIVE 07/01/2022 1504   KETONESUR 20 (A) 07/01/2022 1504   PROTEINUR 30 (A) 07/01/2022 1504   NITRITE NEGATIVE 07/01/2022 1504   LEUKOCYTESUR TRACE (A) 07/01/2022 1504   Sepsis Labs Recent Labs  Lab 07/01/22 1433 07/02/22 0023 07/03/22 0418  WBC 5.6 11.6* 7.0   Microbiology Recent Results (from the past 240 hour(s))  Urine Culture      Status: Abnormal   Collection Time: 06/25/22 10:50 AM   Specimen: Urine  Result Value Ref Range Status   MICRO NUMBER: [id number]  Final   SPECIMEN QUALITY: Adequate  Final   Sample Source URINE  Final   STATUS: FINAL  Final   ISOLATE 1: Escherichia coli (A)  Final    Comment: Greater than 100,000 CFU/mL of Escherichia coli      Susceptibility   Escherichia coli - URINE CULTURE, REFLEX    AMOX/CLAVULANIC >=32 Resistant     AMPICILLIN >=32 Resistant     AMPICILLIN/SULBACTAM >=32 Resistant     CEFAZOLIN* 8 Resistant      * For uncomplicated UTI caused by E. coli, K. pneumoniae or P. mirabilis: Cefazolin is susceptible if MIC <32 mcg/mL and predicts susceptible to the oral agents cefaclor, cefdinir, cefpodoxime, cefprozil, cefuroxime, cephalexin and loracarbef.     CEFTAZIDIME <=1 Sensitive     CEFEPIME <=1 Sensitive     CEFTRIAXONE <=1 Sensitive     CIPROFLOXACIN <=0.25 Sensitive     LEVOFLOXACIN <=0.12 Sensitive     GENTAMICIN <=1 Sensitive     IMIPENEM <=0.25 Sensitive     NITROFURANTOIN <=16 Sensitive     PIP/TAZO <=4 Sensitive     TOBRAMYCIN <=1 Sensitive     TRIMETH/SULFA* <=20 Sensitive      * For uncomplicated UTI caused by E. coli, K. pneumoniae or P. mirabilis: Cefazolin is susceptible if MIC <32 mcg/mL and predicts susceptible to the oral agents cefaclor, cefdinir, cefpodoxime, cefprozil, cefuroxime, cephalexin and loracarbef. Legend: S = Susceptible  I = Intermediate R = Resistant  NS =  Not susceptible * = Not tested  NR = Not reported **NN = See antimicrobic comments   MICROSCOPIC MESSAGE     Status: None   Collection Time: 06/25/22 10:50 AM  Result Value Ref Range Status   Note   Final    Comment: This urine was analyzed for the presence of WBC,  RBC, bacteria, casts, and other formed elements.  Only those elements seen were reported. . .   Resp Panel by RT-PCR (Flu A&B, Covid) Anterior Nasal Swab     Status: None   Collection Time: 07/01/22  3:04 PM    Specimen: Anterior Nasal Swab  Result Value Ref Range Status   SARS Coronavirus 2 by RT PCR NEGATIVE NEGATIVE Final    Comment: (NOTE) SARS-CoV-2 target nucleic acids are NOT DETECTED.  The SARS-CoV-2 RNA is generally detectable in upper respiratory specimens during the acute phase of infection. The lowest concentration of SARS-CoV-2 viral copies this assay can detect is 138 copies/mL. A negative result does not preclude SARS-Cov-2 infection and should not be used as the sole basis for treatment or other patient management decisions. A negative result may occur with  improper specimen collection/handling, submission of specimen other than nasopharyngeal swab, presence of viral mutation(s) within the areas targeted by this assay, and inadequate number of viral copies(<138 copies/mL). A negative result must be combined with clinical observations, patient history, and epidemiological information. The expected result is Negative.  Fact Sheet for Patients:  EntrepreneurPulse.com.au  Fact Sheet for Healthcare Providers:  IncredibleEmployment.be  This test is no t yet approved or cleared by the [place] FDA and  has been authorized for detection and/or diagnosis of SARS-CoV-2 by FDA under an Emergency Use Authorization (EUA). This EUA will remain  in effect (meaning this test can be used) for the duration of the COVID-19 declaration under Section 564(b)(1) of the Act, 21 U.S.C.section 360bbb-3(b)(1), unless the authorization is terminated  or revoked sooner.       Influenza A by PCR NEGATIVE NEGATIVE Final   Influenza B by PCR NEGATIVE NEGATIVE Final    Comment: (NOTE) The Xpert Xpress SARS-CoV-2/FLU/RSV plus assay is intended as an aid in the diagnosis of influenza from Nasopharyngeal swab specimens and should not be used as a sole basis for treatment. Nasal washings and aspirates are unacceptable for Xpert Xpress  SARS-CoV-2/FLU/RSV testing.  Fact Sheet for Patients: EntrepreneurPulse.com.au  Fact Sheet for Healthcare Providers: IncredibleEmployment.be  This test is not yet approved or cleared by the [place] FDA and has been authorized for detection and/or diagnosis of SARS-CoV-2 by FDA under an Emergency Use Authorization (EUA). This EUA will remain in effect (meaning this test can be used) for the duration of the COVID-19 declaration under Section 564(b)(1) of the Act, 21 U.S.C. section 360bbb-3(b)(1), unless the authorization is terminated or revoked.  Performed at [facility], [place] [address], [place], [place] [address]   Blood Culture (routine x 2)     Status: None (Preliminary result)   Collection Time: 07/01/22  3:04 PM   Specimen: BLOOD  Result Value Ref Range Status   Specimen Description BLOOD SITE NOT SPECIFIED  Final   Special Requests   Final    BOTTLES DRAWN AEROBIC AND ANAEROBIC Blood Culture adequate volume   Culture   Final    NO GROWTH 3 DAYS Performed at [facility], [address], [place], [place] [address]    Report Status PENDING  Incomplete  Urine Culture  Status: Abnormal (Preliminary result)   Collection Time: 07/01/22  3:04 PM   Specimen: In/Out Cath Urine  Result Value Ref Range Status   Specimen Description IN/OUT CATH URINE  Final   Special Requests   Final    NONE Performed at [facility], [place] [address] [place], [place] [address]    Culture 100 COLONIES/mL ENTEROCOCCUS FAECALIS (A)  Final   Report Status PENDING  Incomplete  Blood Culture (routine x 2)     Status: None (Preliminary result)   Collection Time: 07/01/22  3:09 PM   Specimen: BLOOD  Result Value Ref Range Status   Specimen Description BLOOD SITE NOT SPECIFIED  Final   Special Requests   Final    BOTTLES DRAWN AEROBIC AND ANAEROBIC Blood Culture adequate volume   Culture   Final    NO GROWTH 3 DAYS Performed at [facility], [place] [address] [place], [place] [address]    Report Status PENDING  Incomplete  Group A Strep by PCR     Status: None   Collection Time: 07/02/22 11:38 AM   Specimen: Throat; Sterile Swab  Result Value Ref Range Status   Group A Strep by PCR NOT DETECTED NOT DETECTED Final    Comment: Performed at [facility], [place] [address] [place], [place] [address]     Time coordinating discharge: 35 minutes  SIGNED:   Aline August, MD  [facility] 07/04/2022, 1:03 PM

## 2022-07-04 NOTE — TOC Transition Note (Signed)
Transition of Care [facility]) - CM/SW Discharge Note   Patient Details  Name: Grace [name] MRN: [id number] Date of Birth: 1951/05/18  Transition of Care [facility]) CM/SW Contact:  Pollie Friar, RN Phone Number: 07/04/2022, 9:55 AM   Clinical Narrative:    Pt discharging home with self care. Pt refused SNF rehab and [facility] services.  Pts spouse to provide transport home.   Final next level of care: Home/Self Care Barriers to Discharge: No Barriers Identified   Patient Goals and CMS Choice     Choice offered to / list presented to : Patient  Discharge Placement                       Discharge Plan and Services   Discharge Planning Services: CM Consult                                 Social Determinants of Health (SDOH) Interventions     Readmission Risk Interventions    03/27/2022   11:59 AM  Readmission Risk Prevention Plan  Post Dischage Appt Complete  Medication Screening Complete  Transportation Screening Complete

## 2022-07-05 LAB — URINE CULTURE: Culture: 100 — AB

## 2022-07-06 LAB — CULTURE, BLOOD (ROUTINE X 2)
Culture: NO GROWTH
Culture: NO GROWTH
Special Requests: ADEQUATE
Special Requests: ADEQUATE

## 2022-07-08 NOTE — Patient Instructions (Addendum)
 -   Recommend return for Nurse Visit in 2 weeks  ( Dec 13  at 10 am)   to recheck Urine & lab  - Recommend decrease Zinc 50 mg to 1/2 tablet = 25 mg /day   - Recommend stop Penlac  (Cicrolax)    - Very important to drink adequate amounts of fluids to prevent permanent damage   - Recommend drink at least 6 bottles (16 ounces) of fluids /water /day = 96 Oz ~100 oz  - 100 oz = 3,000 cc or 3 liters / day  - >> That's 1 &1/2 bottles of a 2 liter soda bottle /day !

## 2022-07-08 NOTE — Progress Notes (Unsigned)
Future Appointments  Date Time Provider Department  07/09/2022  9:30 AM Unk Pinto, MD [facility]  07/30/2022 10:00 AM [facility] NURSE [facility]  09/17/2022  9:30 AM Alycia Rossetti, NP [facility]  09/30/2022 11:15 AM Trula Slade, DPM [facility]  01/22/2023  8:45 AM Suzzanne Cloud, NP [facility]  06/12/2023 10:00 AM Alycia Rossetti, NP [facility] Follow-Up     This very nice 71 y.o.female was admitted to the hospital on   07/01/2022  and patient was discharged from the hospital on  07/04/2022   . The patient now presents 5 days post hospital  for follow up for transition from recent hospitalization .  The day after discharge  our clinical staff contacted the patient to assure stability and schedule a follow up appointment. The discharge summary, medications and diagnostic test results were reviewed before meeting with the patient. The patient was admitted for:   Acute metabolic encephalopathy Acute cystitis without hematuria Lactic acidosis Physical deconditioning Hypomagnesemia CKD (chronic kidney disease) stage 2, GFR 60-89 ml/min OSA (obstructive sleep apnea)      The patient is a very nice 71 yo MWF with T2_NIDDM /neuropathy, hyperlipidemia, OSA(off CPAP),  CKD 2, fatty liver who was admitted with UTI / Sepsis culturing (+) positive for E.coli treated initially with IVF for rehydration & IV Rocephin  and discharged on Duracef ( 10 days til 11/27) .       Hospitalization discharge instructions and medications are reconciled with the patient.      Patient is also followed with Hypertension, Hyperlipidemia, Pre-Diabetes and Vitamin D Deficiency.      Patient is treated for HTN & BP has been controlled at home. Today's  . Patient has had no complaints of any cardiac type chest pain, palpitations, dyspnea/orthopnea/PND, dizziness, claudication, or dependent edema.     Hyperlipidemia is controlled with diet & meds. Patient denies myalgias or other med  SE's. Last Lipids were  Lab Results  Component Value Date   CHOL 192 06/11/2022   HDL 60 06/11/2022   LDLCALC 98 06/11/2022   TRIG 215 (H) 06/11/2022   CHOLHDL 3.2 06/11/2022      Also, the patient has history of T2_NIDDM PreDiabetes and has had no symptoms of reactive hypoglycemia, diabetic polys, paresthesias or visual blurring.  Last A1c was  Lab Results  Component Value Date   HGBA1C 7.1 (H) 06/11/2022      Further, the patient also has history of Vitamin D Deficiency and supplements vitamin D without any suspected side-effects. Last vitamin D was   Lab Results  Component Value Date   VD25OH 76 06/11/2022   Current Outpatient Medications on File Prior to Visit  Medication Sig   acetaminophen (TYLENOL) 500 MG tablet Take 1,000 mg by mouth every 8 (eight) hours as needed for moderate pain.   cefadroxil (DURICEF) 500 MG capsule Take 2 capsules (1,000 mg total) by mouth 2 (two) times daily for 5 days.   Cholecalciferol (DIALYVITE VITAMIN D 5000) 125 MCG (5000 UT) capsule Take 5,000 Units by mouth daily.   DULoxetine (CYMBALTA) 30 MG capsule Take 1 capsule (30 mg total) by mouth daily.   Magnesium 250 MG TABS Take 500 mg by mouth daily.   metFORMIN (GLUCOPHAGE-XR) 500 MG 24 hr tablet Take 2 tablets (1,000 mg total) by mouth 2 (two) times daily.   Misc Natural Products (YUMVS BEET ROOT-TART CHERRY PO) Take 1 tablet by mouth daily.   zinc  50 MG  tablet Take 50 mg by mouth daily.    Allergies  Allergen Reactions   Gabapentin Swelling    Swelling in legs and feet   PMHx:   Past Medical History:  Diagnosis Date   Abnormality of gait 07/28/2013   Chronic kidney disease    Diabetes ([place])    Diabetic peripheral neuropathy ([place])    Diabetic retinopathy ([place])    Difficult intubation    Foot drop, bilateral 07/31/2014   GERD (gastroesophageal reflux disease)    Peripheral edema    Polyneuropathy in diabetes(357.2) 07/28/2013   Sleep apnea    uses cpap   Tongue mass     Immunization History  Administered Date(s) Administered   Influenza, High Dose Seasonal PF 05/12/2016, 07/03/2017, 05/24/2018, 06/10/2019, 05/02/2021, 06/11/2022   Influenza-Unspecified 07/03/2017   PFIZER(Purple Top)SARS-COV-2 Vaccination 09/22/2019, 10/13/2019, 06/15/2020   Pneumococcal Conjugate-13 02/07/2016   Pneumococcal Polysaccharide-23 09/03/2017   Td 02/07/2016   Zoster Recombinat (Shingrix) 10/11/2021, 01/06/2022   Past Surgical History:  Procedure Laterality Date   ABDOMINAL HYSTERECTOMY     ANKLE FRACTURE SURGERY     left   CATARACT EXTRACTION W/PHACO Right 02/21/2014   Dr. Herbert Deaner   CATARACT EXTRACTION W/PHACO Left 04/10/2014   Dr. Herbert Deaner   CATARACT EXTRACTION, BILATERAL     DIRECT LARYNGOSCOPY Left 08/02/2021   Procedure: DIRECT LARYNGOSCOPY WITH TONGUE BIOPSY;  Surgeon: Leta Baptist, MD;  Location: [place];  Service: ENT;  Laterality: Left;   HIP SURGERY     right   MASS BIOPSY Left 09/06/2021   Procedure: NECK MASS BIOPSY;  Surgeon: Leta Baptist, MD;  Location: [place];  Service: ENT;  Laterality: Left;   TONSILLECTOMY     FHx:    Reviewed / unchanged  SHx:    Reviewed / unchanged  Systems Review:  Constitutional: Denies fever, chills, wt changes, headaches, insomnia, fatigue, night sweats, change in appetite. Eyes: Denies redness, blurred vision, diplopia, discharge, itchy, watery eyes.  ENT: Denies discharge, congestion, post nasal drip, epistaxis, sore throat, earache, hearing loss, dental pain, tinnitus, vertigo, sinus pain, snoring.  CV: Denies chest pain, palpitations, irregular heartbeat, syncope, dyspnea, diaphoresis, orthopnea, PND, claudication or edema. Respiratory: denies cough, dyspnea, DOE, pleurisy, hoarseness, laryngitis, wheezing.  Gastrointestinal: Denies dysphagia, odynophagia, heartburn, reflux, water brash, abdominal pain or cramps, nausea, vomiting, bloating, diarrhea, constipation, hematemesis, melena,  hematochezia  or hemorrhoids. Genitourinary: Denies dysuria, frequency, urgency, nocturia, hesitancy, discharge, hematuria or flank pain. Musculoskeletal: Denies arthralgias, myalgias, stiffness, jt. swelling, pain, limping or strain/sprain.  Skin: Denies pruritus, rash, hives, warts, acne, eczema or change in skin lesion(s). Neuro: No weakness, tremor, incoordination, spasms, paresthesia or pain. Psychiatric: Denies confusion, memory loss or sensory loss. Endo: Denies change in weight, skin or hair change.  Heme/Lymph: No excessive bleeding, bruising or enlarged lymph nodes.  Physical Exam  There were no vitals taken for this visit.  Appears well nourished, well groomed  and in no distress.  Eyes: PERRLA, EOMs, conjunctiva no swelling or erythema. Sinuses: No frontal/maxillary tenderness ENT/Mouth: EAC's clear, TM's nl w/o erythema, bulging. Nares clear w/o erythema, swelling, exudates. Oropharynx clear without erythema or exudates. Oral hygiene is good. Tongue normal, non obstructing. Hearing intact.  Neck: Supple. Thyroid nl. Car 2+/2+ without bruits, nodes or JVD. Chest: Respirations nl with BS clear & equal w/o rales, rhonchi, wheezing or stridor.  Cor: Heart sounds normal w/ regular rate and rhythm without sig. murmurs, gallops, clicks or rubs. Peripheral pulses normal and equal  without edema.  Abdomen: Soft & bowel sounds normal. Non-tender w/o guarding, rebound, hernias, masses or organomegaly.  Lymphatics: Unremarkable.  Musculoskeletal: Full ROM all peripheral extremities, joint stability, 5/5 strength and normal gait.  Skin: Warm, dry without exposed rashes, lesions or ecchymosis apparent.  Neuro: Cranial nerves intact, reflexes equal bilaterally. Sensory-motor testing grossly intact. Tendon reflexes grossly intact.  Pysch: Alert & oriented x 3.  Insight and judgement nl & appropriate. No ideations.  Assessment and Plan:  - Continue medication, monitor blood pressure at  home.  - Continue DASH diet.  Reminder to go to the ER if any CP,  SOB, nausea, dizziness, severe HA, changes vision/speech.   - Continue diet/meds, exercise,& lifestyle modifications.  - Continue monitor periodic cholesterol/liver & renal functions   - Continue diet, exercise, lifestyle modifications.  - Monitor appropriate labs. - Continue supplementation.      Discussed  regular exercise, BP monitoring, weight control to achieve/maintain BMI less than 25 and discussed meds and SE's. Recommended labs to assess and monitor clinical status with further disposition pending results of labs. Over 30 minutes of exam, counseling, chart review was performed.   Kirtland Bouchard, MD

## 2022-07-09 ENCOUNTER — Ambulatory Visit (INDEPENDENT_AMBULATORY_CARE_PROVIDER_SITE_OTHER): Payer: Medicare Other | Admitting: Internal Medicine

## 2022-07-09 ENCOUNTER — Encounter: Payer: Self-pay | Admitting: Internal Medicine

## 2022-07-09 VITALS — BP 170/80 | HR 74 | Temp 97.9°F | Resp 17 | Ht 64.0 in | Wt 154.0 lb

## 2022-07-09 DIAGNOSIS — R5381 Other malaise: Secondary | ICD-10-CM

## 2022-07-09 DIAGNOSIS — G4733 Obstructive sleep apnea (adult) (pediatric): Secondary | ICD-10-CM

## 2022-07-09 DIAGNOSIS — E872 Acidosis, unspecified: Secondary | ICD-10-CM

## 2022-07-09 DIAGNOSIS — I10 Essential (primary) hypertension: Secondary | ICD-10-CM

## 2022-07-09 DIAGNOSIS — E1122 Type 2 diabetes mellitus with diabetic chronic kidney disease: Secondary | ICD-10-CM

## 2022-07-09 DIAGNOSIS — G9341 Metabolic encephalopathy: Secondary | ICD-10-CM | POA: Diagnosis not present

## 2022-07-09 DIAGNOSIS — N182 Chronic kidney disease, stage 2 (mild): Secondary | ICD-10-CM | POA: Diagnosis not present

## 2022-07-09 DIAGNOSIS — N3000 Acute cystitis without hematuria: Secondary | ICD-10-CM | POA: Diagnosis not present

## 2022-07-29 ENCOUNTER — Encounter: Payer: Self-pay | Admitting: Internal Medicine

## 2022-07-30 ENCOUNTER — Ambulatory Visit: Payer: Medicare Other

## 2022-07-30 DIAGNOSIS — N3000 Acute cystitis without hematuria: Secondary | ICD-10-CM

## 2022-07-30 DIAGNOSIS — N182 Chronic kidney disease, stage 2 (mild): Secondary | ICD-10-CM | POA: Diagnosis not present

## 2022-07-30 DIAGNOSIS — E872 Acidosis, unspecified: Secondary | ICD-10-CM | POA: Diagnosis not present

## 2022-07-30 DIAGNOSIS — G9341 Metabolic encephalopathy: Secondary | ICD-10-CM | POA: Diagnosis not present

## 2022-07-30 DIAGNOSIS — I10 Essential (primary) hypertension: Secondary | ICD-10-CM | POA: Diagnosis not present

## 2022-07-30 DIAGNOSIS — E1122 Type 2 diabetes mellitus with diabetic chronic kidney disease: Secondary | ICD-10-CM | POA: Diagnosis not present

## 2022-07-30 NOTE — Progress Notes (Signed)
The patient came in for repeat UA/Culture and blood work. She reports no new issues or concerns today.

## 2022-07-31 NOTE — Progress Notes (Signed)
<><><><><><><><><><><><><><><><><><><><><><><><><><><><><><><><><> <><><><><><><><><><><><><><><><><><><><><><><><><><><><><><><><><> -   Test results slightly outside the reference range are not unusual. If there is anything important, I will review this with you,  otherwise it is considered normal test values.  If you have further questions,  please do not hesitate to contact me at the office or via My Chart.  <><><><><><><><><><><><><><><><><><><><><><><><><><><><><><><><><> <><><><><><><><><><><><><><><><><><><><><><><><><><><><><><><><><>  -  Blood glucose = 216  is too high <><><><><><><><><><><><><><><><><><><><><><><><><><><><><><><><><>  -   Magnesium  = 1.5 is   very  low          - goal is betw 2.0 - 2.5,   - So..............Marland Kitchen  Recommend that you  INCREASE                                                                        Magnesium 500 mg t to 2 tablets 2 x /day   - also important to eat lots of  leafy green vegetables   - spinach - Kale - collards - greens - okra - asparagus  - broccoli - quinoa - squash - almonds   - black, red, white beans-  peas - green beans <><><><><><><><><><><><><><><><><><><><><><><><><><><><><><><><><>  -  Urine culture still pending   <><><><><><><><><><><><><><><><><><><><><><><><><><><><><><><><><>

## 2022-08-02 ENCOUNTER — Other Ambulatory Visit: Payer: Self-pay | Admitting: Internal Medicine

## 2022-08-02 DIAGNOSIS — N3000 Acute cystitis without hematuria: Secondary | ICD-10-CM

## 2022-08-02 LAB — URINALYSIS, ROUTINE W REFLEX MICROSCOPIC
Bacteria, UA: NONE SEEN /HPF
Bilirubin Urine: NEGATIVE
Glucose, UA: NEGATIVE
Hgb urine dipstick: NEGATIVE
Hyaline Cast: NONE SEEN /LPF
Ketones, ur: NEGATIVE
Nitrite: NEGATIVE
Protein, ur: NEGATIVE
RBC / HPF: NONE SEEN /HPF (ref 0–2)
Specific Gravity, Urine: 1.009 (ref 1.001–1.035)
WBC, UA: NONE SEEN /HPF (ref 0–5)
pH: 7.5 (ref 5.0–8.0)

## 2022-08-02 LAB — COMPLETE METABOLIC PANEL WITH GFR
AG Ratio: 1.6 (calc) (ref 1.0–2.5)
ALT: 10 U/L (ref 6–29)
AST: 11 U/L (ref 10–35)
Albumin: 4 g/dL (ref 3.6–5.1)
Alkaline phosphatase (APISO): 66 U/L (ref 37–153)
BUN: 19 mg/dL (ref 7–25)
CO2: 32 mmol/L (ref 20–32)
Calcium: 10.6 mg/dL — ABNORMAL HIGH (ref 8.6–10.4)
Chloride: 98 mmol/L (ref 98–110)
Creat: 0.91 mg/dL (ref 0.60–1.00)
Globulin: 2.5 g/dL (calc) (ref 1.9–3.7)
Glucose, Bld: 216 mg/dL — ABNORMAL HIGH (ref 65–99)
Potassium: 5.1 mmol/L (ref 3.5–5.3)
Sodium: 140 mmol/L (ref 135–146)
Total Bilirubin: 0.4 mg/dL (ref 0.2–1.2)
Total Protein: 6.5 g/dL (ref 6.1–8.1)
eGFR: 67 mL/min/{1.73_m2} (ref >=60)

## 2022-08-02 LAB — CBC WITH DIFFERENTIAL/PLATELET
Absolute Monocytes: 648 cells/uL (ref 200–950)
Basophils Absolute: 90 cells/uL (ref 0–200)
Basophils Relative: 1.1 %
Eosinophils Absolute: 320 cells/uL (ref 15–500)
Eosinophils Relative: 3.9 %
HCT: 38.9 % (ref 35.0–45.0)
Hemoglobin: 12.4 g/dL (ref 11.7–15.5)
Lymphs Abs: 2042 cells/uL (ref 850–3900)
MCH: 29.7 pg (ref 27.0–33.0)
MCHC: 31.9 g/dL — ABNORMAL LOW (ref 32.0–36.0)
MCV: 93.1 fL (ref 80.0–100.0)
MPV: 9.7 fL (ref 7.5–12.5)
Monocytes Relative: 7.9 %
Neutro Abs: 5100 cells/uL (ref 1500–7800)
Neutrophils Relative %: 62.2 %
Platelets: 492 10*3/uL — ABNORMAL HIGH (ref 140–400)
RBC: 4.18 10*6/uL (ref 3.80–5.10)
RDW: 12.9 % (ref 11.0–15.0)
Total Lymphocyte: 24.9 %
WBC: 8.2 10*3/uL (ref 3.8–10.8)

## 2022-08-02 LAB — URINE CULTURE
MICRO NUMBER:: 14310757
SPECIMEN QUALITY:: ADEQUATE

## 2022-08-02 LAB — MICROSCOPIC MESSAGE

## 2022-08-02 LAB — MAGNESIUM: Magnesium: 1.5 mg/dL (ref 1.5–2.5)

## 2022-08-02 MED ORDER — NITROFURANTOIN MONOHYD MACRO 100 MG PO CAPS: ORAL_CAPSULE | ORAL | 0 refills | Status: DC

## 2022-08-02 NOTE — Progress Notes (Signed)
<><><><><><><><><><><><><><><><><><><><><><><><><><><><><><><><><> <><><><><><><><><><><><><><><><><><><><><><><><><><><><><><><><><>  -   Urine culture finally returned and does so Infection ( UTI),   So [facility]   - Please call office to schedule a Nurse Visit in about 1 month to recheck urine to   assure infection is cleared up.   <><><><><><><><><><><><><><><><><><><><><><><><><><><><><><><><><> <><><><><><><><><><><><><><><><><><><><><><><><><><><><><><><><><>

## 2022-08-05 ENCOUNTER — Ambulatory Visit: Payer: Medicare Other | Admitting: Nurse Practitioner

## 2022-08-07 DIAGNOSIS — N3020 Other chronic cystitis without hematuria: Secondary | ICD-10-CM | POA: Diagnosis not present

## 2022-08-07 DIAGNOSIS — N8111 Cystocele, midline: Secondary | ICD-10-CM | POA: Diagnosis not present

## 2022-08-07 DIAGNOSIS — N3942 Incontinence without sensory awareness: Secondary | ICD-10-CM | POA: Diagnosis not present

## 2022-08-07 DIAGNOSIS — N952 Postmenopausal atrophic vaginitis: Secondary | ICD-10-CM | POA: Diagnosis not present

## 2022-08-14 DIAGNOSIS — E113412 Type 2 diabetes mellitus with severe nonproliferative diabetic retinopathy with macular edema, left eye: Secondary | ICD-10-CM | POA: Diagnosis not present

## 2022-08-14 DIAGNOSIS — G4733 Obstructive sleep apnea (adult) (pediatric): Secondary | ICD-10-CM | POA: Diagnosis not present

## 2022-08-14 DIAGNOSIS — E113411 Type 2 diabetes mellitus with severe nonproliferative diabetic retinopathy with macular edema, right eye: Secondary | ICD-10-CM | POA: Diagnosis not present

## 2022-08-14 DIAGNOSIS — H35373 Puckering of macula, bilateral: Secondary | ICD-10-CM | POA: Diagnosis not present

## 2022-08-14 LAB — HM DIABETES EYE EXAM

## 2022-08-19 ENCOUNTER — Other Ambulatory Visit (HOSPITAL_COMMUNITY): Payer: Self-pay | Admitting: Otolaryngology

## 2022-08-19 ENCOUNTER — Ambulatory Visit (HOSPITAL_COMMUNITY)
Admission: RE | Admit: 2022-08-19 | Discharge: 2022-08-19 | Disposition: A | Discharge status: Home / Self Care | Payer: Medicare Other | Source: Ambulatory Visit | Attending: Podiatry | Admitting: Podiatry

## 2022-08-19 DIAGNOSIS — I739 Peripheral vascular disease, unspecified: Secondary | ICD-10-CM | POA: Diagnosis not present

## 2022-08-19 DIAGNOSIS — R221 Localized swelling, mass and lump, neck: Secondary | ICD-10-CM

## 2022-08-21 DIAGNOSIS — G4733 Obstructive sleep apnea (adult) (pediatric): Secondary | ICD-10-CM | POA: Diagnosis not present

## 2022-08-21 DIAGNOSIS — E113411 Type 2 diabetes mellitus with severe nonproliferative diabetic retinopathy with macular edema, right eye: Secondary | ICD-10-CM | POA: Diagnosis not present

## 2022-08-21 DIAGNOSIS — H35371 Puckering of macula, right eye: Secondary | ICD-10-CM | POA: Diagnosis not present

## 2022-08-21 DIAGNOSIS — E113412 Type 2 diabetes mellitus with severe nonproliferative diabetic retinopathy with macular edema, left eye: Secondary | ICD-10-CM | POA: Diagnosis not present

## 2022-09-02 ENCOUNTER — Ambulatory Visit (HOSPITAL_COMMUNITY)
Admission: RE | Admit: 2022-09-02 | Discharge: 2022-09-02 | Disposition: A | Discharge status: Home / Self Care | Payer: Medicare Other | Source: Ambulatory Visit | Attending: Otolaryngology | Admitting: Otolaryngology

## 2022-09-02 ENCOUNTER — Ambulatory Visit (INDEPENDENT_AMBULATORY_CARE_PROVIDER_SITE_OTHER): Payer: Medicare Other

## 2022-09-02 VITALS — BP 138/72 | HR 73 | Temp 96.8°F | Ht 64.0 in | Wt 146.8 lb

## 2022-09-02 DIAGNOSIS — R221 Localized swelling, mass and lump, neck: Secondary | ICD-10-CM | POA: Insufficient documentation

## 2022-09-02 DIAGNOSIS — N3000 Acute cystitis without hematuria: Secondary | ICD-10-CM | POA: Diagnosis not present

## 2022-09-02 MED ORDER — IOHEXOL 350 MG/ML SOLN
60.0000 mL | Freq: Once | INTRAVENOUS | Status: AC | PRN
  Administered 2022-09-02: 60 mL via INTRAVENOUS

## 2022-09-02 NOTE — Progress Notes (Signed)
Patient presents to the office for a nurse visit to have a urine rechecked. Had completed macrobid, but states that she is having some itchiness occurring. Blood pressure 138/72, pulse 73, temperature (!) 96.8 F (36 C), height '5\' 4"'$  (1.626 m), weight 146 lb 12.8 oz (66.6 kg), SpO2 99 %.

## 2022-09-03 LAB — URINALYSIS, ROUTINE W REFLEX MICROSCOPIC
Bilirubin Urine: NEGATIVE
Glucose, UA: NEGATIVE
Hgb urine dipstick: NEGATIVE
Ketones, ur: NEGATIVE
Leukocytes,Ua: NEGATIVE
Nitrite: NEGATIVE
Protein, ur: NEGATIVE
Specific Gravity, Urine: 1.005 (ref 1.001–1.035)
pH: 7.5 (ref 5.0–8.0)

## 2022-09-03 LAB — URINE CULTURE
MICRO NUMBER:: 14435381
SPECIMEN QUALITY:: ADEQUATE

## 2022-09-03 NOTE — Progress Notes (Signed)
<><><><><><><><><><><><><><><><><><><><><><><><><><><><><><><><><> <><><><><><><><><><><><><><><><><><><><><><><><><><><><><><><><><>  -   U/C - Negative - OK - No Infection & No Antibiotic needed   <><><><><><><><><><><><><><><><><><><><><><><><><><><><><><><><><> <><><><><><><><><><><><><><><><><><><><><><><><><><><><><><><><><>

## 2022-09-09 ENCOUNTER — Encounter: Payer: Self-pay | Admitting: Internal Medicine

## 2022-09-15 DIAGNOSIS — D487 Neoplasm of uncertain behavior of other specified sites: Secondary | ICD-10-CM | POA: Diagnosis not present

## 2022-09-16 DIAGNOSIS — N3942 Incontinence without sensory awareness: Secondary | ICD-10-CM | POA: Diagnosis not present

## 2022-09-16 DIAGNOSIS — N8111 Cystocele, midline: Secondary | ICD-10-CM | POA: Diagnosis not present

## 2022-09-17 ENCOUNTER — Ambulatory Visit: Payer: Medicare Other | Admitting: Nurse Practitioner

## 2022-09-19 ENCOUNTER — Other Ambulatory Visit: Payer: Self-pay | Admitting: Nurse Practitioner

## 2022-09-29 ENCOUNTER — Telehealth: Payer: Self-pay | Admitting: Internal Medicine

## 2022-09-29 DIAGNOSIS — N3942 Incontinence without sensory awareness: Secondary | ICD-10-CM | POA: Diagnosis not present

## 2022-09-29 NOTE — Progress Notes (Signed)
  Chronic Care Management   Outreach Note  09/29/2022 Name: KIMBERLA DRISKILL MRN: [id number] DOB: 1951-06-09  Referred by: Unk Pinto, MD Reason for referral : No chief complaint on file.   An unsuccessful telephone outreach was attempted today. The patient was referred to the pharmacist for assistance with care management and care coordination.   Follow Up Plan:   Tatjana Dellinger Upstream Scheduler

## 2022-09-30 ENCOUNTER — Ambulatory Visit (INDEPENDENT_AMBULATORY_CARE_PROVIDER_SITE_OTHER): Payer: Medicare Other | Admitting: Podiatry

## 2022-09-30 ENCOUNTER — Encounter: Payer: Self-pay | Admitting: Podiatry

## 2022-09-30 VITALS — BP 160/51 | HR 73

## 2022-09-30 DIAGNOSIS — M79674 Pain in right toe(s): Secondary | ICD-10-CM

## 2022-09-30 DIAGNOSIS — I739 Peripheral vascular disease, unspecified: Secondary | ICD-10-CM | POA: Diagnosis not present

## 2022-09-30 DIAGNOSIS — M79675 Pain in left toe(s): Secondary | ICD-10-CM

## 2022-09-30 DIAGNOSIS — B351 Tinea unguium: Secondary | ICD-10-CM | POA: Diagnosis not present

## 2022-09-30 NOTE — Progress Notes (Deleted)
MEDICARE ANNUAL WELLNESS VISIT AND FOLLOW UP  Assessment:   Encounter for Medicare annual wellness exam 1 year  Diabetic polyneuropathy associated with diabetes mellitus due to underlying condition ([place]) Continue follow up  Type 2 diabetes mellitus with stage 2 chronic kidney disease, without long-term current use of insulin (HCC) Increase fluids, avoid NSAIDS, monitor sugars, will monitor Discussed trulicity to help lower A1c , pt will consider- is not ready to start now - A1c  Type 2 diabetes mellitus with hyperlipidemia ([place]) STRONGLY suggest getting on low dose statin, patient declines at this time, will consider.  decrease fatty foods increase activity.  - lipid panel  Current moderate episode of depression(HCC) Suggested therapy and possible adjustment of medication, currently on Cymbalta for pain- pt refuses at this time.  Will try to increase exercise Instructed patient to contact office or on-call physician promptly should condition worsen or any new symptoms appear. IF THE PATIENT HAS ANY SUICIDAL OR HOMICIDAL IDEATIONS, CALL THE OFFICE, DISCUSS WITH A SUPPORT MEMBER, OR GO TO THE ER IMMEDIATELY. Patient was agreeable with this plan.   Diabetes mellitus type 2 with peripheral artery disease ([place]) - suggest repeat of ABI, patient declines at this time.   Diabetes mellitus with macular edema ([place]) - continue follow up every 6 weeks.   Medication management Continued  Mixed hyperlipidemia Associated with Type 2 Diabetes Mellitus(HCC) -     Continue diet and exercise  Essential hypertension - continue medications, DASH diet, exercise and monitor at home. Call if greater than 130/80. - CBC, CMP  Foot drop, bilateral Has leg braces and follows with ortho  Vitamin D deficiency Continue Vit D supplementation   Fatty liver Check labs, avoid tylenol, alcohol, weight loss advised.   OSA on CPAP Continue  Hypercalcemia - CMP - PTH  Over 30 minutes of exam,  counseling, chart review, and critical decision making was performed  Future Appointments  Date Time Provider [place]  10/01/2022 11:30 AM Alycia Rossetti, NP [facility] None  12/29/2022  9:45 AM Trula Slade, DPM [facility]  01/22/2023  8:45 AM Suzzanne Cloud, NP [facility] None  06/12/2023 10:00 AM Alycia Rossetti, NP [facility] None  10/02/2023 11:00 AM Alycia Rossetti, NP [facility] None    Plan:   During the course of the visit the patient was educated and counseled about appropriate screening and preventive services including:   Pneumococcal vaccine  Influenza vaccine Td vaccine Prevnar 13 Screening electrocardiogram Screening mammography Bone densitometry screening Colorectal cancer screening Diabetes screening Glaucoma screening Nutrition counseling  Advanced directives: given info/requested copies   Subjective:   Grace [name] is a 72 y.o. female who presents for Medicare Annual Wellness Visit and 3 month follow up on hypertension, diabetes, hyperlipidemia, vitamin D def.   U/S of cervical lymph node in 1-2 weeks is scheduled She is interested in COVID vaccine when it becomes available.   BMI is There is no height or weight on file to calculate BMI., she is working on diet and exercise. Wt Readings from Last 3 Encounters:  09/02/22 146 lb 12.8 oz (66.6 kg)  07/30/22 148 lb (67.1 kg)  07/09/22 154 lb (69.9 kg)    Her blood pressure is not checked at home, today their BP is   She does not workout. She denies chest pain, shortness of breath, dizziness.   She does have a history of diabetes managed with medication, diet, and exercise.  With PAD had ABI in 2015 that showed mild disease,  not on ASA due to Mac Degen She has CKD She has neuropathy which is extensive and requires her to use a walker to avoid falls, she is on cymbalta for neuropathy. She has braces on her legs to help prevent falls, no falls in past year but she is high  risk.  With hyperlipidemia not at goal- long discussion about statins, will check today and see where we are at She has macular degeneration and edema following with Dr. [name], not on HCTZ due to this She has not checked her blood sugars Has accucheck she is on 4 metformin a day   1 amaryl a day with largest meal no low blood sugar.    Lab Results  Component Value Date   HGBA1C 7.1 (H) 06/11/2022   Last GFR Lab Results  Component Value Date   GFRNONAA 60 (L) 07/03/2022   Lab Results  Component Value Date   CHOL 192 06/11/2022   HDL 60 06/11/2022   LDLCALC 98 06/11/2022   TRIG 215 (H) 06/11/2022   CHOLHDL 3.2 06/11/2022    Patient is on Vitamin D supplement. Lab Results  Component Value Date   VD25OH 76 06/11/2022      Medication Review  Current Outpatient Medications (Endocrine & Metabolic):    metFORMIN (GLUCOPHAGE-XR) 500 MG 24 hr tablet, TAKE 2 TABLETS BY MOUTH TWICE DAILY FOR DIABETES    Current Outpatient Medications (Analgesics):    acetaminophen (TYLENOL) 500 MG tablet, Take 1,000 mg by mouth every 8 (eight) hours as needed for moderate pain.   Current Outpatient Medications (Other):    ACCU-CHEK GUIDE test strip, TEST UP TO 4 TIMES DAILY AS DIRECTED   blood glucose meter kit and supplies KIT, Dispense based on patient and insurance preference. Use 3 times daily as directed.   Cholecalciferol (DIALYVITE VITAMIN D 5000) 125 MCG (5000 UT) capsule, Take 5,000 Units by mouth daily.   DULoxetine (CYMBALTA) 30 MG capsule, Take 1 capsule (30 mg total) by mouth daily.   Magnesium 250 MG TABS, Take 500 mg by mouth daily.   Misc Natural Products (YUMVS BEET ROOT-TART CHERRY PO), Take 1 tablet by mouth daily.   nitrofurantoin, macrocrystal-monohydrate, (MACROBID) 100 MG capsule, Take  1 capsule  2 x /day with for for 1 week gfor UTI.   zinc gluconate 50 MG tablet, Take 50 mg by mouth daily.  Allergies: Allergies  Allergen Reactions   Gabapentin Swelling     Swelling in legs and feet    Current Problems (verified) has Diabetic polyneuropathy ([place]); Foot drop, bilateral; T2_NIDDM; Vitamin D deficiency; Fatty liver; Cholelithiases; Hyperlipidemia associated with type 2 diabetes mellitus ([place]); Diabetes mellitus type 2 with peripheral artery disease ([place]); Diabetic macular edema of right eye with proliferative retinopathy associated with type 2 diabetes mellitus ([place]); Proliferative diabetic retinopathy of left eye with macular edema associated with type 2 diabetes mellitus ([place]); Right epiretinal membrane; Non-restorative sleep; Snoring; RLS (restless legs syndrome); OSA on CPAP; CKD (chronic kidney disease) stage 2, GFR 60-89 ml/min; Osteoporosis; Lattice corneal dystrophy of both eyes; Epiretinal membrane, left eye; Cellulitis; Acute encephalopathy; and UTI (urinary tract infection) on their problem list.  Screening Tests Immunization History  Administered Date(s) Administered   Influenza, High Dose Seasonal PF 05/12/2016, 07/03/2017, 05/24/2018, 06/10/2019, 05/02/2021, 06/11/2022   Influenza-Unspecified 07/03/2017   PFIZER(Purple Top)SARS-COV-2 Vaccination 09/22/2019, 10/13/2019, 06/15/2020   Pneumococcal Conjugate-13 02/07/2016   Pneumococcal Polysaccharide-23 09/03/2017   Td 02/07/2016   Zoster Recombinat (Shingrix) 10/11/2021, 01/06/2022    Preventative care: Cologuard 06/14/21  negative Last mammogram: 08/24/20 Last pap smear/pelvic exam: s/p TAH   DEXA: Ordered with MGM CXr 2012  Prior vaccinations: TD or Tdap: 2017 Influenza: 2015  Pneumococcal: 2019 Prevnar13: 2017 Shingles/Zostavax: 2012  Names of Other Physician/Practitioners you currently use: 1. [facility]- here for primary care 2. Dr. Nicki Reaper Minor, eye doctor, last visit 05/2021 Dr. Zenaida Niece, dentist, last visit 12/2020 Patient Care Team: Unk Pinto, MD as PCP - General (Internal Medicine) [name] Clent Demark, MD as Consulting Physician  (Ophthalmology) Kathrynn Ducking, MD (Inactive) as Consulting Physician (Neurology) Monna Fam, MD as Consulting Physician (Ophthalmology) Earnstine Regal, PA-C as Physician Assistant (Obstetrics and Gynecology)  Surgical: She  has a past surgical history that includes Tonsillectomy; Abdominal hysterectomy; Hip surgery; Ankle fracture surgery; Cataract extraction, bilateral; Cataract extraction w/PHACO (Right, 02/21/2014); Cataract extraction w/PHACO (Left, 04/10/2014); Direct laryngoscopy (Left, 08/02/2021); and Mass biopsy (Left, 09/06/2021). Family Her family history includes Breast cancer in her maternal aunt; Diabetes in her brother and mother. Social history  She reports that she has never smoked. She has never been exposed to tobacco smoke. She has never used smokeless tobacco. She reports that she does not drink alcohol and does not use drugs.  MEDICARE WELLNESS OBJECTIVES: Physical activity:   Cardiac risk factors:   Depression/mood screen:      12/10/2021   11:14 PM  Depression screen PHQ 2/9  Decreased Interest 0  Down, Depressed, Hopeless 0  PHQ - 2 Score 0    ADLs:     07/02/2022    3:15 PM 03/25/2022   11:00 AM  In your present state of health, do you have any difficulty performing the following activities:  Hearing? 0 0  Vision? 0 0  Difficulty concentrating or making decisions? 0 0  Walking or climbing stairs? 1 1  Dressing or bathing? 1 1  Doing errands, shopping? 1 1     Cognitive Testing  Alert? Yes  Normal Appearance?Yes  Oriented to person? Yes  Place? Yes   Time? Yes  Recall of three objects?  Yes  Can perform simple calculations? Yes  Displays appropriate judgment?Yes  Can read the correct time from a watch face?Yes  EOL planning:     Objective:   There were no vitals filed for this visit.   There is no height or weight on file to calculate BMI.  General Appearance: Well nourished, in no apparent distress.  Eyes: PERRLA, EOMs,  conjunctiva no swelling or erythema, normal fundi and vessels.  Sinuses: No Frontal/maxillary tenderness  ENT/Mouth: Ext aud canals clear, normal light reflex with TMs without erythema, bulging. Good dentition. No erythema, swelling, or exudate on post pharynx. Tonsils not swollen or erythematous. Hearing normal.  Neck: Supple, thyroid normal. No bruits Swelling left neck, possible lymph node Respiratory: Respiratory effort normal, BS equal bilaterally without rales, rhonchi, wheezing or stridor.  Cardio: RRR without murmurs, rubs or gallops. DP and PT slighlty diminished Chest: symmetric, with normal excursions and percussion.  Breasts: Defer to GYN Abdomen: Positive bowel sounds all 4 quadrants, Soft, nontender, no guarding, rebound, hernias, masses, or organomegaly.  Lymphatics: Possible enlarged lymph node left cervical chain, nontender Genitourinary:Defer Musculoskeletal: Full ROM all peripheral extremities,5/5 strength, and normal gait.  Skin: Feet and purple discoloration and cool to touch, Left 3rd toe has skin tear on dorsal side, slight purulent exudate noted. Right 3rd finger has a red,swollen area which is raised and has fluid , 1cm and raised approx 5 cm. Neuro: Cranial nerves intact,  reflexes equal bilaterally. Normal muscle tone, no cerebellar symptoms. Sensation intact with monofilament, PT and DP pulses 2+ Psych: Awake and oriented X 3, normal affect, Insight and Judgment appropriate.    Medicare Attestation I have personally reviewed: The patient's medical and social history Their use of alcohol, tobacco or illicit drugs Their current medications and supplements The patient's functional ability including ADLs,fall risks, home safety risks, cognitive, and hearing and visual impairment Diet and physical activities Evidence for depression or mood disorders  The patient's weight, height, BMI, and visual acuity have been recorded in the chart.  I have made referrals,  counseling, and provided education to the patient based on review of the above and I have provided the patient with a written personalized care plan for preventive services.     Alycia Rossetti, NP   09/30/2022

## 2022-10-01 ENCOUNTER — Ambulatory Visit: Payer: Medicare Other | Admitting: Nurse Practitioner

## 2022-10-01 DIAGNOSIS — Z79899 Other long term (current) drug therapy: Secondary | ICD-10-CM

## 2022-10-01 DIAGNOSIS — E559 Vitamin D deficiency, unspecified: Secondary | ICD-10-CM

## 2022-10-01 DIAGNOSIS — I10 Essential (primary) hypertension: Secondary | ICD-10-CM

## 2022-10-01 DIAGNOSIS — E1122 Type 2 diabetes mellitus with diabetic chronic kidney disease: Secondary | ICD-10-CM

## 2022-10-01 DIAGNOSIS — E1151 Type 2 diabetes mellitus with diabetic peripheral angiopathy without gangrene: Secondary | ICD-10-CM

## 2022-10-01 DIAGNOSIS — Z0000 Encounter for general adult medical examination without abnormal findings: Secondary | ICD-10-CM

## 2022-10-01 DIAGNOSIS — G4733 Obstructive sleep apnea (adult) (pediatric): Secondary | ICD-10-CM

## 2022-10-01 DIAGNOSIS — E11311 Type 2 diabetes mellitus with unspecified diabetic retinopathy with macular edema: Secondary | ICD-10-CM

## 2022-10-01 DIAGNOSIS — K760 Fatty (change of) liver, not elsewhere classified: Secondary | ICD-10-CM

## 2022-10-01 DIAGNOSIS — F321 Major depressive disorder, single episode, moderate: Secondary | ICD-10-CM

## 2022-10-01 DIAGNOSIS — M21371 Foot drop, right foot: Secondary | ICD-10-CM

## 2022-10-01 DIAGNOSIS — E1169 Type 2 diabetes mellitus with other specified complication: Secondary | ICD-10-CM

## 2022-10-01 DIAGNOSIS — E0842 Diabetes mellitus due to underlying condition with diabetic polyneuropathy: Secondary | ICD-10-CM

## 2022-10-02 ENCOUNTER — Other Ambulatory Visit: Payer: Self-pay | Admitting: Internal Medicine

## 2022-10-02 DIAGNOSIS — H35373 Puckering of macula, bilateral: Secondary | ICD-10-CM | POA: Diagnosis not present

## 2022-10-02 DIAGNOSIS — E113411 Type 2 diabetes mellitus with severe nonproliferative diabetic retinopathy with macular edema, right eye: Secondary | ICD-10-CM | POA: Diagnosis not present

## 2022-10-02 DIAGNOSIS — E113412 Type 2 diabetes mellitus with severe nonproliferative diabetic retinopathy with macular edema, left eye: Secondary | ICD-10-CM | POA: Diagnosis not present

## 2022-10-02 DIAGNOSIS — Z1231 Encounter for screening mammogram for malignant neoplasm of breast: Secondary | ICD-10-CM

## 2022-10-04 NOTE — Progress Notes (Signed)
Subjective: Chief Complaint  Patient presents with   Nail Problem    "Toenails"   72 year old female presents the office today for concerns of thick, discolored toenails that she cannot trim herself.  She was told not to use the [place] as it is not good to be helpful.  She gets red discoloration to the toes more in the left than the right.  Objective: AAO x3, NAD DP/PT pulses 1/4 bilaterally, CRT less than 3 seconds Nails are hypertrophic, dystrophic, brittle, discolored, elongated 10. No surrounding redness or drainage. Tenderness nails 1-5 bilaterally. No open lesions or pre-ulcerative lesions are identified today. No pain with calf compression, swelling, warmth, erythema  Assessment: Symptomatic onychomycosis; PAD  Plan: -All treatment options discussed with the patient including all alternatives, risks, complications.  -She will be debrided nails x 10 are any complications or bleeding.  Discussed other treatment options.  For now we will continue with routine debridement. -She previously had a wound that healed uneventfully.  She did have arterial studies performed.  She has monophasic waveforms in the left side.  Discoloration of the skin which seems to be new, refer to vascular surgery. -Patient encouraged to call the office with any questions, concerns, change in symptoms.   Trula Slade DPM

## 2022-10-05 NOTE — Progress Notes (Unsigned)
Office Note     CC: Left foot rubor Requesting Provider:  Unk Pinto, MD  HPI: Grace [name] is a 72 y.o. (12/04/1950) female presenting at the request of her Grace [name] for Left foot rubor. A native of [place], she graduated from [employer] high school.  She raised her 2 children here, who currently lives in [place], works full-time for right to live prior to retirement.  She has longstanding diabetes, and left foot drop that he been present for over 15 years.  She has had surgery on the left ankle, as well as right hip.  Her right hip bothers her after being on her feet for an hour, necessitating her to rest.  Denies classic symptoms of claudication, ischemic rest pain, tissue loss.  She has had wounds on her feet bilaterally which have healed.  She sees podiatry for all of her footcare.  Regarding the erythema, she did not appreciate any changes, and was unaware of the lateral color change until it was brought to her attention by Dr. Jacqualyn [name].  The pt is not on a statin for cholesterol management.  The pt is not on a daily aspirin.   Other AC:  - The pt is not on medication for hypertension.   The pt is  diabetic.  Tobacco hx:  none  Past Medical History:  Diagnosis Date   Abnormality of gait 07/28/2013   Chronic kidney disease    Diabetes (HCC)    Diabetic peripheral neuropathy (HCC)    Diabetic retinopathy ([place])    Difficult intubation    Foot drop, bilateral 07/31/2014   GERD (gastroesophageal reflux disease)    Peripheral edema    Polyneuropathy in diabetes(357.2) 07/28/2013   Sleep apnea    uses cpap   Tongue mass     Past Surgical History:  Procedure Laterality Date   ABDOMINAL HYSTERECTOMY     ANKLE FRACTURE SURGERY     left   CATARACT EXTRACTION W/PHACO Right 02/21/2014   Dr. Herbert Deaner   CATARACT EXTRACTION W/PHACO Left 04/10/2014   Dr. Herbert Deaner   CATARACT EXTRACTION, BILATERAL     DIRECT LARYNGOSCOPY Left 08/02/2021   Procedure: DIRECT LARYNGOSCOPY WITH  TONGUE BIOPSY;  Surgeon: Leta Baptist, MD;  Location: [facility];  Service: ENT;  Laterality: Left;   HIP SURGERY     right   MASS BIOPSY Left 09/06/2021   Procedure: NECK MASS BIOPSY;  Surgeon: Leta Baptist, MD;  Location: [facility];  Service: ENT;  Laterality: Left;   TONSILLECTOMY      Social History   Socioeconomic History   Marital status: Married    Spouse name: Not on file   Number of children: 2   Years of education: college   Highest education level: Not on file  Occupational History   Occupation: part time  Tobacco Use   Smoking status: Never    Passive exposure: Never   Smokeless tobacco: Never  Vaping Use   Vaping Use: Never used  Substance and Sexual Activity   Alcohol use: No   Drug use: No   Sexual activity: Not Currently  Other Topics Concern   Not on file  Social History Narrative   Patient is right handed.   Patient drinks about 4 cups of tea daily.   Social Determinants of Health   Financial Resource Strain: Not on file  Food Insecurity: No Food Insecurity (07/02/2022)   Hunger Vital Sign    Worried About Running Out of Food in the Last  Year: Never true    [place] in the Last Year: Never true  Transportation Needs: No Transportation Needs (07/02/2022)   PRAPARE - Hydrologist (Medical): No    Lack of Transportation (Non-Medical): No  Physical Activity: Not on file  Stress: Not on file  Social Connections: Not on file  Intimate Partner Violence: Not At Risk (07/02/2022)   Humiliation, Afraid, Rape, and Kick questionnaire    Fear of Current or Ex-Partner: No    Emotionally Abused: No    Physically Abused: No    Sexually Abused: No   Family History  Problem Relation Age of Onset   Diabetes Mother    Breast cancer Maternal Aunt    Diabetes Brother    Neuropathy Neg Hx     Current Outpatient Medications  Medication Sig Dispense Refill   ACCU-CHEK GUIDE test strip TEST UP TO 4 TIMES  DAILY AS DIRECTED 100 strip 3   acetaminophen (TYLENOL) 500 MG tablet Take 1,000 mg by mouth every 8 (eight) hours as needed for moderate pain.     blood glucose meter kit and supplies KIT Dispense based on patient and insurance preference. Use 3 times daily as directed. 1 each 0   Cholecalciferol (DIALYVITE VITAMIN D 5000) 125 MCG (5000 UT) capsule Take 5,000 Units by mouth daily.     DULoxetine (CYMBALTA) 30 MG capsule Take 1 capsule (30 mg total) by mouth daily. 90 capsule 3   Magnesium 250 MG TABS Take 500 mg by mouth daily.     metFORMIN (GLUCOPHAGE-XR) 500 MG 24 hr tablet TAKE 2 TABLETS BY MOUTH TWICE DAILY FOR DIABETES 360 tablet 1   Misc Natural Products (YUMVS BEET ROOT-TART CHERRY PO) Take 1 tablet by mouth daily.     nitrofurantoin, macrocrystal-monohydrate, (MACROBID) 100 MG capsule Take  1 capsule  2 x /day with for for 1 week gfor UTI. 14 capsule 0   zinc gluconate 50 MG tablet Take 50 mg by mouth daily.     No current facility-administered medications for this visit.    Allergies  Allergen Reactions   Gabapentin Swelling    Swelling in legs and feet     REVIEW OF SYSTEMS:  [X]$  denotes positive finding, [ ]$  denotes negative finding Cardiac  Comments:  Chest pain or chest pressure:    Shortness of breath upon exertion:    Short of breath when lying flat:    Irregular heart rhythm:        Vascular    Pain in calf, thigh, or hip brought on by ambulation:    Pain in feet at night that wakes you up from your sleep:     Blood clot in your veins:    Leg swelling:         Pulmonary    Oxygen at home:    Productive cough:     Wheezing:         Neurologic    Sudden weakness in arms or legs:     Sudden numbness in arms or legs:     Sudden onset of difficulty speaking or slurred speech:    Temporary loss of vision in one eye:     Problems with dizziness:         Gastrointestinal    Blood in stool:     Vomited blood:         Genitourinary    Burning when  urinating:     Blood in urine:  Psychiatric    Major depression:         Hematologic    Bleeding problems:    Problems with blood clotting too easily:        Skin    Rashes or ulcers:        Constitutional    Fever or chills:      PHYSICAL EXAMINATION:  There were no vitals filed for this visit.  General:  WDWN in NAD; vital signs documented above Gait: Not observed HENT: WNL, normocephalic Pulmonary: normal non-labored breathing , without wheezing Cardiac: regular HR Abdomen: soft, NT, no masses Skin: without rashes Vascular Exam/Pulses:  Right Left  Radial 2+ (normal) 2+ (normal)  Ulnar    Femoral 2+ (normal) 2+ (normal)  Popliteal    DP 2+ (normal) absent  PT absent absent   Extremities: without ischemic changes, without Gangrene , without cellulitis; without open wounds;  Dependent rubor in the left foot.  Normal coloration when elevated.  Signs of cellulitis, infection, skin break, ulceration Musculoskeletal: no muscle wasting or atrophy  Neurologic: A&O X 3;  No focal weakness or paresthesias are detected Psychiatric:  The pt has Normal affect.   Non-Invasive Vascular Imaging:   ABI Findings:  +---------+------------------+-----+----------+--------+  Right   Rt Pressure (mmHg)IndexWaveform  Comment   +---------+------------------+-----+----------+--------+  PTA     170               1.08 biphasic            +---------+------------------+-----+----------+--------+  DP      166               1.05 monophasic          +---------+------------------+-----+----------+--------+  Great Toe106               0.67                     +---------+------------------+-----+----------+--------+   +---------+------------------+-----+----------+-------+  Left    Lt Pressure (mmHg)IndexWaveform  Comment  +---------+------------------+-----+----------+-------+  Brachial 158                                        +---------+------------------+-----+----------+-------+  PTA     159               1.01 monophasic         +---------+------------------+-----+----------+-------+  DP      150               0.95 monophasic         +---------+------------------+-----+----------+-------+  Great Toe105               0.66                    +---------+------------------+-----+----------+-------+   +-------+-----------+-----------+------------+------------+  ABI/TBIToday's ABIToday's TBIPrevious ABIPrevious TBI  +-------+-----------+-----------+------------+------------+  Right 1.08       0.67       1.10        0.68          +-------+-----------+-----------+------------+------------+  Left  1.01       0.66       1.14        0.58          +-------+-----------+-----------+------------+------------+      ASSESSMENT/PLAN: LATRIECE WEHRS is a 72 y.o. female presenting with isolated left foot rubor.  ABIs reviewed demonstrating peripheral  arterial disease bilaterally with mono-biphasic waveforms at the level of the ankle.  Bilaterally are sufficient for wound healing.  On physical exam, she had a palpable pulse on the right, nonpalpable on the left foot.  The left foot rubor was dependent, and there are no signs of infection.  Dependent rubor be seen in patients with peripheral arterial disease.  This does not necessitate intervention, specially since she is asymptomatic.  Anya and I had a long discussion regarding the above.  She would benefit from yearly follow-up with ABI.  I asked her to call my office immediately should new wounds develop that do not heal, or rest pain occur. I asked that she no longer trim her toenails, and leave all podiatry work to Dr. Jacqualyn [name].  I also asked that she wear shoes in the house due to her bilateral lower extremity neuropathy.   Recommend the following which can slow the progression of atherosclerosis and reduce the risk of major  adverse cardiac / limb events:  Aspirin 25m PO QD.  Atorvastatin 40-80mg PO QD (or other "high intensity" statin therapy). Complete cessation from all tobacco products. Blood glucose control with goal A1c < 7%. Blood pressure control with goal blood pressure < 140/90 mmHg. Lipid reduction therapy with goal LDL-C <100 mg/dL (<70 if symptomatic from PAD).     JBroadus John MD [facility] [phone] time of patient care including pre-visit research, consultation, and documentation greater than 45 minutes

## 2022-10-06 ENCOUNTER — Ambulatory Visit (INDEPENDENT_AMBULATORY_CARE_PROVIDER_SITE_OTHER): Payer: Medicare Other | Admitting: Vascular Surgery

## 2022-10-06 ENCOUNTER — Encounter: Payer: Self-pay | Admitting: Vascular Surgery

## 2022-10-06 VITALS — BP 139/81 | HR 72 | Temp 97.3°F | Ht 64.0 in | Wt 143.0 lb

## 2022-10-06 DIAGNOSIS — I739 Peripheral vascular disease, unspecified: Secondary | ICD-10-CM | POA: Diagnosis not present

## 2022-10-08 NOTE — Progress Notes (Unsigned)
MEDICARE ANNUAL WELLNESS VISIT AND FOLLOW UP  Assessment:   Encounter for Medicare annual wellness exam 1 year  Diabetic polyneuropathy associated with diabetes mellitus due to underlying condition ([place]) Continue follow up  Type 2 diabetes mellitus with stage 2 chronic kidney disease, without long-term current use of insulin (HCC) Increase fluids, avoid NSAIDS, monitor sugars, will monitor Discussed trulicity to help lower A1c , pt will consider- is not ready to start now - A1c  Type 2 diabetes mellitus with hyperlipidemia ([place]) STRONGLY suggest getting on low dose statin, patient declines at this time, will consider.  decrease fatty foods increase activity.  - lipid panel  Current moderate episode of depression(HCC) Suggested therapy and possible adjustment of medication, currently on Cymbalta for pain- pt refuses at this time.  Will try to increase exercise Instructed patient to contact office or on-call physician promptly should condition worsen or any new symptoms appear. IF THE PATIENT HAS ANY SUICIDAL OR HOMICIDAL IDEATIONS, CALL THE OFFICE, DISCUSS WITH A SUPPORT MEMBER, OR GO TO THE ER IMMEDIATELY. Patient was agreeable with this plan.   Diabetes mellitus type 2 with peripheral artery disease ([place]) - suggest repeat of ABI, patient declines at this time.   Diabetes mellitus with macular edema ([place]) - continue follow up every 6 weeks.   Medication management Continued  Mixed hyperlipidemia Associated with Type 2 Diabetes Mellitus(HCC) -     Continue diet and exercise  Essential hypertension - continue medications, DASH diet, exercise and monitor at home. Call if greater than 130/80. - CBC, CMP  Foot drop, bilateral Has leg braces and follows with ortho  Vitamin D deficiency Continue Vit D supplementation   Fatty liver Check labs, avoid tylenol, alcohol, weight loss advised.   OSA on CPAP Continue  Hypercalcemia - CMP - PTH  Over 30 minutes of exam,  counseling, chart review, and critical decision making was performed  Future Appointments  Date Time Provider [place]  10/09/2022  2:00 PM Alycia Rossetti, NP [facility] None  11/17/2022  2:20 PM [facility] MM 3 [facility]  12/29/2022  9:45 AM Trula Slade, DPM [facility]  01/22/2023  8:45 AM Suzzanne Cloud, NP [facility] None  06/12/2023 10:00 AM Alycia Rossetti, NP [facility] None  10/02/2023 11:00 AM Alycia Rossetti, NP [facility] None    Plan:   During the course of the visit the patient was educated and counseled about appropriate screening and preventive services including:   Pneumococcal vaccine  Influenza vaccine Td vaccine Prevnar 13 Screening electrocardiogram Screening mammography Bone densitometry screening Colorectal cancer screening Diabetes screening Glaucoma screening Nutrition counseling  Advanced directives: given info/requested copies   Subjective:   Grace [name] is a 72 y.o. female who presents for Medicare Annual Wellness Visit and 3 month follow up on hypertension, diabetes, hyperlipidemia, vitamin D def.   U/S of cervical lymph node in 1-2 weeks is scheduled She is interested in COVID vaccine when it becomes available.   BMI is There is no height or weight on file to calculate BMI., she is working on diet and exercise. Wt Readings from Last 3 Encounters:  10/06/22 143 lb (64.9 kg)  09/02/22 146 lb 12.8 oz (66.6 kg)  07/30/22 148 lb (67.1 kg)    Her blood pressure is not checked at home, today their BP is   She does not workout. She denies chest pain, shortness of breath, dizziness.   She does have a history of diabetes managed with medication, diet, and  exercise.  With PAD had ABI in 2015 that showed mild disease, not on ASA due to [place] Degen She has CKD She has neuropathy which is extensive and requires her to use a walker to avoid falls, she is on cymbalta for neuropathy. She has braces on her legs to help  prevent falls, no falls in past year but she is high risk.  With hyperlipidemia not at goal- long discussion about statins, will check today and see where we are at She has macular degeneration and edema following with Dr. [name], not on HCTZ due to this She has not checked her blood sugars Has accucheck she is on 4 metformin a day   1 amaryl a day with largest meal no low blood sugar.    Lab Results  Component Value Date   HGBA1C 7.1 (H) 06/11/2022   Last GFR Lab Results  Component Value Date   GFRNONAA 60 (L) 07/03/2022   Lab Results  Component Value Date   CHOL 192 06/11/2022   HDL 60 06/11/2022   LDLCALC 98 06/11/2022   TRIG 215 (H) 06/11/2022   CHOLHDL 3.2 06/11/2022    Patient is on Vitamin D supplement. Lab Results  Component Value Date   VD25OH 76 06/11/2022      Medication Review  Current Outpatient Medications (Endocrine & Metabolic):    metFORMIN (GLUCOPHAGE-XR) 500 MG 24 hr tablet, TAKE 2 TABLETS BY MOUTH TWICE DAILY FOR DIABETES    Current Outpatient Medications (Analgesics):    acetaminophen (TYLENOL) 500 MG tablet, Take 1,000 mg by mouth every 8 (eight) hours as needed for moderate pain.   Current Outpatient Medications (Other):    ACCU-CHEK GUIDE test strip, TEST UP TO 4 TIMES DAILY AS DIRECTED   blood glucose meter kit and supplies KIT, Dispense based on patient and insurance preference. Use 3 times daily as directed.   Cholecalciferol (DIALYVITE VITAMIN D 5000) 125 MCG (5000 UT) capsule, Take 5,000 Units by mouth daily.   DULoxetine (CYMBALTA) 30 MG capsule, Take 1 capsule (30 mg total) by mouth daily.   Magnesium 250 MG TABS, Take 500 mg by mouth daily.   Misc Natural Products (YUMVS BEET ROOT-TART CHERRY PO), Take 1 tablet by mouth daily.   nitrofurantoin, macrocrystal-monohydrate, (MACROBID) 100 MG capsule, Take  1 capsule  2 x /day with for for 1 week gfor UTI. (Patient not taking: Reported on 10/06/2022)   zinc gluconate 50 MG tablet, Take 50  mg by mouth daily.  Allergies: Allergies  Allergen Reactions   Gabapentin Swelling    Swelling in legs and feet    Current Problems (verified) has Diabetic polyneuropathy ([place]); Foot drop, bilateral; T2_NIDDM; Vitamin D deficiency; Fatty liver; Cholelithiases; Hyperlipidemia associated with type 2 diabetes mellitus ([place]); Diabetes mellitus type 2 with peripheral artery disease ([place]); Diabetic macular edema of right eye with proliferative retinopathy associated with type 2 diabetes mellitus ([place]); Proliferative diabetic retinopathy of left eye with macular edema associated with type 2 diabetes mellitus ([place]); Right epiretinal membrane; Non-restorative sleep; Snoring; RLS (restless legs syndrome); OSA on CPAP; CKD (chronic kidney disease) stage 2, GFR 60-89 ml/min; Osteoporosis; Lattice corneal dystrophy of both eyes; Epiretinal membrane, left eye; Cellulitis; Acute encephalopathy; and UTI (urinary tract infection) on their problem list.  Screening Tests Immunization History  Administered Date(s) Administered   Influenza, High Dose Seasonal PF 05/12/2016, 07/03/2017, 05/24/2018, 06/10/2019, 05/02/2021, 06/11/2022   Influenza-Unspecified 07/03/2017   PFIZER(Purple Top)SARS-COV-2 Vaccination 09/22/2019, 10/13/2019, 06/15/2020   Pneumococcal Conjugate-13 02/07/2016   Pneumococcal Polysaccharide-23 09/03/2017  Td 02/07/2016   Zoster Recombinat (Shingrix) 10/11/2021, 01/06/2022    Preventative care: Cologuard 06/14/21 negative Last mammogram: 08/24/20 Last pap smear/pelvic exam: s/p TAH   DEXA: Ordered with [facility] CXr 2012  Prior vaccinations: TD or Tdap: 2017 Influenza: 2015  Pneumococcal: 2019 Prevnar13: 2017 Shingles/Zostavax: 2012  Names of Other Physician/Practitioners you currently use: 1. [facility]- here for primary care 2. Dr. Nicki Reaper Minor, eye doctor, last visit 05/2021 Dr. Zenaida Niece, dentist, last visit 12/2020 Patient Care Team: Unk Pinto, MD as PCP - General (Internal Medicine) [name] Clent Demark, MD as Consulting Physician (Ophthalmology) Kathrynn Ducking, MD (Inactive) as Consulting Physician (Neurology) Monna Fam, MD as Consulting Physician (Ophthalmology) Earnstine Regal, PA-C as Physician Assistant (Obstetrics and Gynecology)  Surgical: She  has a past surgical history that includes Tonsillectomy; Abdominal hysterectomy; Hip surgery; Ankle fracture surgery; Cataract extraction, bilateral; Cataract extraction w/PHACO (Right, 02/21/2014); Cataract extraction w/PHACO (Left, 04/10/2014); Direct laryngoscopy (Left, 08/02/2021); and Mass biopsy (Left, 09/06/2021). Family Her family history includes Breast cancer in her maternal aunt; Diabetes in her brother and mother. Social history  She reports that she has never smoked. She has never been exposed to tobacco smoke. She has never used smokeless tobacco. She reports that she does not drink alcohol and does not use drugs.  MEDICARE WELLNESS OBJECTIVES: Physical activity:   Cardiac risk factors:   Depression/mood screen:      12/10/2021   11:14 PM  Depression screen PHQ 2/9  Decreased Interest 0  Down, Depressed, Hopeless 0  PHQ - 2 Score 0    ADLs:     07/02/2022    3:15 PM 03/25/2022   11:00 AM  In your present state of health, do you have any difficulty performing the following activities:  Hearing? 0 0  Vision? 0 0  Difficulty concentrating or making decisions? 0 0  Walking or climbing stairs? 1 1  Dressing or bathing? 1 1  Doing errands, shopping? 1 1     Cognitive Testing  Alert? Yes  Normal Appearance?Yes  Oriented to person? Yes  Place? Yes   Time? Yes  Recall of three objects?  Yes  Can perform simple calculations? Yes  Displays appropriate judgment?Yes  Can read the correct time from a watch face?Yes  EOL planning:     Objective:   There were no vitals filed for this visit.   There is no height or weight on file to calculate  BMI.  General Appearance: Well nourished, in no apparent distress.  Eyes: PERRLA, EOMs, conjunctiva no swelling or erythema, normal fundi and vessels.  Sinuses: No Frontal/maxillary tenderness  ENT/Mouth: Ext aud canals clear, normal light reflex with TMs without erythema, bulging. Good dentition. No erythema, swelling, or exudate on post pharynx. Tonsils not swollen or erythematous. Hearing normal.  Neck: Supple, thyroid normal. No bruits Swelling left neck, possible lymph node Respiratory: Respiratory effort normal, BS equal bilaterally without rales, rhonchi, wheezing or stridor.  Cardio: RRR without murmurs, rubs or gallops. DP and PT slighlty diminished Chest: symmetric, with normal excursions and percussion.  Breasts: Defer to GYN Abdomen: Positive bowel sounds all 4 quadrants, Soft, nontender, no guarding, rebound, hernias, masses, or organomegaly.  Lymphatics: Possible enlarged lymph node left cervical chain, nontender Genitourinary:Defer Musculoskeletal: Full ROM all peripheral extremities,5/5 strength, and normal gait.  Skin: Feet and purple discoloration and cool to touch, Left 3rd toe has skin tear on dorsal side, slight purulent exudate noted. Right 3rd finger has a red,swollen area which  is raised and has fluid , 1cm and raised approx 5 cm. Neuro: Cranial nerves intact, reflexes equal bilaterally. Normal muscle tone, no cerebellar symptoms. Sensation intact with monofilament, PT and DP pulses 2+ Psych: Awake and oriented X 3, normal affect, Insight and Judgment appropriate.    Medicare Attestation I have personally reviewed: The patient's medical and social history Their use of alcohol, tobacco or illicit drugs Their current medications and supplements The patient's functional ability including ADLs,fall risks, home safety risks, cognitive, and hearing and visual impairment Diet and physical activities Evidence for depression or mood disorders  The patient's weight, height,  BMI, and visual acuity have been recorded in the chart.  I have made referrals, counseling, and provided education to the patient based on review of the above and I have provided the patient with a written personalized care plan for preventive services.     Alycia Rossetti, NP   10/08/2022

## 2022-10-09 ENCOUNTER — Ambulatory Visit (INDEPENDENT_AMBULATORY_CARE_PROVIDER_SITE_OTHER): Payer: Medicare Other | Admitting: Nurse Practitioner

## 2022-10-09 ENCOUNTER — Encounter: Payer: Self-pay | Admitting: Nurse Practitioner

## 2022-10-09 VITALS — BP 102/68 | HR 74 | Temp 97.5°F | Ht 64.0 in | Wt 146.2 lb

## 2022-10-09 DIAGNOSIS — Z79899 Other long term (current) drug therapy: Secondary | ICD-10-CM | POA: Diagnosis not present

## 2022-10-09 DIAGNOSIS — R6889 Other general symptoms and signs: Secondary | ICD-10-CM

## 2022-10-09 DIAGNOSIS — I10 Essential (primary) hypertension: Secondary | ICD-10-CM

## 2022-10-09 DIAGNOSIS — E1122 Type 2 diabetes mellitus with diabetic chronic kidney disease: Secondary | ICD-10-CM

## 2022-10-09 DIAGNOSIS — E559 Vitamin D deficiency, unspecified: Secondary | ICD-10-CM | POA: Diagnosis not present

## 2022-10-09 DIAGNOSIS — Z0001 Encounter for general adult medical examination with abnormal findings: Secondary | ICD-10-CM

## 2022-10-09 DIAGNOSIS — M21371 Foot drop, right foot: Secondary | ICD-10-CM

## 2022-10-09 DIAGNOSIS — E11311 Type 2 diabetes mellitus with unspecified diabetic retinopathy with macular edema: Secondary | ICD-10-CM | POA: Diagnosis not present

## 2022-10-09 DIAGNOSIS — E08311 Diabetes mellitus due to underlying condition with unspecified diabetic retinopathy with macular edema: Secondary | ICD-10-CM | POA: Diagnosis not present

## 2022-10-09 DIAGNOSIS — M810 Age-related osteoporosis without current pathological fracture: Secondary | ICD-10-CM

## 2022-10-09 DIAGNOSIS — E1151 Type 2 diabetes mellitus with diabetic peripheral angiopathy without gangrene: Secondary | ICD-10-CM | POA: Diagnosis not present

## 2022-10-09 DIAGNOSIS — N182 Chronic kidney disease, stage 2 (mild): Secondary | ICD-10-CM | POA: Diagnosis not present

## 2022-10-09 DIAGNOSIS — M21372 Foot drop, left foot: Secondary | ICD-10-CM

## 2022-10-09 DIAGNOSIS — E1169 Type 2 diabetes mellitus with other specified complication: Secondary | ICD-10-CM

## 2022-10-09 DIAGNOSIS — G4733 Obstructive sleep apnea (adult) (pediatric): Secondary | ICD-10-CM

## 2022-10-09 DIAGNOSIS — F321 Major depressive disorder, single episode, moderate: Secondary | ICD-10-CM | POA: Diagnosis not present

## 2022-10-09 DIAGNOSIS — E785 Hyperlipidemia, unspecified: Secondary | ICD-10-CM

## 2022-10-09 DIAGNOSIS — K760 Fatty (change of) liver, not elsewhere classified: Secondary | ICD-10-CM

## 2022-10-09 DIAGNOSIS — E0842 Diabetes mellitus due to underlying condition with diabetic polyneuropathy: Secondary | ICD-10-CM

## 2022-10-09 DIAGNOSIS — Z0000 Encounter for general adult medical examination without abnormal findings: Secondary | ICD-10-CM

## 2022-10-09 NOTE — Patient Instructions (Signed)

## 2022-10-10 ENCOUNTER — Other Ambulatory Visit: Payer: Self-pay | Admitting: Nurse Practitioner

## 2022-10-10 DIAGNOSIS — E875 Hyperkalemia: Secondary | ICD-10-CM

## 2022-10-10 LAB — CBC WITH DIFFERENTIAL/PLATELET
Absolute Monocytes: 552 cells/uL (ref 200–950)
Basophils Absolute: 64 cells/uL (ref 0–200)
Basophils Relative: 0.7 %
Eosinophils Absolute: 202 cells/uL (ref 15–500)
Eosinophils Relative: 2.2 %
HCT: 35.2 % (ref 35.0–45.0)
Hemoglobin: 11.4 g/dL — ABNORMAL LOW (ref 11.7–15.5)
Lymphs Abs: 2125 cells/uL (ref 850–3900)
MCH: 29.4 pg (ref 27.0–33.0)
MCHC: 32.4 g/dL (ref 32.0–36.0)
MCV: 90.7 fL (ref 80.0–100.0)
MPV: 9.5 fL (ref 7.5–12.5)
Monocytes Relative: 6 %
Neutro Abs: 6256 cells/uL (ref 1500–7800)
Neutrophils Relative %: 68 %
Platelets: 443 10*3/uL — ABNORMAL HIGH (ref 140–400)
RBC: 3.88 10*6/uL (ref 3.80–5.10)
RDW: 14.1 % (ref 11.0–15.0)
Total Lymphocyte: 23.1 %
WBC: 9.2 10*3/uL (ref 3.8–10.8)

## 2022-10-10 LAB — LIPID PANEL
Cholesterol: 185 mg/dL (ref <200)
HDL: 66 mg/dL (ref >=50)
LDL Cholesterol (Calc): 89 mg/dL (calc)
Non-HDL Cholesterol (Calc): 119 mg/dL (calc) (ref <130)
Total CHOL/HDL Ratio: 2.8 (calc) (ref <5.0)
Triglycerides: 201 mg/dL — ABNORMAL HIGH (ref <150)

## 2022-10-10 LAB — COMPLETE METABOLIC PANEL WITH GFR
AG Ratio: 1.7 (calc) (ref 1.0–2.5)
ALT: 8 U/L (ref 6–29)
AST: 10 U/L (ref 10–35)
Albumin: 3.9 g/dL (ref 3.6–5.1)
Alkaline phosphatase (APISO): 55 U/L (ref 37–153)
BUN: 22 mg/dL (ref 7–25)
CO2: 31 mmol/L (ref 20–32)
Calcium: 9.8 mg/dL (ref 8.6–10.4)
Chloride: 98 mmol/L (ref 98–110)
Creat: 0.92 mg/dL (ref 0.60–1.00)
Globulin: 2.3 g/dL (calc) (ref 1.9–3.7)
Glucose, Bld: 214 mg/dL — ABNORMAL HIGH (ref 65–99)
Potassium: 5.8 mmol/L — ABNORMAL HIGH (ref 3.5–5.3)
Sodium: 138 mmol/L (ref 135–146)
Total Bilirubin: 0.2 mg/dL (ref 0.2–1.2)
Total Protein: 6.2 g/dL (ref 6.1–8.1)
eGFR: 66 mL/min/{1.73_m2} (ref >=60)

## 2022-10-10 LAB — PTH, INTACT AND CALCIUM
Calcium: 9.8 mg/dL (ref 8.6–10.4)
PTH: 43 pg/mL (ref 16–77)

## 2022-10-10 LAB — HEMOGLOBIN A1C
Hgb A1c MFr Bld: 9.4 % of total Hgb — ABNORMAL HIGH (ref <5.7)
Mean Plasma Glucose: 223 mg/dL
eAG (mmol/L): 12.4 mmol/L

## 2022-10-14 ENCOUNTER — Other Ambulatory Visit: Payer: Self-pay | Admitting: Podiatry

## 2022-10-16 DIAGNOSIS — H35371 Puckering of macula, right eye: Secondary | ICD-10-CM | POA: Diagnosis not present

## 2022-10-16 DIAGNOSIS — E113412 Type 2 diabetes mellitus with severe nonproliferative diabetic retinopathy with macular edema, left eye: Secondary | ICD-10-CM | POA: Diagnosis not present

## 2022-10-16 DIAGNOSIS — E113411 Type 2 diabetes mellitus with severe nonproliferative diabetic retinopathy with macular edema, right eye: Secondary | ICD-10-CM | POA: Diagnosis not present

## 2022-10-17 ENCOUNTER — Ambulatory Visit (INDEPENDENT_AMBULATORY_CARE_PROVIDER_SITE_OTHER): Payer: Medicare Other

## 2022-10-17 DIAGNOSIS — E875 Hyperkalemia: Secondary | ICD-10-CM

## 2022-10-17 NOTE — Progress Notes (Signed)
Patient presents to the office for a nurse visit to have labs done to recheck Potassium levels. States that her levels have always been like this.

## 2022-10-18 LAB — BASIC METABOLIC PANEL WITH GFR
BUN/Creatinine Ratio: 28 (calc) — ABNORMAL HIGH (ref 6–22)
BUN: 26 mg/dL — ABNORMAL HIGH (ref 7–25)
CO2: 29 mmol/L (ref 20–32)
Calcium: 10 mg/dL (ref 8.6–10.4)
Chloride: 101 mmol/L (ref 98–110)
Creat: 0.93 mg/dL (ref 0.60–1.00)
Glucose, Bld: 108 mg/dL — ABNORMAL HIGH (ref 65–99)
Potassium: 5.5 mmol/L — ABNORMAL HIGH (ref 3.5–5.3)
Sodium: 139 mmol/L (ref 135–146)
eGFR: 65 mL/min/{1.73_m2} (ref >=60)

## 2022-10-21 DIAGNOSIS — K5904 Chronic idiopathic constipation: Secondary | ICD-10-CM | POA: Diagnosis not present

## 2022-10-21 DIAGNOSIS — N8111 Cystocele, midline: Secondary | ICD-10-CM | POA: Diagnosis not present

## 2022-10-21 DIAGNOSIS — N816 Rectocele: Secondary | ICD-10-CM | POA: Diagnosis not present

## 2022-10-27 ENCOUNTER — Telehealth: Payer: Self-pay | Admitting: Nurse Practitioner

## 2022-10-27 NOTE — Telephone Encounter (Signed)
Made in error, pt needing to call insurance and see which test strips are covered

## 2022-10-30 ENCOUNTER — Other Ambulatory Visit: Payer: Self-pay | Admitting: Nurse Practitioner

## 2022-10-30 ENCOUNTER — Telehealth: Payer: Self-pay | Admitting: Nurse Practitioner

## 2022-10-30 DIAGNOSIS — E1122 Type 2 diabetes mellitus with diabetic chronic kidney disease: Secondary | ICD-10-CM

## 2022-10-30 MED ORDER — ACCU-CHEK GUIDE VI STRP: ORAL_STRIP | 3 refills | Status: DC

## 2022-10-30 NOTE — Telephone Encounter (Signed)
Patient needs refill on Accu-Chek test strips but wants them to go to [facility] on [address]

## 2022-11-05 ENCOUNTER — Telehealth: Payer: Self-pay | Admitting: Nurse Practitioner

## 2022-11-05 ENCOUNTER — Other Ambulatory Visit: Payer: Self-pay

## 2022-11-05 ENCOUNTER — Other Ambulatory Visit: Payer: Self-pay | Admitting: Nurse Practitioner

## 2022-11-05 DIAGNOSIS — E1122 Type 2 diabetes mellitus with diabetic chronic kidney disease: Secondary | ICD-10-CM

## 2022-11-05 MED ORDER — ACCU-CHEK GUIDE VI STRP: ORAL_STRIP | 3 refills | Status: DC

## 2022-11-05 NOTE — Telephone Encounter (Signed)
LMOM for patient to call back to tell me what strips she uses.

## 2022-11-05 NOTE — Telephone Encounter (Signed)
Per patient's husband, pharmacy cannot fill test strip rx until they get info from [place].Marland KitchenMarland Kitchen

## 2022-11-05 NOTE — Telephone Encounter (Signed)
New RX sent for strips

## 2022-11-05 NOTE — Telephone Encounter (Signed)
Please call patient to get exact information on test strips and send to pharmacy

## 2022-11-17 ENCOUNTER — Ambulatory Visit
Admission: RE | Admit: 2022-11-17 | Discharge: 2022-11-17 | Disposition: A | Discharge status: Home / Self Care | Payer: Medicare Other | Source: Ambulatory Visit | Attending: Internal Medicine | Admitting: Internal Medicine

## 2022-11-17 DIAGNOSIS — Z1231 Encounter for screening mammogram for malignant neoplasm of breast: Secondary | ICD-10-CM | POA: Diagnosis not present

## 2022-11-20 DIAGNOSIS — H35373 Puckering of macula, bilateral: Secondary | ICD-10-CM | POA: Diagnosis not present

## 2022-11-20 DIAGNOSIS — E113412 Type 2 diabetes mellitus with severe nonproliferative diabetic retinopathy with macular edema, left eye: Secondary | ICD-10-CM | POA: Diagnosis not present

## 2022-11-20 DIAGNOSIS — E113411 Type 2 diabetes mellitus with severe nonproliferative diabetic retinopathy with macular edema, right eye: Secondary | ICD-10-CM | POA: Diagnosis not present

## 2022-12-11 DIAGNOSIS — H35371 Puckering of macula, right eye: Secondary | ICD-10-CM | POA: Diagnosis not present

## 2022-12-11 DIAGNOSIS — E113412 Type 2 diabetes mellitus with severe nonproliferative diabetic retinopathy with macular edema, left eye: Secondary | ICD-10-CM | POA: Diagnosis not present

## 2022-12-11 DIAGNOSIS — E113411 Type 2 diabetes mellitus with severe nonproliferative diabetic retinopathy with macular edema, right eye: Secondary | ICD-10-CM | POA: Diagnosis not present

## 2022-12-11 LAB — HM DIABETES EYE EXAM

## 2022-12-16 ENCOUNTER — Encounter: Payer: Self-pay | Admitting: Internal Medicine

## 2022-12-16 DIAGNOSIS — N816 Rectocele: Secondary | ICD-10-CM | POA: Diagnosis not present

## 2022-12-16 DIAGNOSIS — R278 Other lack of coordination: Secondary | ICD-10-CM | POA: Diagnosis not present

## 2022-12-29 ENCOUNTER — Encounter: Payer: Self-pay | Admitting: Podiatry

## 2022-12-29 ENCOUNTER — Ambulatory Visit (INDEPENDENT_AMBULATORY_CARE_PROVIDER_SITE_OTHER): Payer: Medicare Other | Admitting: Podiatry

## 2022-12-29 DIAGNOSIS — M79674 Pain in right toe(s): Secondary | ICD-10-CM

## 2022-12-29 DIAGNOSIS — I739 Peripheral vascular disease, unspecified: Secondary | ICD-10-CM

## 2022-12-29 DIAGNOSIS — M79675 Pain in left toe(s): Secondary | ICD-10-CM

## 2022-12-29 DIAGNOSIS — B351 Tinea unguium: Secondary | ICD-10-CM | POA: Diagnosis not present

## 2022-12-29 NOTE — Progress Notes (Signed)
Subjective: Chief Complaint  Patient presents with   Debridement    Trim toenails    72 year old female presents the office today for concerns of thick, discolored toenails that she cannot trim herself.  She has no other concerns today.  Objective: AAO x3, NAD DP/PT pulses 1/4 bilaterally, CRT less than 3 seconds Nails are hypertrophic, dystrophic, brittle, discolored, elongated 10. No surrounding redness or drainage. Tenderness nails 1-5 bilaterally. No open lesions or pre-ulcerative lesions are identified today. No pain with calf compression, swelling, warmth, erythema  Assessment: Symptomatic onychomycosis; PAD  Plan: -All treatment options discussed with the patient including all alternatives, risks, complications.  -She will be debrided nails x 10 are any complications or bleeding.   For now we will continue with routine debridement.  Previously was prescribed Penlac but she was told it would not be helpful since she has not been using it. -Patient encouraged to call the office with any questions, concerns, change in symptoms.   Vivi Barrack DPM

## 2023-01-01 DIAGNOSIS — H35371 Puckering of macula, right eye: Secondary | ICD-10-CM | POA: Diagnosis not present

## 2023-01-01 DIAGNOSIS — H35372 Puckering of macula, left eye: Secondary | ICD-10-CM | POA: Diagnosis not present

## 2023-01-01 DIAGNOSIS — E113412 Type 2 diabetes mellitus with severe nonproliferative diabetic retinopathy with macular edema, left eye: Secondary | ICD-10-CM | POA: Diagnosis not present

## 2023-01-01 DIAGNOSIS — E113411 Type 2 diabetes mellitus with severe nonproliferative diabetic retinopathy with macular edema, right eye: Secondary | ICD-10-CM | POA: Diagnosis not present

## 2023-01-15 ENCOUNTER — Telehealth: Payer: Self-pay

## 2023-01-15 NOTE — Telephone Encounter (Signed)
Left detailed msg to bring cpap machine and power cord to appt since I was not able to pull any data

## 2023-01-19 NOTE — Telephone Encounter (Signed)
Pt called back. I informed her of [place] message. Pt said she will bring machine and powder cord to appointment.

## 2023-01-19 NOTE — Telephone Encounter (Signed)
2nd attempt LVM

## 2023-01-21 ENCOUNTER — Encounter: Payer: Self-pay | Admitting: Internal Medicine

## 2023-01-21 NOTE — Progress Notes (Unsigned)
PATIENT: Grace [name] DOB: July 22, 1951  REASON FOR VISIT: follow up for CPAP, neuropathy  HISTORY FROM: patient PRIMARY NEUROLOGIST: Grace [name] for neuropathy/ Grace [name] for sleep  HISTORY OF PRESENT ILLNESS: Today 01/22/23  Has been off CPAP for 1 year without any change to her eye disease for diabetic retinopathy. There was thought that CPAP would help her eye disease, she noted no change. She doesn't want to restart it. Her OSA was mild back in 2021, AHI of 8.9/h and REM AHI of 12.8/h. She used for 2 years without any change to her daytime sleepiness. She isn't sure if she snores, because husband sleeps in another room. Remains on Cymbalta 30 mg daily for neuropathy, very helpful for pain. Doesn't drive due to vision issues. Uses walker, no falls. Had UTI sepsis in November requiring hospitalization, set her back, just now getting back to normal. Neuropathy no longer keeps he awake since the Cymbalta. Last A1C in Feb 2023 was 9.4, had taken off her diabetic medication during illness, is now back on.   Update 01/22/22 SS: Grace [name] is here today for follow-up. Takes Cymbalta 30 mg daily for neuropathy. Not a lot of pain, sleeps well. Using walker, bilateral AFOs. No falls. Lives with husband, she doesn't drive mostly due to vision. Sees Dr. Luciana [name] for macular edema, diabetic retinopathy.  Recent A1c was 7.5.  Dr. Luciana [name] feels CPAP use has provided stability for her vision issues.  She personally has not experienced any benefit with CPAP use.  She would like to discontinue it if possible.  Continues with daytime drowsiness, but feels mostly related to lack of activity, not able to do much with chronic illness.  Uses.  ESS was 10, review of CPAP download is overall good, there are a few nights she had 0 usage due to traveling.  Her AHI is 6.8.   01/22/2021 SS: Grace [name] is a 72 year old female with history of diabetes and peripheral neuropathy.  Doing well on low-dose Cymbalta. On CPAP, visit  in October 2021 showed excellent compliance data. A1C 7.5 recently. Neuropathy pain is well controlled, doesn't bother her at all. Getting around well, no falls. Numbness to both feet, no sharp pains. Has bilateral leg braces. Seeing Dr. Luciana [name], he felt the CPAP would be helpful for retinopathy, there has been improvement, she can't tell any difference. The CPAP is bothersome to her. Uses 4-5 hours a night. Switched to nasal pillows, some better. The main drive for using CPAP is to improve vision. Hasn't driven in 2 years due to vision, her husband drives her around. ESS 10.  Review of recent CPAP download, indicates excellent compliance, AHI is well treated 3.2, leak is 10.7.     REVIEW OF SYSTEMS: Out of a complete 14 system review of symptoms, the patient complains only of the following symptoms, and all other reviewed systems are negative.  See HPI  ALLERGIES: Allergies  Allergen Reactions   Gabapentin Swelling    Swelling in legs and feet    HOME MEDICATIONS: Outpatient Medications Prior to Visit  Medication Sig Dispense Refill   acetaminophen (TYLENOL) 500 MG tablet Take 1,000 mg by mouth every 8 (eight) hours as needed for moderate pain.     blood glucose meter kit and supplies KIT Dispense based on patient and insurance preference. Use 3 times daily as directed. 1 each 0   Cholecalciferol (DIALYVITE VITAMIN D 5000) 125 MCG (5000 UT) capsule Take 5,000 Units by mouth daily.     glucose blood (  ACCU-CHEK GUIDE) test strip TEST ONCE DAILY AS DIRECTED 100 strip 3   metFORMIN (GLUCOPHAGE-XR) 500 MG 24 hr tablet TAKE 2 TABLETS BY MOUTH TWICE DAILY FOR DIABETES 360 tablet 1   Misc Natural Products (YUMVS BEET ROOT-TART CHERRY PO) Take 1 tablet by mouth daily.     zinc gluconate 50 MG tablet Take 50 mg by mouth daily.     DULoxetine (CYMBALTA) 30 MG capsule Take 1 capsule (30 mg total) by mouth daily. 90 capsule 3   estradiol (ESTRACE) 0.1 MG/GM vaginal cream Place vaginally. (Patient not  taking: Reported on 01/22/2023)     Magnesium 250 MG TABS Take 500 mg by mouth daily.     nitrofurantoin, macrocrystal-monohydrate, (MACROBID) 100 MG capsule Take  1 capsule  2 x /day with for for 1 week gfor UTI. (Patient not taking: Reported on 10/06/2022) 14 capsule 0   No facility-administered medications prior to visit.    PAST MEDICAL HISTORY: Past Medical History:  Diagnosis Date   Abnormality of gait 07/28/2013   Chronic kidney disease    Diabetes (HCC)    Diabetic peripheral neuropathy (HCC)    Diabetic retinopathy (HCC)    Difficult intubation    Foot drop, bilateral 07/31/2014   GERD (gastroesophageal reflux disease)    Peripheral edema    Polyneuropathy in diabetes(357.2) 07/28/2013   Sleep apnea    uses cpap   Tongue mass     PAST SURGICAL HISTORY: Past Surgical History:  Procedure Laterality Date   ABDOMINAL HYSTERECTOMY     ANKLE FRACTURE SURGERY     left   BREAST BIOPSY Right 12/03/2021   APOCRINE METAPLASIA   CATARACT EXTRACTION W/PHACO Right 02/21/2014   Dr. Elmer [name]   CATARACT EXTRACTION W/PHACO Left 04/10/2014   Dr. Elmer [name]   CATARACT EXTRACTION, BILATERAL     DIRECT LARYNGOSCOPY Left 08/02/2021   Procedure: DIRECT LARYNGOSCOPY WITH TONGUE BIOPSY;  Surgeon: Grace Pies, MD;  Location: [facility];  Service: ENT;  Laterality: Left;   HIP SURGERY     right   MASS BIOPSY Left 09/06/2021   Procedure: NECK MASS BIOPSY;  Surgeon: Grace Pies, MD;  Location: [facility];  Service: ENT;  Laterality: Left;   TONSILLECTOMY      FAMILY HISTORY: Family History  Problem Relation Age of Onset   Diabetes Mother    Breast cancer Maternal Aunt    Diabetes Brother    Neuropathy Neg Hx     SOCIAL HISTORY: Social History   Socioeconomic History   Marital status: Married    Spouse name: Not on file   Number of children: 2   Years of education: college   Highest education level: Not on file  Occupational History   Occupation: part time   Tobacco Use   Smoking status: Never    Passive exposure: Never   Smokeless tobacco: Never  Vaping Use   Vaping Use: Never used  Substance and Sexual Activity   Alcohol use: No   Drug use: No   Sexual activity: Not Currently  Other Topics Concern   Not on file  Social History Narrative   Patient is right handed.   Patient drinks about 4 cups of tea daily.   Social Determinants of Health   Financial Resource Strain: Not on file  Food Insecurity: No Food Insecurity (07/02/2022)   Hunger Vital Sign    Worried About Running Out of Food in the Last Year: Never true    Ran Out of Food  in the Last Year: Never true  Transportation Needs: No Transportation Needs (07/02/2022)   PRAPARE - Administrator, Civil Service (Medical): No    Lack of Transportation (Non-Medical): No  Physical Activity: Not on file  Stress: Not on file  Social Connections: Not on file  Intimate Partner Violence: Not At Risk (07/02/2022)   Humiliation, Afraid, Rape, and Kick questionnaire    Fear of Current or Ex-Partner: No    Emotionally Abused: No    Physically Abused: No    Sexually Abused: No   PHYSICAL EXAM  Vitals:   01/22/23 0854  BP: 110/64  Weight: 148 lb (67.1 kg)  Height: 5\' 4"  (1.626 m)   Body mass index is 25.4 kg/m.  Generalized: Well developed, in no acute distress   Neurological examination  Mentation: Alert oriented to time, place, history taking. Follows all commands speech and language fluent Cranial nerve II-XII: Pupils were equal round reactive to light. Extraocular movements were full, visual field were full on confrontational test. Facial sensation and strength were normal.  Head turning and shoulder shrug were normal and symmetric. Motor: The motor testing reveals 5 over 5 strength of all 4 extremities. Good symmetric motor tone is noted throughout. Bilateral AFO Sensory: Sensory testing is intact to soft touch on all 4 extremities. No evidence of extinction is  noted.  Coordination: Cerebellar testing reveals good finger-nose-finger bilaterally, hard to perform heel-to-shin due to AFOs Gait and station: Gait is slightly wide-based, is cautious, uses walker in hallway Reflexes: Deep tendon reflexes are symmetric but are depressed throughout  DIAGNOSTIC DATA (LABS, IMAGING, TESTING) - I reviewed patient records, labs, notes, testing and imaging myself where available.  Lab Results  Component Value Date   WBC 9.2 10/09/2022   HGB 11.4 (L) 10/09/2022   HCT 35.2 10/09/2022   MCV 90.7 10/09/2022   PLT 443 (H) 10/09/2022      Component Value Date/Time   NA 139 10/17/2022 1131   K 5.5 (H) 10/17/2022 1131   CL 101 10/17/2022 1131   CO2 29 10/17/2022 1131   GLUCOSE 108 (H) 10/17/2022 1131   BUN 26 (H) 10/17/2022 1131   CREATININE 0.93 10/17/2022 1131   CALCIUM 10.0 10/17/2022 1131   PROT 6.2 10/09/2022 1445   ALBUMIN 2.3 (L) 07/03/2022 0418   AST 10 10/09/2022 1445   ALT 8 10/09/2022 1445   ALKPHOS 57 07/03/2022 0418   BILITOT 0.2 10/09/2022 1445   GFRNONAA 60 (L) 07/03/2022 0418   GFRNONAA 59 (L) 12/05/2020 0951   GFRAA 69 12/05/2020 0951   Lab Results  Component Value Date   CHOL 185 10/09/2022   HDL 66 10/09/2022   LDLCALC 89 10/09/2022   TRIG 201 (H) 10/09/2022   CHOLHDL 2.8 10/09/2022   Lab Results  Component Value Date   HGBA1C 9.4 (H) 10/09/2022   No results found for: "VITAMINB12" Lab Results  Component Value Date   TSH 2.07 06/11/2022   ASSESSMENT AND PLAN 72 y.o. year old female  has a past medical history of Abnormality of gait (07/28/2013), Chronic kidney disease, Diabetes (HCC), Diabetic peripheral neuropathy (HCC), Diabetic retinopathy (HCC), Difficult intubation, Foot drop, bilateral (07/31/2014), GERD (gastroesophageal reflux disease), Peripheral edema, Polyneuropathy in diabetes(357.2) (07/28/2013), Sleep apnea, and Tongue mass. here with:  1.  Diabetic peripheral neuropathy -Recent increase in A1c following  septic illness, control is improving -Cymbalta continues to be quite helpful, will continue 30 mg daily, I refilled today, PCP can refill going forward  2.  OSA on CPAP -She wishes to discontinue, no longer recommended by her retinal specialist after no improvement was seen in her condition nor subjective benefit -I will send an order to her DME to discontinue usage, she has not used in over 1 year -We discussed repeating sleep study to see if any change, she declines for now, understands the risk of untreated sleep apnea -In June 2021 sleep study showed mild sleep apnea with loud snoring, felt this mild sleep apnea should be treated with CPAP given snoring, EDS complaint -Follow-up at our office on an as-needed basis, keep close follow-up with PCP  Margie Ege, AGNP-C, DNP 01/22/2023, 10:02 AM [facility] [address] [place], [place] [address] [phone]

## 2023-01-22 ENCOUNTER — Ambulatory Visit (INDEPENDENT_AMBULATORY_CARE_PROVIDER_SITE_OTHER): Payer: Medicare Other | Admitting: Neurology

## 2023-01-22 ENCOUNTER — Telehealth: Payer: Self-pay

## 2023-01-22 ENCOUNTER — Encounter: Payer: Self-pay | Admitting: Neurology

## 2023-01-22 VITALS — BP 110/64 | Ht 64.0 in | Wt 148.0 lb

## 2023-01-22 DIAGNOSIS — G4733 Obstructive sleep apnea (adult) (pediatric): Secondary | ICD-10-CM | POA: Diagnosis not present

## 2023-01-22 DIAGNOSIS — H35373 Puckering of macula, bilateral: Secondary | ICD-10-CM | POA: Diagnosis not present

## 2023-01-22 DIAGNOSIS — E113411 Type 2 diabetes mellitus with severe nonproliferative diabetic retinopathy with macular edema, right eye: Secondary | ICD-10-CM | POA: Diagnosis not present

## 2023-01-22 DIAGNOSIS — Z7984 Long term (current) use of oral hypoglycemic drugs: Secondary | ICD-10-CM | POA: Diagnosis not present

## 2023-01-22 DIAGNOSIS — E1142 Type 2 diabetes mellitus with diabetic polyneuropathy: Secondary | ICD-10-CM | POA: Diagnosis not present

## 2023-01-22 DIAGNOSIS — E113412 Type 2 diabetes mellitus with severe nonproliferative diabetic retinopathy with macular edema, left eye: Secondary | ICD-10-CM | POA: Diagnosis not present

## 2023-01-22 LAB — HM DIABETES EYE EXAM

## 2023-01-22 MED ORDER — DULOXETINE HCL 30 MG PO CPEP: 30.0000 mg | ORAL_CAPSULE | Freq: Every day | ORAL | 3 refills | Status: AC

## 2023-01-22 NOTE — Telephone Encounter (Signed)
-----   Message from Glean Salvo, NP sent at 01/22/2023 10:06 AM EDT ----- Please send order to DME to discontinue CPAP.  Thanks, Maralyn Sago

## 2023-01-22 NOTE — Telephone Encounter (Signed)
Order sent to [facility] through [facility] to discontinue cpap.

## 2023-01-26 ENCOUNTER — Encounter: Payer: Self-pay | Admitting: Internal Medicine

## 2023-01-27 NOTE — Progress Notes (Unsigned)
3 MONTH  FOLLOW UP  Assessment:   Diabetic polyneuropathy associated with diabetes mellitus due to underlying condition (HCC) Continue follow up  Type 2 diabetes mellitus with stage 2 chronic kidney disease, without long-term current use of insulin (HCC) Increase fluids, avoid NSAIDS, monitor sugars, will monitor Discussed trulicity to help lower A1c ,reluctant to use injections Continue Metformin 500 mg 2 tabs BID and glimiperide 2 mg daily with breakfast Start glimepiride 2 mg at supper if blood sugar is greater than 150 Strongly encouraged to check blood sugar fasting and prior to dinner and keep BS log and bring to next visit - A1c  Type 2 diabetes mellitus with hyperlipidemia (HCC) STRONGLY suggest getting on low dose statin, patient declines at this time, will consider.  decrease fatty foods increase activity.  - lipid panel  Current moderate episode of depression(HCC) Suggested therapy and possible adjustment of medication, currently on Cymbalta for pain- pt refuses at this time.  Will try to increase exercise Instructed patient to contact office or on-call physician promptly should condition worsen or any new symptoms appear. IF THE PATIENT HAS ANY SUICIDAL OR HOMICIDAL IDEATIONS, CALL THE OFFICE, DISCUSS WITH A SUPPORT MEMBER, OR GO TO THE ER IMMEDIATELY. Patient was agreeable with this plan.   Diabetes mellitus type 2 with peripheral artery disease (HCC) - Had recent ABI at [facility] - Continue to follow with vascular and vein  Diabetes mellitus with macular edema (HCC) - continue follow up every 6 weeks.   Mixed hyperlipidemia Associated with Type 2 Diabetes Mellitus(HCC) -     Continue diet and exercise - Lipid panel  Osteoporosis Last DEXA was 2020 T -3.2 Declines repeat DEXA at this time  Essential hypertension - continue medications, DASH diet, exercise and monitor at home. Call if greater than 130/80. - CBC, CMP  Foot drop, bilateral( right is  greater than left) Has leg braces and follows with ortho  Vitamin D deficiency Continue Vit D supplementation   Fatty liver Check labs, avoid tylenol, alcohol, weight loss advised.   OSA on CPAP She has not been using.  Dr. Luciana Axe has agreed to be off last summer and eyes were same or better so does not use.    Medication management -     CBC with Differential/Platelet -     COMPLETE METABOLIC PANEL WITH GFR -     Lipid panel -     TSH -     VITAMIN D 25 Hydroxy (Vit-D Deficiency, Fractures) -     Magnesium  Dysuria Will await urine results for treatment, unable to use Macrobid due to side effect of severe diarrhea -     Urinalysis, Routine w reflex microscopic -     Urine Culture      Over 30 minutes of exam, counseling, chart review, and critical decision making was performed  Future Appointments  Date Time Provider Department Center  04/02/2023  9:45 AM Vivi Barrack, DPM [facility]  06/12/2023 10:00 AM Raynelle Dick, NP [facility] None  10/02/2023 11:00 AM Raynelle Dick, NP [facility] None      Subjective:   Grace [name] is a 72 y.o. female who presents for Medicare Annual Wellness Visit and 3 month follow up on hypertension, diabetes, hyperlipidemia, vitamin D def.   She has been having urinary urgency, frequency and dysuria x last 3 days  She has elevated Calcium with last check and has fluctuated between high and low in the past- last PTH 09/2022  normal Lab Results  Component Value Date   PTH 43 10/09/2022   CALCIUM 10.0 10/17/2022   CAION 1.16 07/01/2022       She does follow with vascular and vein specialists due to PAD. Last visit was 10/06/22  Neuropathy persists and has numbness and tingling up to knees bilaterally. She does wear brace on both feet/ankles.  No falls, using walker consistently  BMI is Body mass index is 25.68 kg/m., she is working on diet and exercise.   Wt Readings from Last 3 Encounters:  01/28/23 149  lb 9.6 oz (67.9 kg)  01/22/23 148 lb (67.1 kg)  10/17/22 145 lb 12.8 oz (66.1 kg)    Her blood pressure is not checked at home, today their BP is BP: 130/70  BP Readings from Last 3 Encounters:  01/28/23 130/70  01/22/23 110/64  10/17/22 138/72  She does not workout. She denies chest pain, shortness of breath, dizziness.   She does have a history of diabetes managed with medication, diet, and exercise.  With PAD had ABI in 2015 that showed mild disease, repeated 08/19/22 showing normal on right, unable to accurately calculate on left not on ASA due to Mac Degen She has CKD stage 2 She has neuropathy which is extensive and requires her to use a walker to avoid falls, she is on cymbalta for neuropathy. She has braces on her legs to help prevent falls, no falls in past year but she is high risk. Does have foot drop bilaterally With hyperlipidemia not at goal- long discussion about statins, will check today and see where we are at She has macular degeneration and edema following with Dr. Luciana Axe, not on HCTZ due to this She is getting fasting BS 95-130, 4 pm sugars running 70's- 200 was told to stop Glipizide 2 mg once a day with breakfast Has accucheck she is on 4 metformin a day   no low blood sugar.    Lab Results  Component Value Date   HGBA1C 9.4 (H) 10/09/2022   Last GFR Lab Results  Component Value Date   EGFR 65 10/17/2022   Cholesterol is not at goal of < 70 Due to Diabetes. She has declined to take any statin medication. Lab Results  Component Value Date   CHOL 185 10/09/2022   HDL 66 10/09/2022   LDLCALC 89 10/09/2022   TRIG 201 (H) 10/09/2022   CHOLHDL 2.8 10/09/2022    Patient is on Vitamin D supplement. Lab Results  Component Value Date   VD25OH 76 06/11/2022      Medication Review  Current Outpatient Medications (Endocrine & Metabolic):    metFORMIN (GLUCOPHAGE-XR) 500 MG 24 hr tablet, TAKE 2 TABLETS BY MOUTH TWICE DAILY FOR DIABETES    Current  Outpatient Medications (Analgesics):    acetaminophen (TYLENOL) 500 MG tablet, Take 1,000 mg by mouth every 8 (eight) hours as needed for moderate pain.   Current Outpatient Medications (Other):    blood glucose meter kit and supplies KIT, Dispense based on patient and insurance preference. Use 3 times daily as directed.   Cholecalciferol (DIALYVITE VITAMIN D 5000) 125 MCG (5000 UT) capsule, Take 5,000 Units by mouth daily.   DULoxetine (CYMBALTA) 30 MG capsule, Take 1 capsule (30 mg total) by mouth daily.   glucose blood (ACCU-CHEK GUIDE) test strip, TEST ONCE DAILY AS DIRECTED   Magnesium 250 MG TABS, Take 500 mg by mouth daily.   Misc Natural Products (YUMVS BEET ROOT-TART CHERRY PO), Take 1 tablet by mouth  daily.   zinc gluconate 50 MG tablet, Take 50 mg by mouth daily.   estradiol (ESTRACE) 0.1 MG/GM vaginal cream, Place vaginally. (Patient not taking: Reported on 01/22/2023)   nitrofurantoin, macrocrystal-monohydrate, (MACROBID) 100 MG capsule, Take  1 capsule  2 x /day with for for 1 week gfor UTI. (Patient not taking: Reported on 10/06/2022)  Allergies: Allergies  Allergen Reactions   Gabapentin Swelling    Swelling in legs and feet    Current Problems (verified) has Diabetic polyneuropathy (HCC); Foot drop, bilateral; T2_NIDDM; Vitamin D deficiency; Fatty liver; Cholelithiases; Hyperlipidemia associated with type 2 diabetes mellitus (HCC); Diabetes mellitus type 2 with peripheral artery disease (HCC); Diabetic macular edema of right eye with proliferative retinopathy associated with type 2 diabetes mellitus (HCC); Proliferative diabetic retinopathy of left eye with macular edema associated with type 2 diabetes mellitus (HCC); Right epiretinal membrane; Non-restorative sleep; Snoring; RLS (restless legs syndrome); OSA on CPAP; CKD (chronic kidney disease) stage 2, GFR 60-89 ml/min; Osteoporosis; Lattice corneal dystrophy of both eyes; Epiretinal membrane, left eye; Cellulitis; Acute  encephalopathy; and UTI (urinary tract infection) on their problem list.   Names of Other Physician/Practitioners you currently use: 1.  [facility]- here for primary care 2.  Dr. Luciana Axe 08/14/22 Dr. Judy Pimple 2024 Patient Care Team: Lucky Cowboy, MD as PCP - General (Internal Medicine) Luciana Axe Alford Highland, MD as Consulting Physician (Ophthalmology) York Spaniel, MD (Inactive) as Consulting Physician (Neurology) Mateo Flow, MD as Consulting Physician (Ophthalmology) Henreitta Leber, PA-C as Physician Assistant (Obstetrics and Gynecology)  Surgical: She  has a past surgical history that includes Tonsillectomy; Abdominal hysterectomy; Hip surgery; Ankle fracture surgery; Cataract extraction, bilateral; Cataract extraction w/PHACO (Right, 02/21/2014); Cataract extraction w/PHACO (Left, 04/10/2014); Direct laryngoscopy (Left, 08/02/2021); Mass biopsy (Left, 09/06/2021); and Breast biopsy (Right, 12/03/2021). Family Her family history includes Breast cancer in her maternal aunt; Diabetes in her brother and mother. Social history  She reports that she has never smoked. She has never been exposed to tobacco smoke. She has never used smokeless tobacco. She reports that she does not drink alcohol and does not use drugs.      Objective:   Today's Vitals   01/28/23 0948  BP: 130/70  Pulse: 69  Resp: 17  Temp: 97.9 F (36.6 C)  SpO2: 99%  Weight: 149 lb 9.6 oz (67.9 kg)  Height: 5\' 4"  (1.626 m)      Body mass index is 25.68 kg/m.  General Appearance: Well nourished, in no apparent distress.  Eyes: PERRLA, EOMs, conjunctiva no swelling or erythema, normal fundi and vessels.  Sinuses: No Frontal/maxillary tenderness  ENT/Mouth: Ext aud canals clear, normal light reflex with TMs without erythema, bulging. Good dentition. No erythema, swelling, or exudate on post pharynx. Tonsils not swollen or erythematous. Hearing normal.  Neck: Supple, thyroid  normal. No bruits Swelling left neck, possible lymph node Respiratory: Respiratory effort normal, BS equal bilaterally without rales, rhonchi, wheezing or stridor.  Cardio: RRR without murmurs, rubs or gallops. DP and PT slighlty diminished Chest: symmetric, with normal excursions and percussion.  Abdomen: Positive bowel sounds all 4 quadrants, Soft, nontender, no guarding, rebound, hernias, masses, or organomegaly.  Musculoskeletal: Decreased ROM of lower legs/feet bilaterally. No CVA tenderness Skin: Feet and purple discoloration and cool to touch, Neuro: Cranial nerves intact, reflexes equal bilaterally. Normal muscle tone, no cerebellar symptoms. Sensation diminished with monofilament to lower extremities, PT and DP slightly diminished Psych: Awake and oriented X 3, normal affect, Insight and Judgment appropriate.  Raynelle Dick, NP   01/28/2023

## 2023-01-28 ENCOUNTER — Encounter: Payer: Self-pay | Admitting: Nurse Practitioner

## 2023-01-28 ENCOUNTER — Ambulatory Visit (INDEPENDENT_AMBULATORY_CARE_PROVIDER_SITE_OTHER): Payer: Medicare Other | Admitting: Nurse Practitioner

## 2023-01-28 VITALS — BP 130/70 | HR 69 | Temp 97.9°F | Resp 17 | Ht 64.0 in | Wt 149.6 lb

## 2023-01-28 DIAGNOSIS — E0842 Diabetes mellitus due to underlying condition with diabetic polyneuropathy: Secondary | ICD-10-CM

## 2023-01-28 DIAGNOSIS — M810 Age-related osteoporosis without current pathological fracture: Secondary | ICD-10-CM | POA: Diagnosis not present

## 2023-01-28 DIAGNOSIS — Z79899 Other long term (current) drug therapy: Secondary | ICD-10-CM

## 2023-01-28 DIAGNOSIS — E785 Hyperlipidemia, unspecified: Secondary | ICD-10-CM

## 2023-01-28 DIAGNOSIS — E1169 Type 2 diabetes mellitus with other specified complication: Secondary | ICD-10-CM | POA: Diagnosis not present

## 2023-01-28 DIAGNOSIS — R300 Dysuria: Secondary | ICD-10-CM

## 2023-01-28 DIAGNOSIS — I10 Essential (primary) hypertension: Secondary | ICD-10-CM

## 2023-01-28 DIAGNOSIS — N182 Chronic kidney disease, stage 2 (mild): Secondary | ICD-10-CM | POA: Diagnosis not present

## 2023-01-28 DIAGNOSIS — E1151 Type 2 diabetes mellitus with diabetic peripheral angiopathy without gangrene: Secondary | ICD-10-CM

## 2023-01-28 DIAGNOSIS — E11311 Type 2 diabetes mellitus with unspecified diabetic retinopathy with macular edema: Secondary | ICD-10-CM | POA: Diagnosis not present

## 2023-01-28 DIAGNOSIS — K760 Fatty (change of) liver, not elsewhere classified: Secondary | ICD-10-CM

## 2023-01-28 DIAGNOSIS — G4733 Obstructive sleep apnea (adult) (pediatric): Secondary | ICD-10-CM | POA: Diagnosis not present

## 2023-01-28 DIAGNOSIS — E559 Vitamin D deficiency, unspecified: Secondary | ICD-10-CM | POA: Diagnosis not present

## 2023-01-28 DIAGNOSIS — E1122 Type 2 diabetes mellitus with diabetic chronic kidney disease: Secondary | ICD-10-CM

## 2023-01-28 DIAGNOSIS — F321 Major depressive disorder, single episode, moderate: Secondary | ICD-10-CM

## 2023-01-28 MED ORDER — ACCU-CHEK GUIDE VI STRP
ORAL_STRIP | 3 refills | Status: DC
Start: 2023-01-28 — End: 2023-05-11

## 2023-01-28 MED ORDER — GLIMEPIRIDE 2 MG PO TABS: ORAL_TABLET | ORAL | 3 refills | Status: AC

## 2023-01-28 NOTE — Patient Instructions (Addendum)
Continue metformin 500 mg 2 tab BID  Continue glimepiride 2 mg every morning and if 4 PM blood sugar is greater than 150 take another at supper  Continue to check blood sugars twice and day and keep a log  Diabetes Mellitus and Nutrition, Adult When you have diabetes, or diabetes mellitus, it is very important to have healthy eating habits because your blood sugar (glucose) levels are greatly affected by what you eat and drink. Eating healthy foods in the right amounts, at about the same times every day, can help you: Manage your blood glucose. Lower your risk of heart disease. Improve your blood pressure. Reach or maintain a healthy weight. What can affect my meal plan? Every person with diabetes is different, and each person has different needs for a meal plan. Your health care provider may recommend that you work with a dietitian to make a meal plan that is best for you. Your meal plan may vary depending on factors such as: The calories you need. The medicines you take. Your weight. Your blood glucose, blood pressure, and cholesterol levels. Your activity level. Other health conditions you have, such as heart or kidney disease. How do carbohydrates affect me? Carbohydrates, also called carbs, affect your blood glucose level more than any other type of food. Eating carbs raises the amount of glucose in your blood. It is important to know how many carbs you can safely have in each meal. This is different for every person. Your dietitian can help you calculate how many carbs you should have at each meal and for each snack. How does alcohol affect me? Alcohol can cause a decrease in blood glucose (hypoglycemia), especially if you use insulin or take certain diabetes medicines by mouth. Hypoglycemia can be a life-threatening condition. Symptoms of hypoglycemia, such as sleepiness, dizziness, and confusion, are similar to symptoms of having too much alcohol. Do not drink alcohol if: Your  health care provider tells you not to drink. You are pregnant, may be pregnant, or are planning to become pregnant. If you drink alcohol: Limit how much you have to: 0-1 drink a day for women. 0-2 drinks a day for men. Know how much alcohol is in your drink. In the [place] one drink equals one 12 oz bottle of beer (355 mL), one 5 oz glass of wine (148 mL), or one 1 oz glass of hard liquor (44 mL). Keep yourself hydrated with water, diet soda, or unsweetened iced tea. Keep in mind that regular soda, juice, and other mixers may contain a lot of sugar and must be counted as carbs. What are tips for following this plan?  Reading food labels Start by checking the serving size on the Nutrition Facts label of packaged foods and drinks. The number of calories and the amount of carbs, fats, and other nutrients listed on the label are based on one serving of the item. Many items contain more than one serving per package. Check the total grams (g) of carbs in one serving. Check the number of grams of saturated fats and trans fats in one serving. Choose foods that have a low amount or none of these fats. Check the number of milligrams (mg) of salt (sodium) in one serving. Most people should limit total sodium intake to less than 2,300 mg per day. Always check the nutrition information of foods labeled as "low-fat" or "nonfat." These foods may be higher in added sugar or refined carbs and should be avoided. Talk to your dietitian to identify  your daily goals for nutrients listed on the label. Shopping Avoid buying canned, pre-made, or processed foods. These foods tend to be high in fat, sodium, and added sugar. Shop around the outside edge of the grocery store. This is where you will most often find fresh fruits and vegetables, bulk grains, fresh meats, and fresh dairy products. Cooking Use low-heat cooking methods, such as baking, instead of high-heat cooking methods, such as deep frying. Cook using healthy  oils, such as olive, canola, or sunflower oil. Avoid cooking with butter, cream, or high-fat meats. Meal planning Eat meals and snacks regularly, preferably at the same times every day. Avoid going long periods of time without eating. Eat foods that are high in fiber, such as fresh fruits, vegetables, beans, and whole grains. Eat 4-6 oz (112-168 g) of lean protein each day, such as lean meat, chicken, fish, eggs, or tofu. One ounce (oz) (28 g) of lean protein is equal to: 1 oz (28 g) of meat, chicken, or fish. 1 egg.  cup (62 g) of tofu. Eat some foods each day that contain healthy fats, such as avocado, nuts, seeds, and fish. What foods should I eat? Fruits Berries. Apples. Oranges. Peaches. Apricots. Plums. Grapes. Mangoes. Papayas. Pomegranates. Kiwi. Cherries. Vegetables Leafy greens, including lettuce, spinach, kale, chard, collard greens, mustard greens, and cabbage. Beets. Cauliflower. Broccoli. Carrots. Green beans. Tomatoes. Peppers. Onions. Cucumbers. Brussels sprouts. Grains Whole grains, such as whole-wheat or whole-grain bread, crackers, tortillas, cereal, and pasta. Unsweetened oatmeal. Quinoa. Brown or wild rice. Meats and other proteins Seafood. Poultry without skin. Lean cuts of poultry and beef. Tofu. Nuts. Seeds. Dairy Low-fat or fat-free dairy products such as milk, yogurt, and cheese. The items listed above may not be a complete list of foods and beverages you can eat and drink. Contact a dietitian for more information. What foods should I avoid? Fruits Fruits canned with syrup. Vegetables Canned vegetables. Frozen vegetables with butter or cream sauce. Grains Refined white flour and flour products such as bread, pasta, snack foods, and cereals. Avoid all processed foods. Meats and other proteins Fatty cuts of meat. Poultry with skin. Breaded or fried meats. Processed meat. Avoid saturated fats. Dairy Full-fat yogurt, cheese, or milk. Beverages Sweetened  drinks, such as soda or iced tea. The items listed above may not be a complete list of foods and beverages you should avoid. Contact a dietitian for more information. Questions to ask a health care provider Do I need to meet with a certified diabetes care and education specialist? Do I need to meet with a dietitian? What number can I call if I have questions? When are the best times to check my blood glucose? Where to find more information: American Diabetes Association: diabetes.org Academy of Nutrition and Dietetics: eatright[employer] of Diabetes and Digestive and Kidney Diseases: StageSync.si Association of Diabetes Care & Education Specialists: diabeteseducator.org Summary It is important to have healthy eating habits because your blood sugar (glucose) levels are greatly affected by what you eat and drink. It is important to use alcohol carefully. A healthy meal plan will help you manage your blood glucose and lower your risk of heart disease. Your health care provider may recommend that you work with a dietitian to make a meal plan that is best for you. This information is not intended to replace advice given to you by your health care provider. Make sure you discuss any questions you have with your health care provider. Document Revised: 03/07/2020 Document Reviewed: 03/07/2020 Elsevier  Patient Education  2024 [employer].

## 2023-01-29 ENCOUNTER — Other Ambulatory Visit: Payer: Self-pay | Admitting: Nurse Practitioner

## 2023-01-29 DIAGNOSIS — E1122 Type 2 diabetes mellitus with diabetic chronic kidney disease: Secondary | ICD-10-CM

## 2023-01-29 DIAGNOSIS — E875 Hyperkalemia: Secondary | ICD-10-CM

## 2023-01-29 DIAGNOSIS — R300 Dysuria: Secondary | ICD-10-CM | POA: Diagnosis not present

## 2023-01-29 LAB — VITAMIN D 25 HYDROXY (VIT D DEFICIENCY, FRACTURES): Vit D, 25-Hydroxy: 86 ng/mL (ref 30–100)

## 2023-01-29 LAB — COMPLETE METABOLIC PANEL WITH GFR
Albumin: 4.1 g/dL (ref 3.6–5.1)
Alkaline phosphatase (APISO): 50 U/L (ref 37–153)
CO2: 31 mmol/L (ref 20–32)
Calcium: 9.9 mg/dL (ref 8.6–10.4)
Chloride: 104 mmol/L (ref 98–110)
Creat: 1.12 mg/dL — ABNORMAL HIGH (ref 0.60–1.00)
Potassium: 6 mmol/L — ABNORMAL HIGH (ref 3.5–5.3)
Sodium: 144 mmol/L (ref 135–146)

## 2023-01-29 LAB — LIPID PANEL
Non-HDL Cholesterol (Calc): 119 mg/dL (calc) (ref <130)
Total CHOL/HDL Ratio: 2.8 (calc) (ref <5.0)
Triglycerides: 153 mg/dL — ABNORMAL HIGH (ref <150)

## 2023-01-29 LAB — TEST AUTHORIZATION

## 2023-01-29 LAB — CBC WITH DIFFERENTIAL/PLATELET
Eosinophils Absolute: 281 cells/uL (ref 15–500)
Eosinophils Relative: 3.9 %
Hemoglobin: 11.4 g/dL — ABNORMAL LOW (ref 11.7–15.5)
Lymphs Abs: 2066 cells/uL (ref 850–3900)
MCH: 29.8 pg (ref 27.0–33.0)
MCHC: 32.6 g/dL (ref 32.0–36.0)
Neutro Abs: 4234 cells/uL (ref 1500–7800)
Platelets: 410 10*3/uL — ABNORMAL HIGH (ref 140–400)

## 2023-01-29 LAB — TSH: TSH: 1.49 mIU/L (ref 0.40–4.50)

## 2023-01-29 NOTE — Progress Notes (Signed)
Patient is aware of lab results and instructions and NV to re-check her potassium and A1c has been scheduled for Monday (02/02/23). Grace [name]

## 2023-01-30 ENCOUNTER — Other Ambulatory Visit: Payer: Self-pay | Admitting: Nurse Practitioner

## 2023-01-30 DIAGNOSIS — N3000 Acute cystitis without hematuria: Secondary | ICD-10-CM

## 2023-01-30 LAB — COMPLETE METABOLIC PANEL WITH GFR
AG Ratio: 2 (calc) (ref 1.0–2.5)
ALT: 9 U/L (ref 6–29)
AST: 12 U/L (ref 10–35)
BUN/Creatinine Ratio: 27 (calc) — ABNORMAL HIGH (ref 6–22)
BUN: 30 mg/dL — ABNORMAL HIGH (ref 7–25)
Globulin: 2.1 g/dL (calc) (ref 1.9–3.7)
Glucose, Bld: 110 mg/dL — ABNORMAL HIGH (ref 65–99)
Total Bilirubin: 0.3 mg/dL (ref 0.2–1.2)
Total Protein: 6.2 g/dL (ref 6.1–8.1)
eGFR: 52 mL/min/{1.73_m2} — ABNORMAL LOW (ref >=60)

## 2023-01-30 LAB — CBC WITH DIFFERENTIAL/PLATELET
Absolute Monocytes: 562 cells/uL (ref 200–950)
Basophils Absolute: 58 cells/uL (ref 0–200)
Basophils Relative: 0.8 %
HCT: 35 % (ref 35.0–45.0)
MCV: 91.6 fL (ref 80.0–100.0)
MPV: 9.4 fL (ref 7.5–12.5)
Monocytes Relative: 7.8 %
Neutrophils Relative %: 58.8 %
RBC: 3.82 10*6/uL (ref 3.80–5.10)
RDW: 13.3 % (ref 11.0–15.0)
Total Lymphocyte: 28.7 %
WBC: 7.2 10*3/uL (ref 3.8–10.8)

## 2023-01-30 LAB — URINALYSIS, ROUTINE W REFLEX MICROSCOPIC
Ketones, ur: NEGATIVE
Nitrite: POSITIVE — AB
Specific Gravity, Urine: 1.013 (ref 1.001–1.035)
pH: 6 (ref 5.0–8.0)

## 2023-01-30 LAB — TEST AUTHORIZATION

## 2023-01-30 LAB — HEMOGLOBIN A1C
Hgb A1c MFr Bld: 6.7 % of total Hgb — ABNORMAL HIGH (ref <5.7)
Mean Plasma Glucose: 146 mg/dL
eAG (mmol/L): 8.1 mmol/L

## 2023-01-30 LAB — LIPID PANEL
Cholesterol: 187 mg/dL (ref <200)
HDL: 68 mg/dL (ref >=50)
LDL Cholesterol (Calc): 94 mg/dL (calc)

## 2023-01-30 LAB — MICROSCOPIC MESSAGE

## 2023-01-30 LAB — MAGNESIUM: Magnesium: 1.4 mg/dL — ABNORMAL LOW (ref 1.5–2.5)

## 2023-01-30 MED ORDER — AMOXICILLIN 500 MG PO TABS
500.0000 mg | ORAL_TABLET | Freq: Three times a day (TID) | ORAL | 0 refills | Status: DC
Start: 2023-01-30 — End: 2023-06-12

## 2023-01-30 NOTE — Progress Notes (Signed)
Left detailed message on patients voicemail due to closing early today (Friday) and Amoxicillin needs to be started. Told patient to call the office back on Monday if she has any questions. -e welch

## 2023-01-31 LAB — URINE CULTURE
MICRO NUMBER:: 15080618
SPECIMEN QUALITY:: ADEQUATE

## 2023-01-31 LAB — URINALYSIS, ROUTINE W REFLEX MICROSCOPIC
Bilirubin Urine: NEGATIVE
Glucose, UA: NEGATIVE
Hgb urine dipstick: NEGATIVE
Hyaline Cast: NONE SEEN /LPF
Protein, ur: NEGATIVE
RBC / HPF: NONE SEEN /HPF (ref 0–2)
WBC, UA: >=60 /HPF — AB (ref 0–5)

## 2023-02-01 ENCOUNTER — Other Ambulatory Visit: Payer: Self-pay | Admitting: Nurse Practitioner

## 2023-02-01 DIAGNOSIS — N3000 Acute cystitis without hematuria: Secondary | ICD-10-CM

## 2023-02-01 MED ORDER — SULFAMETHOXAZOLE-TRIMETHOPRIM 800-160 MG PO TABS
1.0000 | ORAL_TABLET | Freq: Two times a day (BID) | ORAL | 0 refills | Status: DC
Start: 2023-02-01 — End: 2023-06-12

## 2023-02-02 ENCOUNTER — Ambulatory Visit (INDEPENDENT_AMBULATORY_CARE_PROVIDER_SITE_OTHER): Payer: Medicare Other

## 2023-02-02 DIAGNOSIS — E875 Hyperkalemia: Secondary | ICD-10-CM | POA: Diagnosis not present

## 2023-02-02 LAB — BASIC METABOLIC PANEL WITH GFR
BUN: 22 mg/dL (ref 7–25)
CO2: 33 mmol/L — ABNORMAL HIGH (ref 20–32)
Calcium: 10.1 mg/dL (ref 8.6–10.4)
Chloride: 104 mmol/L (ref 98–110)
Creat: 0.87 mg/dL (ref 0.60–1.00)
Glucose, Bld: 100 mg/dL — ABNORMAL HIGH (ref 65–99)
Potassium: 5.8 mmol/L — ABNORMAL HIGH (ref 3.5–5.3)
Sodium: 143 mmol/L (ref 135–146)
eGFR: 71 mL/min/{1.73_m2} (ref >=60)

## 2023-02-02 NOTE — Progress Notes (Signed)
The patient came in for repeat lab work. She voiced no new concerns today.

## 2023-02-05 ENCOUNTER — Other Ambulatory Visit: Payer: Self-pay | Admitting: Podiatry

## 2023-02-12 DIAGNOSIS — E113412 Type 2 diabetes mellitus with severe nonproliferative diabetic retinopathy with macular edema, left eye: Secondary | ICD-10-CM | POA: Diagnosis not present

## 2023-02-12 DIAGNOSIS — E113411 Type 2 diabetes mellitus with severe nonproliferative diabetic retinopathy with macular edema, right eye: Secondary | ICD-10-CM | POA: Diagnosis not present

## 2023-02-12 DIAGNOSIS — H35372 Puckering of macula, left eye: Secondary | ICD-10-CM | POA: Diagnosis not present

## 2023-02-12 LAB — HM DIABETES EYE EXAM

## 2023-02-25 ENCOUNTER — Encounter: Payer: Self-pay | Admitting: Internal Medicine

## 2023-03-02 DIAGNOSIS — M6289 Other specified disorders of muscle: Secondary | ICD-10-CM | POA: Diagnosis not present

## 2023-03-02 DIAGNOSIS — M6281 Muscle weakness (generalized): Secondary | ICD-10-CM | POA: Diagnosis not present

## 2023-03-02 DIAGNOSIS — M62838 Other muscle spasm: Secondary | ICD-10-CM | POA: Diagnosis not present

## 2023-03-02 DIAGNOSIS — K5900 Constipation, unspecified: Secondary | ICD-10-CM | POA: Diagnosis not present

## 2023-03-02 DIAGNOSIS — N8111 Cystocele, midline: Secondary | ICD-10-CM | POA: Diagnosis not present

## 2023-03-19 DIAGNOSIS — E113411 Type 2 diabetes mellitus with severe nonproliferative diabetic retinopathy with macular edema, right eye: Secondary | ICD-10-CM | POA: Diagnosis not present

## 2023-03-19 DIAGNOSIS — E113412 Type 2 diabetes mellitus with severe nonproliferative diabetic retinopathy with macular edema, left eye: Secondary | ICD-10-CM | POA: Diagnosis not present

## 2023-03-19 DIAGNOSIS — H35373 Puckering of macula, bilateral: Secondary | ICD-10-CM | POA: Diagnosis not present

## 2023-03-19 LAB — HM DIABETES EYE EXAM

## 2023-03-24 ENCOUNTER — Encounter: Payer: Self-pay | Admitting: Internal Medicine

## 2023-03-31 ENCOUNTER — Ambulatory Visit: Payer: Medicare Other | Admitting: Podiatry

## 2023-04-02 ENCOUNTER — Ambulatory Visit (INDEPENDENT_AMBULATORY_CARE_PROVIDER_SITE_OTHER): Payer: Medicare Other | Admitting: Podiatry

## 2023-04-02 DIAGNOSIS — L84 Corns and callosities: Secondary | ICD-10-CM | POA: Diagnosis not present

## 2023-04-02 DIAGNOSIS — M79675 Pain in left toe(s): Secondary | ICD-10-CM

## 2023-04-02 DIAGNOSIS — M79674 Pain in right toe(s): Secondary | ICD-10-CM | POA: Diagnosis not present

## 2023-04-02 DIAGNOSIS — B351 Tinea unguium: Secondary | ICD-10-CM | POA: Diagnosis not present

## 2023-04-02 DIAGNOSIS — E1151 Type 2 diabetes mellitus with diabetic peripheral angiopathy without gangrene: Secondary | ICD-10-CM | POA: Diagnosis not present

## 2023-04-02 NOTE — Progress Notes (Signed)
Subjective: Chief Complaint  Patient presents with   Nail Problem    Nail trim     72 year old female presents the office today for concerns of thick, discolored toenails that she cannot trim herself.  She has no other concerns today. No open lesions.   Objective: AAO x3, NAD DP/PT pulses 1/4 bilaterally, CRT less than 3 seconds Sensation decreased  Chronic discoloration of the left forefoot. Feet are of equal temperature.  Callus left submetatarsal 1 and plantar hallux and 2nd digit without skin breakdown.  Nails are hypertrophic, dystrophic, brittle, discolored, elongated 10. No surrounding redness or drainage. Tenderness nails 1-5 bilaterally. No open lesions or pre-ulcerative lesions are identified today. No pain with calf compression, swelling, warmth, erythema  Assessment: Symptomatic onychomycosis; PAD  Plan: -All treatment options discussed with the patient including all alternatives, risks, complications.  -She will be debrided nails x 10 are any complications or bleeding.   For now we will continue with routine debridement.  Previously was prescribed Penlac but she was told it would not be helpful since she has not been using it. -Separate debrided hyperkeratotic lesions x 3 without complications or bleeding. -Daily foot inspection  -Patient encouraged to call the office with any questions, concerns, change in symptoms.   Vivi Barrack DPM

## 2023-04-03 ENCOUNTER — Other Ambulatory Visit: Payer: Self-pay | Admitting: Nurse Practitioner

## 2023-04-09 DIAGNOSIS — H35372 Puckering of macula, left eye: Secondary | ICD-10-CM | POA: Diagnosis not present

## 2023-04-09 DIAGNOSIS — E113412 Type 2 diabetes mellitus with severe nonproliferative diabetic retinopathy with macular edema, left eye: Secondary | ICD-10-CM | POA: Diagnosis not present

## 2023-04-09 DIAGNOSIS — E113411 Type 2 diabetes mellitus with severe nonproliferative diabetic retinopathy with macular edema, right eye: Secondary | ICD-10-CM | POA: Diagnosis not present

## 2023-04-09 DIAGNOSIS — H35371 Puckering of macula, right eye: Secondary | ICD-10-CM | POA: Diagnosis not present

## 2023-04-29 DIAGNOSIS — M62838 Other muscle spasm: Secondary | ICD-10-CM | POA: Diagnosis not present

## 2023-04-29 DIAGNOSIS — K5900 Constipation, unspecified: Secondary | ICD-10-CM | POA: Diagnosis not present

## 2023-04-29 DIAGNOSIS — M6289 Other specified disorders of muscle: Secondary | ICD-10-CM | POA: Diagnosis not present

## 2023-04-29 DIAGNOSIS — N3942 Incontinence without sensory awareness: Secondary | ICD-10-CM | POA: Diagnosis not present

## 2023-04-29 DIAGNOSIS — M6281 Muscle weakness (generalized): Secondary | ICD-10-CM | POA: Diagnosis not present

## 2023-05-10 ENCOUNTER — Other Ambulatory Visit: Payer: Self-pay | Admitting: Nurse Practitioner

## 2023-05-11 ENCOUNTER — Other Ambulatory Visit: Payer: Self-pay | Admitting: Nurse Practitioner

## 2023-05-11 DIAGNOSIS — E1122 Type 2 diabetes mellitus with diabetic chronic kidney disease: Secondary | ICD-10-CM

## 2023-05-11 MED ORDER — ACCU-CHEK GUIDE VI STRP: ORAL_STRIP | 3 refills | Status: DC

## 2023-05-13 ENCOUNTER — Other Ambulatory Visit: Payer: Self-pay | Admitting: Nurse Practitioner

## 2023-05-13 DIAGNOSIS — E1122 Type 2 diabetes mellitus with diabetic chronic kidney disease: Secondary | ICD-10-CM

## 2023-05-14 ENCOUNTER — Other Ambulatory Visit: Payer: Self-pay

## 2023-05-14 DIAGNOSIS — E1122 Type 2 diabetes mellitus with diabetic chronic kidney disease: Secondary | ICD-10-CM

## 2023-05-14 DIAGNOSIS — E113412 Type 2 diabetes mellitus with severe nonproliferative diabetic retinopathy with macular edema, left eye: Secondary | ICD-10-CM | POA: Diagnosis not present

## 2023-05-14 DIAGNOSIS — H35373 Puckering of macula, bilateral: Secondary | ICD-10-CM | POA: Diagnosis not present

## 2023-05-14 DIAGNOSIS — E113411 Type 2 diabetes mellitus with severe nonproliferative diabetic retinopathy with macular edema, right eye: Secondary | ICD-10-CM | POA: Diagnosis not present

## 2023-05-14 MED ORDER — ACCU-CHEK GUIDE VI STRP: ORAL_STRIP | 3 refills | Status: DC

## 2023-05-19 DIAGNOSIS — M62838 Other muscle spasm: Secondary | ICD-10-CM | POA: Diagnosis not present

## 2023-05-19 DIAGNOSIS — M6281 Muscle weakness (generalized): Secondary | ICD-10-CM | POA: Diagnosis not present

## 2023-05-19 DIAGNOSIS — M6289 Other specified disorders of muscle: Secondary | ICD-10-CM | POA: Diagnosis not present

## 2023-05-19 DIAGNOSIS — N3942 Incontinence without sensory awareness: Secondary | ICD-10-CM | POA: Diagnosis not present

## 2023-05-19 DIAGNOSIS — K5900 Constipation, unspecified: Secondary | ICD-10-CM | POA: Diagnosis not present

## 2023-05-21 DIAGNOSIS — H35371 Puckering of macula, right eye: Secondary | ICD-10-CM | POA: Diagnosis not present

## 2023-05-21 DIAGNOSIS — E113412 Type 2 diabetes mellitus with severe nonproliferative diabetic retinopathy with macular edema, left eye: Secondary | ICD-10-CM | POA: Diagnosis not present

## 2023-05-21 DIAGNOSIS — H35372 Puckering of macula, left eye: Secondary | ICD-10-CM | POA: Diagnosis not present

## 2023-05-21 DIAGNOSIS — E113411 Type 2 diabetes mellitus with severe nonproliferative diabetic retinopathy with macular edema, right eye: Secondary | ICD-10-CM | POA: Diagnosis not present

## 2023-05-21 LAB — HM DIABETES EYE EXAM

## 2023-06-10 NOTE — Progress Notes (Signed)
CPE AND FOLLOW UP  Assessment:   Encounter for General Adult Medical Examination with Abnormal Findings Due yearly   Diabetic polyneuropathy associated with diabetes mellitus due to underlying condition (HCC) Continue follow up  Type 2 diabetes mellitus with stage 2 chronic kidney disease, without long-term current use of insulin (HCC) Increase fluids, avoid NSAIDS, monitor sugars, will monitor Discussed trulicity to help lower A1c , pt will consider- is not ready to start now Glimiperide 2 mg daily with breakfast and again at supper if BS > 150 Strongly encouraged to check blood sugar fasting and prior to dinner and keep BS log and bring to next visit - A1c - Routine UA with reflex microscopic - Microalbumin/creatinine urine ratio  Type 2 diabetes mellitus with hyperlipidemia (HCC) STRONGLY suggest getting on low dose statin, patient declines at this time, will consider.  decrease fatty foods increase activity.  - lipid panel  Current moderate episode of depression(HCC) Suggested therapy and possible adjustment of medication, currently on Cymbalta for pain- pt refuses at this time.  Will try to increase exercise Instructed patient to contact office or on-call physician promptly should condition worsen or any new symptoms appear. IF THE PATIENT HAS ANY SUICIDAL OR HOMICIDAL IDEATIONS, CALL THE OFFICE, DISCUSS WITH A SUPPORT MEMBER, OR GO TO THE ER IMMEDIATELY. Patient was agreeable with this plan.   Diabetes mellitus type 2 with peripheral artery disease (HCC) - Had recent ABI at [facility] - Continue to follow with vascular and vein  Diabetes mellitus with macular edema (HCC) - continue follow up every 4-6 weeks with Dr. Luciana Axe   Mixed hyperlipidemia Associated with Type 2 Diabetes Mellitus(HCC) -     Continue diet and exercise -  Lipid Panel -  TSH Pt declines statin medication  Osteoporosis Last DEXA was 2020 T -3.2 Declines repeat DEXA at this  time  Essential hypertension Controlled without medication - continue DASH diet, exercise and monitor at home. Call if greater than 130/80. - CBC, CMP - EKG  Eczema of face - Lotrisone sparingly - Thick face cream such as eucerin or cerave Monitor  Foot drop, bilateral( right is greater than left) Has leg braces and follows with ortho  Vitamin D deficiency Continue Vit D supplementation   Fatty liver Check labs, avoid tylenol, alcohol, weight loss advised.   OSA on CPAP She has not been using.  Dr. Luciana Axe has agreed to be off last summer and eyes were same or better so does not use.   Flu Vaccine need - High dose flu vaccine given   Medication management -     CBC with Differential/Platelet -     COMPLETE METABOLIC PANEL WITH GFR -     Magnesium -     Lipid panel -     TSH -     Hemoglobin A1C w/out eAG -     VITAMIN D 25 Hydroxy (Vit-D Deficiency, Fractures) -     EKG 12-Lead -     Urinalysis, Routine w reflex microscopic -     Microalbumin / creatinine urine ratio     Over 30 minutes of exam, counseling, chart review, and critical decision making was performed  Future Appointments  Date Time Provider Department Center  07/03/2023  9:45 AM Vivi Barrack, DPM [facility]  10/02/2023 11:00 AM Raynelle Dick, NP [facility] None  06/13/2024 10:00 AM Raynelle Dick, NP [facility] None       Subjective:   Grace [name] is a 72  y.o. female who presents for Medicare Annual Wellness Visit and 3 month follow up on hypertension, diabetes, hyperlipidemia, vitamin D def.   She has elevated Calcium with last check and has fluctuated between high and low in the past- last PTH was in 2021 but was normal Lab Results  Component Value Date   PTH 43 10/09/2022   CALCIUM 10.1 02/02/2023   CAION 1.16 07/01/2022    She has noticed for almost a year the skin on her face is rough and itchy    She does follow with vascular and vein specialists due to  PAD. Last visit was 10/06/22  BMI is Body mass index is 26.26 kg/m., she is working on diet and exercise.  Wt Readings from Last 3 Encounters:  06/12/23 153 lb (69.4 kg)  02/02/23 149 lb 3.2 oz (67.7 kg)  01/28/23 149 lb 9.6 oz (67.9 kg)    Her blood pressure is not checked at home. She is not currently on BP medication. Today their BP is BP: 124/72  BP Readings from Last 3 Encounters:  06/12/23 124/72  02/02/23 130/70  01/28/23 130/70  She does not workout. She denies chest pain, shortness of breath, dizziness.   She does have a history of diabetes managed with medication, diet, and exercise.  With PAD had ABI in 2015 that showed mild disease, repeated 08/19/22 showing normal on right, unable to accurately calculate on left not on ASA due to Mac Degen She has CKD stage 2 She has neuropathy which is extensive and requires her to use a walker to avoid falls, she is on cymbalta for neuropathy. She has braces on her legs to help prevent falls, no falls in past year but she is high risk. Does have foot drop bilaterally With hyperlipidemia not at goal- long discussion about statins, will check today and see where we are at She has macular degeneration and edema, diabetic retinopathy  following with Dr. Luciana Axe, not on HCTZ due to this. Goes q 4-6 weeks and each eye is on different days She is getting fasting BS 90-120- outlier mid 200's, glimepiride 2 mg at supper if blood sugar > 150 she is on 4 metformin a day   no low blood sugar.    Lab Results  Component Value Date   HGBA1C 6.7 (H) 01/28/2023   Last GFR Lab Results  Component Value Date   EGFR 71 02/02/2023   Cholesterol is not at goal of < 70 Due to Diabetes. She has declined to take any statin medication. Lab Results  Component Value Date   CHOL 187 01/28/2023   HDL 68 01/28/2023   LDLCALC 94 01/28/2023   TRIG 153 (H) 01/28/2023   CHOLHDL 2.8 01/28/2023    Patient is on Vitamin D supplement. Lab Results  Component Value  Date   VD25OH 86 01/28/2023      Medication Review  Current Outpatient Medications (Endocrine & Metabolic):    glimepiride (AMARYL) 2 MG tablet, Take 1 tab with breakfast and if blood sugar is greater than 150 at 4 PM take one at supper   metFORMIN (GLUCOPHAGE-XR) 500 MG 24 hr tablet, TAKE 2 TABLETS BY MOUTH TWICE A DAY FOR DIABETES    Current Outpatient Medications (Analgesics):    acetaminophen (TYLENOL) 500 MG tablet, Take 1,000 mg by mouth every 8 (eight) hours as needed for moderate pain.   Current Outpatient Medications (Other):    Accu-Chek Softclix Lancets lancets, TEST UP TO 4 TIMES DAILY AS DIRECTED   blood  glucose meter kit and supplies KIT, Dispense based on patient and insurance preference. Use 3 times daily as directed.   Cholecalciferol (DIALYVITE VITAMIN D 5000) 125 MCG (5000 UT) capsule, Take 5,000 Units by mouth daily.   DULoxetine (CYMBALTA) 30 MG capsule, Take 1 capsule (30 mg total) by mouth daily.   Magnesium 250 MG TABS, Take 500 mg by mouth daily.   Misc Natural Products (YUMVS BEET ROOT-TART CHERRY PO), Take 1 tablet by mouth daily.   zinc gluconate 50 MG tablet, Take 50 mg by mouth daily.   estradiol (ESTRACE) 0.1 MG/GM vaginal cream, Place vaginally.   glucose blood (ACCU-CHEK GUIDE) test strip, Test blood sugar 2 times daily  Allergies: Allergies  Allergen Reactions   Gabapentin Swelling    Swelling in legs and feet   Macrobid [Nitrofurantoin] Diarrhea    Current Problems (verified) has Diabetic polyneuropathy (HCC); Foot drop, bilateral; T2_NIDDM; Vitamin D deficiency; Fatty liver; Cholelithiases; Hyperlipidemia associated with type 2 diabetes mellitus (HCC); Diabetes mellitus type 2 with peripheral artery disease (HCC); Diabetic macular edema of right eye with proliferative retinopathy associated with type 2 diabetes mellitus (HCC); Proliferative diabetic retinopathy of left eye with macular edema associated with type 2 diabetes mellitus (HCC); Right  epiretinal membrane; Non-restorative sleep; Snoring; RLS (restless legs syndrome); OSA on CPAP; CKD (chronic kidney disease) stage 2, GFR 60-89 ml/min; Osteoporosis; Lattice corneal dystrophy of both eyes; Epiretinal membrane, left eye; Cellulitis; Acute encephalopathy; and UTI (urinary tract infection) on their problem list.  Screening Tests Immunization History  Administered Date(s) Administered   Influenza, High Dose Seasonal PF 05/12/2016, 07/03/2017, 05/24/2018, 06/10/2019, 05/02/2021, 06/11/2022   Influenza-Unspecified 07/03/2017   PFIZER(Purple Top)SARS-COV-2 Vaccination 09/22/2019, 10/13/2019, 06/15/2020   Pneumococcal Conjugate-13 02/07/2016   Pneumococcal Polysaccharide-23 09/03/2017   Td 02/07/2016   Zoster Recombinant(Shingrix) 10/11/2021, 01/06/2022    Health Maintenance  Topic Date Due   INFLUENZA VACCINE  03/19/2023   COVID-19 Vaccine (4 - 2023-24 season) 04/19/2023   Diabetic kidney evaluation - Urine ACR  06/12/2023   FOOT EXAM  06/12/2023   HEMOGLOBIN A1C  07/30/2023   Medicare Annual Wellness (AWV)  10/10/2023   Diabetic kidney evaluation - eGFR measurement  02/02/2024   OPHTHALMOLOGY EXAM  03/18/2024   Fecal DNA (Cologuard)  06/14/2024   MAMMOGRAM  11/16/2024   DTaP/Tdap/Td (2 - Tdap) 02/06/2026   Pneumonia Vaccine 49+ Years old  Completed   DEXA SCAN  Completed   Zoster Vaccines- Shingrix  Completed   HPV VACCINES  Aged Out     Names of Other Physician/Practitioners you currently use: 1. [facility]- here for primary care 2.  Dr. Luciana Axe q 4-6 weeks Dr. Judy Pimple 2024 Patient Care Team: Lucky Cowboy, MD as PCP - General (Internal Medicine) Luciana Axe Alford Highland, MD as Consulting Physician (Ophthalmology) York Spaniel, MD (Inactive) as Consulting Physician (Neurology) Mateo Flow, MD as Consulting Physician (Ophthalmology) Henreitta Leber, PA-C as Physician Assistant (Obstetrics and Gynecology)  Surgical: She  has  a past surgical history that includes Tonsillectomy; Abdominal hysterectomy; Hip surgery; Ankle fracture surgery; Cataract extraction, bilateral; Cataract extraction w/PHACO (Right, 02/21/2014); Cataract extraction w/PHACO (Left, 04/10/2014); Direct laryngoscopy (Left, 08/02/2021); Mass biopsy (Left, 09/06/2021); and Breast biopsy (Right, 12/03/2021). Family Her family history includes Breast cancer in her maternal aunt; Diabetes in her brother and mother. Social history  She reports that she has never smoked. She has never been exposed to tobacco smoke. She has never used smokeless tobacco. She reports that she does not drink alcohol  and does not use drugs.    Objective:   Today's Vitals   06/12/23 1002  BP: 124/72  Pulse: 64  Temp: (!) 97.3 F (36.3 C)  SpO2: 99%  Weight: 153 lb (69.4 kg)  Height: 5\' 4"  (1.626 m)      Body mass index is 26.26 kg/m.  General Appearance: Well nourished, in no apparent distress.  Eyes: PERRLA, EOMs, conjunctiva no swelling or erythema Sinuses: No Frontal/maxillary tenderness  ENT/Mouth: Ext aud canals clear, normal light reflex with TMs without erythema, bulging. Good dentition. No erythema, swelling, or exudate on post pharynx.  Hearing normal.  Neck: Supple, thyroid normal. No bruits Swelling left neck, possible lymph node Respiratory: Respiratory effort normal, BS equal bilaterally without rales, rhonchi, wheezing or stridor.  Cardio: RRR without murmurs, rubs or gallops. DP and PT slighlty diminished Chest: symmetric, with normal excursions and percussion.  Abdomen: Positive bowel sounds all 4 quadrants, Soft, nontender, no guarding, rebound, hernias, masses, or organomegaly.  Musculoskeletal: Decreased ROM of lower legs/feet bilaterally Skin: Feet  purple discoloration and cool to touch, Skin of face is rough to touch, no clear rash Neuro: Cranial nerves intact, reflexes equal bilaterally. Normal muscle tone, no cerebellar symptoms. Sensation  diminished with monofilament to lower extremities, PT and DP slightly diminished Psych: Awake and oriented X 3, normal affect, Insight and Judgment appropriate.  EKG: NSR, no ST changes     Raynelle Dick, NP   06/12/2023

## 2023-06-12 ENCOUNTER — Encounter: Payer: Self-pay | Admitting: Nurse Practitioner

## 2023-06-12 ENCOUNTER — Other Ambulatory Visit: Payer: Self-pay | Admitting: Nurse Practitioner

## 2023-06-12 ENCOUNTER — Ambulatory Visit (INDEPENDENT_AMBULATORY_CARE_PROVIDER_SITE_OTHER): Payer: Medicare Other | Admitting: Nurse Practitioner

## 2023-06-12 VITALS — BP 124/72 | HR 64 | Temp 97.3°F | Ht 64.0 in | Wt 153.0 lb

## 2023-06-12 DIAGNOSIS — K760 Fatty (change of) liver, not elsewhere classified: Secondary | ICD-10-CM

## 2023-06-12 DIAGNOSIS — Z136 Encounter for screening for cardiovascular disorders: Secondary | ICD-10-CM

## 2023-06-12 DIAGNOSIS — Z79899 Other long term (current) drug therapy: Secondary | ICD-10-CM

## 2023-06-12 DIAGNOSIS — E11311 Type 2 diabetes mellitus with unspecified diabetic retinopathy with macular edema: Secondary | ICD-10-CM | POA: Diagnosis not present

## 2023-06-12 DIAGNOSIS — F321 Major depressive disorder, single episode, moderate: Secondary | ICD-10-CM

## 2023-06-12 DIAGNOSIS — E1122 Type 2 diabetes mellitus with diabetic chronic kidney disease: Secondary | ICD-10-CM

## 2023-06-12 DIAGNOSIS — E0842 Diabetes mellitus due to underlying condition with diabetic polyneuropathy: Secondary | ICD-10-CM | POA: Diagnosis not present

## 2023-06-12 DIAGNOSIS — M810 Age-related osteoporosis without current pathological fracture: Secondary | ICD-10-CM

## 2023-06-12 DIAGNOSIS — E1169 Type 2 diabetes mellitus with other specified complication: Secondary | ICD-10-CM

## 2023-06-12 DIAGNOSIS — I10 Essential (primary) hypertension: Secondary | ICD-10-CM

## 2023-06-12 DIAGNOSIS — E08311 Diabetes mellitus due to underlying condition with unspecified diabetic retinopathy with macular edema: Secondary | ICD-10-CM

## 2023-06-12 DIAGNOSIS — E1151 Type 2 diabetes mellitus with diabetic peripheral angiopathy without gangrene: Secondary | ICD-10-CM | POA: Diagnosis not present

## 2023-06-12 DIAGNOSIS — L309 Dermatitis, unspecified: Secondary | ICD-10-CM

## 2023-06-12 DIAGNOSIS — E559 Vitamin D deficiency, unspecified: Secondary | ICD-10-CM | POA: Diagnosis not present

## 2023-06-12 DIAGNOSIS — Z23 Encounter for immunization: Secondary | ICD-10-CM

## 2023-06-12 DIAGNOSIS — E785 Hyperlipidemia, unspecified: Secondary | ICD-10-CM | POA: Diagnosis not present

## 2023-06-12 DIAGNOSIS — G4733 Obstructive sleep apnea (adult) (pediatric): Secondary | ICD-10-CM

## 2023-06-12 DIAGNOSIS — N182 Chronic kidney disease, stage 2 (mild): Secondary | ICD-10-CM

## 2023-06-12 DIAGNOSIS — Z0001 Encounter for general adult medical examination with abnormal findings: Secondary | ICD-10-CM

## 2023-06-12 MED ORDER — ACCU-CHEK GUIDE VI STRP: ORAL_STRIP | 3 refills | Status: DC

## 2023-06-12 MED ORDER — CLOTRIMAZOLE-BETAMETHASONE 1-0.05 % EX CREA: 1.0000 | TOPICAL_CREAM | Freq: Two times a day (BID) | CUTANEOUS | 2 refills | Status: AC

## 2023-06-12 NOTE — Patient Instructions (Signed)

## 2023-06-13 LAB — COMPLETE METABOLIC PANEL WITH GFR
AG Ratio: 1.9 (calc) (ref 1.0–2.5)
ALT: 7 U/L (ref 6–29)
AST: 13 U/L (ref 10–35)
Albumin: 4.1 g/dL (ref 3.6–5.1)
Alkaline phosphatase (APISO): 62 U/L (ref 37–153)
BUN/Creatinine Ratio: 25 (calc) — ABNORMAL HIGH (ref 6–22)
BUN: 28 mg/dL — ABNORMAL HIGH (ref 7–25)
CO2: 31 mmol/L (ref 20–32)
Calcium: 10.1 mg/dL (ref 8.6–10.4)
Chloride: 102 mmol/L (ref 98–110)
Creat: 1.13 mg/dL — ABNORMAL HIGH (ref 0.60–1.00)
Globulin: 2.2 g/dL (ref 1.9–3.7)
Glucose, Bld: 140 mg/dL — ABNORMAL HIGH (ref 65–99)
Potassium: 4.9 mmol/L (ref 3.5–5.3)
Sodium: 140 mmol/L (ref 135–146)
Total Bilirubin: 0.3 mg/dL (ref 0.2–1.2)
Total Protein: 6.3 g/dL (ref 6.1–8.1)
eGFR: 51 mL/min/{1.73_m2} — ABNORMAL LOW (ref >=60)

## 2023-06-13 LAB — CBC WITH DIFFERENTIAL/PLATELET
Absolute Lymphocytes: 1846 {cells}/uL (ref 850–3900)
Absolute Monocytes: 548 {cells}/uL (ref 200–950)
Basophils Absolute: 69 {cells}/uL (ref 0–200)
Basophils Relative: 1.1 %
Eosinophils Absolute: 290 {cells}/uL (ref 15–500)
Eosinophils Relative: 4.6 %
HCT: 35.1 % (ref 35.0–45.0)
Hemoglobin: 11.1 g/dL — ABNORMAL LOW (ref 11.7–15.5)
MCH: 29.4 pg (ref 27.0–33.0)
MCHC: 31.6 g/dL — ABNORMAL LOW (ref 32.0–36.0)
MCV: 92.9 fL (ref 80.0–100.0)
MPV: 9.5 fL (ref 7.5–12.5)
Monocytes Relative: 8.7 %
Neutro Abs: 3547 {cells}/uL (ref 1500–7800)
Neutrophils Relative %: 56.3 %
Platelets: 442 10*3/uL — ABNORMAL HIGH (ref 140–400)
RBC: 3.78 10*6/uL — ABNORMAL LOW (ref 3.80–5.10)
RDW: 13.5 % (ref 11.0–15.0)
Total Lymphocyte: 29.3 %
WBC: 6.3 10*3/uL (ref 3.8–10.8)

## 2023-06-13 LAB — URINALYSIS, ROUTINE W REFLEX MICROSCOPIC
Bacteria, UA: NONE SEEN /[HPF]
Bilirubin Urine: NEGATIVE
Glucose, UA: NEGATIVE
Hgb urine dipstick: NEGATIVE
Hyaline Cast: NONE SEEN /[LPF]
Ketones, ur: NEGATIVE
Nitrite: NEGATIVE
Protein, ur: NEGATIVE
RBC / HPF: NONE SEEN /[HPF] (ref 0–2)
Specific Gravity, Urine: 1.012 (ref 1.001–1.035)
Squamous Epithelial / HPF: NONE SEEN /[HPF] (ref <=5)
WBC, UA: NONE SEEN /[HPF] (ref 0–5)
pH: 7 (ref 5.0–8.0)

## 2023-06-13 LAB — LIPID PANEL
Cholesterol: 206 mg/dL — ABNORMAL HIGH (ref <200)
HDL: 61 mg/dL (ref >=50)
LDL Cholesterol (Calc): 115 mg/dL — ABNORMAL HIGH
Non-HDL Cholesterol (Calc): 145 mg/dL — ABNORMAL HIGH (ref <130)
Total CHOL/HDL Ratio: 3.4 (calc) (ref <5.0)
Triglycerides: 182 mg/dL — ABNORMAL HIGH (ref <150)

## 2023-06-13 LAB — MICROALBUMIN / CREATININE URINE RATIO
Creatinine, Urine: 47 mg/dL (ref 20–275)
Microalb, Ur: <0.2 mg/dL

## 2023-06-13 LAB — TSH: TSH: 2.17 m[IU]/L (ref 0.40–4.50)

## 2023-06-13 LAB — MICROSCOPIC MESSAGE

## 2023-06-13 LAB — VITAMIN D 25 HYDROXY (VIT D DEFICIENCY, FRACTURES): Vit D, 25-Hydroxy: 80 ng/mL (ref 30–100)

## 2023-06-13 LAB — MAGNESIUM: Magnesium: 1.9 mg/dL (ref 1.5–2.5)

## 2023-06-13 LAB — HEMOGLOBIN A1C W/OUT EAG: Hgb A1c MFr Bld: 6.9 %{Hb} — ABNORMAL HIGH (ref <5.7)

## 2023-06-29 DIAGNOSIS — E113412 Type 2 diabetes mellitus with severe nonproliferative diabetic retinopathy with macular edema, left eye: Secondary | ICD-10-CM | POA: Diagnosis not present

## 2023-06-29 DIAGNOSIS — E113411 Type 2 diabetes mellitus with severe nonproliferative diabetic retinopathy with macular edema, right eye: Secondary | ICD-10-CM | POA: Diagnosis not present

## 2023-06-29 DIAGNOSIS — H35373 Puckering of macula, bilateral: Secondary | ICD-10-CM | POA: Diagnosis not present

## 2023-07-03 ENCOUNTER — Ambulatory Visit (INDEPENDENT_AMBULATORY_CARE_PROVIDER_SITE_OTHER): Payer: Medicare Other | Admitting: Podiatry

## 2023-07-03 DIAGNOSIS — E1151 Type 2 diabetes mellitus with diabetic peripheral angiopathy without gangrene: Secondary | ICD-10-CM

## 2023-07-03 DIAGNOSIS — B351 Tinea unguium: Secondary | ICD-10-CM | POA: Diagnosis not present

## 2023-07-03 DIAGNOSIS — M79675 Pain in left toe(s): Secondary | ICD-10-CM

## 2023-07-03 DIAGNOSIS — M79674 Pain in right toe(s): Secondary | ICD-10-CM

## 2023-07-03 NOTE — Progress Notes (Signed)
Subjective: 72 year old female presents the office today for concerns of thick, discolored toenails that she cannot trim herself.  She has no other concerns today. No open lesions.   Objective: AAO x3, NAD DP/PT pulses 1/4 bilaterally, CRT less than 3 seconds Sensation decreased  Chronic discoloration of the left forefoot. Feet are of equal temperature.  Callus left submetatarsal 1 and plantar hallux and 2nd digit without skin breakdown.  Nails are hypertrophic, dystrophic, brittle, discolored, elongated 10. No surrounding redness or drainage. Tenderness nails 1-5 bilaterally. No open lesions or pre-ulcerative lesions are identified today. No pain with calf compression, swelling, warmth, erythema  Assessment: Symptomatic onychomycosis; PAD; neuropathy   Plan: -All treatment options discussed with the patient including all alternatives, risks, complications.  -She will be debrided nails x 10 are any complications or bleeding.   For now we will continue with routine debridement.  (Previously was prescribed Penlac but she was told it would not be helpful since she has not been using it.) -Sharply debrided hyperkeratotic lesions x 3 without complications or bleeding. -Daily foot inspection  -Patient encouraged to call the office with any questions, concerns, change in symptoms.   Return in about 3 months (around 10/03/2023).  Vivi Barrack DPM

## 2023-07-13 DIAGNOSIS — E113411 Type 2 diabetes mellitus with severe nonproliferative diabetic retinopathy with macular edema, right eye: Secondary | ICD-10-CM | POA: Diagnosis not present

## 2023-07-13 DIAGNOSIS — E113412 Type 2 diabetes mellitus with severe nonproliferative diabetic retinopathy with macular edema, left eye: Secondary | ICD-10-CM | POA: Diagnosis not present

## 2023-07-13 DIAGNOSIS — H35373 Puckering of macula, bilateral: Secondary | ICD-10-CM | POA: Diagnosis not present

## 2023-07-30 ENCOUNTER — Encounter: Payer: Self-pay | Admitting: Internal Medicine

## 2023-08-17 DIAGNOSIS — E113411 Type 2 diabetes mellitus with severe nonproliferative diabetic retinopathy with macular edema, right eye: Secondary | ICD-10-CM | POA: Diagnosis not present

## 2023-08-17 DIAGNOSIS — H35373 Puckering of macula, bilateral: Secondary | ICD-10-CM | POA: Diagnosis not present

## 2023-08-17 DIAGNOSIS — E113412 Type 2 diabetes mellitus with severe nonproliferative diabetic retinopathy with macular edema, left eye: Secondary | ICD-10-CM | POA: Diagnosis not present

## 2023-08-31 DIAGNOSIS — E113412 Type 2 diabetes mellitus with severe nonproliferative diabetic retinopathy with macular edema, left eye: Secondary | ICD-10-CM | POA: Diagnosis not present

## 2023-08-31 DIAGNOSIS — H02833 Dermatochalasis of right eye, unspecified eyelid: Secondary | ICD-10-CM | POA: Diagnosis not present

## 2023-08-31 DIAGNOSIS — H02836 Dermatochalasis of left eye, unspecified eyelid: Secondary | ICD-10-CM | POA: Diagnosis not present

## 2023-08-31 DIAGNOSIS — E113411 Type 2 diabetes mellitus with severe nonproliferative diabetic retinopathy with macular edema, right eye: Secondary | ICD-10-CM | POA: Diagnosis not present

## 2023-08-31 DIAGNOSIS — H35373 Puckering of macula, bilateral: Secondary | ICD-10-CM | POA: Diagnosis not present

## 2023-09-01 IMAGING — US US BREAST*R* LIMITED INC AXILLA
1 series · 8 of 8 positions shown · non-contrast
Comparison: Previous exam(s).

CLINICAL DATA: 70-year-old female presenting for annual bilateral
mammogram and final follow-up of bilateral breast findings.

EXAM:
DIGITAL DIAGNOSTIC BILATERAL MAMMOGRAM WITH TOMOSYNTHESIS AND CAD;
ULTRASOUND RIGHT BREAST LIMITED
TECHNIQUE: Bilateral digital diagnostic mammography and breast tomosynthesis
was performed. The images were evaluated with computer-aided
detection.; Targeted ultrasound examination of the right breast was
performed

[Series 1: us breast*right* limited inc axilla · 0.05mm/px · 8 of 8 slices shown]
[im 1/8]
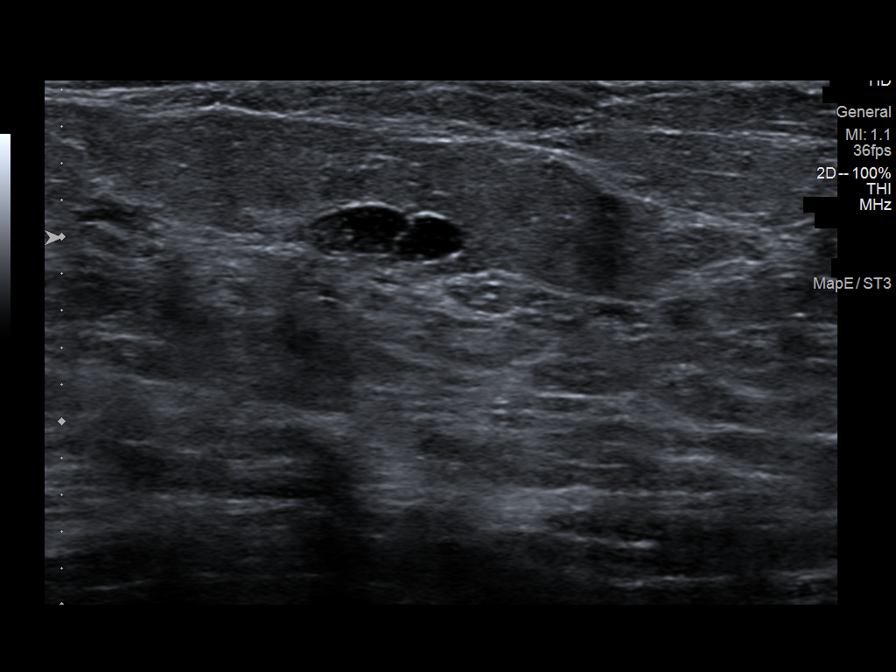
[im 2/8]
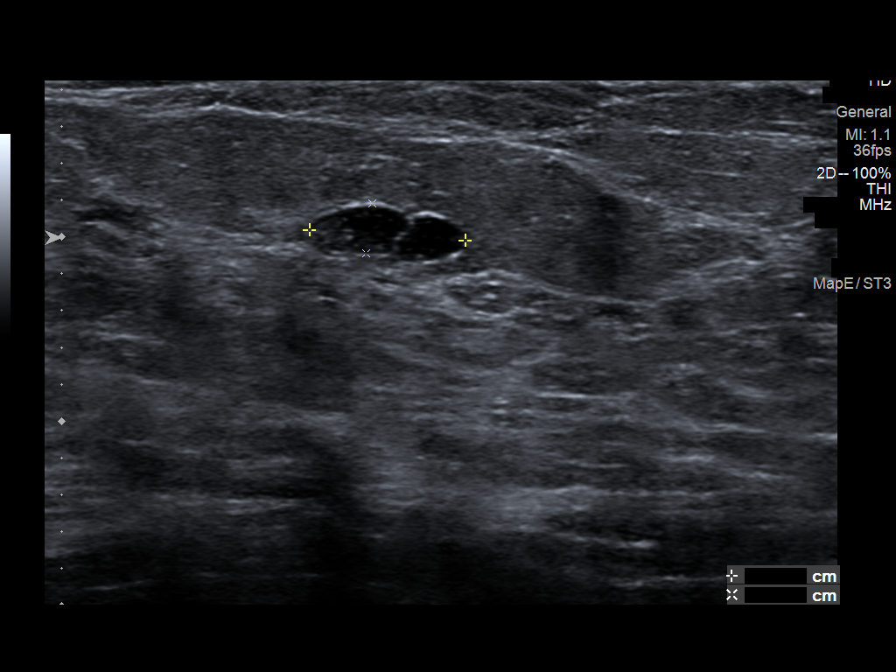
[im 3/8]
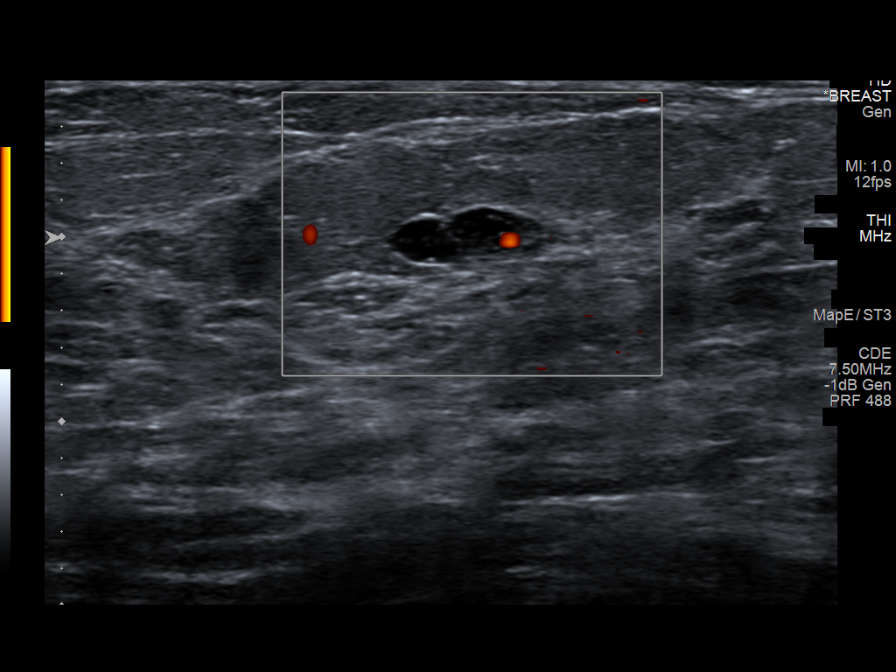
[im 4/8]
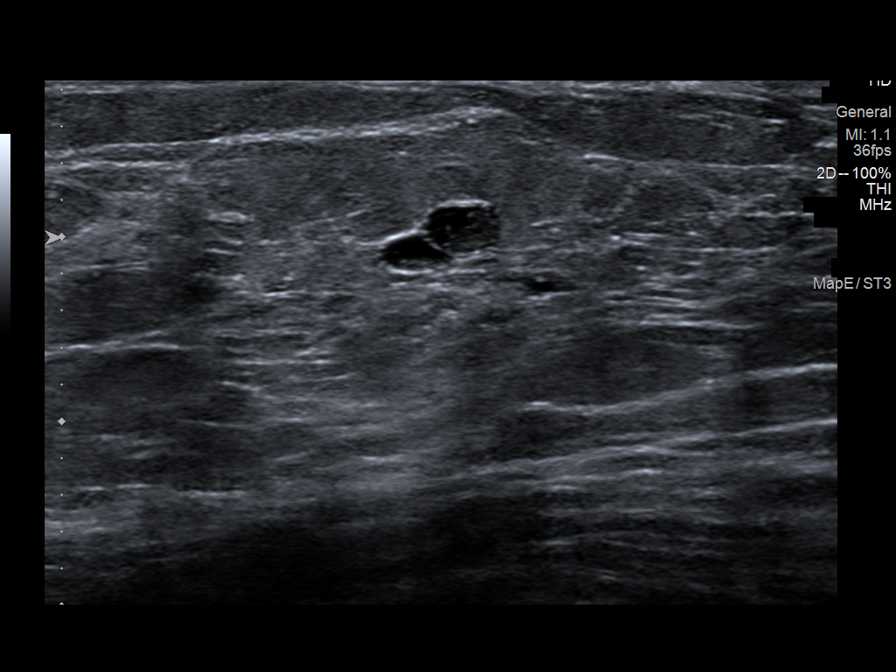
[im 5/8]
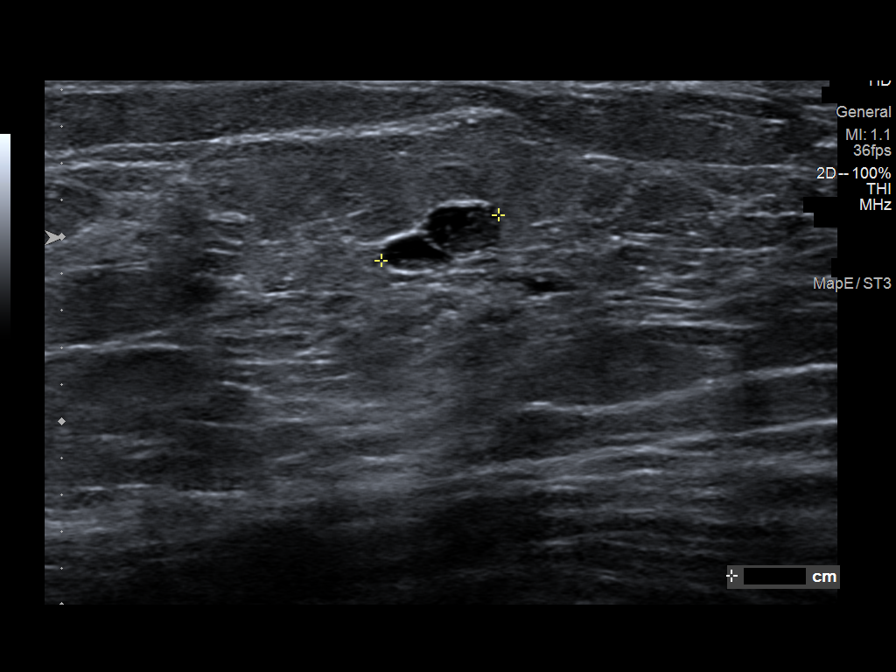
[im 6/8]
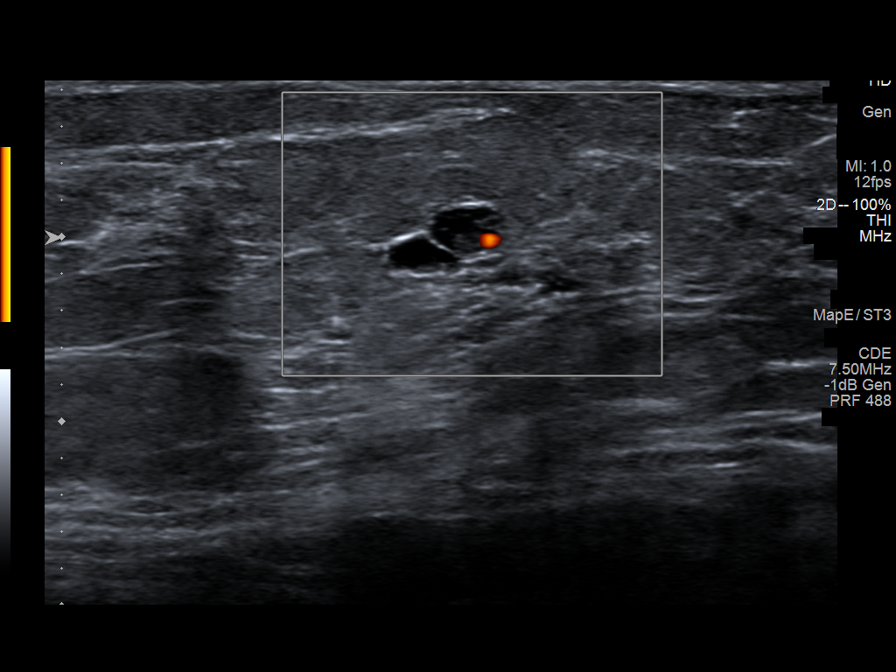
[im 7/8]
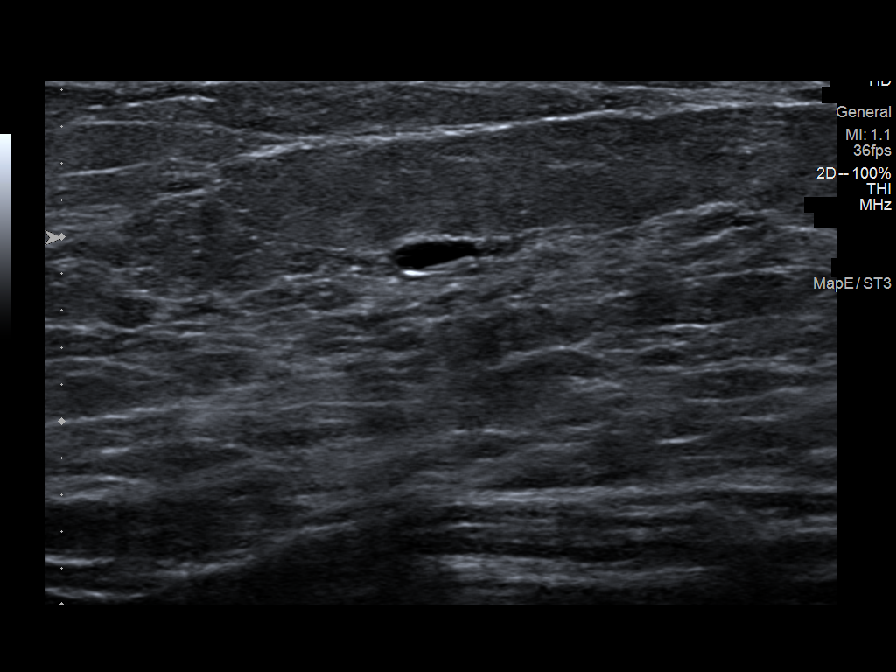
[im 8/8]
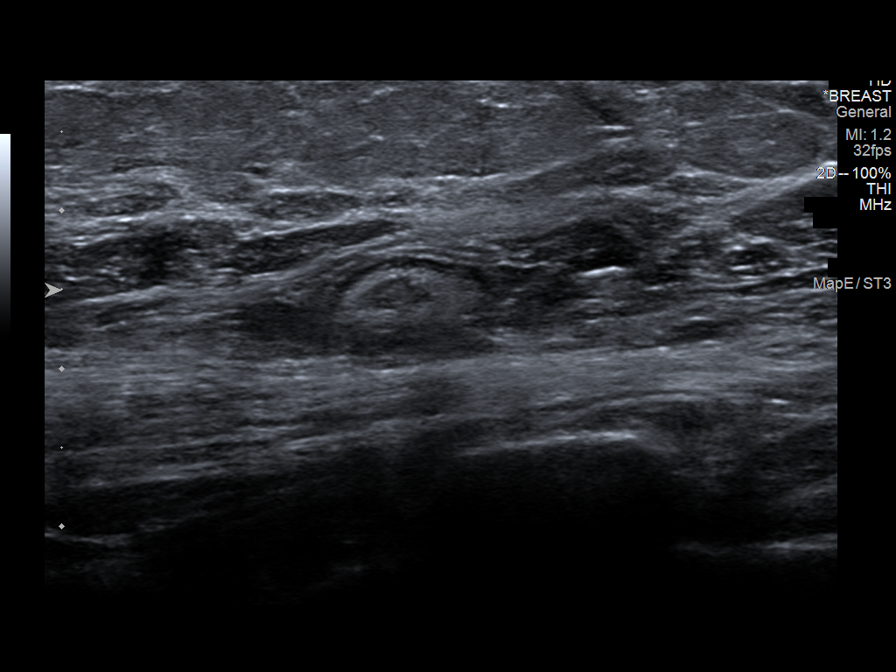

[8 of 8 positions shown; findings below may reference images not displayed]

ACR Breast Density Category b: There are scattered areas of
fibroglandular density.
FINDINGS: A circumscribed mass in the upper outer right breast is slightly
enlarged mammographically. A masslike asymmetry in the lateral left
breast is less conspicuous today. Otherwise, no new or suspicious
findings in either breast.

Targeted ultrasound is performed, showing a circumscribed, bilobed
mass with internal hypoechoic component at the 10 o'clock position 2
cm from the nipple. It measures 9 x 3 x 7 mm. There is associated
vascularity. Evaluation of the right axilla demonstrates no
suspicious lymphadenopathy.
IMPRESSION: 1. Indeterminate right breast mass at the 10 o'clock position.
Recommend ultrasound-guided biopsy.
2. No suspicious right axillary lymphadenopathy.
3. No mammographic evidence of malignancy on the left.

RECOMMENDATION:
Ultrasound-guided biopsy of the right breast.

I have discussed the findings and recommendations with the patient.
If applicable, a reminder letter will be sent to the patient
regarding the next appointment.

BI-RADS CATEGORY  4: Suspicious.

## 2023-09-01 IMAGING — MG DIGITAL DIAGNOSTIC BILAT W/ TOMO W/ CAD
8 series · 8 of 24 positions shown · non-contrast
Comparison: Previous exam(s).

CLINICAL DATA: 70-year-old female presenting for annual bilateral
mammogram and final follow-up of bilateral breast findings.

EXAM:
DIGITAL DIAGNOSTIC BILATERAL MAMMOGRAM WITH TOMOSYNTHESIS AND CAD;
ULTRASOUND RIGHT BREAST LIMITED
TECHNIQUE: Bilateral digital diagnostic mammography and breast tomosynthesis
was performed. The images were evaluated with computer-aided
detection.; Targeted ultrasound examination of the right breast was
performed

[R CC synth-2D]
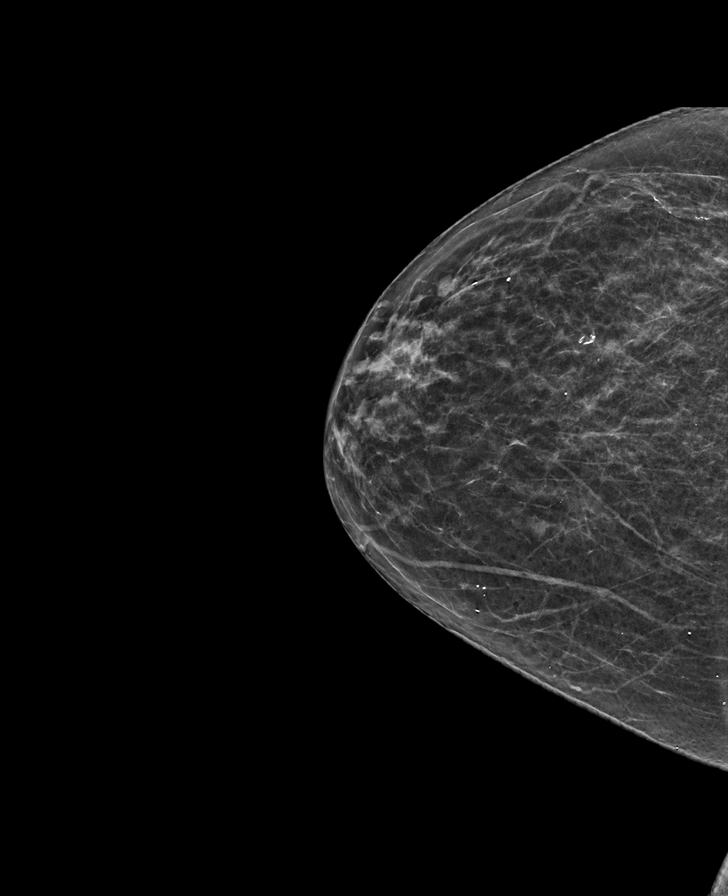

[L MLO synth-2D]
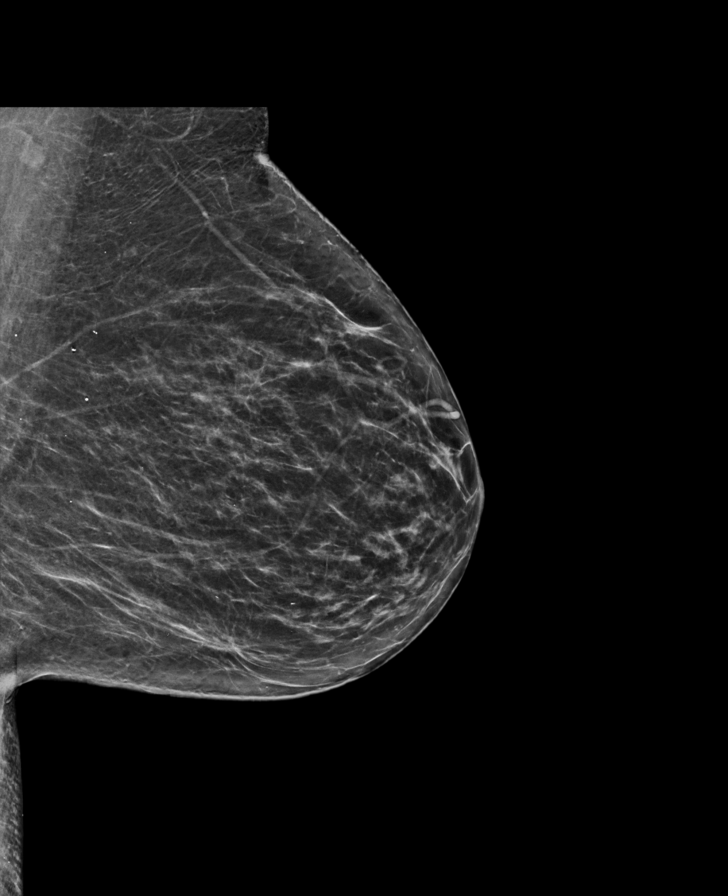

[L CC synth-2D]
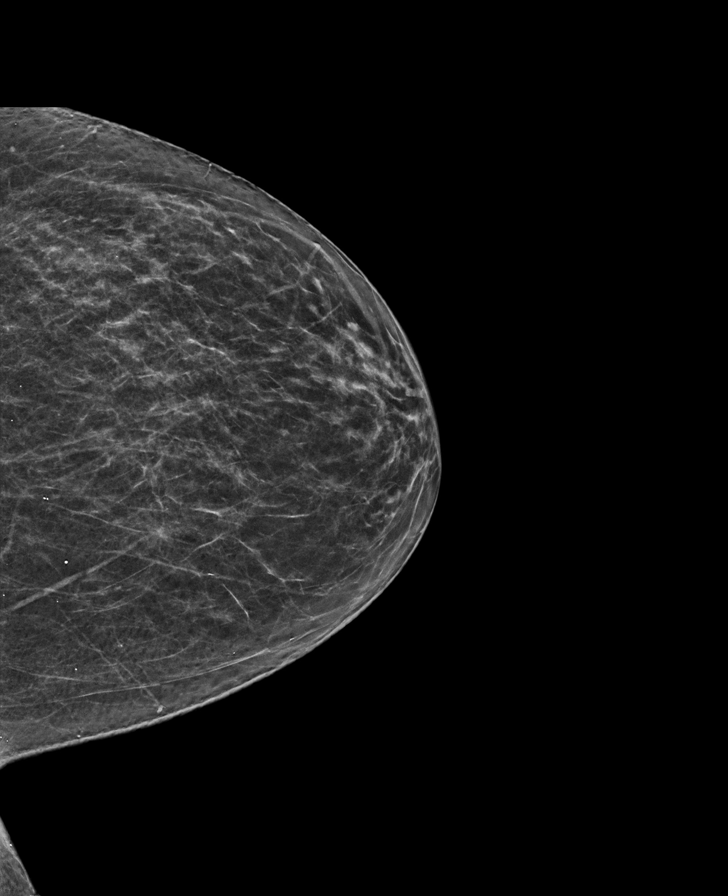

[R MLO synth-2D]
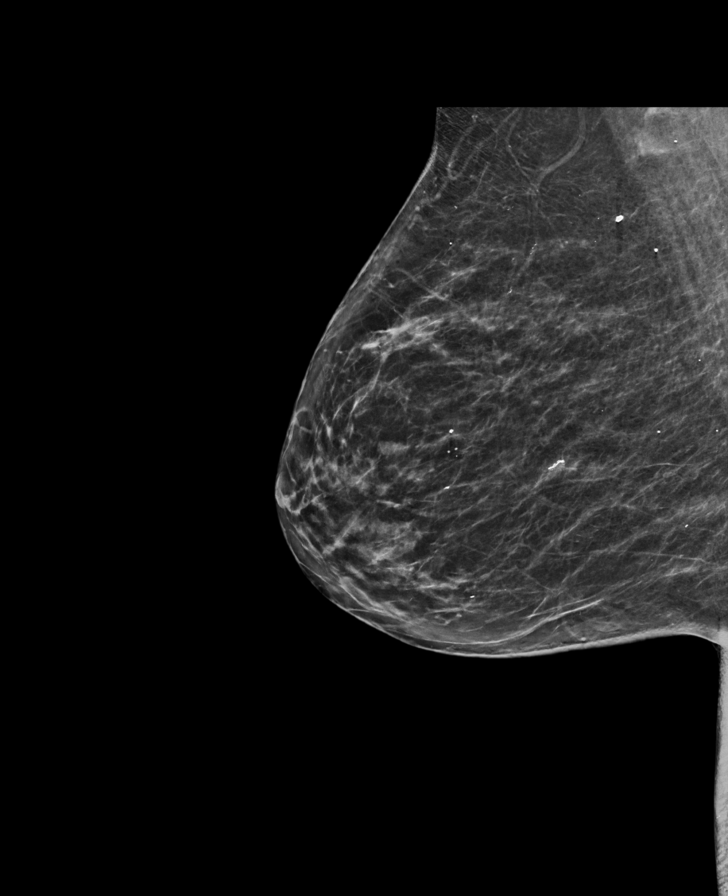

[R CC tomo · tomo slice 27/54.0]
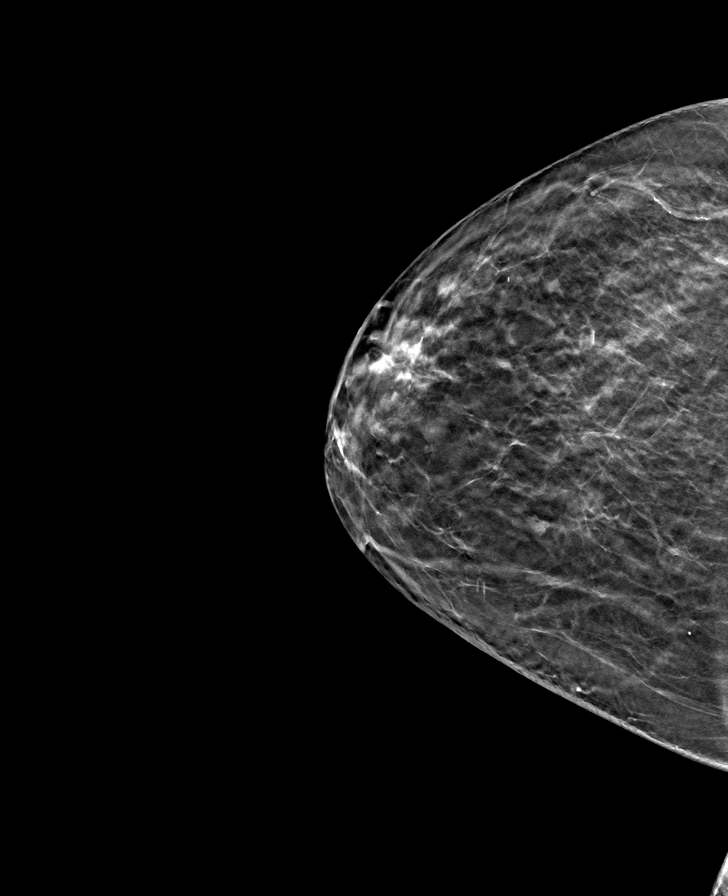

[R MLO tomo · tomo slice 34/67.0]
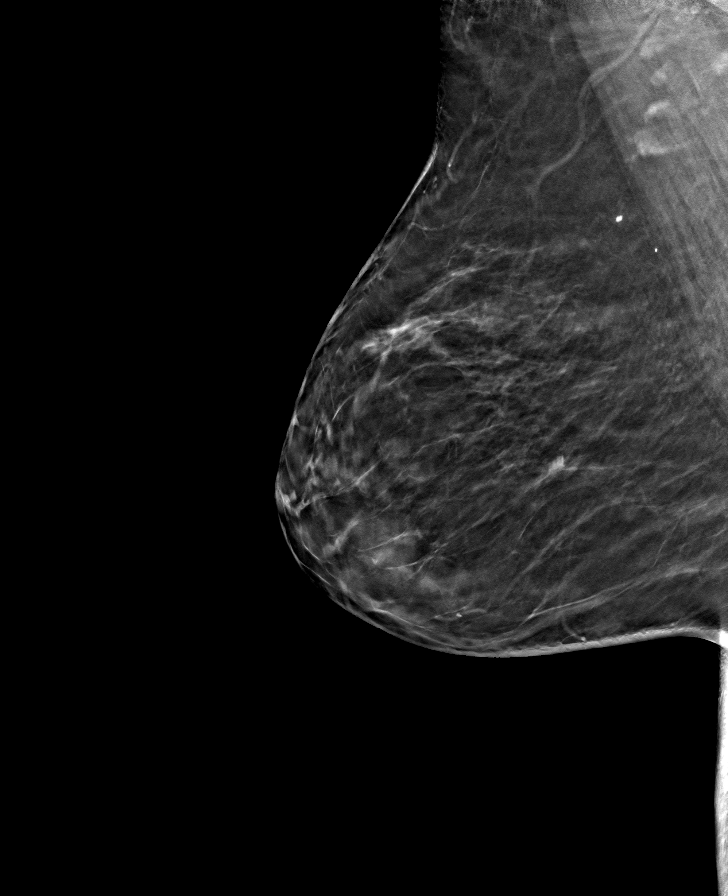

[L CC tomo · tomo slice 29/57.0]
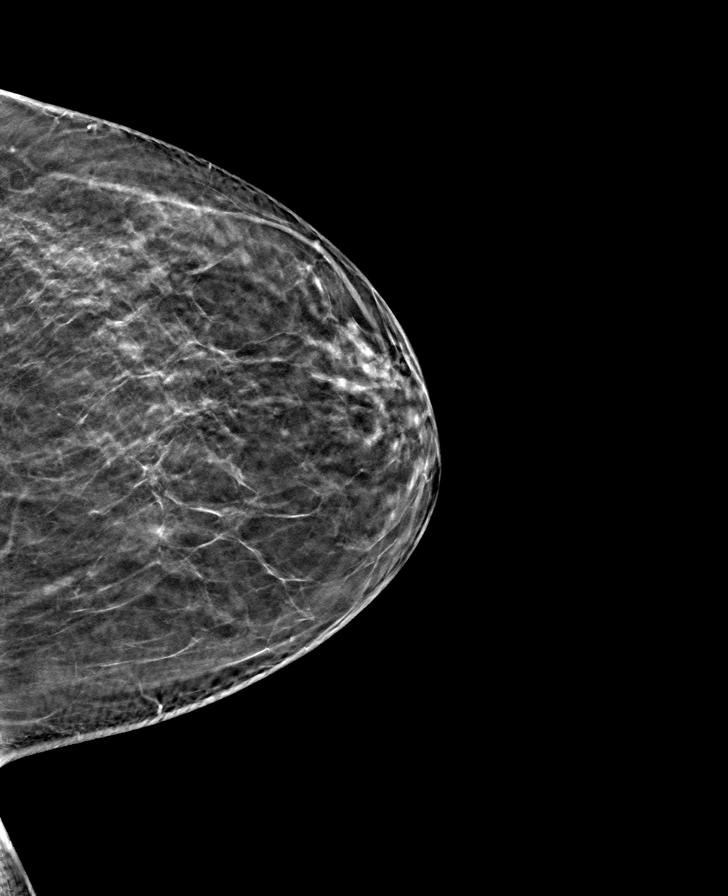

[L MLO tomo · tomo slice 34/67.0]
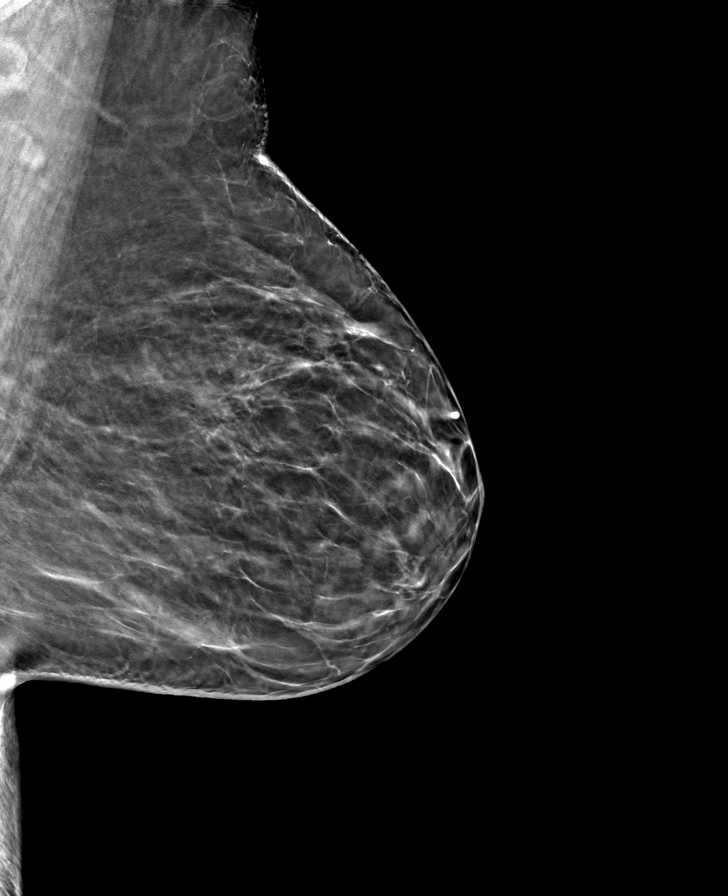

[8 of 24 positions shown; findings below may reference images not displayed]

ACR Breast Density Category b: There are scattered areas of
fibroglandular density.
FINDINGS: A circumscribed mass in the upper outer right breast is slightly
enlarged mammographically. A masslike asymmetry in the lateral left
breast is less conspicuous today. Otherwise, no new or suspicious
findings in either breast.

Targeted ultrasound is performed, showing a circumscribed, bilobed
mass with internal hypoechoic component at the 10 o'clock position 2
cm from the nipple. It measures 9 x 3 x 7 mm. There is associated
vascularity. Evaluation of the right axilla demonstrates no
suspicious lymphadenopathy.
IMPRESSION: 1. Indeterminate right breast mass at the 10 o'clock position.
Recommend ultrasound-guided biopsy.
2. No suspicious right axillary lymphadenopathy.
3. No mammographic evidence of malignancy on the left.

RECOMMENDATION:
Ultrasound-guided biopsy of the right breast.

I have discussed the findings and recommendations with the patient.
If applicable, a reminder letter will be sent to the patient
regarding the next appointment.

BI-RADS CATEGORY  4: Suspicious.

## 2023-09-21 DIAGNOSIS — N3942 Incontinence without sensory awareness: Secondary | ICD-10-CM | POA: Diagnosis not present

## 2023-09-21 DIAGNOSIS — M6281 Muscle weakness (generalized): Secondary | ICD-10-CM | POA: Diagnosis not present

## 2023-09-21 DIAGNOSIS — M6289 Other specified disorders of muscle: Secondary | ICD-10-CM | POA: Diagnosis not present

## 2023-09-21 DIAGNOSIS — M62838 Other muscle spasm: Secondary | ICD-10-CM | POA: Diagnosis not present

## 2023-09-21 DIAGNOSIS — K5900 Constipation, unspecified: Secondary | ICD-10-CM | POA: Diagnosis not present

## 2023-09-25 ENCOUNTER — Other Ambulatory Visit: Payer: Self-pay

## 2023-09-25 DIAGNOSIS — I6523 Occlusion and stenosis of bilateral carotid arteries: Secondary | ICD-10-CM

## 2023-09-28 DIAGNOSIS — H35373 Puckering of macula, bilateral: Secondary | ICD-10-CM | POA: Diagnosis not present

## 2023-09-28 DIAGNOSIS — H02833 Dermatochalasis of right eye, unspecified eyelid: Secondary | ICD-10-CM | POA: Diagnosis not present

## 2023-09-28 DIAGNOSIS — E113412 Type 2 diabetes mellitus with severe nonproliferative diabetic retinopathy with macular edema, left eye: Secondary | ICD-10-CM | POA: Diagnosis not present

## 2023-09-28 DIAGNOSIS — H02836 Dermatochalasis of left eye, unspecified eyelid: Secondary | ICD-10-CM | POA: Diagnosis not present

## 2023-09-28 DIAGNOSIS — E113411 Type 2 diabetes mellitus with severe nonproliferative diabetic retinopathy with macular edema, right eye: Secondary | ICD-10-CM | POA: Diagnosis not present

## 2023-10-02 ENCOUNTER — Ambulatory Visit: Payer: Medicare Other | Admitting: Nurse Practitioner

## 2023-10-05 ENCOUNTER — Encounter: Payer: Self-pay | Admitting: Podiatry

## 2023-10-05 ENCOUNTER — Ambulatory Visit (INDEPENDENT_AMBULATORY_CARE_PROVIDER_SITE_OTHER): Payer: Medicare Other | Admitting: Podiatry

## 2023-10-05 DIAGNOSIS — B351 Tinea unguium: Secondary | ICD-10-CM | POA: Diagnosis not present

## 2023-10-05 DIAGNOSIS — M79674 Pain in right toe(s): Secondary | ICD-10-CM

## 2023-10-05 DIAGNOSIS — I739 Peripheral vascular disease, unspecified: Secondary | ICD-10-CM | POA: Diagnosis not present

## 2023-10-05 DIAGNOSIS — M79675 Pain in left toe(s): Secondary | ICD-10-CM

## 2023-10-05 DIAGNOSIS — L84 Corns and callosities: Secondary | ICD-10-CM

## 2023-10-05 NOTE — Progress Notes (Signed)
 Subjective: Chief Complaint  Patient presents with   [facility]    RM#12 [facility] nail trim    73 year old female presents the office today for concerns of thick, discolored toenails that she cannot trim herself.  She still has redness to her left foot, which has been chronic.  She is scheduled for follow-up ABI on Thursday.  She has no other concerns today. No open lesions.   Objective: AAO x3, NAD DP/PT pulses decreased bilaterally, CRT less than 3 seconds Sensation decreased  Chronic discoloration of the left forefoot. Feet are of equal temperature.  Callus left submetatarsal 1 and plantar hallux and 2nd digit without skin breakdown.  Nails are hypertrophic, dystrophic, brittle, discolored, elongated 10. No surrounding redness or drainage. Tenderness nails 1-5 bilaterally. No open lesions or pre-ulcerative lesions are identified today. No pain with calf compression, swelling, warmth, erythema  Assessment: Symptomatic onychomycosis; PAD; neuropathy   Plan: -All treatment options discussed with the patient including all alternatives, risks, complications.  -She will be debrided nails x 10 are any complications or bleeding.   For now we will continue with routine debridement.  (Previously was prescribed Penlac but she was told it would not be helpful since she has not been using it.) -Sharply debrided hyperkeratotic lesions x 3 without complications or bleeding. -Daily foot inspection  -Patient encouraged to call the office with any questions, concerns, change in symptoms.   RTC 4 months or sooner if needed  Vivi Barrack DPM

## 2023-10-07 ENCOUNTER — Other Ambulatory Visit: Payer: Self-pay | Admitting: Family

## 2023-10-07 ENCOUNTER — Other Ambulatory Visit: Payer: Self-pay

## 2023-10-07 MED ORDER — ACCU-CHEK GUIDE TEST VI STRP: ORAL_STRIP | 12 refills | Status: AC

## 2023-10-08 ENCOUNTER — Ambulatory Visit (HOSPITAL_COMMUNITY): Admission: RE | Admit: 2023-10-08 | Payer: Medicare Other | Source: Ambulatory Visit

## 2023-10-08 ENCOUNTER — Ambulatory Visit: Payer: Medicare Other

## 2023-10-19 DIAGNOSIS — H02833 Dermatochalasis of right eye, unspecified eyelid: Secondary | ICD-10-CM | POA: Diagnosis not present

## 2023-10-19 DIAGNOSIS — E113411 Type 2 diabetes mellitus with severe nonproliferative diabetic retinopathy with macular edema, right eye: Secondary | ICD-10-CM | POA: Diagnosis not present

## 2023-10-19 DIAGNOSIS — H02836 Dermatochalasis of left eye, unspecified eyelid: Secondary | ICD-10-CM | POA: Diagnosis not present

## 2023-10-19 DIAGNOSIS — H35373 Puckering of macula, bilateral: Secondary | ICD-10-CM | POA: Diagnosis not present

## 2023-10-19 DIAGNOSIS — E113412 Type 2 diabetes mellitus with severe nonproliferative diabetic retinopathy with macular edema, left eye: Secondary | ICD-10-CM | POA: Diagnosis not present

## 2023-11-12 DIAGNOSIS — H02833 Dermatochalasis of right eye, unspecified eyelid: Secondary | ICD-10-CM | POA: Diagnosis not present

## 2023-11-12 DIAGNOSIS — H02836 Dermatochalasis of left eye, unspecified eyelid: Secondary | ICD-10-CM | POA: Diagnosis not present

## 2023-11-12 DIAGNOSIS — E113412 Type 2 diabetes mellitus with severe nonproliferative diabetic retinopathy with macular edema, left eye: Secondary | ICD-10-CM | POA: Diagnosis not present

## 2023-11-12 DIAGNOSIS — H35373 Puckering of macula, bilateral: Secondary | ICD-10-CM | POA: Diagnosis not present

## 2023-11-12 DIAGNOSIS — E113411 Type 2 diabetes mellitus with severe nonproliferative diabetic retinopathy with macular edema, right eye: Secondary | ICD-10-CM | POA: Diagnosis not present

## 2023-11-30 DIAGNOSIS — E11319 Type 2 diabetes mellitus with unspecified diabetic retinopathy without macular edema: Secondary | ICD-10-CM | POA: Diagnosis not present

## 2023-11-30 DIAGNOSIS — E1142 Type 2 diabetes mellitus with diabetic polyneuropathy: Secondary | ICD-10-CM | POA: Diagnosis not present

## 2023-12-07 DIAGNOSIS — H02836 Dermatochalasis of left eye, unspecified eyelid: Secondary | ICD-10-CM | POA: Diagnosis not present

## 2023-12-07 DIAGNOSIS — E113411 Type 2 diabetes mellitus with severe nonproliferative diabetic retinopathy with macular edema, right eye: Secondary | ICD-10-CM | POA: Diagnosis not present

## 2023-12-07 DIAGNOSIS — H02833 Dermatochalasis of right eye, unspecified eyelid: Secondary | ICD-10-CM | POA: Diagnosis not present

## 2023-12-07 DIAGNOSIS — H35372 Puckering of macula, left eye: Secondary | ICD-10-CM | POA: Diagnosis not present

## 2023-12-07 DIAGNOSIS — H35371 Puckering of macula, right eye: Secondary | ICD-10-CM | POA: Diagnosis not present

## 2023-12-07 DIAGNOSIS — G4733 Obstructive sleep apnea (adult) (pediatric): Secondary | ICD-10-CM | POA: Diagnosis not present

## 2023-12-07 DIAGNOSIS — E113412 Type 2 diabetes mellitus with severe nonproliferative diabetic retinopathy with macular edema, left eye: Secondary | ICD-10-CM | POA: Diagnosis not present

## 2023-12-24 DIAGNOSIS — E113411 Type 2 diabetes mellitus with severe nonproliferative diabetic retinopathy with macular edema, right eye: Secondary | ICD-10-CM | POA: Diagnosis not present

## 2023-12-24 DIAGNOSIS — G4733 Obstructive sleep apnea (adult) (pediatric): Secondary | ICD-10-CM | POA: Diagnosis not present

## 2023-12-24 DIAGNOSIS — E113412 Type 2 diabetes mellitus with severe nonproliferative diabetic retinopathy with macular edema, left eye: Secondary | ICD-10-CM | POA: Diagnosis not present

## 2023-12-24 DIAGNOSIS — H35372 Puckering of macula, left eye: Secondary | ICD-10-CM | POA: Diagnosis not present

## 2023-12-24 DIAGNOSIS — H02836 Dermatochalasis of left eye, unspecified eyelid: Secondary | ICD-10-CM | POA: Diagnosis not present

## 2023-12-24 DIAGNOSIS — H35371 Puckering of macula, right eye: Secondary | ICD-10-CM | POA: Diagnosis not present

## 2023-12-24 DIAGNOSIS — H02833 Dermatochalasis of right eye, unspecified eyelid: Secondary | ICD-10-CM | POA: Diagnosis not present

## 2024-01-25 DIAGNOSIS — H35372 Puckering of macula, left eye: Secondary | ICD-10-CM | POA: Diagnosis not present

## 2024-01-25 DIAGNOSIS — E113411 Type 2 diabetes mellitus with severe nonproliferative diabetic retinopathy with macular edema, right eye: Secondary | ICD-10-CM | POA: Diagnosis not present

## 2024-01-25 DIAGNOSIS — H02836 Dermatochalasis of left eye, unspecified eyelid: Secondary | ICD-10-CM | POA: Diagnosis not present

## 2024-01-25 DIAGNOSIS — E113412 Type 2 diabetes mellitus with severe nonproliferative diabetic retinopathy with macular edema, left eye: Secondary | ICD-10-CM | POA: Diagnosis not present

## 2024-01-25 DIAGNOSIS — H02833 Dermatochalasis of right eye, unspecified eyelid: Secondary | ICD-10-CM | POA: Diagnosis not present

## 2024-01-25 DIAGNOSIS — H35371 Puckering of macula, right eye: Secondary | ICD-10-CM | POA: Diagnosis not present

## 2024-02-02 ENCOUNTER — Ambulatory Visit (INDEPENDENT_AMBULATORY_CARE_PROVIDER_SITE_OTHER): Payer: Medicare Other | Admitting: Podiatry

## 2024-02-02 ENCOUNTER — Encounter: Payer: Self-pay | Admitting: Podiatry

## 2024-02-02 DIAGNOSIS — M79674 Pain in right toe(s): Secondary | ICD-10-CM | POA: Diagnosis not present

## 2024-02-02 DIAGNOSIS — M79675 Pain in left toe(s): Secondary | ICD-10-CM | POA: Diagnosis not present

## 2024-02-02 DIAGNOSIS — B351 Tinea unguium: Secondary | ICD-10-CM | POA: Diagnosis not present

## 2024-02-02 DIAGNOSIS — I739 Peripheral vascular disease, unspecified: Secondary | ICD-10-CM | POA: Diagnosis not present

## 2024-02-02 DIAGNOSIS — L97521 Non-pressure chronic ulcer of other part of left foot limited to breakdown of skin: Secondary | ICD-10-CM

## 2024-02-02 DIAGNOSIS — L84 Corns and callosities: Secondary | ICD-10-CM

## 2024-02-02 NOTE — Patient Instructions (Signed)
 Wash the wound on the left foot with soap and water, dry well.  Apply a small amount of antibiotic ointment followed by bandage. Monitor for any signs/symptoms of infection. Call the office immediately if any occur or go directly to the emergency room. Call with any questions/concerns.

## 2024-02-02 NOTE — Progress Notes (Signed)
 Subjective: Chief Complaint  Patient presents with   RFC    RM#19 [facility]    73 year old female presents the office today for concerns of thick, discolored toenails that she cannot trim herself.  She was scheduled for ABI but it was canceled she has not been rescheduled yet.  She has the information to contact them but she said that she has not done it yet.  She does not report any fevers or chills.  Objective: AAO x3, NAD DP/PT pulses decreased bilaterally, CRT less than 3 seconds Sensation decreased  Chronic discoloration of the left forefoot. Feet are of equal temperature.  Callus left submetatarsal 1 and plantar hallux and 2nd digit without skin breakdown.  On the left foot upon debridement there was dried blood present and a thick callus and small Mehta bleeding did occur.  Superficial area of skin breakdown.  No surrounding erythema, ascending cellulitis.  No fluctuation or crepitation.  No malodor. Nails are hypertrophic, dystrophic, brittle, discolored, elongated 10. No surrounding redness or drainage. Tenderness nails 1-5 bilaterally. No open lesions or pre-ulcerative lesions are identified today. No pain with calf compression, swelling, warmth, erythema  Assessment: Symptomatic onychomycosis; PAD; neuropathy   Plan: -All treatment options discussed with the patient including all alternatives, risks, complications.  -She will be debrided nails x 10 are any complications or bleeding.  -Sharply debrided hyperkeratotic lesions x 2. - There was dried blood submetatarsal 1 on the left under the callus and once I debrided this amount of bleeding did occur.  I cleaned the area.  Antibiotic ointment is applied followed by dressing.  She is to continue with daily dressing changes as well.  Dispensed offloading pads.  Monitor for any signs or symptoms of infection.  She is not to contact vascular can be scheduled for the ABI.  Monitor for any clinical signs or symptoms of infection and directed  to call the office immediately should any occur or go to the ER.  Return in about 2 weeks (around 02/16/2024) for ulcer left foot.  Grace [name] DPM

## 2024-02-04 DIAGNOSIS — E113411 Type 2 diabetes mellitus with severe nonproliferative diabetic retinopathy with macular edema, right eye: Secondary | ICD-10-CM | POA: Diagnosis not present

## 2024-02-04 DIAGNOSIS — H35373 Puckering of macula, bilateral: Secondary | ICD-10-CM | POA: Diagnosis not present

## 2024-02-04 DIAGNOSIS — E113412 Type 2 diabetes mellitus with severe nonproliferative diabetic retinopathy with macular edema, left eye: Secondary | ICD-10-CM | POA: Diagnosis not present

## 2024-02-16 ENCOUNTER — Ambulatory Visit (INDEPENDENT_AMBULATORY_CARE_PROVIDER_SITE_OTHER): Admitting: Podiatry

## 2024-02-16 DIAGNOSIS — L84 Corns and callosities: Secondary | ICD-10-CM | POA: Diagnosis not present

## 2024-02-16 NOTE — Progress Notes (Signed)
 Subjective: Chief Complaint  Patient presents with   Foot Ulcer    RM#12 Follow up on left foot ulcer area looks to be in a good healing state not open.     73 year old female presents to the office today for follow-up evaluation of a wound on the left foot submetatarsal 1.  She states that she had some bleeding for couple days but afterwards this is resolved and the area is healed.  She has not been keeping a bandage on it.  No swelling or redness or any drainage.  No pain.  No other concerns.  Objective: AAO x3, NAD DP/PT pulses decreased bilaterally, CRT less than 3 seconds Sensation decreased  On the left foot submetatarsal 1 is a hyperkeratotic lesion with a small amount of dried blood present.  Upon debridement there is no underlying ulceration, drainage or any signs of infection.  There is no surrounding erythema.  No drainage pus.  No fluctuation or crepitation. No pain with calf compression, swelling, warmth, erythema    Assessment: Symptomatic onychomycosis; PAD; neuropathy   Plan: -All treatment options discussed with the patient including all alternatives, risks, complications.  -Sharp debrided the preulcerative callus left foot submetatarsal with any complications or bleeding.  Appears to have healed.  Recommended moisturizer and continue to keep a close watch on this.  Should there be any increasing discomfort or if there is any drainage or swelling or signs of infection let me know immediately.  Return in about 2 months (around 04/18/2024) for routine care; pre-ulcerative callus.  Grace [name] DPM

## 2024-03-08 ENCOUNTER — Other Ambulatory Visit: Payer: Self-pay | Admitting: Nurse Practitioner

## 2024-03-08 DIAGNOSIS — E1169 Type 2 diabetes mellitus with other specified complication: Secondary | ICD-10-CM

## 2024-03-08 DIAGNOSIS — E11319 Type 2 diabetes mellitus with unspecified diabetic retinopathy without macular edema: Secondary | ICD-10-CM | POA: Diagnosis not present

## 2024-03-08 DIAGNOSIS — E1142 Type 2 diabetes mellitus with diabetic polyneuropathy: Secondary | ICD-10-CM | POA: Diagnosis not present

## 2024-03-08 DIAGNOSIS — E1122 Type 2 diabetes mellitus with diabetic chronic kidney disease: Secondary | ICD-10-CM

## 2024-03-10 DIAGNOSIS — H35373 Puckering of macula, bilateral: Secondary | ICD-10-CM | POA: Diagnosis not present

## 2024-03-10 DIAGNOSIS — H02836 Dermatochalasis of left eye, unspecified eyelid: Secondary | ICD-10-CM | POA: Diagnosis not present

## 2024-03-10 DIAGNOSIS — E113412 Type 2 diabetes mellitus with severe nonproliferative diabetic retinopathy with macular edema, left eye: Secondary | ICD-10-CM | POA: Diagnosis not present

## 2024-03-10 DIAGNOSIS — H02833 Dermatochalasis of right eye, unspecified eyelid: Secondary | ICD-10-CM | POA: Diagnosis not present

## 2024-03-15 DIAGNOSIS — E875 Hyperkalemia: Secondary | ICD-10-CM | POA: Diagnosis not present

## 2024-03-17 DIAGNOSIS — H02836 Dermatochalasis of left eye, unspecified eyelid: Secondary | ICD-10-CM | POA: Diagnosis not present

## 2024-03-17 DIAGNOSIS — E113411 Type 2 diabetes mellitus with severe nonproliferative diabetic retinopathy with macular edema, right eye: Secondary | ICD-10-CM | POA: Diagnosis not present

## 2024-03-17 DIAGNOSIS — H35372 Puckering of macula, left eye: Secondary | ICD-10-CM | POA: Diagnosis not present

## 2024-03-17 DIAGNOSIS — H02833 Dermatochalasis of right eye, unspecified eyelid: Secondary | ICD-10-CM | POA: Diagnosis not present

## 2024-03-23 DIAGNOSIS — E875 Hyperkalemia: Secondary | ICD-10-CM | POA: Diagnosis not present

## 2024-04-12 NOTE — Progress Notes (Unsigned)
 Office Note   History of Present Illness   Grace [name] is a 73 y.o. (1951/06/19) female who presents for surveillance of carotid artery stenosis.   The patient returns today for follow up. He/She denies any recent CVA or TIA diagnosis. The patient also denies any recent strokelike symptoms such as slurred speech, facial droop, sudden visual changes, or sudden weakness/numbness.  Current Outpatient Medications  Medication Sig Dispense Refill   Accu-Chek Softclix Lancets lancets TEST UP TO 4 TIMES DAILY AS DIRECTED 100 each 2   acetaminophen  (TYLENOL ) 500 MG tablet Take 1,000 mg by mouth every 8 (eight) hours as needed for moderate pain.     blood glucose meter kit and supplies KIT Dispense based on patient and insurance preference. Use 3 times daily as directed. 1 each 0   Cholecalciferol  (DIALYVITE VITAMIN D  5000) 125 MCG (5000 UT) capsule Take 5,000 Units by mouth daily.     clotrimazole -betamethasone  (LOTRISONE ) cream Apply 1 Application topically 2 (two) times daily. 15 g 2   DULoxetine  (CYMBALTA ) 30 MG capsule Take 1 capsule (30 mg total) by mouth daily. 90 capsule 3   glimepiride  (AMARYL ) 2 MG tablet Take 1 tab with breakfast and if blood sugar is greater than 150 at 4 PM take one at supper 180 tablet 3   glucose blood (ACCU-CHEK GUIDE TEST) test strip Use as instructed 100 each 12   glucose blood (ACCU-CHEK GUIDE) test strip TEST BLOOD SUGAR 2 TIMES DAILY 200 strip 3   Magnesium  250 MG TABS Take 500 mg by mouth daily.     metFORMIN  (GLUCOPHAGE -XR) 500 MG 24 hr tablet TAKE 2 TABLETS BY MOUTH TWICE A DAY FOR DIABETES 360 tablet 1   Misc Natural Products (YUMVS BEET ROOT-TART CHERRY PO) Take 1 tablet by mouth daily.     zinc  gluconate 50 MG tablet Take 50 mg by mouth daily.     No current facility-administered medications for this visit.    ***REVIEW OF SYSTEMS (negative unless checked):   Cardiac:  []  Chest pain or chest pressure? []  Shortness of breath upon  activity? []  Shortness of breath when lying flat? []  Irregular heart rhythm?  Vascular:  []  Pain in calf, thigh, or hip brought on by walking? []  Pain in feet at night that wakes you up from your sleep? []  Blood clot in your veins? []  Leg swelling?  Pulmonary:  []  Oxygen at home? []  Productive cough? []  Wheezing?  Neurologic:  []  Sudden weakness in arms or legs? []  Sudden numbness in arms or legs? []  Sudden onset of difficult speaking or slurred speech? []  Temporary loss of vision in one eye? []  Problems with dizziness?  Gastrointestinal:  []  Blood in stool? []  Vomited blood?  Genitourinary:  []  Burning when urinating? []  Blood in urine?  Psychiatric:  []  Major depression  Hematologic:  []  Bleeding problems? []  Problems with blood clotting?  Dermatologic:  []  Rashes or ulcers?  Constitutional:  []  Fever or chills?  Ear/Nose/Throat:  []  Change in hearing? []  Nose bleeds? []  Sore throat?  Musculoskeletal:  []  Back pain? []  Joint pain? []  Muscle pain?   Physical Examination  ***There were no vitals filed for this visit. ***There is no height or weight on file to calculate BMI.  General:  WDWN in NAD; vital signs documented above Gait: Not observed HENT: WNL, normocephalic Pulmonary: normal non-labored breathing , without rales, rhonchi,  wheezing Cardiac: {Desc; regular/irreg:14544} HR, without murmurs {With/Without:20273} carotid bruit*** Abdomen: soft, NT, no masses Skin: {  With/Without:20273} rashes Vascular Exam/Pulses: palpable radial pulses bilaterally Extremities: {With/Without:20273} ischemic changes, {With/Without:20273} gangrene , {With/Without:20273} cellulitis; {With/Without:20273} open wounds;  Musculoskeletal: no muscle wasting or atrophy  Neurologic: A&O X 3;  No focal weakness or paresthesias are detected Psychiatric:  The pt has {Desc; normal/abnormal:11317::Normal} affect.  Non-Invasive Vascular Imaging   Bilateral Carotid  Duplex (04/14/2024):  R ICA stenosis:  {Stenosis:19197::Occluded,80-99%,60-79%,40-59%,1-39%} R VA: *** patent and antegrade L ICA stenosis:  {Stenosis:19197::Occluded,80-99%,60-79%,40-59%,1-39%} L VA: *** patent and antegrade   Medical Decision Making   Grace [name] is a 73 y.o. female who presents for surveillance of carotid artery stenosis  Based on the patient's vascular studies, their carotid artery stenosis is *** She/he denies any strokelike symptoms such as slurred speech, facial droop, sudden visual changes, or sudden weakness/numbness. No recent diagnosis of CVA or TIA. She/he has no neuro deficits on exam. There are palpable and equal radial pulses bilaterally They will continue their *** and follow up with our office in *** months/year with carotid duplex   Ahmed Holster PA-C [facility] Office: [phone]  Clinic MD: [place]

## 2024-04-14 ENCOUNTER — Other Ambulatory Visit: Payer: Self-pay | Admitting: Vascular Surgery

## 2024-04-14 ENCOUNTER — Ambulatory Visit (HOSPITAL_COMMUNITY)
Admission: RE | Admit: 2024-04-14 | Discharge: 2024-04-14 | Disposition: A | Discharge status: Home / Self Care | Source: Ambulatory Visit | Attending: Vascular Surgery | Admitting: Vascular Surgery

## 2024-04-14 ENCOUNTER — Ambulatory Visit

## 2024-04-14 VITALS — BP 129/78 | HR 78 | Temp 97.7°F | Wt 154.2 lb

## 2024-04-14 DIAGNOSIS — I739 Peripheral vascular disease, unspecified: Secondary | ICD-10-CM | POA: Insufficient documentation

## 2024-04-14 DIAGNOSIS — I6523 Occlusion and stenosis of bilateral carotid arteries: Secondary | ICD-10-CM | POA: Insufficient documentation

## 2024-04-14 LAB — VAS US ABI WITH/WO TBI
Left ABI: 0.99
Right ABI: 1.08

## 2024-04-25 DIAGNOSIS — H35371 Puckering of macula, right eye: Secondary | ICD-10-CM | POA: Diagnosis not present

## 2024-04-25 DIAGNOSIS — H35372 Puckering of macula, left eye: Secondary | ICD-10-CM | POA: Diagnosis not present

## 2024-04-25 DIAGNOSIS — E113412 Type 2 diabetes mellitus with severe nonproliferative diabetic retinopathy with macular edema, left eye: Secondary | ICD-10-CM | POA: Diagnosis not present

## 2024-04-25 DIAGNOSIS — E113411 Type 2 diabetes mellitus with severe nonproliferative diabetic retinopathy with macular edema, right eye: Secondary | ICD-10-CM | POA: Diagnosis not present

## 2024-04-28 DIAGNOSIS — H35371 Puckering of macula, right eye: Secondary | ICD-10-CM | POA: Diagnosis not present

## 2024-04-28 DIAGNOSIS — E113411 Type 2 diabetes mellitus with severe nonproliferative diabetic retinopathy with macular edema, right eye: Secondary | ICD-10-CM | POA: Diagnosis not present

## 2024-04-28 DIAGNOSIS — E113412 Type 2 diabetes mellitus with severe nonproliferative diabetic retinopathy with macular edema, left eye: Secondary | ICD-10-CM | POA: Diagnosis not present

## 2024-04-28 DIAGNOSIS — H35372 Puckering of macula, left eye: Secondary | ICD-10-CM | POA: Diagnosis not present

## 2024-05-19 ENCOUNTER — Ambulatory Visit: Admitting: Podiatry

## 2024-05-19 ENCOUNTER — Encounter: Payer: Self-pay | Admitting: Podiatry

## 2024-05-19 VITALS — Ht 64.0 in | Wt 154.2 lb

## 2024-05-19 DIAGNOSIS — L84 Corns and callosities: Secondary | ICD-10-CM | POA: Diagnosis not present

## 2024-05-19 DIAGNOSIS — M79675 Pain in left toe(s): Secondary | ICD-10-CM | POA: Diagnosis not present

## 2024-05-19 DIAGNOSIS — M79674 Pain in right toe(s): Secondary | ICD-10-CM | POA: Diagnosis not present

## 2024-05-19 DIAGNOSIS — B351 Tinea unguium: Secondary | ICD-10-CM

## 2024-05-19 DIAGNOSIS — I739 Peripheral vascular disease, unspecified: Secondary | ICD-10-CM

## 2024-05-19 NOTE — Progress Notes (Signed)
 Subjective: Chief Complaint  Patient presents with   Nail Problem    Pt is here for [facility].    73 year old female presents to the office today for follow-up evaluation of a wound on the left foot submetatarsal 1 and also for thick, elongated nails that she is not able to trim herself. No swelling, redness, drainage, or other signs of infection reported. No fevers/chills. No other concerns.   Delayne Artist PARAS, MD    Objective: AAO x3, NAD DP/PT pulses decreased bilaterally, CRT less than 3 seconds Sensation decreased  On the left foot submetatarsal 1 is a hyperkeratotic lesion with a small amount of dried blood present under the callus. The area is pre-ulcerative but no skin breakdown at this time. There is no underlying ulceration, drainage or any signs of infection.  There is no surrounding erythema.  No drainage pus.  No fluctuation or crepitation. Nails are hypertrophic, dystrophic, brittle, discolored, elongated 10. No surrounding redness or drainage. Tenderness nails 1-5 bilaterally. No pain with calf compression, swelling, warmth, erythema   Assessment: Symptomatic onychomycosis; pre-ulcerataive callus; PAD; neuropathy   Plan: -All treatment options discussed with the patient including all alternatives, risks, complications.  -Sharp debrided the preulcerative callus left foot submetatarsal with any complications or bleeding. Continue moisturizer and offloading daily -Sharply debrided the nails x 10 without any complications or bleeding.   Return in about 3 months (around 08/19/2024).  Donnice JONELLE Fees DPM

## 2024-06-06 DIAGNOSIS — H35371 Puckering of macula, right eye: Secondary | ICD-10-CM | POA: Diagnosis not present

## 2024-06-06 DIAGNOSIS — H35372 Puckering of macula, left eye: Secondary | ICD-10-CM | POA: Diagnosis not present

## 2024-06-06 DIAGNOSIS — H02833 Dermatochalasis of right eye, unspecified eyelid: Secondary | ICD-10-CM | POA: Diagnosis not present

## 2024-06-06 DIAGNOSIS — E113411 Type 2 diabetes mellitus with severe nonproliferative diabetic retinopathy with macular edema, right eye: Secondary | ICD-10-CM | POA: Diagnosis not present

## 2024-06-06 DIAGNOSIS — H02836 Dermatochalasis of left eye, unspecified eyelid: Secondary | ICD-10-CM | POA: Diagnosis not present

## 2024-06-06 DIAGNOSIS — E113412 Type 2 diabetes mellitus with severe nonproliferative diabetic retinopathy with macular edema, left eye: Secondary | ICD-10-CM | POA: Diagnosis not present

## 2024-06-08 DIAGNOSIS — E11319 Type 2 diabetes mellitus with unspecified diabetic retinopathy without macular edema: Secondary | ICD-10-CM | POA: Diagnosis not present

## 2024-06-08 DIAGNOSIS — L219 Seborrheic dermatitis, unspecified: Secondary | ICD-10-CM | POA: Diagnosis not present

## 2024-06-08 DIAGNOSIS — Z1331 Encounter for screening for depression: Secondary | ICD-10-CM | POA: Diagnosis not present

## 2024-06-08 DIAGNOSIS — Z23 Encounter for immunization: Secondary | ICD-10-CM | POA: Diagnosis not present

## 2024-06-08 DIAGNOSIS — E113413 Type 2 diabetes mellitus with severe nonproliferative diabetic retinopathy with macular edema, bilateral: Secondary | ICD-10-CM | POA: Diagnosis not present

## 2024-06-08 DIAGNOSIS — Z0000 Encounter for general adult medical examination without abnormal findings: Secondary | ICD-10-CM | POA: Diagnosis not present

## 2024-06-09 DIAGNOSIS — E113411 Type 2 diabetes mellitus with severe nonproliferative diabetic retinopathy with macular edema, right eye: Secondary | ICD-10-CM | POA: Diagnosis not present

## 2024-06-09 DIAGNOSIS — H35371 Puckering of macula, right eye: Secondary | ICD-10-CM | POA: Diagnosis not present

## 2024-06-09 DIAGNOSIS — H35372 Puckering of macula, left eye: Secondary | ICD-10-CM | POA: Diagnosis not present

## 2024-06-09 DIAGNOSIS — E113412 Type 2 diabetes mellitus with severe nonproliferative diabetic retinopathy with macular edema, left eye: Secondary | ICD-10-CM | POA: Diagnosis not present

## 2024-06-13 ENCOUNTER — Encounter: Payer: Medicare Other | Admitting: Nurse Practitioner

## 2024-07-21 DIAGNOSIS — E113411 Type 2 diabetes mellitus with severe nonproliferative diabetic retinopathy with macular edema, right eye: Secondary | ICD-10-CM | POA: Diagnosis not present

## 2024-07-21 DIAGNOSIS — H35372 Puckering of macula, left eye: Secondary | ICD-10-CM | POA: Diagnosis not present

## 2024-07-21 DIAGNOSIS — E113412 Type 2 diabetes mellitus with severe nonproliferative diabetic retinopathy with macular edema, left eye: Secondary | ICD-10-CM | POA: Diagnosis not present

## 2024-07-21 DIAGNOSIS — H35371 Puckering of macula, right eye: Secondary | ICD-10-CM | POA: Diagnosis not present

## 2024-07-21 DIAGNOSIS — H353132 Nonexudative age-related macular degeneration, bilateral, intermediate dry stage: Secondary | ICD-10-CM | POA: Diagnosis not present

## 2024-07-26 DIAGNOSIS — H353132 Nonexudative age-related macular degeneration, bilateral, intermediate dry stage: Secondary | ICD-10-CM | POA: Diagnosis not present

## 2024-07-26 DIAGNOSIS — E113412 Type 2 diabetes mellitus with severe nonproliferative diabetic retinopathy with macular edema, left eye: Secondary | ICD-10-CM | POA: Diagnosis not present

## 2024-07-26 DIAGNOSIS — H35371 Puckering of macula, right eye: Secondary | ICD-10-CM | POA: Diagnosis not present

## 2024-07-26 DIAGNOSIS — H35372 Puckering of macula, left eye: Secondary | ICD-10-CM | POA: Diagnosis not present

## 2024-07-26 DIAGNOSIS — E113411 Type 2 diabetes mellitus with severe nonproliferative diabetic retinopathy with macular edema, right eye: Secondary | ICD-10-CM | POA: Diagnosis not present

## 2024-08-19 ENCOUNTER — Ambulatory Visit (INDEPENDENT_AMBULATORY_CARE_PROVIDER_SITE_OTHER): Admitting: Podiatry

## 2024-08-19 VITALS — Ht 64.0 in | Wt 154.0 lb

## 2024-08-19 DIAGNOSIS — I739 Peripheral vascular disease, unspecified: Secondary | ICD-10-CM

## 2024-08-19 DIAGNOSIS — M79675 Pain in left toe(s): Secondary | ICD-10-CM | POA: Diagnosis not present

## 2024-08-19 DIAGNOSIS — M79674 Pain in right toe(s): Secondary | ICD-10-CM | POA: Diagnosis not present

## 2024-08-19 DIAGNOSIS — L84 Corns and callosities: Secondary | ICD-10-CM | POA: Diagnosis not present

## 2024-08-19 DIAGNOSIS — E1151 Type 2 diabetes mellitus with diabetic peripheral angiopathy without gangrene: Secondary | ICD-10-CM | POA: Diagnosis not present

## 2024-08-19 DIAGNOSIS — B351 Tinea unguium: Secondary | ICD-10-CM | POA: Diagnosis not present

## 2024-08-19 NOTE — Progress Notes (Signed)
 Subjective: Chief Complaint  Patient presents with   Diabetes    RM 11 Patient is here for diabetic foot care.    74 year old female presents to the office today for follow-up evaluation for, elongated nails that she is not able to herself.  She also has a painful callus to the ball of her left foot which is causing discomfort but she has not seen any swelling, redness or any drainage.  No open lesions.  Delayne Artist PARAS, MD Last seen: 07/08/2024  A1c 6.9 on 06/11/2024    Objective: AAO x3, NAD DP/PT pulses decreased bilaterally, CRT less than 3 seconds-she has erythema to her feet which is been chronic when her feet are down but goes away with elevation. Sensation decreased  On the left foot submetatarsal 1 is a hyperkeratotic lesion with a small amount of dried blood present under the callus.  There is no underlying ulceration, drainage or signs of infection.  There is also hyperkeratotic lesion of the plantar aspects of the first, second toes on the IPJ away from the area of the toenail.  There is no underlying ulceration but the areas are still preulcerative. Nails are hypertrophic, dystrophic, brittle, discolored, elongated 10. No surrounding redness or drainage. Tenderness nails 1-5 bilaterally. No pain with calf compression, swelling, warmth, erythema   Assessment: Symptomatic onychomycosis; pre-ulcerataive callus; PAD; neuropathy   Plan: -All treatment options discussed with the patient including all alternatives, risks, complications.  - Serpe debrided hyperkeratotic lesions x 3 without any complications or bleeding. -Sharply debrided the nails x 10 without any complications or bleeding.   Return in about 3 months (around 08/19/2024).  Donnice JONELLE Fees DPM

## 2024-11-17 ENCOUNTER — Ambulatory Visit: Admitting: Podiatry
